# Patient Record
Sex: Female | Born: 1980 | Race: White | Hispanic: No | Marital: Married
Health system: Southern US, Academic
[De-identification: ages and names within clinical notes are randomized; demographics above are authoritative.]

## PROBLEM LIST (undated history)

## (undated) ENCOUNTER — Encounter

## (undated) ENCOUNTER — Encounter: Attending: Nephrology | Primary: Nephrology

## (undated) ENCOUNTER — Ambulatory Visit

## (undated) ENCOUNTER — Telehealth

## (undated) ENCOUNTER — Telehealth: Attending: Ambulatory Care | Primary: Ambulatory Care

## (undated) ENCOUNTER — Encounter: Attending: Physician Assistant | Primary: Physician Assistant

## (undated) ENCOUNTER — Ambulatory Visit: Payer: MEDICARE

## (undated) ENCOUNTER — Ambulatory Visit: Payer: MEDICARE | Attending: Surgery | Primary: Surgery

## (undated) ENCOUNTER — Ambulatory Visit: Payer: MEDICARE | Attending: Nephrology | Primary: Nephrology

## (undated) ENCOUNTER — Telehealth: Attending: Nephrology | Primary: Nephrology

## (undated) ENCOUNTER — Telehealth: Attending: Registered" | Primary: Registered"

## (undated) ENCOUNTER — Ambulatory Visit: Payer: MEDICARE | Attending: Infectious Disease | Primary: Infectious Disease

## (undated) ENCOUNTER — Other Ambulatory Visit: Payer: MEDICARE

## (undated) ENCOUNTER — Ambulatory Visit: Payer: MEDICARE | Attending: Registered" | Primary: Registered"

## (undated) ENCOUNTER — Ambulatory Visit: Payer: MEDICARE | Attending: Urology | Primary: Urology

## (undated) DIAGNOSIS — N289 Disorder of kidney and ureter, unspecified: Secondary | ICD-10-CM

## (undated) DIAGNOSIS — H5440 Blindness, one eye, unspecified eye: Secondary | ICD-10-CM

## (undated) DIAGNOSIS — E079 Disorder of thyroid, unspecified: Secondary | ICD-10-CM

## (undated) HISTORY — PX: NEPHRECTOMY TRANSPLANTED ORGAN: SUR880

## (undated) HISTORY — PX: KIDNEY TRANSPLANT: SHX239

## (undated) HISTORY — PX: AV FISTULA PLACEMENT: SHX1204

## (undated) MED ORDER — PREDNISONE 2.5 MG TABLET: 5 mg | tablet | Freq: Every day | 11 refills | 30 days | Status: CN

## (undated) MED ORDER — PREDNISONE 2.5 MG TABLET: 5 mg | tablet | Freq: Every day | 11 refills | 30 days

## (undated) MED ORDER — ASCORBIC ACID (VITAMIN C) 500 MG TABLET: Freq: Every day | ORAL | 0 days

## (undated) MED ORDER — PRAVASTATIN 20 MG TABLET: Freq: Every day | ORAL | 0 days | Status: SS

---

## 2012-02-15 ENCOUNTER — Ambulatory Visit: Payer: Self-pay | Admitting: Family Medicine

## 2017-06-12 ENCOUNTER — Ambulatory Visit
Admission: RE | Admit: 2017-06-12 | Discharge: 2017-06-12 | Disposition: A | Discharge status: Home / Self Care | Payer: MEDICARE

## 2017-06-12 ENCOUNTER — Ambulatory Visit: Admission: RE | Admit: 2017-06-12 | Discharge: 2017-06-12 | Disposition: A | Discharge status: Home / Self Care

## 2017-06-12 DIAGNOSIS — N186 End stage renal disease: Secondary | ICD-10-CM

## 2017-06-12 DIAGNOSIS — Z7289 Other problems related to lifestyle: Secondary | ICD-10-CM

## 2017-06-12 DIAGNOSIS — Z01810 Encounter for preprocedural cardiovascular examination: Secondary | ICD-10-CM

## 2017-06-12 DIAGNOSIS — Z01818 Encounter for other preprocedural examination: Principal | ICD-10-CM

## 2017-10-27 ENCOUNTER — Ambulatory Visit (INDEPENDENT_AMBULATORY_CARE_PROVIDER_SITE_OTHER): Payer: 59

## 2017-10-27 ENCOUNTER — Encounter: Payer: Self-pay | Admitting: *Deleted

## 2017-10-27 ENCOUNTER — Ambulatory Visit
Admission: EM | Admit: 2017-10-27 | Discharge: 2017-10-27 | Disposition: A | Discharge status: Home / Self Care | Payer: 59 | Attending: Family Medicine | Admitting: Family Medicine

## 2017-10-27 ENCOUNTER — Other Ambulatory Visit: Payer: Self-pay

## 2017-10-27 DIAGNOSIS — S82831A Other fracture of upper and lower end of right fibula, initial encounter for closed fracture: Secondary | ICD-10-CM | POA: Diagnosis not present

## 2017-10-27 HISTORY — DX: Disorder of thyroid, unspecified: E07.9

## 2017-10-27 HISTORY — DX: Blindness, one eye, unspecified eye: H54.40

## 2017-10-27 HISTORY — DX: Disorder of kidney and ureter, unspecified: N28.9

## 2017-10-27 MED ORDER — HYDROCODONE-ACETAMINOPHEN 5-325 MG PO TABS: ORAL_TABLET | ORAL | 0 refills | Status: DC

## 2017-10-27 MED ORDER — ACETAMINOPHEN 500 MG PO TABS
1000.0000 mg | ORAL_TABLET | Freq: Once | ORAL | Status: AC
  Administered 2017-10-27: 1000 mg via ORAL

## 2017-10-27 NOTE — ED Provider Notes (Signed)
[facility]    CSN: [id number] Arrival date & time: 10/27/17  1756     History   Chief Complaint Chief Complaint  Patient presents with  . Ankle Pain    HPI Julie Camacho is a 37 y.o. female.   37 yo female with a c/o right ankle pain and swelling after twisting it when she tried to stand up. States can't put any weight on it now. 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   The history is provided by the patient.  Ankle Pain    Past Medical History:  Diagnosis Date  . Blind left eye   . Renal disorder   . Thyroid disease     There are no active problems to display for this patient.   Past Surgical History:  Procedure Laterality Date  . AV FISTULA PLACEMENT    . CESAREAN SECTION    . NEPHRECTOMY TRANSPLANTED ORGAN      OB History    No data available       Home Medications    Prior to Admission medications   Medication Sig Start Date End Date Taking? Authorizing Provider  calcitRIOL (ROCALTROL) 0.5 MCG capsule Take 0.5 mcg by mouth every other day.   Yes [provider]  cinacalcet (SENSIPAR) 60 MG tablet Take 60 mg by mouth daily.   Yes [provider]  epoetin alfa (EPOGEN,PROCRIT) [id number] UNIT/ML injection Inject 20,000 Units into the skin 3 (three) times a week.   Yes [provider]  heparin 6,000 Units in sodium chloride 0.9 % 500 mL Irrigate with 1 application as directed once.   Yes [provider]  midodrine (PROAMATINE) 5 MG tablet Take 5 mg by mouth 3 (three) times daily with meals.   Yes [provider]  mupirocin ointment (BACTROBAN) 2 % Place 1 application into the nose 2 (two) times daily.   Yes [provider]  omeprazole (PRILOSEC OTC) 20 MG tablet Take 20 mg by mouth daily.   Yes [provider]  sevelamer carbonate (RENVELA) 800 MG tablet Take 1,600 mg by mouth 3 (three) times daily with meals.   Yes [provider]  Thiamine HCl ([facility]  VITAMIN B-1 PO) Take by mouth.   Yes [provider]  HYDROcodone-acetaminophen (NORCO/VICODIN) 5-325 MG tablet 1-2 tabs po q 8 hours prn 10/27/17   Payton Mccallumonty, Fredrick [place], MD    Family History History reviewed. No pertinent family history.  Social History Social History   Tobacco Use  . Smoking status: Never Smoker  . Smokeless tobacco: Never Used  Substance Use Topics  . Alcohol use: No    Frequency: Never  . Drug use: No     Allergies   Demerol [meperidine] and Nsaids   Review of Systems Review of Systems   Physical Exam Triage Vital Signs ED Triage Vitals  Enc Vitals Group     BP --      Pulse Rate 10/27/17 1823 (!) 115     Resp 10/27/17 1823 16     Temp 10/27/17 1823 98 F (36.7 C)     Temp Source 10/27/17 1823 Oral     SpO2 10/27/17 1823 98 %     Weight 10/27/17 1825 169 lb 12.1 oz (77 kg)     Height 10/27/17 1825 5\' 4"  (1.626 m)     Head Circumference --      Peak Flow --      Pain Score 10/27/17 1826 5  Pain Loc --      Pain Edu? --      Excl. in GC? --    No data found.  Updated Vital Signs Pulse (!) 115   Temp 98 F (36.7 C) (Oral)   Resp 16   Ht 5\' 4"  (1.626 m)   Wt 169 lb 12.1 oz (77 kg)   LMP 10/13/2017   SpO2 98%   BMI 29.14 kg/m   Visual Acuity Right Eye Distance:   Left Eye Distance:   Bilateral Distance:    Right Eye Near:   Left Eye Near:    Bilateral Near:     Physical Exam  Constitutional: She appears well-developed and well-nourished.  Musculoskeletal: She exhibits edema.       Right ankle: She exhibits decreased range of motion and swelling. She exhibits no ecchymosis, no deformity, no laceration and normal pulse. Tenderness. Lateral malleolus and AITFL tenderness found. No CF ligament, no posterior TFL, no head of 5th metatarsal and no proximal fibula tenderness found. Achilles tendon normal.  Neurological: She is alert.  Skin: Skin is warm and dry. No rash noted. She is not diaphoretic.  Nursing note and vitals  reviewed.    UC Treatments / Results  Labs (all labs ordered are listed, but only abnormal results are displayed) Labs Reviewed - No data to display  EKG  EKG Interpretation None       Radiology Dg Ankle Complete Right  Result Date: 10/27/2017 CLINICAL DATA:  Status post twisting injury.  Pain. EXAM: RIGHT ANKLE - COMPLETE 3+ VIEW COMPARISON:  None. FINDINGS: Nondisplaced fracture of the lateral malleolus with severe overlying soft tissue swelling. No other fracture or dislocation. Intact ankle mortise. IMPRESSION: 1. Acute nondisplaced fracture of the lateral malleolus with severe overlying soft tissue swelling. Electronically Signed   By: Elige Ko   On: 10/27/2017 19:11    Procedures Procedures (including critical care time)  Medications Ordered in UC Medications  acetaminophen (TYLENOL) tablet 1,000 mg (1,000 mg Oral Given 10/27/17 1848)     Initial Impression / Assessment and Plan / UC Course  I have reviewed the triage vital signs and the nursing notes.  Pertinent labs & imaging results that were available during my care of the patient were reviewed by me and considered in my medical decision making (see chart for details).       Final Clinical Impressions(s) / UC Diagnoses   Final diagnoses:  Closed traumatic nondisplaced fracture of distal end of right fibula, initial encounter    ED Discharge Orders        Ordered    HYDROcodone-acetaminophen (NORCO/VICODIN) 5-325 MG tablet     10/27/17 1940      1. x-ray results and diagnosis reviewed with patient  2. rx as per orders above; reviewed possible side effects, interactions, risks and benefits  3. Splint applied for immobilization; walker for support as patient can not use crutches due to AV fistulas in arm 4. Recommend supportive treatment with rest, elevation 5. Follow up with orthopedist next week   Controlled Substance Prescriptions [place] Controlled Substance Registry consulted? Not Applicable     Payton Mccallum, MD 10/27/17 2047

## 2017-10-27 NOTE — ED Triage Notes (Signed)
Patient injured her right ankle today at 1545 when she tried to stand up to quickly.

## 2018-01-30 ENCOUNTER — Encounter: Admit: 2018-01-30 | Discharge: 2018-01-30 | Payer: MEDICARE | Attending: Surgery | Primary: Surgery

## 2018-02-05 DIAGNOSIS — Z01818 Encounter for other preprocedural examination: Secondary | ICD-10-CM

## 2018-02-05 DIAGNOSIS — Z7682 Awaiting organ transplant status: Principal | ICD-10-CM

## 2018-02-05 DIAGNOSIS — N186 End stage renal disease: Secondary | ICD-10-CM

## 2018-03-27 ENCOUNTER — Ambulatory Visit: Admit: 2018-03-27 | Discharge: 2018-03-28 | Disposition: A | Discharge status: Home / Self Care | Payer: MEDICARE

## 2018-04-30 ENCOUNTER — Ambulatory Visit: Admit: 2018-04-30 | Discharge: 2018-04-30 | Payer: MEDICARE

## 2018-04-30 ENCOUNTER — Ambulatory Visit: Admit: 2018-04-30 | Discharge: 2018-05-13 | Payer: MEDICARE

## 2018-04-30 DIAGNOSIS — Z01810 Encounter for preprocedural cardiovascular examination: Secondary | ICD-10-CM

## 2018-04-30 DIAGNOSIS — Z7682 Awaiting organ transplant status: Secondary | ICD-10-CM

## 2018-04-30 DIAGNOSIS — N186 End stage renal disease: Secondary | ICD-10-CM

## 2018-04-30 DIAGNOSIS — Z01818 Encounter for other preprocedural examination: Principal | ICD-10-CM

## 2018-04-30 DIAGNOSIS — R93429 Abnormal radiologic findings on diagnostic imaging of unspecified kidney: Secondary | ICD-10-CM

## 2018-04-30 DIAGNOSIS — Z7289 Other problems related to lifestyle: Secondary | ICD-10-CM

## 2018-05-14 ENCOUNTER — Ambulatory Visit: Admit: 2018-05-14 | Discharge: 2018-05-14 | Payer: MEDICARE

## 2018-05-14 ENCOUNTER — Institutional Professional Consult (permissible substitution): Admit: 2018-05-14 | Discharge: 2018-05-15 | Payer: MEDICARE

## 2018-05-14 ENCOUNTER — Institutional Professional Consult (permissible substitution): Admit: 2018-05-14 | Discharge: 2018-05-14 | Payer: MEDICARE

## 2018-05-14 DIAGNOSIS — Z01810 Encounter for preprocedural cardiovascular examination: Secondary | ICD-10-CM

## 2018-05-14 DIAGNOSIS — N186 End stage renal disease: Secondary | ICD-10-CM

## 2018-05-14 DIAGNOSIS — R93429 Abnormal radiologic findings on diagnostic imaging of unspecified kidney: Secondary | ICD-10-CM

## 2018-05-14 DIAGNOSIS — Z01818 Encounter for other preprocedural examination: Principal | ICD-10-CM

## 2018-05-14 DIAGNOSIS — Z7682 Awaiting organ transplant status: Secondary | ICD-10-CM

## 2018-05-14 DIAGNOSIS — Z7289 Other problems related to lifestyle: Secondary | ICD-10-CM

## 2018-06-28 ENCOUNTER — Other Ambulatory Visit: Payer: Self-pay | Admitting: Acute Care

## 2018-06-28 DIAGNOSIS — R29810 Facial weakness: Secondary | ICD-10-CM

## 2018-07-11 ENCOUNTER — Ambulatory Visit
Admission: RE | Admit: 2018-07-11 | Discharge: 2018-07-11 | Disposition: A | Discharge status: Home / Self Care | Payer: Medicare Other | Source: Ambulatory Visit | Attending: Acute Care | Admitting: Acute Care

## 2018-07-11 ENCOUNTER — Encounter (INDEPENDENT_AMBULATORY_CARE_PROVIDER_SITE_OTHER): Payer: Self-pay

## 2018-07-11 DIAGNOSIS — J320 Chronic maxillary sinusitis: Secondary | ICD-10-CM | POA: Diagnosis not present

## 2018-07-11 DIAGNOSIS — R29810 Facial weakness: Secondary | ICD-10-CM | POA: Insufficient documentation

## 2018-07-11 DIAGNOSIS — B9689 Other specified bacterial agents as the cause of diseases classified elsewhere: Secondary | ICD-10-CM | POA: Diagnosis not present

## 2018-10-12 ENCOUNTER — Other Ambulatory Visit: Payer: Self-pay

## 2018-10-12 ENCOUNTER — Ambulatory Visit
Admission: EM | Admit: 2018-10-12 | Discharge: 2018-10-12 | Disposition: A | Discharge status: Home / Self Care | Payer: Medicare Other | Attending: Family Medicine | Admitting: Family Medicine

## 2018-10-12 DIAGNOSIS — L089 Local infection of the skin and subcutaneous tissue, unspecified: Secondary | ICD-10-CM | POA: Insufficient documentation

## 2018-10-12 MED ORDER — MUPIROCIN 2 % EX OINT: 1.0000 "application " | TOPICAL_OINTMENT | Freq: Two times a day (BID) | CUTANEOUS | 0 refills | Status: AC

## 2018-10-12 MED ORDER — DOXYCYCLINE HYCLATE 100 MG PO CAPS: 100.0000 mg | ORAL_CAPSULE | Freq: Two times a day (BID) | ORAL | 0 refills | Status: AC

## 2018-10-12 NOTE — ED Provider Notes (Signed)
[facility]    CSN: [id number] Arrival date & time: 10/12/18  1634  History   Chief Complaint Chief Complaint  Patient presents with  . Cellulitis   HPI  37 year old female presents with a suspected finger infection.  Patient states that she suffered a paper cut approximately 1 to 2 months ago (Right ring finger). She states that she was doing fine until she injured the area on 12/10.  She states that she saw her PCP on 12/13 and was doing okay.  She states that now it appears to be infected.  It is red and tender.  There appears to be pus underneath the skin.  Mild pain, 3/10.  No known exacerbating or relieving factors.  No medications or interventions tried.  No other complaints.  History reviewed and updated as below.  Past Medical History:  Diagnosis Date  . Blind left eye   . Renal disorder   . Thyroid disease    Past Surgical History:  Procedure Laterality Date  . AV FISTULA PLACEMENT    . CESAREAN SECTION    . NEPHRECTOMY TRANSPLANTED ORGAN      OB History   No obstetric history on file.      Home Medications    Prior to Admission medications   Medication Sig Start Date End Date Taking? Authorizing Provider  calcitRIOL (ROCALTROL) 0.5 MCG capsule Take 0.5 mcg by mouth every other day.    [provider]  cinacalcet (SENSIPAR) 60 MG tablet Take 60 mg by mouth daily.    [provider]  doxycycline (VIBRAMYCIN) 100 MG capsule Take 1 capsule (100 mg total) by mouth 2 (two) times daily. 10/12/18   Tommie Samsook, Stephaine Breshears G, DO  epoetin alfa (EPOGEN,PROCRIT) [id number] UNIT/ML injection Inject 20,000 Units into the skin 3 (three) times a week.    [provider]  heparin 6,000 Units in sodium chloride 0.9 % 500 mL Irrigate with 1 application as directed once.    [provider]  midodrine (PROAMATINE) 5 MG tablet Take 5 mg by mouth 3 (three) times daily with meals.    [provider]  mupirocin ointment (BACTROBAN) 2 % Apply  1 application topically 2 (two) times daily for 7 days. 10/12/18 10/19/18  Tommie Samsook, Prudie Guthridge G, DO  omeprazole (PRILOSEC OTC) 20 MG tablet Take 20 mg by mouth daily.    [provider]  sevelamer carbonate (RENVELA) 800 MG tablet Take 1,600 mg by mouth 3 (three) times daily with meals.    [provider]  Thiamine HCl ([facility]S VITAMIN B-1 PO) Take by mouth.    [provider]   Social History Social History   Tobacco Use  . Smoking status: Never Smoker  . Smokeless tobacco: Never Used  Substance Use Topics  . Alcohol use: No    Frequency: Never  . Drug use: No     Allergies   Cefazolin; Demerol [meperidine]; Nsaids; Tolmetin; Cephalosporins; and Tape   Review of Systems Review of Systems  Constitutional: Negative for fever.  Skin:       Finger infection.   Physical Exam Triage Vital Signs ED Triage Vitals [10/12/18 1649]  Enc Vitals Group     BP (!) 98/56     Pulse Rate 98     Resp 18     Temp 98.1 F (36.7 C)     Temp Source Oral     SpO2 100 %     Weight 160 lb 15 oz (73 kg)  Height 5\' 4"  (1.626 m)     Head Circumference      Peak Flow      Pain Score 3     Pain Loc      Pain Edu?      Excl. in GC?    Updated Vital Signs BP (!) 98/56 (BP Location: Right Arm)   Pulse 98   Temp 98.1 F (36.7 C) (Oral)   Resp 18   Ht 5\' 4"  (1.626 m)   Wt 73 kg   LMP 09/24/2018   SpO2 100%   BMI 27.62 kg/m   Visual Acuity Right Eye Distance:   Left Eye Distance:   Bilateral Distance:    Right Eye Near:   Left Eye Near:    Bilateral Near:     Physical Exam Vitals signs and nursing note reviewed.  Constitutional:      General: She is not in acute distress. HENT:     Head: Normocephalic and atraumatic.     Nose: Nose normal.  Eyes:     General: No scleral icterus.    Conjunctiva/sclera: Conjunctivae normal.  Pulmonary:     Effort: Pulmonary effort is normal. No respiratory distress.  Skin:    Comments: Right 4th digit -patient with  erythema and collection of pus just proximal to the nailbed on the radial aspect.  Neurological:     Mental Status: She is alert.  Psychiatric:        Mood and Affect: Mood normal.        Behavior: Behavior normal.    UC Treatments / Results  Labs (all labs ordered are listed, but only abnormal results are displayed) Labs Reviewed - No data to display  EKG None  Radiology No results found.  Procedures Procedures (including critical care time) Needle aspiration/drainage:  Area was cleansed with Betadine. Ethyl chloride spray used for topical anesthesia.  27-gauge needle was used to puncture the pus collection.  Small amount of pus was drained.  Patient tolerated procedure well.  No complications.  Medications Ordered in UC Medications - No data to display  Initial Impression / Assessment and Plan / UC Course  I have reviewed the triage vital signs and the nursing notes.  Pertinent labs & imaging results that were available during my care of the patient were reviewed by me and considered in my medical decision making (see chart for details).    37 year old female presents with a finger infection.  Drainage was performed today.  Placing on Bactroban and doxycycline  Final Clinical Impressions(s) / UC Diagnoses   Final diagnoses:  Finger infection     Discharge Instructions     Medication as prescribed.  Take care  Dr. Adriana Simasook    ED Prescriptions    Medication Sig Dispense Auth. Provider   doxycycline (VIBRAMYCIN) 100 MG capsule Take 1 capsule (100 mg total) by mouth 2 (two) times daily. 14 capsule Teka Chanda G, DO   mupirocin ointment (BACTROBAN) 2 % Apply 1 application topically 2 (two) times daily for 7 days. 22 g Tommie Samsook, Halleigh Comes G, DO     Controlled Substance Prescriptions  Controlled Substance Registry consulted? Not Applicable   Tommie SamsCook, Nehemyah Foushee G, DO 10/12/18 1929

## 2018-10-12 NOTE — ED Triage Notes (Signed)
Pt reports a paper cut a month or two ago to right ring finger. Now feels it is infected. Pain 3/10

## 2018-10-12 NOTE — Discharge Instructions (Signed)
Medication as prescribed.  Take care  Dr. Xaivier Malay  

## 2018-12-04 DIAGNOSIS — Z7682 Awaiting organ transplant status: Secondary | ICD-10-CM

## 2018-12-04 DIAGNOSIS — N186 End stage renal disease: Principal | ICD-10-CM

## 2018-12-04 DIAGNOSIS — Z01818 Encounter for other preprocedural examination: Principal | ICD-10-CM

## 2018-12-05 ENCOUNTER — Encounter
Admit: 2018-12-05 | Discharge: 2018-12-11 | Disposition: A | Discharge status: Home / Self Care | Payer: MEDICARE | Admitting: Transplant Surgery

## 2018-12-05 ENCOUNTER — Encounter
Admit: 2018-12-05 | Discharge: 2018-12-05 | Payer: MEDICARE | Attending: Transplant Surgery | Primary: Transplant Surgery

## 2018-12-05 ENCOUNTER — Encounter
Admit: 2018-12-05 | Discharge: 2018-12-11 | Disposition: A | Discharge status: Home / Self Care | Payer: MEDICARE | Attending: Surgery | Admitting: Transplant Surgery

## 2018-12-05 ENCOUNTER — Ambulatory Visit
Admit: 2018-12-05 | Discharge: 2018-12-11 | Disposition: A | Discharge status: Home / Self Care | Payer: MEDICARE | Admitting: Transplant Surgery

## 2018-12-05 DIAGNOSIS — N186 End stage renal disease: Principal | ICD-10-CM

## 2018-12-06 DIAGNOSIS — N186 End stage renal disease: Principal | ICD-10-CM

## 2018-12-06 DIAGNOSIS — Z940 Kidney transplant status: Principal | ICD-10-CM

## 2018-12-06 LAB — BASIC METABOLIC PANEL
ANION GAP: 15 mmol/L (ref 7–15)
ANION GAP: 21 mmol/L — ABNORMAL HIGH (ref 7–15)
ANION GAP: 20 mmol/L — ABNORMAL HIGH (ref 7–15)
ANION GAP: 19 mmol/L — ABNORMAL HIGH (ref 7–15)
BLOOD UREA NITROGEN: 32 mg/dL — ABNORMAL HIGH (ref 7–21)
BLOOD UREA NITROGEN: 31 mg/dL — ABNORMAL HIGH (ref 7–21)
BUN / CREAT RATIO: 4
BUN / CREAT RATIO: 4
BUN / CREAT RATIO: 5
CALCIUM: 8.7 mg/dL (ref 8.5–10.2)
CALCIUM: 9.6 mg/dL (ref 8.5–10.2)
CALCIUM: 9.6 mg/dL (ref 8.5–10.2)
CHLORIDE: 96 mmol/L — ABNORMAL LOW (ref 98–107)
CHLORIDE: 94 mmol/L — ABNORMAL LOW (ref 98–107)
CHLORIDE: 94 mmol/L — ABNORMAL LOW (ref 98–107)
CHLORIDE: 95 mmol/L — ABNORMAL LOW (ref 98–107)
CO2: 25 mmol/L (ref 22.0–30.0)
CO2: 22 mmol/L (ref 22.0–30.0)
CO2: 20 mmol/L — ABNORMAL LOW (ref 22.0–30.0)
CREATININE: 8.57 mg/dL — ABNORMAL HIGH (ref 0.60–1.00)
CREATININE: 8.91 mg/dL — ABNORMAL HIGH (ref 0.60–1.00)
CREATININE: 9.74 mg/dL — ABNORMAL HIGH (ref 0.60–1.00)
CREATININE: 9.65 mg/dL — ABNORMAL HIGH (ref 0.60–1.00)
EGFR CKD-EPI AA FEMALE: 6 mL/min/{1.73_m2} — ABNORMAL LOW (ref >=60)
EGFR CKD-EPI AA FEMALE: 6 mL/min/{1.73_m2} — ABNORMAL LOW (ref >=60)
EGFR CKD-EPI AA FEMALE: 5 mL/min/{1.73_m2} — ABNORMAL LOW (ref >=60)
EGFR CKD-EPI AA FEMALE: 5 mL/min/{1.73_m2} — ABNORMAL LOW (ref >=60)
EGFR CKD-EPI NON-AA FEMALE: 5 mL/min/{1.73_m2} — ABNORMAL LOW (ref >=60)
EGFR CKD-EPI NON-AA FEMALE: 5 mL/min/{1.73_m2} — ABNORMAL LOW (ref >=60)
EGFR CKD-EPI NON-AA FEMALE: 5 mL/min/{1.73_m2} — ABNORMAL LOW (ref >=60)
GLUCOSE RANDOM: 147 mg/dL (ref 70–179)
GLUCOSE RANDOM: 149 mg/dL (ref 70–179)
GLUCOSE RANDOM: 127 mg/dL (ref 70–179)
POTASSIUM: 3.7 mmol/L (ref 3.5–5.0)
POTASSIUM: 3.9 mmol/L (ref 3.5–5.0)
POTASSIUM: 5 mmol/L (ref 3.5–5.0)
POTASSIUM: 5 mmol/L (ref 3.5–5.0)
SODIUM: 136 mmol/L (ref 135–145)
SODIUM: 136 mmol/L (ref 135–145)
SODIUM: 134 mmol/L — ABNORMAL LOW (ref 135–145)

## 2018-12-06 LAB — CBC
HEMATOCRIT: 27 % — ABNORMAL LOW (ref 36.0–46.0)
HEMATOCRIT: 31.1 % — ABNORMAL LOW (ref 36.0–46.0)
HEMOGLOBIN: 8.6 g/dL — ABNORMAL LOW (ref 12.0–16.0)
HEMOGLOBIN: 9.8 g/dL — ABNORMAL LOW (ref 12.0–16.0)
MEAN CORPUSCULAR HEMOGLOBIN CONC: 32 g/dL (ref 31.0–37.0)
MEAN CORPUSCULAR HEMOGLOBIN CONC: 31.5 g/dL (ref 31.0–37.0)
MEAN CORPUSCULAR HEMOGLOBIN: 30.5 pg (ref 26.0–34.0)
MEAN CORPUSCULAR HEMOGLOBIN: 29.7 pg (ref 26.0–34.0)
MEAN CORPUSCULAR VOLUME: 95.2 fL (ref 80.0–100.0)
MEAN CORPUSCULAR VOLUME: 94.2 fL (ref 80.0–100.0)
MEAN PLATELET VOLUME: 8.1 fL (ref 7.0–10.0)
MEAN PLATELET VOLUME: 7.6 fL (ref 7.0–10.0)
PLATELET COUNT: 217 10*9/L (ref 150–440)
PLATELET COUNT: 288 10*9/L (ref 150–440)
RED CELL DISTRIBUTION WIDTH: 16.1 % — ABNORMAL HIGH (ref 12.0–15.0)
RED CELL DISTRIBUTION WIDTH: 16.4 % — ABNORMAL HIGH (ref 12.0–15.0)
WBC ADJUSTED: 26.1 10*9/L — ABNORMAL HIGH (ref 4.5–11.0)
WBC ADJUSTED: 30.4 10*9/L — ABNORMAL HIGH (ref 4.5–11.0)

## 2018-12-06 LAB — RED BLOOD CELL COUNT: Lab: 3.3 — ABNORMAL LOW

## 2018-12-06 LAB — MAGNESIUM
Magnesium:MCnc:Pt:Ser/Plas:Qn:: 1.8
Magnesium:MCnc:Pt:Ser/Plas:Qn:: 2

## 2018-12-06 LAB — BUN / CREAT RATIO: Urea nitrogen/Creatinine:MRto:Pt:Ser/Plas:Qn:: 4

## 2018-12-06 LAB — TACROLIMUS, TROUGH
Lab: <1 — ABNORMAL LOW
Lab: <1 — ABNORMAL LOW

## 2018-12-06 LAB — HEMOGLOBIN A1C: ESTIMATED AVERAGE GLUCOSE: 100 mg/dL

## 2018-12-06 LAB — EGFR CKD-EPI NON-AA FEMALE
Lab: 5 — ABNORMAL LOW
Lab: 5 — ABNORMAL LOW

## 2018-12-06 LAB — PHOSPHORUS
Phosphate:MCnc:Pt:Ser/Plas:Qn:: 4.7
Phosphate:MCnc:Pt:Ser/Plas:Qn:: 6.2 — ABNORMAL HIGH

## 2018-12-06 LAB — CALCIUM IONIZED VENOUS (MG/DL)
Calcium.ionized:MCnc:Pt:Bld:Qn:: 4.44
Calcium.ionized:MCnc:Pt:Bld:Qn:: 4.56
Calcium.ionized:MCnc:Pt:Bld:Qn:: 4.59

## 2018-12-06 LAB — ESTIMATED AVERAGE GLUCOSE: Estimated average glucose:MCnc:Pt:Bld:Qn:Estimated from glycated hemoglobin: 100

## 2018-12-06 LAB — MEAN PLATELET VOLUME: Lab: 8.1

## 2018-12-06 LAB — CHLORIDE: Chloride:SCnc:Pt:Ser/Plas:Qn:: 94 — ABNORMAL LOW

## 2018-12-06 MED ORDER — VALGANCICLOVIR 450 MG TABLET
ORAL_TABLET | Freq: Every day | ORAL | 2 refills | 0 days | Status: CP
Start: 2018-12-06 — End: 2018-12-18
  Filled 2018-12-11: qty 30, 30d supply, fill #0

## 2018-12-06 MED ORDER — MYFORTIC 180 MG TABLET,DELAYED RELEASE
ORAL_TABLET | Freq: Two times a day (BID) | ORAL | 11 refills | 0.00000 days | Status: CP
Start: 2018-12-06 — End: 2019-01-03
  Filled 2018-12-11: qty 180, 30d supply, fill #0

## 2018-12-06 MED ORDER — PROGRAF 1 MG CAPSULE
ORAL_CAPSULE | Freq: Two times a day (BID) | ORAL | 11 refills | 0.00000 days | Status: CP
Start: 2018-12-06 — End: 2018-12-10

## 2018-12-06 NOTE — Unmapped (Signed)
Brief Operative Note  (CSN: [id number])      Date of Surgery: 12/05/2018    Pre-op Diagnosis: ESRD    Post-op Diagnosis: ESRD     Procedure(s):  RENAL ALLOTRANSPLANTATION, IMPLANTATION OF GRAFT; WITHOUT RECIPIENT NEPHRECTOMY: 50360 (CPT??)  BACKBNCH STD PREP CAD DONR RENAL ALLOGFT PRIOR TO TRNSPLNT, INCL DISSEC/REM PERINEPH FAT, DIAPH/RTPER ATTAC: 98119 (CPT??)  Note: Revisions to procedures should be made in chart - see Procedures activity.    Performing Service: Transplant  Surgeon(s) and Role:     * Bufford Lope, MD - Primary     * Leona Carry, MD - Assisting     * Princess Perna, MD - Resident - Assisting    Assistant: None    Findings: Successful implantation of R Donor Kidney into L Common Iliac Artery and Vein.     Anesthesia: General    Estimated Blood Loss: 100 mL    Complications: None    Specimens:   ID Type Source Tests Collected by Time Destination   1 : /MICHELS SOLUTION Biopsy Kidney, Right SURGICAL PATHOLOGY Lynnell Chad, MD 12/05/2018 2012    2 : ZERO HOUR KIDNY BX/FORMALIN/RIGHT DONOR KIDNEY Biopsy Kidney, Right SURGICAL PATHOLOGY Lynnell Chad, MD 12/05/2018 2016        Implants:   Implant Name Type Inv. Item Serial No. Manufacturer Lot No. LRB No. Used   STENT URETERAL DOUBLE J 6.0 FR/16 CM - [id number] Graft STENT URETERAL DOUBLE J 6.0 FR/16 CM  [employer] [id number] 1       Surgeon Notes: I was present and scrubbed for the entire procedure    Princess Perna   Date: 12/05/2018  Time: 10:04 PM

## 2018-12-06 NOTE — Unmapped (Signed)
Called dialysis center to let them know that patient received kidney transplant at [facility]. We do not know if the patient will require dialysis at this time and will keep them updated. Caryl Ada Inpatient Transplant Nurse Coordinator 12/06/2018 1:52 PM

## 2018-12-06 NOTE — Unmapped (Signed)
Adult Nutrition Assessment Note    Visit Type: RN Consult  Reason for Visit: Per Admission Nutrition Screen (Adult), Have you had a decrease in food intake or appetite?    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 She denies food allergies and intolerances. She does endorse having moderate acid reflux, which she took medication for prior to admission     Nutritionally Pertinent Meds: methylprednisolone, pepcid, Zofran PRN     Labs: reviewed     Abd/GI: no BM since PTA     Skin: see below   Patient Lines/Drains/Airways Status    Active Wounds     Name:   Placement date:   Placement time:   Site:   Days:    Surgical Site 12/05/18 Abdomen Left   12/05/18    1953     less than 1               Current nutrition therapy order:   Nutrition Orders          Nutrition Therapy Clear Liquid; Potassium Restricted (2 gm K) starting at 02/13 1007         Anthropometric Data:  -- Height: 162.6 cm  -- Last recorded weight: 76 kg   -- Admission weight: no admit weight   -- IBW: 120 lbs (54.5 kg)   -- Percent IBW: 139%  -- BMI: 28.7    -- Weight changes this admission:   Last 5 Recorded Weights    12/06/18 1135   Weight: 86 kg (189 lb 9.5 oz)      -- Weight history PTA: stable weight in June/July 2019, with 10 kg weight gain- which is likely partially related to fluid during surgery   Wt Readings from Last 10 Encounters:   12/06/18 86 kg (189 lb 9.5 oz)   05/14/18 76 kg (167 lb 8 oz)   03/27/18 76 kg (167 lb 8.8 oz)   06/12/17 77.8 kg (171 lb 8 oz)   02/22/17 73.9 kg (163 lb)   01/22/17 74 kg (163 lb 2.3 oz)   03/14/16 76.8 kg (169 lb 4.8 oz)   02/23/15 73.8 kg (162 lb 9.6 oz)   06/06/13 71.1 kg (156 lb 12.8 oz)   06/06/13 71.1 kg (156 lb 12.8 oz)        Daily Estimated Nutrient Needs:   Energy: 1900- 2280 kcals [25- 30 kcal/kg using last [place] weight of 76 kg]  Protein: 90- 115 gm [1.2- 1.5 g/kg using using last [place] weight of 76 kg]  Fluid: per team     Nutrition Focused Physical Exam:  Fat Areas Examined  Orbital: No loss  Upper Arm: No loss      Muscle Areas Examined  Temple: No loss    DIAGNOSIS:  Malnutrition Assessment using AND/ASPEN Clinical Characteristics:  Patient does not meet AND/ASPEN criteria for malnutrition at this time (12/06/18 1638)    Overall nutrition impression: Pt seen s/p kidney transplant 2/12. Pt reports having significant pain in abdomen/lower esophagus d/t esophageal reflux. Pt reports that they skipped her normal reflux medication yesterday and she is paying for it now. Left pt with food safety booklet along with RD contact information and food drug interaction list for pt to review prior to  education.     RECOMMENDATIONS AND INTERVENTIONS:  Monitor NPO status, medical nutrition therapy advancement and tolerance  Will provide nutrition education post transplant food safety precautions     RD Follow Up Parameters:  1-2 times per week (and more frequent as indicated)       Lanelle Bal, RD, LDN  Abdominal Transplant Dietitian   Pager: [phone]

## 2018-12-06 NOTE — Unmapped (Addendum)
Kidney Transplant Pharmacy Progress Note  Date:  2018-12-27     Patient:  Kimberly [name], Age:  38 y.o. old, Gender:  female    Allergies:  Ancef [cefazolin]; Adhesive tape-silicones; and Demerol [meperidine]    S:  POD 1, s/p deceased kidney transplant on 12-27-2018, 2/2 congenital/obstructive hydronephrosis   s/p 3 failed kidney txp (1 clotted 1995, 2 rejected 2000 + 2003, 2 removed, remaining on R),   O:  Home medications:  Medications Prior to Admission   Medication Sig Note Dispense Refill Last Dose   ??? ascorbic acid, vitamin C, (VITAMIN C) 250 MG tablet Take 250 mg by mouth daily. 12/05/2018: Taking (added)       ??? calcitriol (ROCALTROL) 0.25 MCG capsule Take 0.25 mcg by mouth daily. 12/05/2018: Taking    Taking   ??? calcium carbonate 400 mg calcium (1,000 mg) Chew Chew 2,000 mg Three (3) times a day with a meal. 12/05/2018: Not taking-NO LONGER TAKING   Not Taking at Unknown time   ??? carboxymethylcellulose sodium (REFRESH CELLUVISC) 1 % DpGe Instill drops in affected eye(s) as directed as needed. 12/05/2018: Taking (added) as needed-NO FREQUENCY REPORTED      ??? cinacalcet (SENSIPAR) 30 MG tablet Take 30 mg by mouth daily.  12/05/2018: Taking    Taking   ??? doxycycline (ADOXA) 100 MG tablet 100mg  BID (Patient not taking: Reported on 06/12/2017) 12/05/2018: Not taking-NO LONGER TAKING 60 tablet 1 Not Taking at Unknown time   ??? epoetin alfa (EPOGEN) 20,000 unit/mL injection 14,000 Units 3 (three) times a week. (on MWF at dialysis) 12/05/2018: Taking    Taking   ??? HEPARIN SODIUM,PORCINE/NS/PF (HEPARIN, PORCINE,) 1,000 unit/500 mL infusion Frequency:PHARMDIR   Dosage:5000   UNITS  Instructions:  Note:5000units with dialysis tx x6 days per week Dose: 5000UNITS 12/05/2018: GIVEN IN DIALYSIS   Taking   ??? hypromellose (GONAK) 2.5 % ophthalmic solution Apply to eye continuous as needed.       ??? lanthanum (FOSRENOL) 1000 MG chewable tablet Chew 1,000 mg Three (3) times a day with a meal. Frequency:TIDCC   Dosage:1000   MG Instructions:  Note:Dose: 1000MG  12/05/2018: Not taking-DISCONTINUED & RENVELA PRESCRIBED   Taking   ??? midodrine (PROAMATINE) 5 MG tablet Take 10 mg by mouth Three (3) times a day.  12/05/2018: Taking    Taking   ??? mupirocin (BACTROBAN) 2 % ointment Apply to affected open, crusted sores BID until resolved. 12/05/2018: Taking as needed for sores 30 g 2 Taking   ??? omeprazole (PRILOSEC) 40 MG capsule Take 40 mg by mouth daily. 12/05/2018: Taking    Taking   ??? ranitidine (ZANTAC) 150 MG tablet Take 150 mg by mouth once as needed. Frequency:PRN   Dosage:150   MG  Instructions:  Note:Dose: 150MG  12/05/2018: Not taking-NO LONGER TAKES   Not Taking at Unknown time   ??? sevelamer (RENVELA) 800 mg tablet Take 3,200 mg by mouth Three (3) times a day with a meal. 12/05/2018: Taking    Taking   ??? VIT B CMPLX NO3/FA/C/BIOT/ZINC (VIT B CMPLEX 3-FA-C-BIOT-ZN OX ORAL) Take by mouth once daily. Frequency:QD   Dosage:0.0     Instructions:  Note:Dose: 1 TAB 12/05/2018: Taking    Taking     Scheduled Medications:  famotidine, 20 mg, Daily  insulin regular, 0-12 Units, ACHS  methylPREDNISolone sodium succinate, 250 mg, Once    Followed by  [START ON 12/07/2018] methylPREDNISolone sodium succinate, 125 mg, Q24H  midodrine, 10 mg, TID  mycophenolate, 750 mg, BID  nystatin, 10 mL, Q8H SCH  pantoprazole, 40 mg, Daily  tacrolimus, 4 mg, BID  [START ON 12/08/2018] valGANciclovir, 450 mg, Tue,Sat      Continuous IV Fluids/Drips:  DOPamine      PRN Medications:  acetaminophen, 650 mg, Q6H PRN  HYDROmorphone, 0.5 mg, Q4H PRN    Or  HYDROmorphone, 1 mg, Q4H PRN  hypromellose, 1 drop, 4x Daily PRN  ondansetron, 4 mg, Q8H PRN        Hematology:   Recent Labs     12/05/18  0857 12/06/18  0409   WBC 9.9 26.1*     Chemistry:   Recent Labs     12/05/18  2215 12/06/18  0213 12/06/18  0409   BUN 30* 32* 31*   CREATININE 8.12* 8.57* 8.91*     Intake/Output:     Intake/Output Summary (Last 24 hours) at 12/06/2018 1239  Last data filed at 12/06/2018 1200  Gross per 24 hour   Intake 6032.92 ml   Output 230 ml   Net 5802.92 ml       1. Induction Immunosuppression:  Alemtuzumab.  Steroid Taper:  POD0: methylprednisolone 500 mg IV intraoperatively  POD1: methylprednisolone 250 mg IV  POD2: methylprednisolone 125 mg IV  POD3: methylprednisolone 125 mg IV     2. Prophylactic Immunosuppression:  Dual therapy with CellCept 750 mg PO BID and Prograf 4 mg PO BID.  Goal Prograf: 8-10 ng/mL    3. Prophylactic Antimicrobials:   CMV: Moderate risk for CMV D+/R+. Patient on Valcyte 450 mg PO twice weekly. Anticipate 3 months of prophylaxis per transplant protocol. End date: 03/06/19.  PJP: SMZ/TMP SS PO on Monday, Wednesday, and Friday. Anticipate 6 months of prophylaxis per transplant protocol. End date:  06/06/19  Nystatin suspension while inpatient    4. Renal:  Estimated eCrCl ~ 9 mL/min. SCr UOP x, K+  Renal US:    5. Pain:  Fentanyl PCA; gabapentin 100 mg nightly x 14 days, APAP 1 gm TID x 5 days then PRN, tramadol PRN    6. GI:  Docusate 100 mg PO BID and Miralax PRN.    7. PPX: DVT SQH 5,000 units q8hr, famotidine 20 mg daily or pantoprazole 40 mg PO daily, holding aspirin 81 mg PO daily at this time    Medication Reconciliation: Completed; Home medications not restarted: vit C, calcitriol, calcium carbonate, cinacalcet. Epogen, lanthanum, sevelamer    Spent 5 minutes completing medication therapy management, medication reconciliation, and discussing care with members of the multidisciplinary transplant team.    Thank you,  Ria Bush, PharmD

## 2018-12-06 NOTE — Unmapped (Signed)
First Assistant/ Co Surgeon note at the Attending Level  ??  Pre-op Diagnosis: esrd  ??  Post-op Diagnosis:??same  ??  Anesthesia: General  ??  Estimated Blood Loss: 100 mL  ??  Complications:??none  ??  Specimens: none  ?? ?? ?? ?? ?? ?? ??   ?? ?? ?? ?? ?? ?? ??   ?? ?? ?? ?? ?? ?? ??   ??  ??  Procedure(s) Performed: ??  1. Backbench LDD procurement,??CPT 5032_  ??  2. Renal allotransplant: CPT 50360  right??kidney ??  ??  3.  Cystotomy, with insertion of ureteral catheter or stent CPT 51045  ??  4. Ureteroneocystostomy CPT [id number]  ??  5. Insertion of indwelling stent: CPT 50605  ??  Teaching Surgeon:??A Raelene Bott, M.D.??  ??  COsurgeon:??A Norma Fredrickson, M.D.   There was no resident with adequate level of skills available for this case. Please add code .82.  I specifically helped perform the vascular anastomosi- the vein and both arteries. This case required 2 attending surgeons due to it being her 4th transplant and 2 arteries and short renal vein.   ??  ??  Procedure Details:  ??  The patient was identified in the holding area and risks and benefits of the procedure were reviewed with the patient.  ??  The patient was brought into the operating room and placed in supine position on the OR table. Prior to proceeding, a complete time out was taken and recorded in OpTime to include identification of the patient, identification of the procedure, identification of the operative site, presence of all required radiographs and/or equipment, and the timely administration of appropriate antibiotics.   ??  After the induction of GET anesthesia, lines were placed by the anesthesiologist. The urinary bladder was catheterized and irrigated with antibiotic solution. ??Sequential compression boots were used as were Bair Huggers. The abdomen was prepped and draped in the usual sterile fashion.  ??  The donor renal graft was prepared on the back table. ??The donor anatomy was 2 artery,single vein, one ureter. Right kidney.Vein had to be cut off the IVC cuff as it was incomplete from donor surgery.  ??  Lower midline incision to expose the left iliac vessels.  ??  The kidney was brought to the OR table. The patient received??no??heparin IV. Venous control was obtained by partially occluding the common iliac vein with a vascular clamp. ??A venotomy was made, the vein was irrigated, and an anastomosis was created with 6-0 Prolene. Arterial control was subsequently obtained with arterial clamps. Arteriotomy was fashioned by??4-0 punch??and the artery irrigated, and an anastomosis was created with 6-0 Prolene. This was done x 2.  ??  The Kidney vasculature was sequentially unclamped and reperfused without complications. lasix given. Positioned laterally and bladder anastomosis performed over a stent with 6-0 prolene suture.  Please see Dr . Verta Ellen note for further details.   ??  I specifically helped perform all anastomosi.  ??  DR Raelene Bott continued with the procedure  ??  Attestation:  I was present and scrubbed for the key portions of the procedure and A qualified resident physician was not available

## 2018-12-06 NOTE — Unmapped (Addendum)
Pt alert and oriented x4. Afebrile. NSR. Low BP. Systolic BP in 70s ??? 90s and diastolic BP in 30s ??? 50s. MD aware and OK with low BP if MAP greater than 55. Albumin given and STAT CBC drew. Pain managed PRN Tylenol x1. Pt remains on room air. Foley in place; no UOP; MD notified multiple times.  MD instruct to hold on 1:1 replacement and to notify any UOP. Bladder scan 0 mL @ 0715. MD instructed to forward flush 10cc. No BM. JPx1 with moderate bloody drainage. SCD on bilaterally. No new skin breakdown noted. Will continue to monitor patient's vital signs, pain level, I+Os    Problem: Wound  Goal: Optimal Wound Healing  Outcome: Progressing     Problem: Adult Inpatient Plan of Care  Goal: Plan of Care Review  Outcome: Progressing  Flowsheets (Taken 12/06/2018 0743)  Progress: improving  Plan of Care Reviewed With: patient  Goal: Patient-Specific Goal (Individualization)  Outcome: Progressing  Flowsheets (Taken 12/06/2018 0743)  Patient-Specific Goals (Include Timeframe): During hospitalization, pt's pain level will be less than 6 out of 10  Individualized Care Needs: Assessment, pain meds, reposition  Goal: Absence of Hospital-Acquired Illness or Injury  Outcome: Progressing  Intervention: Identify and Manage Fall Risk  Flowsheets (Taken 12/06/2018 0743)  Safety Interventions:  ??? aspiration precautions  ??? fall reduction program maintained  ??? low bed  Intervention: Prevent Skin Injury  Flowsheets (Taken 12/06/2018 0743)  Pressure Reduction Techniques:  ??? frequent weight shift encouraged  ??? weight shift assistance provided  Intervention: Prevent VTE (venous thromboembolism)  Flowsheets (Taken 12/06/2018 0743)  VTE Prevention/Management:  ??? bleeding precautions maintained  ??? bleeding risk factors identified  Intervention: Prevent Infection  Flowsheets (Taken 12/06/2018 0743)  Infection Prevention: handwashing promoted  Goal: Optimal Comfort and Wellbeing  Outcome: Progressing  Intervention: Monitor Pain and Promote Comfort Flowsheets (Taken 12/06/2018 0743)  Pain Management Interventions:  ??? position adjusted  ??? pillow support provided  Intervention: Provide Person-Centered Care  Flowsheets (Taken 12/06/2018 0743)  Trust Relationship/Rapport:  ??? care explained  ??? questions answered  Goal: Readiness for Transition of Care  Outcome: Progressing  Intervention: Mutually Develop Transition Plan  Flowsheets (Taken 12/06/2018 [phone])  Concerns to be Addressed: adjustment to diagnosis/illness  Goal: Rounds/Family Conference  Outcome: Progressing     Problem: Self-Care Deficit  Goal: Improved Ability to Complete Activities of Daily Living  Outcome: Progressing     Problem: Skin Injury Risk Increased  Goal: Skin Health and Integrity  Outcome: Progressing     Problem: Venous Thromboembolism  Goal: VTE (Venous Thromboembolism) Symptom Resolution  Outcome: Progressing     Problem: Pain Acute  Goal: Optimal Pain Control  Outcome: Progressing     Problem: Bleeding (Surgery Nonspecified)  Goal: Absence of Bleeding  Outcome: Progressing     Problem: Infection (Surgery Nonspecified)  Goal: Absence of Infection Signs/Symptoms  Outcome: Progressing     Problem: Pain (Surgery Nonspecified)  Goal: Acceptable Pain Control  Outcome: Progressing

## 2018-12-06 NOTE — Unmapped (Signed)
SURGERY PROGRESS NOTES      Assessment/Plan:     [name] is a 38 y.o. 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  ??  -- Dopamine infusion 18mcg/hr for renal perfusion  -- Plan for HD 2/14 if continued absent graft function  -- CLD, medlock  -- Resume home midodrine for ionotropic support  -- Renal transplant ultrasound   - POD 0 [place] = good perfusion  -- Start transplant meds (tacrolimus, valganciclovir, nystatin, cellcept)  -- Continue Methylprednisolone taper  -- Trend ionized calcium  -- SQH    Subjective:  Patient hypotensive to the 80s systolics post-op, however patient states she typically runs in the 90s systolics at home and is asymptomatic. No urine output, foley flushed without urine return, no fluid in bladder on scan.     Objective:    Vital signs in last 24 hours:  Temp:  [36.6 ??C (97.9 ??F)-37.2 ??C (99 ??F)] 36.7 ??C (98.1 ??F)  Heart Rate:  [90-118] 101  SpO2 Pulse:  [90-118] 91  Resp:  [12-26] 21  BP: (65-100)/(38-54) 87/53  MAP (mmHg):  [45-66] 61  A BP-1: (78)/(38) 78/38  MAP:  [41 mmHg] 41 mmHg  SpO2:  [89 %-100 %] 98 %    Intake/Output last 24 hours:  I/O last 3 completed shifts:  In: 5782.9 [I.V.:5282.9; IV Piggyback:500]  Out: 170 [Drains:70; Blood:100]    Physical Exam:    General Appearance:   Patient lying in bed, in no acute distress  Lungs:                Clear to auscultation bilaterally  Heart:                           Regular rate and rhythm  Abdomen:                Soft, non-tender, non-distended, surgical incision C/D/I with staples. JP drain with minimal ss output  GU:   Foley in place, flushes easily, no output  Extremities:              Warm and well perfused  Psych:    Mood and affect appropriate  Neuro:     Speech and memory intact, no focal deficits    Data Review:    I have reviewed the labs and studies from the last 24 hours.      Imaging: Radiology studies were personally reviewed    Delaine Lame, MS3

## 2018-12-06 NOTE — Unmapped (Signed)
Pre op dx- ESRD, Prior failed kidney transplant three times.  ??  Procedure(s) Performed: ?? Deceased??donor??Renal allotransplant:??Right Kidney????  ??  4. Ureteroneocystostomy CPT [id number]  ??  5. Insertion of indwelling stent: CPT 81191  ??  ??  Primary: Bufford Lope, MD  Co surgeon: Katherine Roan, MD  Assistant:??Princess Perna, MD  ??  Specimens:????kidney biopsy post reperfusion  ??  Estimated Blood Loss: ??100??cc????  ??  Backbench procurement:?? Bufford Lope, MD  During the preparation for General Surgery by Anesthesia, back table procurement was performed.????Donor blood type and UNOS ID and laterality confirmed. Kidney was inspected. excessive amount of retroperitoneal fat tissue was removed. The renal vein and renal artery were dissected from the retroperitoneal fat tissue down to the ??proximity of the hilum. Venous reconstructions:??No  Arterial reconstructions: no, 2 arteries.  The right kidney had a very short renal vein (1 cm)??and the there was no adequate IVC cuff to allow reconstruction of the same.  There were 2 donor arteries in contrast to Donor surgeon detailing of single renal artery. Lower pole artery was very small (2.13mm). Considering the vein was short, I did not have the option of using the aortic cuff on the artery, and had to cut artery shorter to perform arterial anastomosis.Lymphatic vessels were ligated.  ??  ??  Renal allotransplant:??Bufford Lope,??MD  The patient was taken back to the operating room, placed on the operating table in the supine position. General anesthesia with endotracheal intubation was started. Foley catheter??was placed.????The abdomen was prepped and draped in a sterile fashion. This was an extremely challenging case considering her prior laparotomies and failed 3 prior kidney transplants. Lower midline incision with extension to above umbilicus was done. Meticulous adhesiolysis was done. Bookwalter retractor was used to expose. The descending colon was medially mobilized. There were no anatomical planes due to multiple prior surgeries. The field was oozy due to neovascularization. The left gonadal vein was suture ligated to allow visualisation of common iliac vessels. The left common iliac artery and vein were dissected free of surrounding tissue to prepare implantation site. Satinsky clamp was applied on the left common iliac vein. Venotomy was performed with 11 blade knove and Potts scissors.????Renal vein was sutured end-to-side applying two corner sutures of 5-0 Prolene, running technique to accomplish the anastomosis. Subsequently, two straight????Pediatric Cooley clamps were applied on the ??proximal and distal left common iliac artery. 2 separate arteriotomies was performed and sutured end-to-side using 6-0 Prolene stitch. Also running technique was applied. After completion of the anastomosis??Satinsky clamp was removed first. ??The Cooley clamps were removed from the  iliac artery.  Kidney re-perfused very well, had very good turgor and was ??  pulsating. We controlled bleeding from the capsular vessels.??The patient had very soft blood pressures preoperative and intraoperatively necessitating pressors. While performing the ureter anastomisis we noted the kidney slightly underperfused. The renal vessels appeared to be spastic. We applied papaverine on vessel and the kidney colour was noted to be pink gradually. Good doppler signals were noted over the kidneyThe kidney was positioned??medially.     Kidney biopsy was performed after reperfusion. 5-0 prolene stitch was placed at biopsy site to control capsule bleeding.       Ureteroneocystostomy??and insertion of the stent: ??Bufford Lope??MD  Urinary bladder was irrigated with antibiotic containing solution. The muscularis of the bladder was transected with cautery. Bulging mucosa was entered with an 11 ??  blade knife. Prior to that the donor???s ureter was shortened, mesentery of the ureter was ligated with 3-0  silk stitch. Ureter was spatulated. Two corners of the ureter were sutured with a 6-0 PDS stitch. Bladder mucosa to ureteral mucosa. A double-J 6 French??12??stent was placed through the anastomosis site and placed into the renal pelvis of the collecting system on one side and into the bladder on the other side through the anastomosis. Running technique was applied to approximate left and right side of the uretero-neo-cystostomy. Surgicell and surgiflow was applied.  ??  The abdomen was irrigated with warm saline.????JP????drain was placed through the separate incision.??Midline fascia??was closed from two corners with #1 PDS sutures. SQ tissue was irrigated with warm saline.????Skin approximated with stapler.????Dressing was applied on JP drain exit site and staples line. The patient was taken to??PACU extubated in stable condition.  ??  I was present and scrubbed for the entire procedure, I performed the procedure and??A??qualified resident physician was not available  ??  Right deceased??donor kidney (short vessels, multiple arteries)  Sodumedrol, Campath on induction.  Midline incision, Transplantation on??left side??  Kidney made no??urine post reperfusion.  ??  ??  Bufford Lope, MD

## 2018-12-07 DIAGNOSIS — N186 End stage renal disease: Principal | ICD-10-CM

## 2018-12-07 LAB — B CHANNEL SHIFT2: Lab: 1

## 2018-12-07 LAB — CBC
HEMATOCRIT: 33.9 % — ABNORMAL LOW (ref 36.0–46.0)
MEAN CORPUSCULAR HEMOGLOBIN CONC: 31.8 g/dL (ref 31.0–37.0)
MEAN CORPUSCULAR HEMOGLOBIN: 29.7 pg (ref 26.0–34.0)
MEAN CORPUSCULAR VOLUME: 93.3 fL (ref 80.0–100.0)
MEAN PLATELET VOLUME: 7.3 fL (ref 7.0–10.0)
RED BLOOD CELL COUNT: 3.63 10*12/L — ABNORMAL LOW (ref 4.00–5.20)
RED CELL DISTRIBUTION WIDTH: 16.5 % — ABNORMAL HIGH (ref 12.0–15.0)
WBC ADJUSTED: 30.6 10*9/L — ABNORMAL HIGH (ref 4.5–11.0)

## 2018-12-07 LAB — HLA FLOW CROSSMATCH RECIPIENT
B CHANNEL SHIFT1: 10
B CHANNEL SHIFT3: 6
FLOW B CELL #1: NEGATIVE
FLOW B CELL #2: NEGATIVE
FLOW B CELL #3: NEGATIVE
FLOW T CELL #1: NEGATIVE
FLOW T CELL #2: NEGATIVE
FLOW T CELL #3: NEGATIVE
T CHANNEL SHIFT1: 14
T CHANNEL SHIFT2: 9
T CHANNEL SHIFT3: 17

## 2018-12-07 LAB — BLOOD UREA NITROGEN: Urea nitrogen:MCnc:Pt:Ser/Plas:Qn:: 34 — ABNORMAL HIGH

## 2018-12-07 LAB — BASIC METABOLIC PANEL
ANION GAP: 20 mmol/L — ABNORMAL HIGH (ref 7–15)
BLOOD UREA NITROGEN: 34 mg/dL — ABNORMAL HIGH (ref 7–21)
BUN / CREAT RATIO: 4
CALCIUM: 9.9 mg/dL (ref 8.5–10.2)
CHLORIDE: 93 mmol/L — ABNORMAL LOW (ref 98–107)
CO2: 22 mmol/L (ref 22.0–30.0)
CREATININE: 7.75 mg/dL — ABNORMAL HIGH (ref 0.60–1.00)
EGFR CKD-EPI AA FEMALE: 7 mL/min/{1.73_m2} — ABNORMAL LOW (ref >=60)
EGFR CKD-EPI NON-AA FEMALE: 6 mL/min/{1.73_m2} — ABNORMAL LOW (ref >=60)
POTASSIUM: 4.7 mmol/L (ref 3.5–5.0)

## 2018-12-07 LAB — VARICELLA ZOSTER IGG: Varicella zoster virus Ab.IgG:PrThr:Pt:Ser:Ord:: POSITIVE

## 2018-12-07 LAB — CMV IGM: Lab: NEGATIVE

## 2018-12-07 LAB — TACROLIMUS, TROUGH: Lab: 29.9

## 2018-12-07 LAB — PHOSPHORUS: Phosphate:MCnc:Pt:Ser/Plas:Qn:: 5.8 — ABNORMAL HIGH

## 2018-12-07 LAB — CALCIUM IONIZED VENOUS (MG/DL): Calcium.ionized:MCnc:Pt:Bld:Qn:: 4.82

## 2018-12-07 LAB — HEMOGLOBIN: Lab: 10.8 — ABNORMAL LOW

## 2018-12-07 LAB — EPSTEIN-BARR VCA IGG ANTIBODY: Lab: POSITIVE — AB

## 2018-12-07 LAB — MAGNESIUM: Magnesium:MCnc:Pt:Ser/Plas:Qn:: 2

## 2018-12-07 LAB — HERPES SIMPLEX VIRUS 2 IGG: Lab: NEGATIVE

## 2018-12-07 LAB — HSV ANTIBODIES, IGG: HERPES SIMPLEX VIRUS 1 IGG: NEGATIVE

## 2018-12-07 LAB — CMV IGG: Lab: POSITIVE — AB

## 2018-12-07 NOTE — Unmapped (Signed)
Paged in regards to patient's worsening resp status. CXR reviewed, worsening pulm edema as compared to earlier cxr today. Decreasing o2 sat now to low 90's. Remains oliguric. Currently on dopamine to assist with renal perfusion.  Will do 1 hrs dry UF for 1.5L and 1 hr HD for 1 L. Will assess for further dialysis needs in AM.  Discussed with primary team.    Anthony Sar, MD  Nephrology Fellow  [facility] Department of Nephrology & Hypertension

## 2018-12-07 NOTE — Unmapped (Signed)
[facility] TEACHING ROUNDS    An interdisciplinary care conference was held today and included the following team members: Heidi Dach, MD Transplant Surgeon, [facility] Surgery Resident , Drake Leach, [place] Transplant Physician Assistant, [place]ston Neighbor, RN Transplant Nurse Coordinator, Toy Care, PharmD Transplant Pharmacist, Geannie Risen, LCSW Transplant Case Manager, Lisbeth Ply, MD Transplant Nephrologist and Ernestine Conrad, MD Transplant Nephrology Fellow .    Per team, patient with minimal UOP, repeating chest x-ray today, once blood pressure management adequate without dopamine, anticipate transfer to the floor.     Discharge Plan: D/c needs TBD; patient anticipated to remain inpatient through the weekend.     Evelena Asa, LCSW  Transplant Case Manager  [facility]

## 2018-12-07 NOTE — Unmapped (Signed)
PHYSICAL THERAPY        Patient Name:  Kimberly [name]       Medical Record Number: [id number]   Date of Birth: [birth year]  Sex: Female            Treatment Diagnosis: decreased activity tolerance    ASSESSMENT  Problem List: Decreased endurance;Decreased mobility     Assessment : Kimberly [name] is a 38 y.o. 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 Pt presents to PT services with decreased endurance requiring frequent rest breaks during ambulation. Pt would benefit from ongoing acute and 3x post acute PT services to address functional limitations. After a review of the personal factors, comorbidities, clinical presentation, and examination of the number of affected body systems, the patient presents as a moderate  complexity case.     Today's Interventions: PT Eval, transfers, gait training, pt/family education re: PT role, POC, activity pacing strategies, transplant precautions                          PLAN  Planned Frequency of Treatment:  1x per day for: 4-5x week      Planned Interventions: Diaphragmatic / Pursed-lip breathing;Education - Patient;Education - Family / caregiver;Endurance activities;Functional mobility;Home exercise program;Gait training;Self-care / Home training;Stair training;Therapeutic activity;Transfer training;Therapeutic exercise    Post-Discharge Physical Therapy Recommendations:  3x weekly    PT DME Recommendations: None(has RW if needed)           Goals:   Patient and Family Goals: to be able to walk further and not need oxygen    Long Term Goal #1: Pt will score 24/24 on AMPAC in 4 weeks       SHORT GOAL #1: Pt will perform all functional transfers mod IND              Time Frame : 2 weeks  SHORT GOAL #2: Pt will ambulate 150' mod IND with LRAD              Time Frame : 2 weeks  SHORT GOAL #3: Pt will negotiate 5 steps CGA with LRAD               Time Frame : 2 weeks                                        Prognosis:  Good     Barriers to Discharge: Endurance deficits    SUBJECTIVE  Patient reports: Pt/RN agreeable to PT. I feel a lot better than last night, but my breathing still isn't great.  Current Functional Status: Session began and ended with pt sitting up in recliner, all needs in reach, Husband at bedside, RN aware of outcomes/vitals     Prior Functional Status: Pt was an independent community ambulator, no use of AD (has RW from when she fractured her foot last year)  Equipment available at home: [employer]     Past Medical History:   Diagnosis Date   ??? Bell's palsy    ??? Disease of thyroid gland    ??? ESRD (end stage renal disease) (CMS-HCC)    ???  Nonarteritic ischemic optic neuropathy    ??? Reflux nephropathy     Social History     Tobacco Use   ??? Smoking status: Never Smoker   ??? Smokeless tobacco: Never Used   Substance Use Topics   ??? Alcohol use: No      Past Surgical History:   Procedure Laterality Date   ??? AV FISTULA PLACEMENT Left     Left arm   ??? NEPHRECTOMY Bilateral    ??? PARATHYROIDECTOMY Bilateral 2010   ??? SKIN BIOPSY     ??? SKIN BIOPSY Left     left hand    Family History   Problem Relation Age of Onset   ??? Diabetes Father    ??? Kidney disease Father    ??? Cancer Maternal Grandmother    ??? Cancer Maternal Grandfather    ??? Cancer Paternal Grandmother    ??? Cancer Paternal Grandfather    ??? 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, requiring HD overnight, new O2 use  Equipment / Environment: Supplemental oxygen;Vascular access (PIV, TLC, Port-a-cath, PICC);Telemetry;JP drain(s)(3L O2 via [place])    At Rest: VSS per tele on 3L O2 via [place] with spO2 98%  With Activity: pt SOB with ambulation requiring frequent standing rest breaks, spO2 88-95% on 3L, poor pleth   Orthostatics: asymptomatic       Living Situation  Living Environment: House  Lives With: Spouse  Home Living: Two level home;Stairs to alternate level with rails;Level entry  Number of Stairs: 10     Cognition: A&Ox4          UE ROM: WFL  UE Strength: WFL  LE ROM: WFL  LE Strength: WFL                       Sensation: denied parasthesias  Balance: sitting IND, static standing mod IND with rollator         Bed Mobility: not assessed, pt up in recliner  Transfers: sit <> stand SBA with rollator   Gait  Level of Assistance: Standby assist, set-up cues, supervision of patient - no hands on  Assistive Device: Four wheel walker  Distance Ambulated (ft): 100 ft  Gait: slow cadence, steady gait;  requiring ~4 standing rest breaks due shortness of breath.  Stairs: not assessed      Endurance: decreased from baseline with frequent rest breaks and new O2 requirements    Physical Therapy Session Duration  PT Individual - Duration: 30    Medical Staff Made Aware: RN Hector Brunswick    I attest that I have reviewed the above information.  Signed: Salome Holmes, PT  Filed 12/07/2018

## 2018-12-07 NOTE — Unmapped (Signed)
Tacrolimus Therapeutic Monitoring Pharmacy Note    Kimberly [name] is a 38 y.o. female continuing tacrolimus.     Indication: Kidney transplant     Date of Transplant: 12/05/18      Prior Dosing Information: Current regimen 4 mg BID      Goals:  Therapeutic Drug Levels  Tacrolimus trough goal: 8-10 ng/mL    Additional Clinical Monitoring/Outcomes  ?? Monitor renal function (SCr and urine output) and liver function (LFTs)  ?? Monitor for signs/symptoms of adverse events (e.g., hyperglycemia, hyperkalemia, hypomagnesemia, hypertension, headache, tremor)    Results:   Tacrolimus level: 29.9 ng/mL, drawn appropriately    Pharmacokinetic Considerations and Significant Drug Interactions:  ? Concurrent hepatotoxic medications: None identified  ? Concurrent CYP3A4 substrates/inhibitors: None identified  ? Concurrent nephrotoxic medications: Bactrim    Assessment/Plan:  Recommendedation(s)  ? Hold tacrolimus and will follow-up with tacrolimus levels    Follow-up  ? Daily tac levels prior to AM dose.   ? A pharmacist will continue to monitor and recommend levels as appropriate    Please page service pharmacist with questions/clarifications.    Vertis Kelch, PharmD

## 2018-12-07 NOTE — Unmapped (Signed)
Order was placed for a PIV by Venous Access Team (VAT).  Patient was assessed for placement of a PIV. Access was obtained. Blood return noted.  Dressing intact and device well secured.  Flushed with normal saline.  Pt advised to inform RN of any s/s of discomfort at the PIV site.    [address]: poor vein access, on dopamine drip, not a PICC candidate due to ESRD. Recommend a temporary line. Thx [place]ando VAT [id number]    Text paged Dr Izora Ribas of [facility].    Workup / Procedure Time:  30 minutes      Care RN was notified.       Thank you,     Gillie Manners RN Venous Access Team

## 2018-12-07 NOTE — Unmapped (Signed)
Social Work  Psychosocial Assessment    Patient Name: Kimberly [name]   Medical Record Number: [id number]   Date of Birth: Jun 10, 1981  Sex: Female     Referral  Referred by: Care Manager  Reason for Referral: Complex Discharge Planning    Extended Emergency Contact Information  Primary Emergency Contact: Kealey,Kevin  Address: [address]           [place], [name] [address] [place] Phone: [phone]  Relation: Spouse  Secondary Emergency Contact: Wonda Cerise, [place]  Home Phone: [phone]  Mobile Phone: [phone]  Relation: Relative    Legal Next of Kin / Guardian / POA / Advance Directives      Advance Directive (Medical Treatment)  Does patient have an advance directive covering medical treatment?: Patient would not like information.  Information provided on advance directive:: No  Patient requests assistance:: No    Discharge Planning  Discharge Planning Information:   Type of Residence   Mailing Address:  [address]  [place] [facility] [address]    Medical Information   Past Medical History:   Diagnosis Date   ??? Bell's palsy    ??? Disease of thyroid gland    ??? ESRD (end stage renal disease) (CMS-HCC)    ??? Nonarteritic ischemic optic neuropathy    ??? Reflux nephropathy        Past Surgical History:   Procedure Laterality Date   ??? AV FISTULA PLACEMENT Left     Left arm   ??? NEPHRECTOMY Bilateral    ??? PARATHYROIDECTOMY Bilateral 2010   ??? SKIN BIOPSY     ??? SKIN BIOPSY Left     left hand       Family History   Problem Relation Age of Onset   ??? Diabetes Father    ??? Kidney disease Father    ??? Cancer Maternal Grandmother    ??? Cancer Maternal Grandfather    ??? Cancer Paternal Grandmother    ??? Cancer Paternal Grandfather    ??? Kidney disease Brother        Network engineer Insurance: Payor: MEDICARE / Plan: MEDICARE PART A AND PART B / Product Type: *No Product type* /    Secondary Insurance: English as a second language teacher   Prescription Coverage: Medicare D     Preferred Pharmacy: [id number] - [place], [place] - [address]  [place] [facility]    Barriers to taking medication: No    Transition Home   Transportation at time of discharge: Family/Friend's Private Vehicle    Anticipated changes related to Illness: none   Services in place prior to admission: N/A   Services anticipated for DC: TBD   Hemodialysis Prior to Admission: Yes     Readmission  Risk of Unplanned Readmission Score: UNPLANNED READMISSION SCORE: 15%  Readmitted Within the Last 30 Days?   Readmission Factors include: current reason for admission unrelated to previous admission    Social Determinants of Health  Social Determinants of Health were addressed in provider documentation.  Please refer to patient history.    Social History  Support Systems: Spouse, Family Members            Hotel manager Service: No Field seismologist Affecting Healthcare: Lawyer and Psychiatric History  Psychosocial Stressors: Denies      Psychological  Issues/Information: Comment           Comment: Past history of depression  Chemical Dependency: None              Outpatient Providers: Specialist   Name / Contact #: : [facility]  Legal: No legal issues      Ability to [employer]: No issues accessing community services      **  Met patient at bedside. She presented tired. Husband and in-laws will be caregivers.    Will continue to follow for safe d/c.    Thomasene Mohair, LCSW, CCTSW  Transplant Social Worker/Case Manager  [facility]

## 2018-12-07 NOTE — Unmapped (Signed)
SURGERY PROGRESS NOTES      Assessment/Plan:     [name] is a 38 y.o. 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  ??  -- Stop dopamine infusion, increase midodrine to 15 TID  -- Nephrology to direct hemodialysis plan -- PRN versus schedule for 2/15.   - Will remain stepdown status if any concern about needing urgent HD overnight  -- Regular diet, medlock  -- Renal transplant ultrasound   - POD0 + POD1 [place] = good perfusion  -- Start transplant meds (tacrolimus, valganciclovir, bactrim, nystatin, cellcept)  -- Continue Methylprednisolone taper  -- Stop trending ionized calcium, start q6hr k+, all other labs continue daily  -- SQH, ASA    Subjective:  Patient required bedside HD last night with 2.5L removed after deterioration of respiratory status and evidence of overload on CXR. Significant improvement in clinical status after HD. Nephro will guide further dialysis based on clinical status. Still no urine output secondary to delayed graft function, anticipate further HD requirements. Significant leukocytosis without signs of infection, likely secondary to leukocyte demargination. Calcium level stable. Will transition off of dopamine infusion and advance diet.    Objective:    Vital signs in last 24 hours:  Temp:  [36.4 ??C-37 ??C] 36.4 ??C  Heart Rate:  [75-101] 97  SpO2 Pulse:  [87-111] 97  Resp:  [14-25] 21  BP: (81-128)/(52-80) 126/76  MAP (mmHg):  [61-95] 91  SpO2:  [90 %-99 %] 98 %  BMI (Calculated):  [32.43] 32.43    Intake/Output last 24 hours:  I/O last 3 completed shifts:  In: 4835.4 [P.O.:480; I.V.:3855.4; IV Piggyback:500]  Out: 3120 [Urine:55; Drains:465; Other:2500; Blood:100]    Physical Exam:    General Appearance:   Patient lying in bed, in no acute distress  Lungs:                Clear to auscultation bilaterally, increased breath sounds bilaterally  Heart:                           Regular rate and rhythm to sinus tachycardia, MAP adequate  Abdomen:                Soft, non-tender, non-distended, surgical incision C/D/I with staples. JP drain with ss output  GU:   Foley in place, flushes easily, no output  Extremities:              Warm and well perfused  Psych:    Mood and affect appropriate  Neuro:     Speech and memory intact, no focal deficits    Data Review:    I have reviewed the labs and studies from the last 24 hours.      Imaging: Radiology studies were personally reviewed

## 2018-12-07 NOTE — Unmapped (Signed)
Patient was A&O x4 throughout shift. Remains on 2L, unable to wean. Soft BP's noted since PACU. MD ordered dopamine to control BP and UOP. Scant urine output noted this shift. Foley remains in place. No BM this shift. JP drain moderate output throughout shift. Surgical site dressing remains intact. Protective precautions maintained. Clear liquid diet. Patient ambulated in hallway 3 laps this shift, tolerated fine. No complaints of pain or discomfort throughout shift. Will continue to monitor for any acute changes.      Problem: Adult Inpatient Plan of Care  Goal: Plan of Care Review  Outcome: Progressing  Goal: Patient-Specific Goal (Individualization)  Outcome: Progressing  Flowsheets (Taken 12/06/2018 1946)  Patient-Specific Goals (Include Timeframe): Patient will ambulate 3 laps around ISCU during this shift.  Individualized Care Needs: Ambulating, promoting nutrition  Goal: Absence of Hospital-Acquired Illness or Injury  Outcome: Progressing  Goal: Optimal Comfort and Wellbeing  Outcome: Progressing  Goal: Readiness for Transition of Care  Outcome: Progressing  Goal: Rounds/Family Conference  Outcome: Progressing     Problem: Self-Care Deficit  Goal: Improved Ability to Complete Activities of Daily Living  Outcome: Progressing     Problem: Skin Injury Risk Increased  Goal: Skin Health and Integrity  Outcome: Progressing     Problem: Pain Acute  Goal: Optimal Pain Control  Outcome: Progressing     Problem: Bleeding (Surgery Nonspecified)  Goal: Absence of Bleeding  Outcome: Progressing     Problem: Infection (Surgery Nonspecified)  Goal: Absence of Infection Signs/Symptoms  Outcome: Progressing     Problem: Pain (Surgery Nonspecified)  Goal: Acceptable Pain Control  Outcome: Progressing     Problem: Venous Thromboembolism  Goal: VTE (Venous Thromboembolism) Symptom Resolution  Outcome: Progressing

## 2018-12-07 NOTE — Unmapped (Addendum)
Transplant Event  Conversation with: Website (Name), Passport (Title)    Primary Insurance Co: Medicare (Ins Co Name), on 12/05/2018 (Date)  (Time)  Secondary Insurance Co: BCBS  (Ins Co Name), on 12/05/2018  (Date)  (Time)    [place] Event Authorization No (Primary): N/R  [place] Event Authorization No (Secondary): N/R (per Dorathy Daft. at [facility] [phone])    Donor Benefits Included and Verified:Yes    [place] Event Authorization Primary Phone Number: N/R  [place] Event Authorization Secondary Phone Number:

## 2018-12-07 NOTE — Unmapped (Signed)
HEMODIALYSIS NURSE PROCEDURE NOTE    Treatment Number:  1 Room/Station:  Critical Care (Specify Unit & Room)(ISCU 6405) Procedure Date:  12/07/18   Total Treatment Time:    Min.    CONSENT:  Written consent was obtained prior to the procedure and is detailed in the medical record. Prior to the start of the procedure, a time out was taken and the identity of the patient was confirmed via name, medical record number and date of birth.     WEIGHTS:  Hemodialysis Pre-Treatment Weights     Date/Time Pre-Treatment Weight (kg) Estimated Dry Weight (kg) Patient Goal Weight (kg) Total Goal Weight (kg)    12/07/18 0120  85.7 kg (188 lb 15 oz)  ??? tbd  2.5 kg (5 lb 8.2 oz)  3.05 kg (6 lb 11.6 oz)           Hemodialysis Post Treatment Weights     Date/Time Post-Treatment Weight (kg) Treatment Weight Change (kg)    12/07/18 0400  83.2 kg (183 lb 6.8 oz)  -2.51 kg        Active Dialysis Orders (168h ago, onward)     Start     Ordered    12/06/18 2326  Dialysis Schedule Order  (Dialysis ONCE)  Once      12/06/18 2331    12/06/18 2326  Hemodialysis inpatient  (Dialysis ONCE)  Once     Comments:  1 HOUR DUF (UF 1.5L) AND 1 HOUR HD (UF 1L UF)   Question Answer Comment   K+ 2 meq/L    Ca++ 2.5 meq/L    Bicarb Other (please specify) 30   Na+ 137 meq/L    Na+ Modeling none    Dialyzer F180NR    Dialysate Temperature (C) 36    BFR-As tolerated to a maximum of: 400 mL/min    DFR 800 mL/min    Duration of treatment 2 Hr    Dry weight (kg) tbd    Challenge dry weight (kg) none    Fluid removal (L) 2.5L, see comments    Tubing Adult = 142 ml    Access Site AVF    Access Site Location Other (please specify)    Keep SBP >: 80        12/06/18 2331              ACCESS SITE:             Arteriovenous Fistula - Vein Graft  Access Arteriovenous fistula Left;Upper Arm (Active)   Site Assessment Clean;Dry;Intact 12/07/2018 12:00 AM   AV Fistula Thrill Present;Bruit Present 12/07/2018 12:00 AM   Status Deaccessed 12/07/2018 12:00 AM   Dressing Status No dressing 12/07/2018 12:00 AM   Site Condition No complications 12/07/2018 12:00 AM   Dressing Open to air (None) 12/07/2018 12:00 AM       Patient Lines/Drains/Airways Status    Active Peripheral & Central Intravenous Access     Name:   Placement date:   Placement time:   Site:   Days:    Peripheral IV 12/05/18 Anterior;Proximal;Right Antecubital   12/05/18    0901    Antecubital   1    Peripheral IV 12/05/18 Right Forearm   12/05/18    1744    Forearm   1              LAB RESULTS:  Lab Results   Component Value Date    NA 134 (L) 12/06/2018    K 5.0 12/06/2018  CL 95 (L) 12/06/2018    CO2 20.0 (L) 12/06/2018    BUN 45 (H) 12/06/2018    CALCIUM 9.6 12/06/2018    CAION 4.59 12/06/2018    PHOS 6.2 (H) 12/06/2018    MG 2.0 12/06/2018    PTH 73.8 (H) 12/05/2018     Lab Results   Component Value Date    WBC 30.4 (H) 12/06/2018    HGB 9.8 (L) 12/06/2018    HCT 31.1 (L) 12/06/2018    PLT 288 12/06/2018    PHART 7.38 12/05/2018    PO2ART 68.9 (L) 12/05/2018    PCO2ART 42.8 12/05/2018    HCO3ART 25 12/05/2018    BEART 0.1 12/05/2018    O2SATART 92.7 (L) 12/05/2018    APTT 33.3 12/05/2018    HEPBSAG Negative 01/16/2015      VITAL SIGNS:  Temperature     Date/Time Temp Temp src      12/07/18 0345  37 ??C (98.6 ??F)  Oral     12/07/18 0130  37 ??C (98.6 ??F)  Oral         Hemodynamics     Date/Time Pulse BP MAP (mmHg) Patient Position    12/07/18 0400  91  81/53  ???  ???    12/07/18 0345  79  94/62  ???  ???    12/07/18 0330  84  101/62  ???  ???    12/07/18 0315  89  92/56  ???  ???    12/07/18 0300  82  94/64  ???  ???    12/07/18 0245  92  99/66  ???  ???    12/07/18 0230  89  124/77  ???  ???    12/07/18 0215  78  99/63  ???  ???    12/07/18 0200  91  120/77  ???  ???    12/07/18 0145  98  121/75  ???  ???    12/07/18 0130  99  126/80  ???  ???          Oxygen Therapy     Date/Time Resp SpO2 O2 Device FiO2 (%) O2 Flow Rate (L/min)    12/07/18 0400  20  99 %  ??? --  ???    12/07/18 0345  21  99 %  ??? --  ???    12/07/18 0330  21  98 %  ??? --  ???    12/07/18 0315  22  96 %  ??? --  ???    12/07/18 0300  21  ???  ??? --  ???    12/07/18 0245  20  ???  ??? --  ???    12/07/18 0230  22  97 %  ??? --  ???    12/07/18 0215  19  97 %  ??? --  ???    12/07/18 0200  23  95 %  ??? --  ???    12/07/18 0145  14  93 %  ??? --  ???    12/07/18 0130  23  92 %  ??? --  ???        Oxygen Connected to Wall:  yes    Pre-Hemodialysis Assessment     Date/Time Therapy Number Dialyzer All Machine Alarms Passed Air Detector Dialysis Flow (mL/min)    12/07/18 0120  1  F-180 (98 mLs)  Yes  Engaged  800 mL/min    Date/Time Verify Priming Solution Priming Volume Hemodialysis Independent pH Hemodialysis  Machine Conductivity (mS/cm) Hemodialysis Independent Conductivity (mS/cm)    12/07/18 0120  0.9% NS  250 mL  7.1  13.8 mS/cm  13.8 mS/cm    Date/Time Bicarb Conductivity Residual Bleach Negative Free Chlorine Total Chlorine Chloramine    12/07/18 0120 --  Yes --  0 --        Pre-Hemodialysis Treatment Comments     Date/Time Pre-Hemodialysis Comments    12/07/18 0120  Pt stable, SOB, Using 15g per pt request        Hemodialysis Treatment     Date/Time Blood Flow Rate (mL/min) Arterial Pressure (mmHg) Venous Pressure (mmHg) Transmembrane Pressure (mmHg)    12/07/18 0345  ???  ???  ???  ???    12/07/18 0330  400 mL/min  -170 mmHg  200 mmHg  10 mmHg    12/07/18 0315  400 mL/min  -170 mmHg  220 mmHg  10 mmHg    12/07/18 0300  400 mL/min  -170 mmHg  220 mmHg  10 mmHg    12/07/18 0245  400 mL/min  -170 mmHg  220 mmHg  10 mmHg    12/07/18 0230  400 mL/min  -170 mmHg  220 mmHg  10 mmHg    12/07/18 0215  400 mL/min  -170 mmHg  200 mmHg  10 mmHg    12/07/18 0200  400 mL/min  -170 mmHg  200 mmHg  10 mmHg    12/07/18 0145  200 mL/min  -80 mmHg  80 mmHg  50 mmHg    Date/Time Ultrafiltration Rate (mL/hr) Ultrafiltrate Removed (mL) Dialysate Flow Rate (mL/min) KECN Linna Caprice)    12/07/18 0345  ???  3056 mL  ???  ???    12/07/18 0330  1010 mL/hr  2860 mL  800 ml/min  ???    12/07/18 0315  1010 mL/hr  2749 mL  800 ml/min  ???    12/07/18 0300  1010 mL/hr  2335 mL  800 ml/min  ???    12/07/18 0245  1010 mL/hr  2108 mL  800 ml/min  ???    12/07/18 0230  1750 mL/hr  1622 mL  ???  ???    12/07/18 0215  1750 mL/hr  1167 mL  ??? duf,seq  ???    12/07/18 0200  1750 mL/hr  632 mL  ??? duf  ???    12/07/18 0145  1750 mL/hr  0 mL  ??? seq/duf  ???        Hemodialysis Treatment Comments     Date/Time Intra-Hemodialysis Comments    12/07/18 0345  Tx completed blood  rinse back    12/07/18 0330  pt stable    12/07/18 0315  pt stable    12/07/18 0300  pt stable,     12/07/18 0245  pt stable off Duf    12/07/18 0230  pt stable    12/07/18 0215  pt stable    12/07/18 0200  pt resting    12/07/18 0145  Tx initiated wiith 15g needles        Post Treatment     Date/Time Rinseback Volume (mL) On Line Clearance: spKt/V Total Liters Processed (L/min) Dialyzer Clearance    12/07/18 0400  300 mL  ???  3051 L/min  Lightly streaked        Post Hemodialysis Treatment Comments     Date/Time Post-Hemodialysis Comments    12/07/18 0400  Report given to [facility] RN        POST TREATMENT ASSESSMENT:  General appearance:  alert  Neurological:  Grossly normal  Lungs:  clear to auscultation bilaterally  Hearts:  regular rate and rhythm, S1, S2 normal, no murmur, click, rub or gallop  Abdomen:  soft, non-tender; bowel sounds normal; no masses,  no organomegaly  Pulses:  2+ and symmetric.  Skin:  Skin color, texture, turgor normal. No rashes or lesions    Hemodialysis I/O     Date/Time Total Hemodialysis Replacement Volume (mL) Total Ultrafiltrate Output (mL)    12/07/18 0400  ???  2500 mL        [id number] - Medicaitons Given During Treatment  (last 4 hrs)         ** No medications to display **

## 2018-12-07 NOTE — Unmapped (Signed)
Shift Summary 1900-0700: Pt alert and oriented x4. Pain minimal. Oxygen requirements increased from 2L to 5L in two hours. CXR and BMP completed. SRF MD notified. Pt currently on 3L . BP stable, on dopamine gtt. Tolerating well. Received dialysis this shift, BP dropped,returned to WNL once dialysis was completed. [place] dressing in place to abdomen. JP drainage to LLQ, serosanguinous drainage. UOP low. Will continue to monitor and assess.   Problem: Adult Inpatient Plan of Care  Goal: Plan of Care Review  Outcome: Progressing  Goal: Patient-Specific Goal (Individualization)  Outcome: Progressing  Goal: Absence of Hospital-Acquired Illness or Injury  Outcome: Progressing  Goal: Optimal Comfort and Wellbeing  Outcome: Progressing  Goal: Readiness for Transition of Care  Outcome: Progressing  Goal: Rounds/Family Conference  Outcome: Progressing     Problem: Self-Care Deficit  Goal: Improved Ability to Complete Activities of Daily Living  Outcome: Progressing     Problem: Skin Injury Risk Increased  Goal: Skin Health and Integrity  Outcome: Progressing     Problem: Pain Acute  Goal: Optimal Pain Control  Outcome: Progressing     Problem: Bleeding (Surgery Nonspecified)  Goal: Absence of Bleeding  Outcome: Progressing     Problem: Infection (Surgery Nonspecified)  Goal: Absence of Infection Signs/Symptoms  Outcome: Progressing     Problem: Pain (Surgery Nonspecified)  Goal: Acceptable Pain Control  Outcome: Progressing     Problem: Venous Thromboembolism  Goal: VTE (Venous Thromboembolism) Symptom Resolution  Outcome: Progressing

## 2018-12-07 NOTE — Unmapped (Signed)
Delisted Patient from [facility] due to Kidney Transplant  on 12/06/18 at 04:05pm.

## 2018-12-08 LAB — BASIC METABOLIC PANEL
BUN / CREAT RATIO: 6
CALCIUM: 9.7 mg/dL (ref 8.5–10.2)
CHLORIDE: 89 mmol/L — ABNORMAL LOW (ref 98–107)
CO2: 21 mmol/L — ABNORMAL LOW (ref 22.0–30.0)
CREATININE: 9.33 mg/dL — ABNORMAL HIGH (ref 0.60–1.00)
EGFR CKD-EPI AA FEMALE: 6 mL/min/{1.73_m2} — ABNORMAL LOW (ref >=60)
EGFR CKD-EPI NON-AA FEMALE: 5 mL/min/{1.73_m2} — ABNORMAL LOW (ref >=60)
GLUCOSE RANDOM: 122 mg/dL (ref 70–179)
POTASSIUM: 4.7 mmol/L (ref 3.5–5.0)
SODIUM: 131 mmol/L — ABNORMAL LOW (ref 135–145)

## 2018-12-08 LAB — CBC
HEMATOCRIT: 31.1 % — ABNORMAL LOW (ref 36.0–46.0)
HEMOGLOBIN: 10 g/dL — ABNORMAL LOW (ref 12.0–16.0)
MEAN CORPUSCULAR HEMOGLOBIN CONC: 32.2 g/dL (ref 31.0–37.0)
MEAN CORPUSCULAR VOLUME: 92.8 fL (ref 80.0–100.0)
MEAN PLATELET VOLUME: 7.8 fL (ref 7.0–10.0)
PLATELET COUNT: 283 10*9/L (ref 150–440)
RED BLOOD CELL COUNT: 3.36 10*12/L — ABNORMAL LOW (ref 4.00–5.20)
RED CELL DISTRIBUTION WIDTH: 16.1 % — ABNORMAL HIGH (ref 12.0–15.0)
WBC ADJUSTED: 17.9 10*9/L — ABNORMAL HIGH (ref 4.5–11.0)

## 2018-12-08 LAB — BLOOD UREA NITROGEN: Urea nitrogen:MCnc:Pt:Ser/Plas:Qn:: 57 — ABNORMAL HIGH

## 2018-12-08 LAB — TACROLIMUS, TROUGH: Lab: 14.1

## 2018-12-08 LAB — MAGNESIUM: Magnesium:MCnc:Pt:Ser/Plas:Qn:: 2.1

## 2018-12-08 LAB — PHOSPHORUS: Phosphate:MCnc:Pt:Ser/Plas:Qn:: 8 — ABNORMAL HIGH

## 2018-12-08 LAB — MEAN CORPUSCULAR HEMOGLOBIN CONC: Lab: 32.2

## 2018-12-08 NOTE — Unmapped (Signed)
SURGERY PROGRESS NOTES      Assessment/Plan:     Ms. Annistyn Depass is a 38 y.o. 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    -- Baseline hypotension to 85/45??   - Midodrine 15 TID, MAP goal >39mmHg   - Dopamine infusion @ 3-5 POD1 only   - PRN albumin infusions with HD  -- Delayed graft function, markedly oliguric   - Renal transplant ultrasound POD0, POD1 = adequate perfusion   - Scheduled hemodialysis T-Th-Sat until improved graft function  -- Start transplant meds (valganciclovir, bactrim, nystatin, cellcept)   - Tac trough was 29.9 after 2 x 4mg  doses, holding   - Continue daily tac trough draws, resume as indicated   - Continue Methylprednisolone taper, ends 2/15  -- Daily labs with tac trough  -- Medlock, regular diet, SSI, protonix  -- SQH, ASA    Subjective:  NAEO. Delayed graft function, continues to be markedly oliguric with <42mL UOP yesterday. Otherwise doing well, VSS, stable on 2L [place] for dyspnea with exertion only, had bowel movement. Per nephro, will have HD performed in-room today, remaining in ISCU pending HD. Resolution of palpitations and anxiety after stopping dopamine infusion.    Objective:    Vital signs in last 24 hours:  Temp:  [36.5 ??C-36.8 ??C] 36.5 ??C  Heart Rate:  [76-85] 76  SpO2 Pulse:  [75-91] 75  Resp:  [20-25] 21  BP: (81-117)/(47-75) 99/66  MAP (mmHg):  [56-89] 76  SpO2:  [94 %-99 %] 94 %    Intake/Output last 24 hours:  I/O last 3 completed shifts:  In: 740.2 [P.O.:580; I.V.:160.2]  Out: 2910 [Urine:80; Drains:330; Other:2500]    Physical Exam:    General Appearance:   Patient lying in bed, in no acute distress  Lungs:                Clear to auscultation bilaterally  Heart:                           Regular rate and rhythm, MAP adequate >55, HDS  Abdomen:                Soft, non-tender, non-distended, surgical incision C/D/I with staples. JP drains x3 with ss output  GU:   Foley in place, flushes easily, no output  Extremities:              Warm and well perfused  Psych:    Mood and affect appropriate  Neuro:     Speech and memory intact, no focal deficits    Data Review:    I have reviewed the labs and studies from the last 24 hours.      Imaging: Radiology studies were personally reviewed

## 2018-12-08 NOTE — Unmapped (Signed)
Tacrolimus Therapeutic Monitoring Pharmacy Note    Kimberly [name] is a 38 y.o. female continuing tacrolimus.     Indication: Kidney transplant     Date of Transplant: 12/05/18      Prior Dosing Information: HOLDING current regimen 4 mg BID     Goals:  Therapeutic Drug Levels  Tacrolimus trough goal: 8-10 ng/mL    Additional Clinical Monitoring/Outcomes  ?? Monitor renal function (SCr and urine output) and liver function (LFTs)  ?? Monitor for signs/symptoms of adverse events (e.g., hyperglycemia, hyperkalemia, hypomagnesemia, hypertension, headache, tremor)    Results:   Tacrolimus level: 14.4 ng/mL, drawn appropriately    Pharmacokinetic Considerations and Significant Drug Interactions:  ? Concurrent hepatotoxic medications: None identified  ? Concurrent CYP3A4 substrates/inhibitors: None identified  ? Concurrent nephrotoxic medications: Bactrim    Assessment/Plan:  Recommendedation(s)  ? Continue to HOLD tacrolimus and will follow-up with tacrolimus levels    Follow-up  ? Daily tac levels prior to AM dose.   ? A pharmacist will continue to monitor and recommend levels as appropriate    Please page service pharmacist with questions/clarifications.    Ladon Applebaum, PharmD

## 2018-12-08 NOTE — Unmapped (Addendum)
Patient Kimberly [name] is hemodynamically stable on 2L [place]. Continues to experience dyspnea with exertion. Foley intact with continued low urine output. JP drain intact to L abdomen with serosanguinous output. Has ordered one meal thus far. Drinking small amounts. Has been experiencing discomfort 2/2 acid reflux. Calcium carbonate ordered; reported some relief with first admin. Has ambulated twos laps thus far and is currently sitting in the chair. Performed bath independently in bathroom. Goal is to ambulate once more today. Per dialysis unit, dialysis will be performed during night shift. Will continue to monitor closely.    Problem: Adult Inpatient Plan of Care  Goal: Plan of Care Review  Outcome: Progressing  Goal: Patient-Specific Goal (Individualization)  Outcome: Progressing  Flowsheets (Taken 12/08/2018 1425)  Individualized Care Needs: BP monitoring (soft BPs requiring midodrine). Oxygen monitoring (2L [place], dyspnea with exertion). UOP monitoring (delayed graft function w/ low uop requiring dialysis PRN).     Problem: Self-Care Deficit  Goal: Improved Ability to Complete Activities of Daily Living  Outcome: Progressing     Problem: Skin Injury Risk Increased  Goal: Skin Health and Integrity  Outcome: Progressing     Problem: Venous Thromboembolism  Goal: VTE (Venous Thromboembolism) Symptom Resolution  Outcome: Progressing     Problem: Pain Acute  Goal: Optimal Pain Control  Outcome: Progressing     Problem: Bleeding (Surgery Nonspecified)  Goal: Absence of Bleeding  Outcome: Progressing     Problem: Infection (Surgery Nonspecified)  Goal: Absence of Infection Signs/Symptoms  Outcome: Progressing     Problem: Pain (Surgery Nonspecified)  Goal: Acceptable Pain Control  Outcome: Progressing

## 2018-12-08 NOTE — Unmapped (Addendum)
Afebrile. HR SR. Hypotensive w/ SBP 80s but asymptomatic (SRF notified). Weaned from 4 to 2L [place]. Pt has DOE. Doing IS independently w/ good effort. No c/o pain. JP remains in place w/ moderate serosanguinous output. Foley in place w/ scant amount of brown UOP (30 ml this shift). Dopamine infusion stopped per order. Pt advanced to regular diet today and tolerating w/o difficulty. No BM yet. OOB and ambulating short distances (d/t DOE). SCDs in place. Sat up in bedside chair majority of shift. Pt's family member at bedside and updated on POC. Will continue to monitor. Kimberly [name]     Problem: Adult Inpatient Plan of Care  Goal: Plan of Care Review  Outcome: Progressing  Flowsheets (Taken 12/07/2018 1640)  Progress: improving  Plan of Care Reviewed With:  ??? patient  ??? family  Goal: Patient-Specific Goal (Individualization)  Outcome: Progressing  Flowsheets (Taken 12/07/2018 1640)  Patient-Specific Goals (Include Timeframe): pt will have increased UOP by 12/09/18  Individualized Care Needs: pt prefers to go by [employer]  Goal: Absence of Hospital-Acquired Illness or Injury  Outcome: Progressing  Goal: Optimal Comfort and Wellbeing  Outcome: Progressing  Goal: Readiness for Transition of Care  Outcome: Progressing  Goal: Rounds/Family Conference  Outcome: Progressing  Flowsheets (Taken 12/07/2018 1640)  Clinical EDD (Estimated Discharge Date): 12/14/2018  Participants:  ??? case manager  ??? nursing     Problem: Self-Care Deficit  Goal: Improved Ability to Complete Activities of Daily Living  Outcome: Progressing     Problem: Skin Injury Risk Increased  Goal: Skin Health and Integrity  Outcome: Progressing     Problem: Venous Thromboembolism  Goal: VTE (Venous Thromboembolism) Symptom Resolution  Outcome: Progressing     Problem: Pain Acute  Goal: Optimal Pain Control  Outcome: Progressing     Problem: Bleeding (Surgery Nonspecified)  Goal: Absence of Bleeding  Outcome: Progressing     Problem: Infection (Surgery Nonspecified)  Goal: Absence of Infection Signs/Symptoms  Outcome: Progressing     Problem: Pain (Surgery Nonspecified)  Goal: Acceptable Pain Control  Outcome: Progressing

## 2018-12-08 NOTE — Unmapped (Signed)
Shift Summary 1900-0700: Pt alert and oriented x4. Denies pain. Remains on 2L [place]. Tolerating well. BP stable this shift. Abdominal incision intact. No s/sx of infection noted. JP drainage to LLQ, serosanguinous drainage. UOP minimal. POC: Pt to receive HD at the bedside. Will continue to monitor and assess.   Problem: Adult Inpatient Plan of Care  Goal: Plan of Care Review  Outcome: Progressing  Goal: Patient-Specific Goal (Individualization)  Outcome: Progressing  Goal: Absence of Hospital-Acquired Illness or Injury  Outcome: Progressing  Goal: Optimal Comfort and Wellbeing  Outcome: Progressing  Goal: Readiness for Transition of Care  Outcome: Progressing  Goal: Rounds/Family Conference  Outcome: Progressing     Problem: Self-Care Deficit  Goal: Improved Ability to Complete Activities of Daily Living  Outcome: Progressing     Problem: Skin Injury Risk Increased  Goal: Skin Health and Integrity  Outcome: Progressing     Problem: Pain Acute  Goal: Optimal Pain Control  Outcome: Progressing     Problem: Bleeding (Surgery Nonspecified)  Goal: Absence of Bleeding  Outcome: Progressing     Problem: Infection (Surgery Nonspecified)  Goal: Absence of Infection Signs/Symptoms  Outcome: Progressing     Problem: Pain (Surgery Nonspecified)  Goal: Acceptable Pain Control  Outcome: Progressing     Problem: Venous Thromboembolism  Goal: VTE (Venous Thromboembolism) Symptom Resolution  Outcome: Progressing     Problem: Adult Inpatient Plan of Care  Goal: Plan of Care Review  Outcome: Progressing  Goal: Patient-Specific Goal (Individualization)  Outcome: Progressing  Goal: Absence of Hospital-Acquired Illness or Injury  Outcome: Progressing  Goal: Optimal Comfort and Wellbeing  Outcome: Progressing  Goal: Readiness for Transition of Care  Outcome: Progressing  Goal: Rounds/Family Conference  Outcome: Progressing     Problem: Self-Care Deficit  Goal: Improved Ability to Complete Activities of Daily Living  Outcome: Progressing Problem: Skin Injury Risk Increased  Goal: Skin Health and Integrity  Outcome: Progressing     Problem: Pain Acute  Goal: Optimal Pain Control  Outcome: Progressing     Problem: Bleeding (Surgery Nonspecified)  Goal: Absence of Bleeding  Outcome: Progressing     Problem: Infection (Surgery Nonspecified)  Goal: Absence of Infection Signs/Symptoms  Outcome: Progressing     Problem: Pain (Surgery Nonspecified)  Goal: Acceptable Pain Control  Outcome: Progressing     Problem: Venous Thromboembolism  Goal: VTE (Venous Thromboembolism) Symptom Resolution  Outcome: Progressing

## 2018-12-09 DIAGNOSIS — N186 End stage renal disease: Principal | ICD-10-CM

## 2018-12-09 LAB — CBC
HEMATOCRIT: 33.1 % — ABNORMAL LOW (ref 36.0–46.0)
HEMOGLOBIN: 10.4 g/dL — ABNORMAL LOW (ref 12.0–16.0)
MEAN CORPUSCULAR HEMOGLOBIN CONC: 31.4 g/dL (ref 31.0–37.0)
MEAN CORPUSCULAR HEMOGLOBIN: 28.9 pg (ref 26.0–34.0)
MEAN CORPUSCULAR VOLUME: 92.2 fL (ref 80.0–100.0)
PLATELET COUNT: 397 10*9/L (ref 150–440)
RED BLOOD CELL COUNT: 3.59 10*12/L — ABNORMAL LOW (ref 4.00–5.20)
WBC ADJUSTED: 12.4 10*9/L — ABNORMAL HIGH (ref 4.5–11.0)

## 2018-12-09 LAB — TACROLIMUS, TROUGH: Lab: 11.6

## 2018-12-09 LAB — MAGNESIUM: Magnesium:MCnc:Pt:Ser/Plas:Qn:: 2.1

## 2018-12-09 LAB — BASIC METABOLIC PANEL
ANION GAP: 14 mmol/L (ref 7–15)
BUN / CREAT RATIO: 5
CALCIUM: 10.2 mg/dL (ref 8.5–10.2)
CHLORIDE: 99 mmol/L (ref 98–107)
CO2: 23 mmol/L (ref 22.0–30.0)
EGFR CKD-EPI AA FEMALE: 15 mL/min/{1.73_m2} — ABNORMAL LOW (ref >=60)
EGFR CKD-EPI NON-AA FEMALE: 13 mL/min/{1.73_m2} — ABNORMAL LOW (ref >=60)
POTASSIUM: 4.3 mmol/L (ref 3.5–5.0)
SODIUM: 136 mmol/L (ref 135–145)

## 2018-12-09 LAB — MEAN CORPUSCULAR HEMOGLOBIN CONC: Lab: 31.4

## 2018-12-09 LAB — POTASSIUM: Potassium:SCnc:Pt:Ser/Plas:Qn:: 4.3

## 2018-12-09 LAB — PHOSPHORUS: Phosphate:MCnc:Pt:Ser/Plas:Qn:: 4.9 — ABNORMAL HIGH

## 2018-12-09 NOTE — Unmapped (Signed)
Shift summary 7A- transfer to 5W: Pt AOX4, soft BPs but within call parameters, NSR, on 2L [place], trying to wean, afebrile. No complaints of pain. SCDs on bilaterally. Pt can turn self; no new skin breakdown noted. Pt ambulated in room. Foley w/almost no UOP; MDs are aware. No BM this shift; pt states she is passing gas. Pt transferred to 5W. Will continue to monitor and assess.      Problem: Adult Inpatient Plan of Care  Goal: Plan of Care Review  Outcome: Progressing  Flowsheets (Taken 12/09/2018 1235)  Progress: improving  Plan of Care Reviewed With: patient  Goal: Patient-Specific Goal (Individualization)  Outcome: Progressing  Flowsheets (Taken 12/09/2018 1235)  Patient-Specific Goals (Include Timeframe): Pt will ambulate during the shift  Goal: Absence of Hospital-Acquired Illness or Injury  Outcome: Progressing  Goal: Optimal Comfort and Wellbeing  Outcome: Progressing  Goal: Readiness for Transition of Care  Outcome: Progressing  Goal: Rounds/Family Conference  Outcome: Progressing     Problem: Self-Care Deficit  Goal: Improved Ability to Complete Activities of Daily Living  Outcome: Progressing     Problem: Skin Injury Risk Increased  Goal: Skin Health and Integrity  Outcome: Progressing     Problem: Pain Acute  Goal: Optimal Pain Control  Outcome: Progressing     Problem: Bleeding (Surgery Nonspecified)  Goal: Absence of Bleeding  Outcome: Progressing     Problem: Infection (Surgery Nonspecified)  Goal: Absence of Infection Signs/Symptoms  Outcome: Progressing     Problem: Pain (Surgery Nonspecified)  Goal: Acceptable Pain Control  Outcome: Progressing  Intervention: Prevent or Manage Pain  Flowsheets (Taken 12/09/2018 1235)  Pain Management Interventions: pillow support provided     Problem: Venous Thromboembolism  Goal: VTE (Venous Thromboembolism) Symptom Resolution  Outcome: Progressing  Intervention: Prevent or Manage VTE (Venous Thromboembolism)  Flowsheets (Taken 12/09/2018 1235)  Bleeding Precautions: blood pressure closely monitored  VTE Prevention/Management:   ambulation promoted   bleeding precautions maintained  Anti-Embolism Device Type: SCD, Knee  Anti-Embolism Intervention: On  Anti-Embolism Device Location: BLE

## 2018-12-09 NOTE — Unmapped (Signed)
SURGERY PROGRESS NOTES      Assessment/Plan:     [name] is a 38 y.o. 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    -- Baseline hypotension to 85/45??   - Midodrine 15 TID, MAP goal >26mmHg   - PRN albumin infusions with HD  -- Delayed graft function, markedly oliguric   - Renal transplant ultrasound POD0, POD1 = adequate perfusion   - Scheduled hemodialysis T-Th-Sat until improved graft function  -- Start transplant meds (valganciclovir, bactrim, nystatin, cellcept)   - Continue daily tac trough draws, resume 2mg  BID   - Solumedrol taper ended POD3  -- Daily labs with tac trough  -- [place], regular diet, SSI, protonix, famotidine, tums, mucositis mix  -- SQH, ASA    -- Dispo: Floor  PT: 3x    Subjective:  NAEO. Delayed graft function, continues to be markedly oliguric with <82mL UOP daily. Otherwise doing well, VSS, stable on 2L [place] for comfort only, had bowel movement. C/o some reflux, has baseline reflux at home, no evidence of ileus. Had HD on-unit last night, now appropriate for floor status.    Objective:    Vital signs in last 24 hours:  Temp:  [36.4 ??C-37 ??C] 36.6 ??C  Heart Rate:  [57-91] 67  SpO2 Pulse:  [70-76] 73  Resp:  [15-23] 21  BP: (91-140)/(57-89) 91/61  MAP (mmHg):  [70-100] 70  SpO2:  [97 %-100 %] 99 %    Intake/Output last 24 hours:  I/O last 3 completed shifts:  In: 1150 [P.O.:600; Other:550]  Out: 3307 [Urine:57; Drains:200; Other:3050]    Physical Exam:    General Appearance:   Patient lying in bed, in no acute distress  Lungs:                Clear to auscultation bilaterally  Heart:                           Regular rate and rhythm, MAP adequate >55, HDS  Abdomen:                Soft, non-tender, non-distended, surgical incision C/D/I with staples. JP drain with ss output  GU:   Foley in place, flushes easily, no output  Extremities:              Warm and well perfused, no edema  Psych:    Mood and affect appropriate  Neuro:     Speech and memory intact, no focal deficits    Data Review:    I have reviewed the labs and studies from the last 24 hours.    Imaging: None

## 2018-12-09 NOTE — Unmapped (Signed)
Order was placed for a PIV by Venous Access Team (VAT).  Patient was assessed for placement of a PIV. Access was obtained. Blood return noted.  Dressing intact and device well secured.  Flushed with normal saline.  Pt advised to inform RN of any s/s of discomfort at the PIV site.    Workup / Procedure Time:  15 minutes      Primary RN was notified.       Thank you,     Iantha Fallen RN Venous Access Team

## 2018-12-09 NOTE — Unmapped (Signed)
HEMODIALYSIS NURSE PROCEDURE NOTE    Treatment Number:  2 Room/Station:  (9147) Procedure Date:  12/09/18   Total Treatment Time:  212 Min.    CONSENT:  Written consent was obtained prior to the procedure and is detailed in the medical record. Prior to the start of the procedure, a time out was taken and the identity of the patient was confirmed via name, medical record number and date of birth.     WEIGHTS:  Hemodialysis Pre-Treatment Weights     Date/Time Pre-Treatment Weight (kg) Estimated Dry Weight (kg) Patient Goal Weight (kg) Total Goal Weight (kg)    12/09/18 0045  78.4 kg (172 lb 13.5 oz)  ???  2.5 kg (5 lb 8.2 oz)  3.05 kg (6 lb 11.6 oz)           Active Dialysis Orders (168h ago, onward)     Start     Ordered    12/07/18 1742  Hemodialysis inpatient  Every Tue,Thu,Sat     Question Answer Comment   K+ 2 meq/L    Ca++ 2.5 meq/L    Bicarb Other (please specify) 30   Na+ 137 meq/L    Na+ Modeling none    Dialyzer [id number]    Dialysate Temperature (C) 36    BFR-As tolerated to a maximum of: 400 mL/min    DFR 800 mL/min    Duration of treatment 3.5 Hr    Dry weight (kg) tbd    Challenge dry weight (kg) none    Fluid removal (L) 2.5L on 12/08/18    Tubing Adult = 142 ml    Access Site AVF    Access Site Location Other (please specify)    Keep SBP >: 80        12/07/18 1741    12/06/18 2326  Dialysis Schedule Order  (Dialysis ONCE)  Once      12/06/18 2331              ACCESS SITE:             Arteriovenous Fistula - Vein Graft  Access Arteriovenous fistula Left;Upper Arm (Active)   Site Assessment Clean;Bleeding 12/09/2018  4:45 AM   AV Fistula Thrill Present;Bruit Present 12/09/2018  4:45 AM   Status Deaccessed 12/09/2018  4:45 AM   Dressing Intervention Dressing changed 12/09/2018  4:45 AM   Dressing Status      Clean;Dry;Changed 12/09/2018  4:45 AM   Site Condition No complications;Bleeding 12/09/2018  4:45 AM   Dressing Other (Comment);Gauze 12/09/2018  4:45 AM   Dressing To Be Removed (Date/Time) <24 per RN discretion 12/09/2018  4:45 AM       Patient Lines/Drains/Airways Status    Active Peripheral & Central Intravenous Access     Name:   Placement date:   Placement time:   Site:   Days:    Peripheral IV 12/07/18 Right Forearm   12/07/18    0915    Forearm   1              LAB RESULTS:  Lab Results   Component Value Date    NA 131 (L) 12/08/2018    K 4.7 12/08/2018    CL 89 (L) 12/08/2018    CO2 21.0 (L) 12/08/2018    BUN 57 (H) 12/08/2018    CALCIUM 9.7 12/08/2018    CAION 4.82 12/07/2018    PHOS 8.0 (H) 12/08/2018    MG 2.1 12/08/2018    PTH 73.8 (H) 12/05/2018  Lab Results   Component Value Date    WBC 17.9 (H) 12/08/2018    HGB 10.0 (L) 12/08/2018    HCT 31.1 (L) 12/08/2018    PLT 283 12/08/2018    PHART 7.38 12/05/2018    PO2ART 68.9 (L) 12/05/2018    PCO2ART 42.8 12/05/2018    HCO3ART 25 12/05/2018    BEART 0.1 12/05/2018    O2SATART 92.7 (L) 12/05/2018    APTT 33.3 12/05/2018    HEPBSAG Negative 01/16/2015      VITAL SIGNS:  Temperature     Date/Time Temp Temp src      12/09/18 0445  36.8 ??C (98.2 ??F)  Oral     12/09/18 0441  36.5 ??C (97.7 ??F)  Oral     12/09/18 0109  36.4 ??C (97.5 ??F)  Oral         Hemodynamics     Date/Time Pulse BP MAP (mmHg) Patient Position    12/09/18 0445  73  111/70  84  Lying    12/09/18 0441  70  118/70  ???  Lying    12/09/18 0430  68  120/72  ???  Lying    12/09/18 0415  62  118/70  ???  Lying    12/09/18 0400  57  97/59  ???  Lying    12/09/18 0345  59  94/57  ???  Lying    12/09/18 0330  76  121/64  ???  Lying    12/09/18 0315  71  120/73  ???  Lying    12/09/18 0300  74  121/74  ???  Lying    12/09/18 0245  74  106/66  ???  Lying    12/09/18 0230  91  117/66  ???  Lying    12/09/18 0215  75  101/60  ???  Lying    12/09/18 0200  69  106/73  ???  Lying    12/09/18 0145  63  116/71  ???  Lying    12/09/18 0130  72  107/67  ???  Lying    12/09/18 0115  74  124/74  ???  Lying    12/09/18 0109  74  124/74  ???  Lying          Oxygen Therapy     Date/Time Resp SpO2 O2 Device O2 Flow Rate (L/min)    12/09/18 0445  20  100 % Nasal cannula  2 L/min    12/09/18 0441  17  100 %  ???  ???    12/09/18 0430  16  99 %  ???  ???    12/09/18 0415  18  99 %  ???  ???    12/09/18 0400  17  99 %  ???  ???    12/09/18 0345  18  98 %  ???  ???    12/09/18 0330  18  100 %  ???  ???    12/09/18 0315  20  100 %  ???  ???    12/09/18 0300  18  100 %  ???  ???    12/09/18 0245  21  98 %  ???  ???    12/09/18 0230  22  98 %  ???  ???    12/09/18 0215  18  99 %  ???  ???    12/09/18 0200  19  100 %  ???  ???    12/09/18 0145  15  99 %  ???  ???  12/09/18 0130  19  99 %  ???  ???    12/09/18 0115  22  99 %  ???  ???    12/09/18 0109  18  97 %  ???  ???        Oxygen Connected to Wall:  yes    Pre-Hemodialysis Assessment     Date/Time Therapy Number Dialyzer All Psychologist, counselling Dialysis Flow (mL/min)    12/09/18 0206  ???  ???  ???  ???  ???    12/09/18 0045  2  F-180 (98 mLs)  Yes  Engaged  800 mL/min    Date/Time Verify Priming Solution Priming Volume Hemodialysis Independent pH Hemodialysis Machine Conductivity (mS/cm) Hemodialysis Independent Conductivity (mS/cm)    12/09/18 0206  ???  ???  7.4  14.1 mS/cm  14.4 mS/cm    12/09/18 0045  0.9% NS  300 mL  7  13.9 mS/cm  14.2 mS/cm    Date/Time Bicarb Conductivity Residual Bleach Negative Free Chlorine Total Chlorine Chloramine    12/09/18 0206 --  ??? --  ??? --    12/09/18 0045 --  Yes --  0 --        Pre-Hemodialysis Treatment Comments     Date/Time Pre-Hemodialysis Comments    12/09/18 0045  Stable        Hemodialysis Treatment     Date/Time Blood Flow Rate (mL/min) Arterial Pressure (mmHg) Venous Pressure (mmHg) Transmembrane Pressure (mmHg)    12/09/18 0441  400 mL/min  -140 mmHg  260 mmHg  40 mmHg    12/09/18 0430  400 mL/min  -140 mmHg  260 mmHg  40 mmHg    12/09/18 0415  400 mL/min  -140 mmHg  270 mmHg  40 mmHg    12/09/18 0400  400 mL/min  -140 mmHg  230 mmHg  40 mmHg    12/09/18 0345  400 mL/min  -140 mmHg  240 mmHg  40 mmHg    12/09/18 0330  400 mL/min  -130 mmHg  210 mmHg  40 mmHg    12/09/18 0315  400 mL/min  -130 mmHg  210 mmHg  40 mmHg    12/09/18 0300 400 mL/min  -140 mmHg  210 mmHg  40 mmHg    12/09/18 0245  400 mL/min  -130 mmHg  220 mmHg  40 mmHg    12/09/18 0230  350 mL/min  -100 mmHg  180 mmHg  40 mmHg    12/09/18 0215  350 mL/min  -100 mmHg  170 mmHg  40 mmHg    12/09/18 0200  350 mL/min  -110 mmHg  170 mmHg  40 mmHg    12/09/18 0145  350 mL/min  -120 mmHg  200 mmHg  50 mmHg    12/09/18 0130  350 mL/min  -110 mmHg  180 mmHg  50 mmHg    12/09/18 0115  300 mL/min  -70 mmHg  140 mmHg  50 mmHg    12/09/18 0109  300 mL/min  -70 mmHg  140 mmHg  50 mmHg    Date/Time Ultrafiltration Rate (mL/hr) Ultrafiltrate Removed (mL) Dialysate Flow Rate (mL/min) KECN (Kecn)    12/09/18 0441  870 mL/hr  3050 mL  800 ml/min  ???    12/09/18 0430  870 mL/hr  2885 mL  800 ml/min  ???    12/09/18 0415  870 mL/hr  2664 mL  800 ml/min  ???    12/09/18 0400  870 mL/hr  2448 mL  800  ml/min  ???    12/09/18 0345  870 mL/hr  2236 mL  800 ml/min  ???    12/09/18 0330  870 mL/hr  2005 mL  800 ml/min  ???    12/09/18 0315  870 mL/hr  1790 mL  800 ml/min  ???    12/09/18 0300  870 mL/hr  1577 mL  800 ml/min  ???    12/09/18 0245  870 mL/hr  1390 mL  800 ml/min  ???    12/09/18 0230  870 mL/hr  1175 mL  800 ml/min  ???    12/09/18 0215  870 mL/hr  953 mL  800 ml/min  ???    12/09/18 0200  870 mL/hr  722 mL  800 ml/min  ???    12/09/18 0145  870 mL/hr  520 mL  800 ml/min  ???    12/09/18 0130  870 mL/hr  380 mL  800 ml/min  ???    12/09/18 0115  870 mL/hr  110 mL  800 ml/min  ???    12/09/18 0109  870 mL/hr  0 mL  800 ml/min  ???        Hemodialysis Treatment Comments     Date/Time Intra-Hemodialysis Comments    12/09/18 0441  Tx terminated per protocol without difficulty    12/09/18 0430  Pt resting quietly, vitals stable, access visible    12/09/18 0415  Pt resting quietly, vitals stable, access visible    12/09/18 0400  Pt resting quietly, vitals stable, access visible    12/09/18 0345  Pt watching TV, denies complaints, vitals stable, access visible    12/09/18 0330  Pt watching TV, denies complaints, vitals stable, access visible    12/09/18 0315  Pt watching TV, denies complaints, vitals stable, access visible    12/09/18 0300  Pt watching TV, denies complaints, vitals stable, access visible    12/09/18 0245  VSS;NAD    12/09/18 0230  VSS;NAD    12/09/18 0215  VSS;NAD    12/09/18 0200  VSS;NAD    12/09/18 0145  VSS;NAD    12/09/18 0130  VSS;NAD    12/09/18 0115  VSS;NAD    12/09/18 0109  VSS;NAD        Post Treatment     Date/Time Rinseback Volume (mL) On Line Clearance: spKt/V Total Liters Processed (L/min) Dialyzer Clearance    12/09/18 0500  300 mL  ??? Kt 53.9  3050 L/min  Lightly streaked        Post Hemodialysis Treatment Comments     Date/Time Post-Hemodialysis Comments    12/09/18 0500  Pt AOx4, vitals stable, denies complaints        POST TREATMENT ASSESSMENT:  General appearance:  alert, cooperative and no distress  Neurological:  Grossly normal  Lungs:  diminished breath sounds bilaterally and rales bibasilar  Hearts:  regular rate and rhythm, S1, S2 normal, no murmur, click, rub or gallop  Abdomen:  soft, non-tender; bowel sounds normal; no masses,  no organomegaly  Skin:  Skin color, texture, turgor normal. No rashes or lesions    Hemodialysis I/O     Date/Time Total Hemodialysis Replacement Volume (mL) Total Ultrafiltrate Output (mL)    12/09/18 0500  550 mL  3050 mL        [id number] - Medicaitons Given During Treatment  (last 5 hrs)         ADAM Elmo Putt, RN       Medication Name Action Time Action Route  Rate Dose User     heparin (porcine) injection 5,000 Units 12/09/18 0121 Refused Subcutaneous  5,000 Units Tanja Port, RN

## 2018-12-10 ENCOUNTER — Inpatient Hospital Stay: Admit: 2018-12-10 | Payer: MEDICARE

## 2018-12-10 LAB — PHOSPHORUS: Phosphate:MCnc:Pt:Ser/Plas:Qn:: 5.5 — ABNORMAL HIGH

## 2018-12-10 LAB — BASIC METABOLIC PANEL
ANION GAP: 15 mmol/L (ref 7–15)
BLOOD UREA NITROGEN: 57 mg/dL — ABNORMAL HIGH (ref 7–21)
CALCIUM: 9.7 mg/dL (ref 8.5–10.2)
CHLORIDE: 95 mmol/L — ABNORMAL LOW (ref 98–107)
CO2: 21 mmol/L — ABNORMAL LOW (ref 22.0–30.0)
CREATININE: 7.32 mg/dL — ABNORMAL HIGH (ref 0.60–1.00)
EGFR CKD-EPI AA FEMALE: 7 mL/min/{1.73_m2} — ABNORMAL LOW (ref >=60)
EGFR CKD-EPI NON-AA FEMALE: 6 mL/min/{1.73_m2} — ABNORMAL LOW (ref >=60)
GLUCOSE RANDOM: 104 mg/dL (ref 70–179)
POTASSIUM: 4.7 mmol/L (ref 3.5–5.0)
SODIUM: 131 mmol/L — ABNORMAL LOW (ref 135–145)

## 2018-12-10 LAB — SODIUM: Sodium:SCnc:Pt:Ser/Plas:Qn:: 131 — ABNORMAL LOW

## 2018-12-10 LAB — MAGNESIUM: Magnesium:MCnc:Pt:Ser/Plas:Qn:: 2.2

## 2018-12-10 LAB — WBC ADJUSTED: Lab: 12.5 — ABNORMAL HIGH

## 2018-12-10 LAB — CBC
HEMATOCRIT: 28.8 % — ABNORMAL LOW (ref 36.0–46.0)
HEMOGLOBIN: 9.5 g/dL — ABNORMAL LOW (ref 12.0–16.0)
MEAN CORPUSCULAR HEMOGLOBIN CONC: 33 g/dL (ref 31.0–37.0)
MEAN CORPUSCULAR VOLUME: 92 fL (ref 80.0–100.0)
PLATELET COUNT: 369 10*9/L (ref 150–440)
RED BLOOD CELL COUNT: 3.13 10*12/L — ABNORMAL LOW (ref 4.00–5.20)
RED CELL DISTRIBUTION WIDTH: 16.2 % — ABNORMAL HIGH (ref 12.0–15.0)
WBC ADJUSTED: 12.5 10*9/L — ABNORMAL HIGH (ref 4.5–11.0)

## 2018-12-10 LAB — TACROLIMUS, TROUGH: Lab: 9

## 2018-12-10 MED ORDER — ASPIRIN 81 MG TABLET,DELAYED RELEASE
ORAL_TABLET | Freq: Every day | ORAL | 11 refills | 0 days | Status: CP
Start: 2018-12-10 — End: 2019-12-10
  Filled 2018-12-11: qty 30, 30d supply, fill #0

## 2018-12-10 MED ORDER — MG-PLUS-PROTEIN 133 MG TABLET
ORAL_TABLET | Freq: Two times a day (BID) | ORAL | 11 refills | 0.00000 days | Status: CP
Start: 2018-12-10 — End: 2019-01-21
  Filled 2018-12-11: qty 100, 30d supply, fill #0

## 2018-12-10 MED ORDER — PROGRAF 1 MG CAPSULE
ORAL_CAPSULE | Freq: Two times a day (BID) | ORAL | 11 refills | 0.00000 days | Status: CP
Start: 2018-12-10 — End: 2018-12-18
  Filled 2018-12-11: qty 120, 30d supply, fill #0

## 2018-12-10 MED ORDER — CALCITRIOL 0.25 MCG CAPSULE
ORAL_CAPSULE | Freq: Every day | ORAL | 11 refills | 30 days | Status: CP
Start: 2018-12-10 — End: 2019-12-10
  Filled 2018-12-11: qty 30, 30d supply, fill #0

## 2018-12-10 MED ORDER — GABAPENTIN 100 MG CAPSULE
ORAL_CAPSULE | Freq: Every evening | ORAL | 0 refills | 0 days | Status: CP
Start: 2018-12-10 — End: 2018-12-21
  Filled 2018-12-11: qty 9, 9d supply, fill #0

## 2018-12-10 MED ORDER — DOCUSATE SODIUM 100 MG CAPSULE
ORAL_CAPSULE | Freq: Two times a day (BID) | ORAL | 2 refills | 0 days | PRN
Start: 2018-12-10 — End: 2018-12-18

## 2018-12-10 MED ORDER — OMEPRAZOLE 40 MG CAPSULE,DELAYED RELEASE
ORAL_CAPSULE | Freq: Every day | ORAL | 11 refills | 30 days | Status: CP
Start: 2018-12-10 — End: 2019-12-10
  Filled 2018-12-11: qty 30, 30d supply, fill #0

## 2018-12-10 MED ORDER — MIDODRINE 5 MG TABLET
ORAL_TABLET | Freq: Three times a day (TID) | ORAL | 11 refills | 0 days | Status: CP
Start: 2018-12-10 — End: 2019-03-19
  Filled 2018-12-11: qty 270, 30d supply, fill #0

## 2018-12-10 MED ORDER — POLYETHYLENE GLYCOL 3350 17 GRAM/DOSE ORAL POWDER
Freq: Every day | ORAL | 0 refills | 0.00000 days | PRN
Start: 2018-12-10 — End: 2018-12-18

## 2018-12-10 MED ORDER — ACETAMINOPHEN 500 MG TABLET
ORAL_TABLET | Freq: Four times a day (QID) | ORAL | 0 refills | 0 days | Status: CP | PRN
Start: 2018-12-10 — End: ?
  Filled 2018-12-11: qty 100, 13d supply, fill #0

## 2018-12-10 NOTE — Unmapped (Signed)
Plan of care reviewed with pt upon arrival from I[facility] to [facility] this shift. VSS. Urine output remains oliguric with foley catheter, SRF aware. No reports of pain this shift. Tolerating diet without report of N/V this shift. Pt last dialyzed yesterday evening (2/15). Plan for next dialysis 12/11/2018. (Tu/Thurs/Sat). Otherwise, no pt questions/concerns at this time.        Problem: Adult Inpatient Plan of Care  Goal: Plan of Care Review  Outcome: Progressing  Goal: Patient-Specific Goal (Individualization)  Outcome: Progressing  Goal: Absence of Hospital-Acquired Illness or Injury  Outcome: Progressing  Goal: Optimal Comfort and Wellbeing  Outcome: Progressing  Goal: Readiness for Transition of Care  Outcome: Progressing  Goal: Rounds/Family Conference  Outcome: Progressing     Problem: Self-Care Deficit  Goal: Improved Ability to Complete Activities of Daily Living  Outcome: Progressing     Problem: Skin Injury Risk Increased  Goal: Skin Health and Integrity  Outcome: Progressing     Problem: Pain Acute  Goal: Optimal Pain Control  Outcome: Progressing     Problem: Bleeding (Surgery Nonspecified)  Goal: Absence of Bleeding  Outcome: Progressing     Problem: Infection (Surgery Nonspecified)  Goal: Absence of Infection Signs/Symptoms  Outcome: Progressing     Problem: Pain (Surgery Nonspecified)  Goal: Acceptable Pain Control  Outcome: Progressing     Problem: Venous Thromboembolism  Goal: VTE (Venous Thromboembolism) Symptom Resolution  Outcome: Progressing

## 2018-12-10 NOTE — Unmapped (Signed)
Abdominal Transplant Inpatient Pharmacy Education Note    Date: 12/10/2018    Patient: Kimberly [name]    s/p kidney transplant    Discharge Education Provided: ??    ? Provided patient a medication card with list of all discharge medications. Discussed anti-rejection and anti-infective medication regimens in depth. The patient's full medication schedule was discussed in detail with the patient and included drug name, indication, dose, appropriate timing of self-administration, drug monitoring, drug/drug interaction drug/food interaction, herbal/over-the-counter medication use, side effect profile, and generic versus brand formulation.    ? Adverse effects of tacrolimus discussed: Nephrotoxicity, neurotoxicity including headache, tremor and possible seizures and metabolic disturbances including hypertension, hyperglycemia and hyperlipidemia. The patient verbalized understanding regarding not to take their calcineurin inhibitor prior to lab draws to ensure accurate trough levels.    ? Adverse effects of mycophenolic acid: Decreased blood counts, infection and most commonly gastrointestinal distress consisting of diarrhea, nausea and abdominal pain.     ? Stressed the importance of medication adherence to transplant outcomes.    ? Myfortic Medication Guide will be given at time of discharge.  Provided patient with Mycophenolate REMS Overview and Your Birth Control Options booklet and mycophenolate medication guide. Discussed the following points:  - Known risks to an unborn baby if patient takes mycophenolate while pregnant (risk of birth defects and miscarriage)  - Encouraged patient to wait until 6 weeks after stopping mycophenolate to become pregnant in order to minimize the risk of birth defects and the risk of losing the unborn child  - Acceptable forms of birth control  - To discuss with doctor if thinking about becoming pregnant and to tell doctor right away if become pregnant  - Encouraged to wait at least one year after transplant or at the discretion of the provider before becoming pregnant    Patient signed the patient-prescriber acknowledgement form.    ? Further discussed the avoidance of grapefruit and grapefruit containing beverages and food as it interacts with their calcineurin inhibitor and the importance of sunscreen use.      The patient demonstrated a excellent understanding of the discharge medications. Patient and family member, husband, were engaged and asked insightful questions. The patient was instructed to bring the card and pill bottles to their first follow up visit. All questions and concerns were answered.    25-60 minutes of discharge education was completed with this patient.  To promote optimum therapeutic outcomes and prevent medication errors, the patient???s medication profile was reviewed daily by pharmacy and discussed with the multidisciplinary transplant team.     Vertis Kelch,  PharmD

## 2018-12-10 NOTE — Unmapped (Signed)
Met with spouse and patient to review transplant education booklet. One hour was spent reviewing material and answering questions.   Learning Readiness: spouse and patient acceptance  Method of Instruction: Written instruction - handouts and Verbal instruction.    Topics reviewed include: How to contact the [facility] for Transplant Care, Signs and symptoms of infection and rejection to notify your coordinator, Medications - importance of taking medications as ordered and introduction to [employer] and immunosuppression, Lab work - frequency, holding immunosuppression prior to blood draw, and importance in monitoring for rejection, Wound care - daily assessment of the surgical wounds for infection; keep wounds clean and dry, Clinic appointments and health maintenance screenings, Keeping a daily log for 6 weeks (or as directed by your coordinator), Avoiding infections - Handwashing and no sick contacts, no gardening for the first 3 months, limit diaper changing, discussion about pets, Activity & lifestyles changes; use sun screen to prevent skin cancer, no smoking/ drinking, driving and lifting, Return to sexual activity and STI's, Dietary restrictions including no grapefruit or grapefruit juice, eating cold foods cold and hot foods hot (the 2 hr rule), no leftovers older than 3 days, and restuarant guidelines or Transplant - the gift of life    The spouse and patient asked appropriate questions and verbalized understanding of the material covered. Outcome: verbalized understanding and reinforcement needed for safe food handling and medication administration    Patient received discharge bag including Margaretha Glassing, RN's business card, a water bottle, face masks, urine hat, daily logs, Transplant Team contact sheet, and [employer] pin and [facility] for Transplant Care pen.  Loma Boston, RN 12/10/2018 1:52 PM

## 2018-12-10 NOTE — Unmapped (Signed)
Multidisciplinary rounds were conducted with Estanislado Emms, MD Transplant Surgeon, Cranston Neighbor, RN, CCTN Inpatient Transplant Nurse Coordinator, Belva Chimes, PA Transplant Surgery Physician Assistant, [employer], PharmD Transplant Pharmacist, General Surgery Resident, Elwyn Lade, MD Transplant Nephrologist and Nephrology Fellow. Patient is Acute Care Unit status.    Discharge Plan: Patient to have urinary catheter removed and trial of void. All transplant education will occur prior to discharge tomorrow. Patient may go home with drain tomorrow and nurse will begin drain maintenance and measurement education today. Loma Boston, RN 12/10/2018 12:12 PM

## 2018-12-10 NOTE — Unmapped (Signed)
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 Bell's palsy    ??? Disease of thyroid gland    ??? ESRD (end stage renal disease) (CMS-HCC)    ??? Nonarteritic ischemic optic neuropathy    ??? Reflux nephropathy        Immunosuppression regimen:  Prograf 2 mg BID; goal 8-10   Myfortic 540 mg BID    Antimicrobials during admission:   CMV: D+/R+ -> Valcyte 450 mg twice weekly x 3 months  PJP: Bactrim SS MWF x 6 months  Nystatin while inpatient    Medication changes to be instituted upon discharge:  New medications-  - Immunosuppression: as above  - Antimicrobials as above  - Bowel regimen: Docusate 100 mg BID PRN and Miralax 17 gm daily PRN  - Pain regimen: APAP PRN, gabapentin x 14 days  - CV/heme: aspirin 81 mg daily    Continued home medications: omeprazole, calcitriol, eye drops    Changes in home medications: increase in home midodrine to 15 mg BID    Home medications stopped: vitamin C, Sensipar, Renvela, renal vitamin    Potential adverse effects during admission  Since last visit, does patient have YES NO Tac Dose Modification NOTES      Increase Decrease No Change    1 Neurotoxicity (tremor, paresthesias, tingling, seizures, or headache) []  [x]  []  []  []     2 Nephrotoxicity related to tacrolimus []  [x]  []  []  []     3 Diarrhea, constipation []  [x]  []  []  []     4 Peripheral edema []  [x]  []  []  []     5 Hypertension []  [x]  []  []  []     6 Hyperglycemia []  [x]  []  []  []     7 Other adverse events []  [x]  []  []  []         Suggested monitoring for outpatient follow-up:   Renal function: Patient requiring iHD post-op. Depending on duration, consider restarting renal vitamin and phos binder. Plan for renal biopsy outpatient if renal function does not improve. Continue to monitor UOP and SCr. Adjust Valcyte dosing as needed based on improved renal function.    Blood pressure: Patient's home midodrine increased to 15 mg TID due to low blood pressures. Continue to monitor and adjust the dose as needed.     Rubie Maid,  PharmD

## 2018-12-10 NOTE — Unmapped (Signed)
SURGERY PROGRESS NOTES      Assessment/Plan:     Kimberly [name] is a 38 y.o. 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    -- Baseline hypotension to 85/45??   - Midodrine 15 TID, MAP goal >54mmHg   - PRN albumin infusions with HD  -- Delayed graft function, markedly oliguric   - Renal transplant ultrasound POD0, POD1 = adequate perfusion   - Scheduled hemodialysis T-Th-Sat until improved graft function   - Plan for dialysis tomorrow per NEPHRO  -- Start transplant meds (valganciclovir, bactrim, nystatin, cellcept)   - Continue daily tac trough draws, resume 2mg  BID   - Solumedrol taper ended POD3  -- Daily labs with tac trough  -- Medlock, regular diet, SSI, protonix, famotidine, tums, mucositis mix  -- SQH, ASA  - Renal biopsy POD 10-14 if no graft function     -- Dispo: Continued inpatient care   PT: 3x    Princess Perna, MD  PGY-3 General Surgery Resident  12/10/18  2:15 PM    Subjective:  NAEO. Delayed graft function, continues to be markedly oliguric with <49mL UOP daily. Otherwise doing well, VSS, stable on 2L [place] for comfort only, had bowel movement. Reflux improved with tums, has baseline reflux at home, no evidence of ileus. Tx to floor yesterday.     Objective:    Vital signs in last 24 hours:  Temp:  [35.7 ??C-36.8 ??C] 35.7 ??C  Heart Rate:  [61-69] 64  Resp:  [17-19] 19  BP: (84-105)/(53-67) 102/60  MAP (mmHg):  [63-73] 67  SpO2:  [95 %-99 %] 98 %    Intake/Output last 24 hours:  I/O last 3 completed shifts:  In: 1450 [P.O.:900; Other:550]  Out: 3251 [Urine:53; Drains:148; Other:3050]    Physical Exam:    General Appearance:   Patient lying in bed, in no acute distress  Lungs:                Clear to auscultation bilaterally  Heart: Regular rate and rhythm, MAP adequate >55, HDS  Abdomen:                Soft, non-tender, non-distended, surgical incision C/D/I with staples. JP drain with ss output  GU:   Foley in place, flushes easily, no output  Extremities:              Warm and well perfused, no edema  Psych:    Mood and affect appropriate  Neuro:     Speech and memory intact, no focal deficits    Data Review:    I have reviewed the labs and studies from the last 24 hours.    Imaging: None

## 2018-12-10 NOTE — Unmapped (Signed)
Pt recently transplanted on 12/05/2018. On Home Hemo prior 5 days a week.     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   Diagnostic testing needing follow up: just fyi on last rus and ct scan 3.4 cm septated cystic lesion in the left adnexa, likely representing an ovarian cyst was seen. Pt reported having ovarian cyst in 2014 (also captured on rus in 2014) and cyst went away without intervention.   HM: Pap smear 01/20/2014, due next month.  SW/Psychosocial concerns: none   See FYI's

## 2018-12-10 NOTE — Unmapped (Signed)
Overnight pt rested well. No changes noted. Minimal urine output to none noted. Denied c/o pain. Responded well to mucositis mixture. No falls overnight POC reviewed with patient. Will continue to monitor.   Problem: Adult Inpatient Plan of Care  Goal: Absence of Hospital-Acquired Illness or Injury  Outcome: Progressing     Problem: Self-Care Deficit  Goal: Improved Ability to Complete Activities of Daily Living  Outcome: Progressing     Problem: Skin Injury Risk Increased  Goal: Skin Health and Integrity  Outcome: Progressing     Problem: Pain Acute  Goal: Optimal Pain Control  Outcome: Progressing     Problem: Venous Thromboembolism  Goal: VTE (Venous Thromboembolism) Symptom Resolution  Outcome: Progressing

## 2018-12-11 DIAGNOSIS — N186 End stage renal disease: Principal | ICD-10-CM

## 2018-12-11 LAB — BASIC METABOLIC PANEL
ANION GAP: 17 mmol/L — ABNORMAL HIGH (ref 7–15)
BLOOD UREA NITROGEN: 82 mg/dL — ABNORMAL HIGH (ref 7–21)
BUN / CREAT RATIO: 10
CALCIUM: 9.2 mg/dL (ref 8.5–10.2)
CHLORIDE: 96 mmol/L — ABNORMAL LOW (ref 98–107)
CO2: 17 mmol/L — ABNORMAL LOW (ref 22.0–30.0)
CREATININE: 8.28 mg/dL — ABNORMAL HIGH (ref 0.60–1.00)
EGFR CKD-EPI AA FEMALE: 6 mL/min/{1.73_m2} — ABNORMAL LOW (ref >=60)
GLUCOSE RANDOM: 93 mg/dL (ref 70–179)
POTASSIUM: 4.3 mmol/L (ref 3.5–5.0)
SODIUM: 130 mmol/L — ABNORMAL LOW (ref 135–145)

## 2018-12-11 LAB — CBC
HEMATOCRIT: 29 % — ABNORMAL LOW (ref 36.0–46.0)
MEAN CORPUSCULAR HEMOGLOBIN CONC: 32.8 g/dL (ref 31.0–37.0)
MEAN CORPUSCULAR HEMOGLOBIN: 30.2 pg (ref 26.0–34.0)
MEAN CORPUSCULAR VOLUME: 92.2 fL (ref 80.0–100.0)
PLATELET COUNT: 391 10*9/L (ref 150–440)
RED BLOOD CELL COUNT: 3.14 10*12/L — ABNORMAL LOW (ref 4.00–5.20)
WBC ADJUSTED: 10.3 10*9/L (ref 4.5–11.0)

## 2018-12-11 LAB — PHOSPHORUS: Phosphate:MCnc:Pt:Ser/Plas:Qn:: 6.2 — ABNORMAL HIGH

## 2018-12-11 LAB — CREATININE: Creatinine:MCnc:Pt:Ser/Plas:Qn:: 8.28 — ABNORMAL HIGH

## 2018-12-11 LAB — WBC ADJUSTED: Lab: 10.3

## 2018-12-11 LAB — TACROLIMUS, TROUGH: Lab: 7.8

## 2018-12-11 LAB — MAGNESIUM: Magnesium:MCnc:Pt:Ser/Plas:Qn:: 2.3 — ABNORMAL HIGH

## 2018-12-11 MED FILL — GABAPENTIN 100 MG CAPSULE: 9 days supply | Qty: 9 | Fill #0 | Status: AC

## 2018-12-11 MED FILL — ACETAMINOPHEN 500 MG TABLET: 13 days supply | Qty: 100 | Fill #0 | Status: AC

## 2018-12-11 MED FILL — PROGRAF 1 MG CAPSULE: 30 days supply | Qty: 120 | Fill #0 | Status: AC

## 2018-12-11 MED FILL — VALGANCICLOVIR 450 MG TABLET: 30 days supply | Qty: 30 | Fill #0 | Status: AC

## 2018-12-11 MED FILL — MYFORTIC 180 MG TABLET,DELAYED RELEASE: 30 days supply | Qty: 180 | Fill #0 | Status: AC

## 2018-12-11 MED FILL — ASPIRIN 81 MG TABLET,DELAYED RELEASE: 30 days supply | Qty: 30 | Fill #0 | Status: AC

## 2018-12-11 MED FILL — OMEPRAZOLE 40 MG CAPSULE,DELAYED RELEASE: 30 days supply | Qty: 30 | Fill #0 | Status: AC

## 2018-12-11 MED FILL — MIDODRINE 5 MG TABLET: 30 days supply | Qty: 270 | Fill #0 | Status: AC

## 2018-12-11 MED FILL — MG-PLUS-PROTEIN 133 MG TABLET: 30 days supply | Qty: 100 | Fill #0 | Status: AC

## 2018-12-11 MED FILL — SULFAMETHOXAZOLE 400 MG-TRIMETHOPRIM 80 MG TABLET: 28 days supply | Qty: 12 | Fill #0 | Status: AC

## 2018-12-11 MED FILL — CALCITRIOL 0.25 MCG CAPSULE: 30 days supply | Qty: 30 | Fill #0 | Status: AC

## 2018-12-11 NOTE — Unmapped (Signed)
Plan of care reviewed with pt. VSS. Urine output remains low. SRF aware. Pt will remain on home HD Tu/Thurs/Sat. Foley removed this shift per order. No reports of surgical pain, only heartburn related discomfort. Tolerating diet without report of N/V this shift. PT cleared pt this shift, pt walks with ease without use of assistive device. Plan for discharge tomorrow 2/18. Otherwise, no pt questions/concerns at this time.      Problem: Adult Inpatient Plan of Care  Goal: Plan of Care Review  Outcome: Progressing  Goal: Patient-Specific Goal (Individualization)  Outcome: Progressing  Goal: Absence of Hospital-Acquired Illness or Injury  Outcome: Progressing  Goal: Optimal Comfort and Wellbeing  Outcome: Progressing  Goal: Readiness for Transition of Care  Outcome: Progressing  Goal: Rounds/Family Conference  Outcome: Progressing     Problem: Self-Care Deficit  Goal: Improved Ability to Complete Activities of Daily Living  Outcome: Progressing     Problem: Skin Injury Risk Increased  Goal: Skin Health and Integrity  Outcome: Progressing     Problem: Pain Acute  Goal: Optimal Pain Control  Outcome: Progressing     Problem: Bleeding (Surgery Nonspecified)  Goal: Absence of Bleeding  Outcome: Progressing     Problem: Infection (Surgery Nonspecified)  Goal: Absence of Infection Signs/Symptoms  Outcome: Progressing     Problem: Pain (Surgery Nonspecified)  Goal: Acceptable Pain Control  Outcome: Progressing     Problem: Venous Thromboembolism  Goal: VTE (Venous Thromboembolism) Symptom Resolution  Outcome: Progressing

## 2018-12-11 NOTE — Unmapped (Signed)
Met with husband as pt in dialysis. Checked meds against orange card and confirmed they are all there. Reviewed lab frequency/location with husband and upcoming txp surgery appt. Husband appreciative.    Spoke with Dr. Burna Mortimer and confirmed pt will be resuming HD with home schedule and to notify post coord if UOP picks up. Dr. Burna Mortimer spoke with pt this morning. Pt will be seen on Friday, Feb 28 with Dr. Barnet Glasgow neph-will send appt request.  Caryl Ada Inpatient Transplant Nurse Coordinator 12/11/2018 1:05 PM

## 2018-12-11 NOTE — Unmapped (Addendum)
The below services have been ordered for you by your medical team to help your health and safety at home.    Dialysis Facility: [facility]  Phone:  [phone]  Start of Care:  12/12/18  Schedule: resume home hemodialysis therapy schedule   Special Instructions:  ***    Please contact your Post-Transplant Coordinator if you have any problems/concerns:  Margaretha Glassing  [phone]; [phone]

## 2018-12-11 NOTE — Unmapped (Signed)
Plan of care reviewed at beginning of shift and as needed. Tolerating regular diet, without complaint of nausea or emesis. Dialysis completed this morning. Denies pain requiring intervention. VSS; afebrile. Blood pressures remain soft, but in line with patient's baseline; continuing midodrine TID. Ambulates independently; remains free of falls and injuries. Adequate fluid intake and low but appropriate urine output to this point in shift. All questions and concerns answered as they arise. Covered discharge instructions with patient and spouse; Insurance underwriter on [facility] your [place] [phone]) discussed and given to patient with discharge instructions. Patient verbalized understanding.   Both deny any questions or concerns. Currently in room getting dressed, NAD, call bell within reach. Will continue to monitor until transport takes patient off unit to lobby.     Problem: Adult Inpatient Plan of Care  Goal: Plan of Care Review  Outcome: Resolved  Goal: Patient-Specific Goal (Individualization)  Outcome: Resolved  Flowsheets (Taken 12/11/2018 1430)  Patient-Specific Goals (Include Timeframe): Patient will remain free of falls and injuries through time of discharge  Individualized Care Needs: Prefers to be called Victorino Dike  Goal: Absence of Hospital-Acquired Illness or Injury  Outcome: Resolved  Goal: Optimal Comfort and Wellbeing  Outcome: Resolved  Goal: Readiness for Transition of Care  Outcome: Resolved  Goal: Rounds/Family Conference  Outcome: Resolved     Problem: Self-Care Deficit  Goal: Improved Ability to Complete Activities of Daily Living  Outcome: Resolved     Problem: Skin Injury Risk Increased  Goal: Skin Health and Integrity  Outcome: Resolved     Problem: Venous Thromboembolism  Goal: VTE (Venous Thromboembolism) Symptom Resolution  Outcome: Resolved     Problem: Pain Acute  Goal: Optimal Pain Control  Outcome: Resolved     Problem: Bleeding (Surgery Nonspecified)  Goal: Absence of Bleeding Outcome: Resolved     Problem: Infection (Surgery Nonspecified)  Goal: Absence of Infection Signs/Symptoms  Outcome: Resolved     Problem: Pain (Surgery Nonspecified)  Goal: Acceptable Pain Control  Outcome: Resolved     Problem: Hemodynamic Instability (Hemodialysis)  Goal: Vital Signs Remain in Desired Range  Outcome: Resolved

## 2018-12-11 NOTE — Unmapped (Signed)
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 *  Delayed Allograft Function    OR Procedures:    Left - RENAL ALLOTRANSPLANTATION, IMPLANTATION OF GRAFT; WITHOUT RECIPIENT NEPHRECTOMY  Left - BACKBNCH STD PREP CAD DONR RENAL ALLOGFT PRIOR TO TRNSPLNT, INCL DISSEC/REM PERINEPH FAT, DIAPH/RTPER ATTAC  Date  12/05/2018  -------------------     Ancillary Procedures: no procedures    Discharge Day Services: The patient was seen and examined by the Transplant Surgery team on the day of discharge. Vital signs and laboratory values were stable and at baseline. Wounds were examined and erythema was improving. Discharge plan was discussed, instructions were given and all questions answered. Drain teaching was performed by the nursing staff. Patient underwent hemodialysis in the hospital prior to discharge.    Subjective   No acute events overnight. Pain Controlled. No fever or chills. Feels well. Very little urine volume, but was able to void spontaneously.    Objective   Patient Vitals for the past 8 hrs:   BP Temp Temp src Pulse Resp   12/11/18 1227 92/57 37.6 ??C Oral 68 ???   12/11/18 1213 96/59 ??? ??? 62 ???   12/11/18 1200 80/53 ??? ??? 59 17   12/11/18 1130 86/57 ??? ??? 64 17   12/11/18 1100 89/57 ??? ??? 60 17   12/11/18 1030 83/50 ??? ??? 59 18   12/11/18 1000 84/55 ??? ??? 57 19   12/11/18 0930 98/58 ??? ??? 56 19   12/11/18 0900 84/49 ??? ??? 59 17   12/11/18 0845 93/61 ??? ??? 61 17   12/11/18 0822 103/69 36.7 ??C Oral 62 17 I/O this shift:  In: -   Out: 2680 [Urine:100; Drains:80; Other:2500]    General Appearance:   Patient lying in bed, in no acute distress  Lungs:                          Clear to auscultation bilaterally  Heart:                           Regular rate and rhythm, MAP adequate >55, HDS  Abdomen:                     Soft, non-tender, non-distended, surgical incision C/D/I with staples. JP drain with ss output  GU:                              Voiding spontaneously, minimal volume  Extremities:                  Warm and well perfused, no edema  Psych:  Mood and affect appropriate  Neuro:                           Speech and memory intact, no focal deficits    Hospital Course: The patient was taken to the OR on 12/05/2018 for kidney transplantation. She tolerated the procedure well, was extubated in the OR, and was taken to the PACU where she received routine postoperative care. She was then transferred to the Surgical Stepdown unit for close observation and cardiorespiratory monitoring. The allograft was evaluated with ultrasonography and found to have adequate perfusion with appropriate resistive indices.   She was initially placed on 1:1 UOP/IVF, but produced minimal urine and therefore only received 1/2 MIVF overnight. Ionized calcium was trended due to history of hyperparathyroidism and remained within normal limited. Her blood pressures remained at her baseline of approximately 80-90 mmHg systolic, but due to lack of graft function, she was placed on low dose dopamine infusion 3-5 mcg/kg/hr on POD1 to support both blood pressure and renal perfusion. Repeat renal ultrasound on POD2 showed continued adequate perfusion and intra-renal flow, despite persistent lack of urine output. Graft function continued to be absent, and dopamine infusion was stopped on POD2 due to ADR, while home midodrine was resumed. On the evening of POD2, she began showing clinical evidence of volume overload, and a round of hemodialysis was performed in-room. She was clinically stable afterwards and was transferred to the floor on POD#4 after receiving a second round of hemodialysis the previous night. She did well thereafter and  diet was slowly advanced and at the time of discharge she was tolerating a regular diet. The patient was able to void a very small volume spontaneously following discontinuation of Foley catheter POD#5, have her pain controlled with PO pain medication, and return to her preoperative ambulatory status. Anti-rejection medication levels were monitored and dosages adjusted to maintain a therapeutic regimen. She completed a short three-day steroid taper post-operatively. The patient was seen and assessed by Physical Therapy and deemed suitable for discharge with home physical therapy, given some baseline gait unsteadiness that the patient attributed to being unfamiliar with her surroundings. She will be discharged home on POD#6 in stable condition with home healthcare, after completing a round of dialysis in-home. She was discharged with JP drain in place and instructed to resume her home HD regimen until urine output increases.    Condition at Discharge: Same  Discharge Medications:      Medication List      START taking these medications    ??? acetaminophen 500 MG tablet; Commonly known as: TYLENOL; Take 2 tablets   (1,000 mg total) by mouth every six (6) hours as needed for pain.  ??? aspirin 81 MG tablet; Commonly known as: ECOTRIN; Take 1 tablet (81 mg   total) by mouth daily.  ??? docusate sodium 100 MG capsule; Commonly known as: COLACE; Take 1   capsule (100 mg total) by mouth two (2) times a day as needed for   constipation.  ??? gabapentin 100 MG capsule; Commonly known as: NEURONTIN; Take 1 capsule   (100 mg total) by mouth nightly for 9 days.  ??? magnesium (amino acid chelate) 133 mg Tab; Generic drug: magnesium   oxide-Mg AA chelate; Take 1 tablet by mouth Two (2) times a day. HOLD   until directed to start by your coordinator.  ??? MYFORTIC 180 MG EC tablet; Generic drug: mycophenolate; Take 3 tablets   (540 mg  total) by mouth Two (2) times a day.  ??? polyethylene glycol 17 gram/dose powder; Commonly known as: MIRALAX;   Take 17 g by mouth daily as needed.  ??? PROGRAF 1 MG capsule; Generic drug: tacrolimus; Take 2 capsules (2 mg   total) by mouth two (2) times a day.  ??? sulfamethoxazole-trimethoprim 400-80 mg per tablet; Commonly known as:   BACTRIM; Take 1 tablet (80 mg of trimethoprim total) by mouth 3 (three)   times a week.; Start taking on: December 12, 2018  ??? valGANciclovir 450 mg tablet; Commonly known as: VALCYTE; Take 1 tablet   (450 mg total) by mouth daily.     CHANGE how you take these medications    ??? midodrine 5 MG tablet; Commonly known as: PROAMATINE; Take 3 tablets (15   mg total) by mouth Three (3) times a day.; What changed: how much to take     CONTINUE taking these medications    ??? calcitrioL 0.25 MCG capsule; Commonly known as: ROCALTROL; Take 1   capsule (0.25 mcg total) by mouth daily.  ??? carboxymethylcellulose sodium 1 % Dpge; Commonly known as: REFRESH   CELLUVISC  ??? GONAK 2.5 % ophthalmic solution; Generic drug: hypromellose  ??? omeprazole 40 MG capsule; Commonly known as: PriLOSEC; Take 1 capsule   (40 mg total) by mouth daily.     STOP taking these medications    ??? cinacalcet 30 MG tablet; Commonly known as: SENSIPAR  ??? EPOGEN 20,000 unit/mL injection; Generic drug: epoetin alfa  ??? heparin (porcine) 1,000 unit/500 mL infusion  ??? mupirocin 2 % ointment; Commonly known as: BACTROBAN  ??? sevelamer 800 mg tablet; Commonly known as: RENVELA  ??? VIT B CMPLEX 3-FA-C-BIOT-ZN OX ORAL  ??? vitamin C 250 MG tablet; Generic drug: ascorbic acid (vitamin C)       Pending Test Results: None    Discharge Instructions:    Other Instructions     Discharge instructions      Activity: Do not lift > 10-15 lbs for 1st 6 weeks, then gradually increase to 25 lbs over the following 6 weeks. Resume heavy lifting only after being cleared to do so at follow-up appointment in Transplant Surgery clinic.    Diet: Renal diet    Your Post-Transplant Coordinator is [employer]. Contact your transplant coordinator or the Transplant Surgery Office [phone]) during business hours or page the transplant coordinator on call [phone]) after business hours for:    - fever >100.5 degrees F by mouth, any fever with shaking chills, or other signs or symptoms of infection   - uncontrolled nausea, vomiting, or diarrhea; inability to have a bowel movement for > 3 days.   - any problem that prevents taking medications as scheduled.   - pain uncontrolled with prescribed medication or new pain or tenderness at the surgical site   - sudden weight gain or increase in blood pressure (greater than 140/85)   - shortness of breath, chest pain / discomfort   - new or increasing jaundice   - urinary symptoms including pain / difficulty / burning or tea-colored urine   - any other new or concerning symptoms   - questions regarding your medications or continuing care      Patient may shower, but should not immerse wounds in bath or pool for 2-3 weeks. Wash the surgical site with mild soap and water, but do not scrub vigorously.    You may dress wounds with dry gauze and tape to avoid soilage.  Do not drive or operate heavy machinery prior to MD clearance, or at any time while taking narcotics.    Inspect surgical sites at least twice daily, contact Transplant Coordinator for spreading redness, purulent discharge, or increasing bleeding or drainage, or for separation of wounds.     Maintain a written record of daily vital signs, per Handbook instructions.     Maintain a written record of medications taken and review against the discharge medications sheet (orange paper). Periodically review your Transplant Handbook for important information regarding postoperative care and required precautions.      Labs and Other Follow-ups after Discharge:   Kidney  Labs 3x week: CBC, BMP, Mg, Phos and Tacrolimus trough  Labs every 3 months: Hepatic function panel    Transplant Coordinator:  Margaretha Glassing- phone: [phone] fax: [phone]             Labs and Other Follow-ups after Discharge:  Follow Up instructions and Outpatient Referrals     Discharge instructions      Referral to Home Health      Disciplines requested:  Physical Therapy    Physical Therapy requested:  Evaluate and treat    Physician to follow patient's care:  Referring Provider    Requested start of care date:  Routine (within 48 hours)          Future Appointments:  Appointments which have been scheduled for you    Dec 17, 2018  9:00 AM EST  (Arrive by 8:30 AM)  LAB ONLY with SURTRA LAB ONLY  [facility] TRANSPLANT SURGERY [place] [facility] REGION) [address]  [name] [address]  [phone]      Dec 17, 2018 10:30 AM EST  (Arrive by 10:00 AM)  RETURN  30 with Bertram Denver, FNP  [facility] TRANSPLANT SURGERY [facility] REGION) [address]  [name] [address]  [phone]      Dec 17, 2018  1:00 PM EST  (Arrive by 12:30 PM)  RETURN PHARMD W MD with Seward Speck, PharmD  [facility] TRANSPLANT SURGERY [place] [facility] REGION) [address]  [name] [address]  [phone]      Jan 10, 2019  1:30 PM EDT  (Arrive by 1:15 PM)  CYSTO STENT REMOVAL with Phillips Grout, MD  [facility] UROLOGY PROCEDURES [facility] REGION) [address]  [name] [address]  [phone]      Jan 16, 2019  8:45 AM EDT  (Arrive by 8:30 AM)  LAB ONLY with LAB WALK-IN ACC  LAB PHLEB 1ST [facility] REGION) [address]  [name] [address]  [phone]      Jan 16, 2019  9:30 AM EDT  (Arrive by 9:15 AM)  RETURN NUTRITION with Piedad Climes, RD/LDN  [facility] NUTRITION KIDNEY SPECIALTY TRANSPLANT CLINIC [facility] REGION) [address] [name] [address]  [phone]      Jan 16, 2019 10:00 AM EDT  (Arrive by 9:45 AM)  Nila Nephew W MD with Seward Speck, PharmD  [facility] KIDNEY TRANSPLANT ACC [address] [facility] REGION) [address] [name] [address]  [phone]      Jan 16, 2019 11:00 AM EDT  (Arrive by 10:45 AM)  NEW NEPHROLOGY POST with Leeroy Bock, MD  [facility] KIDNEY TRANSPLANT ACC [address] [facility]  REGION) [address]  [name] [address]  [phone]       Additional instructions:      The below services have been ordered for you by your medical team to help your health and safety at home.    Dialysis Facility: [facility]  Phone:  [phone]  Start of Care:  12/12/18  Schedule: resume home hemodialysis therapy schedule   Special Instructions:  None    Please contact your Post-Transplant Coordinator if you have any problems/concerns:  Margaretha Glassing  [phone]; [phone]

## 2018-12-11 NOTE — Unmapped (Signed)
POC reviewed with patient at beginning of shift and prn. Denies pain. Hypotensive overnight, patient reports no adverse symptoms during readings, HR wnl. Ambulating with standby assist. Abdominal incision remains c/d/I. Reports bowel movement on previous shift. One un-recorded void overnight, otherwise oliguric. Tolerating diet without any episodes of nausea or vomiting. Does report heartburn, relieved by PRN tums and Lidocaine. Will continue to monitor.     Problem: Adult Inpatient Plan of Care  Goal: Plan of Care Review  Outcome: Progressing  Goal: Patient-Specific Goal (Individualization)  Outcome: Progressing  Goal: Absence of Hospital-Acquired Illness or Injury  Outcome: Progressing  Goal: Optimal Comfort and Wellbeing  Outcome: Progressing  Goal: Readiness for Transition of Care  Outcome: Progressing  Goal: Rounds/Family Conference  Outcome: Progressing

## 2018-12-11 NOTE — Unmapped (Signed)
HD Tx 3:30 with UF goal of 2.5 liters.

## 2018-12-11 NOTE — Unmapped (Signed)
HEMODIALYSIS NURSE PROCEDURE NOTE       Treatment Number:  3 Room / Station:  9    Procedure Date:  12/11/18 Device Name/Number: Kendal Hymen    Total Dialysis Treatment Time:  208 Min.    CONSENT:    Written consent was obtained prior to the procedure and is detailed in the medical record.  Prior to the start of the procedure, a time out was taken and the identity of the patient was confirmed via name, medical record number and date of birth.     WEIGHT:  Hemodialysis Pre-Treatment Weights     Date/Time Pre-Treatment Weight (kg) Estimated Dry Weight (kg) Patient Goal Weight (kg) Total Goal Weight (kg)    12/11/18 0821  78.9 kg (173 lb 15.1 oz)  ??? TBD  2.5 kg (5 lb 8.2 oz)  3.05 kg (6 lb 11.6 oz)         Hemodialysis Post Treatment Weights     Date/Time Post-Treatment Weight (kg) Treatment Weight Change (kg)    12/11/18 1227  76.2 kg (167 lb 15.9 oz)  -2.71 kg        Active Dialysis Orders (168h ago, onward)     Start     Ordered    12/10/18 0903  Hemodialysis inpatient  Every Tue,Thu,Sat     Question Answer Comment   K+ 2 meq/L    Ca++ 2.5 meq/L    Bicarb Other (please specify) 30   Na+ 137 meq/L    Na+ Modeling none    Dialyzer F180NR    Dialysate Temperature (C) 36    BFR-As tolerated to a maximum of: 400 mL/min    DFR 800 mL/min    Duration of treatment 3.5 Hr    Dry weight (kg) tbd    Challenge dry weight (kg) none    Fluid removal (L) 2.5L on 12/11/18    Tubing Adult = 142 ml    Access Site AVF    Access Site Location Other (please specify)    Keep SBP >: 80        12/10/18 0902    12/07/18 1742  Hemodialysis inpatient  Every Tue,Thu,Sat,   Status:  Canceled     Question Answer Comment   K+ 2 meq/L    Ca++ 2.5 meq/L    Bicarb Other (please specify) 30   Na+ 137 meq/L    Na+ Modeling none    Dialyzer F180NR    Dialysate Temperature (C) 36    BFR-As tolerated to a maximum of: 400 mL/min    DFR 800 mL/min    Duration of treatment 3.5 Hr    Dry weight (kg) tbd    Challenge dry weight (kg) none    Fluid removal (L) 2.5L on 12/08/18    Tubing Adult = 142 ml    Access Site AVF    Access Site Location Other (please specify)    Keep SBP >: 80        12/07/18 1741    12/06/18 2326  Dialysis Schedule Order  (Dialysis ONCE)  Once      12/06/18 2331              ASSESSMENT:  General appearance: alert  ACCESS SITE:             Arteriovenous Fistula - Vein Graft  Access Arteriovenous fistula Left;Upper Arm (Active)   Site Assessment Clean;Dry;Intact 12/11/2018 12:30 PM   AV Fistula Thrill Present;Bruit Present 12/11/2018 12:30 PM   Status Deaccessed  12/11/2018 12:30 PM   Dressing Intervention New dressing 12/11/2018 12:30 PM   Dressing Status      Clean;Dry;Intact/not removed 12/11/2018 12:30 PM   Site Condition No complications 12/11/2018 12:30 PM   Dressing Gauze 12/11/2018 12:30 PM   Dressing To Be Removed (Date/Time) remove pressure dressing LUA @1700  12/11/2018 12:30 PM          Patient Lines/Drains/Airways Status    Active Peripheral & Central Intravenous Access     Name:   Placement date:   Placement time:   Site:   Days:    Peripheral IV 12/09/18 Right;Medial Forearm   12/09/18    0911    Forearm   2               LAB RESULTS:  Lab Results   Component Value Date    NA 130 (L) 12/11/2018    K 4.3 12/11/2018    CL 96 (L) 12/11/2018    CO2 17.0 (L) 12/11/2018    BUN 82 (H) 12/11/2018    CALCIUM 9.2 12/11/2018    CAION 4.82 12/07/2018    PHOS 6.2 (H) 12/11/2018    MG 2.3 (H) 12/11/2018    PTH 73.8 (H) 12/05/2018     Lab Results   Component Value Date    WBC 10.3 12/11/2018    HGB 9.5 (L) 12/11/2018    HCT 29.0 (L) 12/11/2018    PLT 391 12/11/2018    PHART 7.38 12/05/2018    PO2ART 68.9 (L) 12/05/2018    PCO2ART 42.8 12/05/2018    HCO3ART 25 12/05/2018    BEART 0.1 12/05/2018    O2SATART 92.7 (L) 12/05/2018    APTT 33.3 12/05/2018        VITAL SIGNS:  Temperature     Date/Time Temp Temp src      12/11/18 1227  37.6 ??C (99.7 ??F)  Oral         Hemodynamics     Date/Time Pulse BP MAP (mmHg) Patient Position    12/11/18 1227  68  92/57  64  Sitting 12/11/18 1213  62  96/59  ???  Sitting    12/11/18 1200  59  80/53  ???  Sitting    12/11/18 1130  64  86/57  ???  Sitting    12/11/18 1100  60  89/57  ???  Sitting    12/11/18 1030  59  83/50  ???  Sitting    12/11/18 1000  57  84/55  ???  Sitting    12/11/18 0930  56  98/58  ???  Sitting          Oxygen Therapy     Date/Time Resp SpO2 O2 Device FiO2 (%) O2 Flow Rate (L/min)    12/11/18 1200  17  ???  None (Room air) --  ???    12/11/18 1130  17  ???  None (Room air) --  ???    12/11/18 1100  17  ???  None (Room air) --  ???    12/11/18 1030  18  ???  None (Room air) --  ???    12/11/18 1000  19  ???  None (Room air) --  ???    12/11/18 0930  19  ???  None (Room air) --  ???          Pre-Hemodialysis Assessment     Date/Time Therapy Number Dialyzer Hemodialysis Line Type All Machine Alarms Passed    12/11/18 0821  3  F-180 (98 mLs)  Adult (142 m/s)  Yes    Date/Time Air Detector Saline Line Double Clampled Hemo-Safe Applied Dialysis Flow (mL/min)    12/11/18 0821  Engaged  ???  ???  800 mL/min    Date/Time Verify Priming Solution Priming Volume Hemodialysis Independent pH Hemodialysis Machine Conductivity (mS/cm)    12/11/18 0821  0.9% NS  300 mL  6.8  13.7 mS/cm    Date/Time Hemodialysis Independent Conductivity (mS/cm) Bicarb Conductivity Residual Bleach Negative Total Chlorine    12/11/18 0821  13.8 mS/cm --  Yes  0        Pre-Hemodialysis Treatment Comments     Date/Time Pre-Hemodialysis Comments    12/11/18 0821  Alert        Hemodialysis Treatment     Date/Time Blood Flow Rate (mL/min) Arterial Pressure (mmHg) Venous Pressure (mmHg) Transmembrane Pressure (mmHg)    12/11/18 1213  200 mL/min  -146 mmHg  187 mmHg  23 mmHg    12/11/18 1200  400 mL/min  -153 mmHg  184 mmHg  29 mmHg    12/11/18 1130  400 mL/min  -151 mmHg  182 mmHg  29 mmHg    12/11/18 1100  400 mL/min  -147 mmHg  185 mmHg  26 mmHg    12/11/18 1030  400 mL/min  -145 mmHg  182 mmHg  28 mmHg    12/11/18 1025  400 mL/min  -147 mmHg  182 mmHg  25 mmHg    12/11/18 1000  400 mL/min  -140 mmHg 210 mmHg  26 mmHg    12/11/18 0930  400 mL/min  -140 mmHg  168 mmHg  34 mmHg    12/11/18 0900  400 mL/min  -132 mmHg  167 mmHg  29 mmHg    12/11/18 0845  400 mL/min  -113 mmHg  158 mmHg  30 mmHg    12/11/18 0822  360 mL/min  35 mmHg  57 mmHg  50 mmHg    Date/Time Ultrafiltration Rate (mL/hr) Ultrafiltrate Removed (mL) Dialysate Flow Rate (mL/min) KECN (Kecn)    12/11/18 1213  870 mL/hr  3050 mL  800 ml/min  ???    12/11/18 1200  870 mL/hr  2822 mL  800 ml/min  ???    12/11/18 1130  870 mL/hr  2389 mL  800 ml/min  ???    12/11/18 1100  870 mL/hr  1951 mL  800 ml/min  ???    12/11/18 1030  870 mL/hr  1525 mL  800 ml/min  ???    12/11/18 1025  870 mL/hr  1452 mL  800 ml/min  ???    12/11/18 1000  870 mL/hr  1101 mL  800 ml/min  ???    12/11/18 0930  870 mL/hr  663 mL  800 ml/min  ???    12/11/18 0900  870 mL/hr  237 mL  800 ml/min  ???    12/11/18 0845  870 mL/hr  22 mL  800 ml/min  ???    12/11/18 0822  0 mL/hr  9 mL  500 ml/min  ???        Hemodialysis Treatment Comments     Date/Time Intra-Hemodialysis Comments    12/11/18 1213  rinseback blood    12/11/18 1200  Alert, report given to [facility]    12/11/18 1130  Stable    12/11/18 1100  Alert, stable    12/11/18 1030  Alert, Dr. Burna Mortimer at bedside    12/11/18 1025  Alert, Dr.Sadolf at bedside  12/11/18 1000  Alert, stable    12/11/18 0930  alert, watching tv, vs stable    12/11/18 0900  Alert, stable    12/11/18 0845  Tx initiated via 15 ga buttonholes    12/11/18 0822  Pre HD vital signs        Post Treatment     Date/Time Rinseback Volume (mL) On Line Clearance: spKt/V Total Liters Processed (L/min) Dialyzer Clearance    12/11/18 1227  300 mL  ???  80.4 L/min  Heavily streaked        Post Hemodialysis Treatment Comments     Date/Time Post-Hemodialysis Comments    12/11/18 1227  pt stable post tx no distress        Hemodialysis I/O     Date/Time Total Hemodialysis Replacement Volume (mL) Total Ultrafiltrate Output (mL)    12/11/18 1227  ???  2500 mL          5213-5213-01 - Medicaitons Given During Treatment  (last 4 hrs)         ** No medications to display **

## 2018-12-12 ENCOUNTER — Other Ambulatory Visit: Admit: 2018-12-12 | Discharge: 2018-12-13 | Payer: MEDICARE

## 2018-12-12 DIAGNOSIS — Z940 Kidney transplant status: Principal | ICD-10-CM

## 2018-12-12 LAB — RENAL FUNCTION PANEL
ANION GAP: 11 mmol/L (ref 7–15)
BUN / CREAT RATIO: 8
CALCIUM: 9.8 mg/dL (ref 8.5–10.2)
CHLORIDE: 106 mmol/L (ref 98–107)
CO2: 22 mmol/L (ref 22.0–30.0)
EGFR CKD-EPI AA FEMALE: 9 mL/min/{1.73_m2} — ABNORMAL LOW (ref >=60)
EGFR CKD-EPI NON-AA FEMALE: 8 mL/min/{1.73_m2} — ABNORMAL LOW (ref >=60)
GLUCOSE RANDOM: 99 mg/dL (ref 65–99)
PHOSPHORUS: 4.8 mg/dL — ABNORMAL HIGH (ref 2.9–4.7)
POTASSIUM: 5.1 mmol/L — ABNORMAL HIGH (ref 3.5–5.0)
SODIUM: 139 mmol/L (ref 135–145)

## 2018-12-12 LAB — CBC W/ AUTO DIFF
BASOPHILS ABSOLUTE COUNT: 0 10*9/L (ref 0.0–0.1)
BASOPHILS RELATIVE PERCENT: 0.1 %
EOSINOPHILS ABSOLUTE COUNT: 0.2 10*9/L (ref 0.0–0.4)
EOSINOPHILS RELATIVE PERCENT: 1.4 %
HEMATOCRIT: 29.6 % — ABNORMAL LOW (ref 36.0–46.0)
HEMOGLOBIN: 10.1 g/dL — ABNORMAL LOW (ref 13.5–16.0)
LARGE UNSTAINED CELLS: 0 % (ref 0–4)
LYMPHOCYTES ABSOLUTE COUNT: 0.1 10*9/L — ABNORMAL LOW (ref 1.5–5.0)
LYMPHOCYTES RELATIVE PERCENT: 0.5 %
MEAN CORPUSCULAR HEMOGLOBIN CONC: 33.9 g/dL (ref 31.0–37.0)
MEAN CORPUSCULAR HEMOGLOBIN: 30.8 pg (ref 26.0–34.0)
MEAN CORPUSCULAR VOLUME: 90.6 fL (ref 80.0–100.0)
MEAN PLATELET VOLUME: 7.1 fL (ref 7.0–10.0)
MONOCYTES RELATIVE PERCENT: 1.3 %
NEUTROPHILS RELATIVE PERCENT: 96.3 %
PLATELET COUNT: 376 10*9/L (ref 150–440)
RED BLOOD CELL COUNT: 3.27 10*12/L — ABNORMAL LOW (ref 4.00–5.20)
RED CELL DISTRIBUTION WIDTH: 17.1 % — ABNORMAL HIGH (ref 12.0–15.0)
WBC ADJUSTED: 10.5 10*9/L (ref 4.5–11.0)

## 2018-12-12 LAB — BUN / CREAT RATIO: Urea nitrogen/Creatinine:MRto:Pt:Ser/Plas:Qn:: 8

## 2018-12-12 LAB — NEUTROPHILS RELATIVE PERCENT: Lab: 96.3

## 2018-12-12 LAB — TACROLIMUS BLOOD: Lab: 6.9

## 2018-12-12 LAB — MAGNESIUM: Magnesium:MCnc:Pt:Ser/Plas:Qn:: 2.2

## 2018-12-12 MED ORDER — SULFAMETHOXAZOLE 400 MG-TRIMETHOPRIM 80 MG TABLET
ORAL_TABLET | ORAL | 5 refills | 0.00000 days | Status: CP
Start: 2018-12-12 — End: 2019-05-09
  Filled 2018-12-11 – 2019-01-02 (×2): qty 12, 28d supply, fill #0

## 2018-12-13 LAB — DECEASED DONOR CL I&II, LOW RES
DONOR LOW RES DRW #1: 52
DONOR LOW RES DRW #2: 51
DONOR LOW RES HLA A #1: 2
DONOR LOW RES HLA A #2: 33
DONOR LOW RES HLA B #1: 7
DONOR LOW RES HLA B #2: 65
DONOR LOW RES HLA BW #1: 6
DONOR LOW RES HLA C #2: 8
DONOR LOW RES HLA DQ #1: 2
DONOR LOW RES HLA DR #1: 17
DONOR LOW RES HLA DR #2: 15

## 2018-12-13 LAB — FSAB CLASS 1 ANTIBODY SPECIFICITY: HLA CLASS 1 ANTIBODY RESULT: POSITIVE

## 2018-12-13 LAB — HLA CL2 AB RESULT: Lab: NEGATIVE

## 2018-12-13 LAB — HLA CLASS 1 ANTIBODY

## 2018-12-13 LAB — DONOR LOW RES HLA DPB #2

## 2018-12-13 NOTE — Unmapped (Signed)
[facility] Nephrology Hemodialysis Procedure Note     12/11/2018    Kimberly [name] was seen and examined on hemodialysis    CHIEF COMPLAINT: End Stage Renal Disease    INTERVAL HISTORY: Seen on HD. Plan for D/C post treatment.     DIALYSIS TREATMENT DATA:  Estimated Dry Weight (kg): (TBD)  Patient Goal Weight (kg): 2.5 kg (5 lb 8.2 oz)  Dialyzer: F-180 (98 mLs)  Dialysis Bath  Bath: 2 K+ / 2.5 Ca+  Dialysate Na (mEq/L): 137 mEq/L  Sodium Modeling (mEq/L): (N/A)  Dialysate HCO3 (mEq/L): 26 mEq/L  Dialysate Total Buffer HCO3 (mEq/L): 30 mEq/L  Blood Flow Rate (mL/min): 200 mL/min  Dialysis Flow (mL/min): 800 mL/min    PHYSICAL EXAM:  Vitals:     Weights:  Pre-Treatment Weight (kg): 78.9 kg (173 lb 15.1 oz)    General: Appearing in no acute distress  Pulmonary: tachypnea  Cardiovascular: regular rate and rhythm  Extremities: no significant  edema  Access: LUE AV fistula    LAB DATA:  Lab Results   Component Value Date    NA 139 12/12/2018    K 5.1 (H) 12/12/2018    CL 106 12/12/2018    CO2 22.0 12/12/2018    BUN 46 (H) 12/12/2018    CREATININE 6.05 (H) 12/12/2018      Lab Results   Component Value Date    HCT 29.6 (L) 12/12/2018    WBC 10.5 12/12/2018        ASSESSMENT/PLAN:  End Stage Renal Disease on Intermittent Hemodialysis:  UF goal: 2.5L as tolerated  Adjust medications for a GFR <10  Avoid nephrotoxic agents     Bone Mineral Metabolism:  Lab Results   Component Value Date    CALCIUM 9.8 12/12/2018    CALCIUM 9.2 12/11/2018    Lab Results   Component Value Date    ALBUMIN 4.0 12/12/2018    ALBUMIN 4.9 12/05/2018      Lab Results   Component Value Date    PHOS 4.8 (H) 12/12/2018    PHOS 6.2 (H) 12/11/2018    Lab Results   Component Value Date    PTH 73.8 (H) 12/05/2018    PTH 641 (H) 09/13/2013      Labs appropriate, no changes.    Anemia:   Lab Results   Component Value Date    HGB 10.1 (L) 12/12/2018    HGB 9.5 (L) 12/11/2018    HGB 9.5 (L) 12/10/2018    No results found for: LABIRON   No results found for: FERRITIN Anemia labs appropriate, no changes.    Archie Balboa, MD  [facility] Division of Nephrology & Hypertension

## 2018-12-13 NOTE — Unmapped (Signed)
[facility]   Patient Onboarding/Medication Counseling    Ms.Sundell is a 39 y.o. female with a kidney transplant who I am counseling today on initiation of therapy.    Medication: Myfortic 180mg , Valganciclovir 450mg , Prograf 1mg     Verified patient's date of birth / HIPAA.      Education Provided: ?    Dose/Administration discussed: Myfortic 180mg -Take 3 tablets two times a day, Valganciclovir 450mg - Take one tablet daily, Prograf 1mg -Take 2 capsules two times a day. Ms. Neuroth declined patient education about her medication today.  Stressed the importance of taking medication as prescribed and to contact provider if that changes at any time.  Discussed missed dose instructions.    Storage requirements: this medicine should be stored at room temperature.     Side effects / precautions discussed:Ms. Broden declined patient education about her medication today.    Patient will receive a drug information handout with shipment.     Handling precautions / disposal reviewed:Ms. Kreft declined patient education about her medication today. .    Drug Interactions: other medications reviewed and up to date in Epic.  No drug interactions identified, but counseled patients to avoid grapefruit and grapefruit juice.    Comorbidities/Allergies: reviewed and up to date in Epic.    Verified therapy is appropriate and should continue      Delivery Information    Medication Assistance provided: none    Anticipated copay of Myfortic $0, Prograf $0, Valganciclovir $0 reviewed with patient. Verified delivery address in Epic.    Scheduled delivery date: Patient has over 3 weeks of medication on hand at this time.  She does not need anything at this time.  Medication will be delivered via UPS to the home address in [facility].  This shipment will require a signature.      Explained the services we provide at [facility] and that each month we would call to set up refills.  Stressed importance of returning phone calls so that we could ensure they receive their medications in time each month.  Informed patient that we should be setting up refills 7-10 days prior to when they will run out of medication.  Informed patient that welcome packet will be sent.      Patient verbalized understanding of the above information as well as how to contact the pharmacy at [phone] option 4 with any questions/concerns.  The pharmacy is open Monday through Friday 8:30am-4:30pm.  A pharmacist is available 24/7 via pager to answer any clinical questions they may have.        Patient Specific Needs      ? Patient has no physical, cognitive, or cultural barriers.    ? Patient prefers to have medications discussed with  Patient     ? Patient is able to read and understand education materials at a high school level or above.    ? Patient's primary language is  English           Tera Helper  [facility] Specialty Pharmacist

## 2018-12-13 NOTE — Unmapped (Signed)
I left a message to return my call to make her aware of 2/28 appointment

## 2018-12-14 ENCOUNTER — Ambulatory Visit: Admit: 2018-12-14 | Discharge: 2018-12-15 | Payer: MEDICARE

## 2018-12-14 DIAGNOSIS — Z940 Kidney transplant status: Principal | ICD-10-CM

## 2018-12-14 LAB — RENAL FUNCTION PANEL
ALBUMIN: 4.2 g/dL (ref 3.5–5.0)
ANION GAP: 13 mmol/L (ref 7–15)
BLOOD UREA NITROGEN: 39 mg/dL — ABNORMAL HIGH (ref 7–21)
BUN / CREAT RATIO: 8
CHLORIDE: 95 mmol/L — ABNORMAL LOW (ref 98–107)
CO2: 28 mmol/L (ref 22.0–30.0)
CREATININE: 4.91 mg/dL — ABNORMAL HIGH (ref 0.60–1.00)
EGFR CKD-EPI AA FEMALE: 12 mL/min/{1.73_m2} — ABNORMAL LOW (ref >=60)
EGFR CKD-EPI NON-AA FEMALE: 10 mL/min/{1.73_m2} — ABNORMAL LOW (ref >=60)
GLUCOSE RANDOM: 110 mg/dL (ref 65–179)
PHOSPHORUS: 4.9 mg/dL — ABNORMAL HIGH (ref 2.9–4.7)
POTASSIUM: 3.8 mmol/L (ref 3.5–5.0)
SODIUM: 136 mmol/L (ref 135–145)

## 2018-12-14 LAB — CBC W/ AUTO DIFF
BASOPHILS ABSOLUTE COUNT: 0 10*9/L (ref 0.0–0.1)
BASOPHILS RELATIVE PERCENT: 0.3 %
EOSINOPHILS ABSOLUTE COUNT: 0 10*9/L (ref 0.0–0.4)
EOSINOPHILS RELATIVE PERCENT: 0.4 %
HEMATOCRIT: 26.9 % — ABNORMAL LOW (ref 36.0–46.0)
HEMOGLOBIN: 9.1 g/dL — ABNORMAL LOW (ref 13.5–16.0)
LARGE UNSTAINED CELLS: 1 % (ref 0–4)
LYMPHOCYTES RELATIVE PERCENT: 0.9 %
MEAN CORPUSCULAR HEMOGLOBIN CONC: 33.6 g/dL (ref 31.0–37.0)
MEAN CORPUSCULAR HEMOGLOBIN: 30.7 pg (ref 26.0–34.0)
MEAN CORPUSCULAR VOLUME: 91.4 fL (ref 80.0–100.0)
MEAN PLATELET VOLUME: 7.8 fL (ref 7.0–10.0)
MONOCYTES ABSOLUTE COUNT: 0.2 10*9/L (ref 0.2–0.8)
NEUTROPHILS ABSOLUTE COUNT: 7.3 10*9/L (ref 2.0–7.5)
NEUTROPHILS RELATIVE PERCENT: 96 %
PLATELET COUNT: 459 10*9/L — ABNORMAL HIGH (ref 150–440)
RED BLOOD CELL COUNT: 2.95 10*12/L — ABNORMAL LOW (ref 4.00–5.20)

## 2018-12-14 LAB — MAGNESIUM: Magnesium:MCnc:Pt:Ser/Plas:Qn:: 2.1

## 2018-12-14 LAB — CO2: Carbon dioxide:SCnc:Pt:Ser/Plas:Qn:: 28

## 2018-12-14 LAB — EOSINOPHILS ABSOLUTE COUNT: Lab: 0

## 2018-12-14 LAB — TACROLIMUS BLOOD: Lab: 4.6

## 2018-12-17 ENCOUNTER — Ambulatory Visit: Admit: 2018-12-17 | Discharge: 2018-12-17 | Payer: MEDICARE

## 2018-12-17 ENCOUNTER — Institutional Professional Consult (permissible substitution): Admit: 2018-12-17 | Discharge: 2018-12-17 | Payer: MEDICARE

## 2018-12-17 ENCOUNTER — Other Ambulatory Visit: Admit: 2018-12-17 | Discharge: 2018-12-17 | Payer: MEDICARE

## 2018-12-17 DIAGNOSIS — Z9889 Other specified postprocedural states: Secondary | ICD-10-CM

## 2018-12-17 DIAGNOSIS — E559 Vitamin D deficiency, unspecified: Secondary | ICD-10-CM

## 2018-12-17 DIAGNOSIS — Z940 Kidney transplant status: Principal | ICD-10-CM

## 2018-12-17 DIAGNOSIS — D899 Disorder involving the immune mechanism, unspecified: Secondary | ICD-10-CM

## 2018-12-17 DIAGNOSIS — Z0000 Encounter for general adult medical examination without abnormal findings: Secondary | ICD-10-CM

## 2018-12-17 LAB — COMPREHENSIVE METABOLIC PANEL
ALBUMIN: 4 g/dL (ref 3.5–5.0)
ALKALINE PHOSPHATASE: 91 U/L (ref 38–126)
ALT (SGPT): 114 U/L — ABNORMAL HIGH (ref <35)
ANION GAP: 12 mmol/L (ref 7–15)
AST (SGOT): 53 U/L — ABNORMAL HIGH (ref 14–38)
BILIRUBIN TOTAL: 0.5 mg/dL (ref 0.0–1.2)
BLOOD UREA NITROGEN: 38 mg/dL — ABNORMAL HIGH (ref 7–21)
BUN / CREAT RATIO: 7
CALCIUM: 9.3 mg/dL (ref 8.5–10.2)
CHLORIDE: 94 mmol/L — ABNORMAL LOW (ref 98–107)
CO2: 30 mmol/L (ref 22.0–30.0)
CREATININE: 5.14 mg/dL — ABNORMAL HIGH (ref 0.60–1.00)
EGFR CKD-EPI AA FEMALE: 11 mL/min/{1.73_m2} — ABNORMAL LOW (ref >=60)
EGFR CKD-EPI NON-AA FEMALE: 10 mL/min/{1.73_m2} — ABNORMAL LOW (ref >=60)
GLUCOSE RANDOM: 102 mg/dL (ref 70–179)
PROTEIN TOTAL: 6.7 g/dL (ref 6.5–8.3)
SODIUM: 136 mmol/L (ref 135–145)

## 2018-12-17 LAB — CBC W/ AUTO DIFF
BASOPHILS ABSOLUTE COUNT: 0 10*9/L (ref 0.0–0.1)
BASOPHILS RELATIVE PERCENT: 0.3 %
EOSINOPHILS RELATIVE PERCENT: 0.4 %
HEMATOCRIT: 29.3 % — ABNORMAL LOW (ref 36.0–46.0)
HEMOGLOBIN: 9.3 g/dL — ABNORMAL LOW (ref 12.0–16.0)
LARGE UNSTAINED CELLS: 1 % (ref 0–4)
LYMPHOCYTES ABSOLUTE COUNT: 0.1 10*9/L — ABNORMAL LOW (ref 1.5–5.0)
LYMPHOCYTES RELATIVE PERCENT: 1.2 %
MEAN CORPUSCULAR HEMOGLOBIN CONC: 31.6 g/dL (ref 31.0–37.0)
MEAN CORPUSCULAR HEMOGLOBIN: 29.9 pg (ref 26.0–34.0)
MEAN PLATELET VOLUME: 9.1 fL (ref 7.0–10.0)
MONOCYTES ABSOLUTE COUNT: 0.1 10*9/L — ABNORMAL LOW (ref 0.2–0.8)
MONOCYTES RELATIVE PERCENT: 1.4 %
NEUTROPHILS ABSOLUTE COUNT: 8.1 10*9/L — ABNORMAL HIGH (ref 2.0–7.5)
NEUTROPHILS RELATIVE PERCENT: 96.2 %
PLATELET COUNT: 631 10*9/L — ABNORMAL HIGH (ref 150–440)
RED BLOOD CELL COUNT: 3.1 10*12/L — ABNORMAL LOW (ref 4.00–5.20)
RED CELL DISTRIBUTION WIDTH: 16.9 % — ABNORMAL HIGH (ref 12.0–15.0)
WBC ADJUSTED: 8.4 10*9/L (ref 4.5–11.0)

## 2018-12-17 LAB — VITAMIN D, TOTAL (25OH): Lab: 9.5 — ABNORMAL LOW

## 2018-12-17 LAB — TACROLIMUS, TROUGH: Lab: 6.5

## 2018-12-17 LAB — URINALYSIS
GLUCOSE UA: NEGATIVE
HYALINE CASTS: 4 /LPF — ABNORMAL HIGH (ref 0–1)
NITRITE UA: NEGATIVE
PROTEIN UA: 100 — AB
RBC UA: 19 /HPF — ABNORMAL HIGH (ref <=4)
SPECIFIC GRAVITY UA: 1.023 (ref 1.003–1.030)
SQUAMOUS EPITHELIAL: 1 /HPF (ref 0–5)
UROBILINOGEN UA: 0.2
WBC UA: 72 /HPF — ABNORMAL HIGH (ref 0–5)

## 2018-12-17 LAB — COLOR

## 2018-12-17 LAB — LIPID PANEL
CHOLESTEROL/HDL RATIO SCREEN: 4.2 (ref <5.0)
CHOLESTEROL: 196 mg/dL (ref 100–199)
HDL CHOLESTEROL: 47 mg/dL (ref 40–59)
LDL CHOLESTEROL CALCULATED: 118 mg/dL — ABNORMAL HIGH (ref 60–99)
TRIGLYCERIDES: 153 mg/dL — ABNORMAL HIGH (ref 1–149)
VLDL CHOLESTEROL CAL: 30.6 mg/dL (ref 8–32)

## 2018-12-17 LAB — NEUTROPHILS RELATIVE PERCENT: Lab: 96.2

## 2018-12-17 LAB — PHOSPHORUS: Phosphate:MCnc:Pt:Ser/Plas:Qn:: 4.7

## 2018-12-17 LAB — MAGNESIUM: Magnesium:MCnc:Pt:Ser/Plas:Qn:: 1.9

## 2018-12-17 LAB — CYCLOSPORINE, TROUGH: Lab: <10 — ABNORMAL LOW

## 2018-12-17 LAB — NON-HDL CHOLESTEROL: Cholesterol.non HDL:MCnc:Pt:Ser/Plas:Qn:: 149

## 2018-12-17 LAB — EGFR CKD-EPI AA FEMALE: Lab: 11 — ABNORMAL LOW

## 2018-12-17 LAB — PROTEIN/CREAT RATIO, URINE: Protein/Creatinine:MRto:Pt:Urine:Qn:: 0.418

## 2018-12-17 LAB — PROTEIN / CREATININE RATIO, URINE: PROTEIN/CREAT RATIO, URINE: 0.418

## 2018-12-17 NOTE — Unmapped (Signed)
[facility] TRANSPLANT CLINIC PHARMACY NOTE  12/17/2018   Kimberly [name]  [id number]    Medication changes today:   1. Increase tacrolimus to 3 mg qAM and 2 mg qPM    Education/Adherence tools provided today:  1.provided additional pill box education  2. provided updated medication list  3.  provided additional education on immunosuppression and transplant related medications including reviewing indications of medications, dosing and side effects    Follow up items:  1. goal of understanding indications and dosing of immunosuppression medications  2. Monitor SCr for improvement and if need to renally adjust Valcyte  3. BP and if can come off of midodrine  4. Repeat LFTs    Next visit with pharmacy in 1-2 weeks  ____________________________________________________________________    Kimberly [name] is a 38 y.o. 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  First kidney clotted off and 2 rejected.    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    Seen by pharmacy today for: medication management and pill box fill and adherence education; last seen by pharmacy first visit     CC:  Patient complains of mild abdominal pain around incision site    There were no vitals filed for this visit.    Allergies   Allergen Reactions   ??? Ancef [Cefazolin] Shortness Of Breath   ??? Adhesive Tape-Silicones      Other reaction(s): Other (See Comments)  Uncoded Allergy. Allergen: plastic tape, Other Reaction: blistering   ??? Demerol [Meperidine] Nausea And Vomiting       All medications reviewed and updated.     Medication list includes revisions made during today???s encounter    Outpatient Encounter Medications as of 12/17/2018   Medication Sig Dispense Refill   ??? acetaminophen (TYLENOL) 500 MG tablet Take 2 tablets (1,000 mg total) by mouth every six (6) hours as needed for pain. 100 tablet 0   ??? aspirin (ECOTRIN) 81 MG tablet Take 1 tablet (81 mg total) by mouth daily. 30 tablet 11   ??? calcitrioL (ROCALTROL) 0.25 MCG capsule Take 1 capsule (0.25 mcg total) by mouth daily. 30 capsule 11   ??? carboxymethylcellulose sodium (REFRESH CELLUVISC) 1 % DpGe Instill drops in affected eye(s) as directed as needed.     ??? docusate sodium (COLACE) 100 MG capsule Take 1 capsule (100 mg total) by mouth two (2) times a day as needed for constipation. 30 capsule 2   ??? gabapentin (NEURONTIN) 100 MG capsule Take 1 capsule (100 mg total) by mouth nightly for 9 days. 9 capsule 0   ??? hypromellose (GONAK) 2.5 % ophthalmic solution Apply to eye continuous as needed.     ??? magnesium oxide-Mg AA chelate (MAGNESIUM, AMINO ACID CHELATE,) 133 mg Tab Take 1 tablet by mouth Two (2) times a day. HOLD until directed to start by your coordinator. 100 tablet 11   ??? midodrine (PROAMATINE) 5 MG tablet Take 3 tablets (15 mg total) by mouth Three (3) times a day. 270 tablet 11   ??? MYFORTIC 180 mg EC tablet Take 3 tablets (540 mg total) by mouth Two (2) times a day. 180 tablet 11   ??? omeprazole (PRILOSEC) 40 MG capsule Take 1 capsule (40 mg total) by mouth daily. 30 capsule 11   ??? polyethylene glycol (MIRALAX) 17 gram/dose powder Take 17 g by mouth daily  as needed. 255 g 0   ??? PROGRAF 1 mg capsule Take 2 capsules (2 mg total) by mouth two (2) times a day. 300 capsule 11   ??? sulfamethoxazole-trimethoprim (BACTRIM) 400-80 mg per tablet Take 1 tablet (80 mg of trimethoprim total) by mouth 3 (three) times a week. 12 tablet 5   ??? valGANciclovir (VALCYTE) 450 mg tablet Take 1 tablet (450 mg total) by mouth daily. 30 tablet 2     No facility-administered encounter medications on file as of 12/17/2018.        Induction agent : alemtuzumab    CURRENT IMMUNOSUPPRESSION: tacrolimus 2 mg PO BID  prograf/cyclosporine goal: 8-10   myfortic540  mg PO bid    steroid free     Patient is tolerating immunosuppression well IMMUNOSUPPRESSION DRUG LEVELS:  Lab Results   Component Value Date    Tacrolimus, Trough 7.8 12/11/2018    Tacrolimus, Trough 9.0 12/10/2018    Tacrolimus, Trough 11.6 12/09/2018    Tacrolimus, Timed 4.6 12/14/2018    Tacrolimus, Timed 6.9 12/12/2018     No results found for: CYCLO  No results found for: EVEROLIMUS  No results found for: SIROLIMUS    Prograf level is accurate 12 hour trough    Graft function: complicated by DGF  DSA: ntd  Biopsies to date: zero hour biopsy WNL  WBC/ANC:  wnl    Plan: Will increase tacrolimus to 3 mg qAM and 2 mg qPM. Continue to monitor.    ID prophylaxis:   CMV Status: D+/ R+, moderate risk . CMV prophylaxis: valganciclovir 450 mg two days per week (Mon/Thurs) x 3 months per protocol (end 03/05/19)  No results found for: CMVCP  PCP Prophylaxis: bactrim SS 1 tab MWF x 6 months (end 06/05/19)  Thrush: completed in hospital  Patient is  tolerating infectious prophylaxis well    Plan: Continue per protocol. Continue to monitor.    CV Prophylaxis: asa 81 mg   The ASCVD Risk score Denman George DC Montez Hageman, et al., 2013) failed to calculate.  Statin therapy: Not indicated; currently on no statin  Plan: consider starting statin at a later date. Continue to monitor     BP: Goal < 140/90. Clinic vitals reported above  Home BP ranges:  90-100s/50-60s  Current meds include: midodrine 15 mg TID  Plan: out of goal but tolerates BP.  Hopefully BP will improve as kidney function improves. Continue to monitor    Anemia:  H/H:   Lab Results   Component Value Date    HGB 9.1 (L) 12/14/2018     Lab Results   Component Value Date    HCT 26.9 (L) 12/14/2018     Iron panel:  No results found for: IRON, TIBC, FERRITIN  No results found for: LABIRON    Prior ESA use: none post transplant    Plan: out of goal but stable. If doesn't improve by next visit can consider ESA.  Continue to monitor.     DM:   Lab Results   Component Value Date    A1C 5.1 12/05/2018   . Goal A1c < 7  History of Dm? No  Diet: did not address Exercise:not yet  Fluid intake: 1-1.3L daily  Plan:  Continue to monitor.  As SCr improves will need to increase fluid intake    Electrolytes: wnl  Meds currently on: none  Plan: Continue to monitor     GI/BM: pt reports no diarrhea; stools are soft but formed  Meds currently on: omeprazole  40 mg daily  Plan:Remove docusate and miralax from list.  Continue to monitor    Pain: pt reports mild pain near incision site  Meds currently on: APAP PRN (using ~1 daily), gabapentin HS per protocol  Plan: Continue to monitor    Bone health:   Vitamin D Level: 9.5 on 12/17/18. Goal > 30.   Last DEXA results:  none available  Current meds include: calcitriol 0.25 mcg daily  Plan: Vitamin D level  out of goal,consider increasing calcitriol to 0.5 mcg daily at next visit. Continue to monitor.     Dry eye  meds currently on: hypromellose drops PRN    Women's/Men's Health:  Lacosta Hargan is a 38 y.o. Female of childbearing age. Patient reports no desire for pregnancy and does not have IUD or take OC.  She's aware of pregnancy risk with Myfortic  Plan: Encouraged use of barrier method and to consider another contraceptive option. Continue to monitor    Adherence: Patient has good understanding of medications; was able to independently identify names/doses of immunosuppressants and OI meds.  Patient  does fill their own pill box on a regular basis at home.  Patient brought medication card:yes  Pill box:did not bring  Plan: provided extensive adherence counseling/intervention    Spent approximately 40 minutes on educating this patient and greater than 50% was spent in direct face to face counseling regarding post transplant medication education. Questions and concerns were address to patient's satisfaction.    Patient was reviewed with Dr. Lemont Fillers, DNP who was agreement with the stated plan:     During this visit, the following was completed:   BG log data assessment  BP log data assessment  Labs ordered and evaluated complex treatment plan >1 DS   Patient education was completed for 11-24 minutes     All questions/concerns were addressed to the patient's satisfaction.  __________________________________________  Cleone Slim, PHARMD  [facility] [employer]  PAGER [phone]

## 2018-12-17 NOTE — Unmapped (Signed)
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     Immunosuppression  - will increase tacrolimus from 2mg  BID to 3mg  and 2mg  with goal 8-10  - continue myfortic 540mg  BID    DGF  -creat downtreding today is 5.14, still on home HD nightly and leaving her dry weight above goal only removing 1.5-2L  - urine slowly increasing  - no electrolyte abnormalities as a consequence of decreased UOP given adequate dialysis    Wound healing  - JP remains at daily, serosanguinous sent for JP creat and WNL  - midline incision healing well CDI and well approximated.  - managing pain well     Hypotension  - back to baseline on midodrine with systolic in 90s no dizziness or orthostasis.    Follow up: nephrology to assess for HD plan and DGF management  Labs: 3x/week    I spent 40 minutes with the patient obtaining the above history, medical record review and physical examination, and greater than 50% of the time was spent on counseling and the substance of the discussion.        Subjective  Kimberly [name] is a 39 y.o. 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     Her blood pressures remained at her baseline of approximately 80-90 mmHg systolic, but due to lack of graft function, she was placed on low dose dopamine infusion 3-5 mcg/kg/hr on POD1 to support both blood pressure and renal perfusion. Repeat renal ultrasound on POD2 showed continued adequate perfusion and intra-renal flow, despite persistent lack of urine output. Graft function continued to be absent, and dopamine infusion was stopped on POD2 due to ADR, while home midodrine was resumed. On the evening of POD2, she began showing clinical evidence of volume overload, and a round of hemodialysis was performed in-room. She was clinically stable afterwards and was transferred to the floor on POD#4 after receiving a second round of hemodialysis the previous night. She did well thereafter and  diet was slowly advanced and at the time of discharge she was tolerating a regular diet. The patient was able to void a very small volume spontaneously following discontinuation of Foley catheter POD#5, She was discharged with JP drain in place and instructed to resume her home HD regimen until urine     Brief history of prior transplants.  1995. From a living donor (Mother) got thrombosed and removed. Placed on the right.   2000. [facility]veric donor. 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   2004. [facility]adaveric donor, chronic rejection 2007, placed on the right via midline incisions    No acute complaints today, she reports feeling well and is very motivated for the kidney to continue to improve. She is tolerating home HD well. She is very knowledgeable about her IS medications.     Objective    Vitals:    12/17/18 1023   BP: 100/60   BP Site: R Arm   BP Position: Sitting   BP Cuff Size: Medium   Pulse: 85   Temp: 36.8 ??C (98.2 ??F)   TempSrc: Tympanic   Weight: 77 kg (169 lb 12.8 oz)   Height: 162.6 cm (5' 4)      Body mass index is 29.15 kg/m??.     Physical  Exam:    General Appearance:    No acute distress  Lungs:                 Clear to auscultation bilaterally  Heart:                            Regular rate and rhythm  Abdomen:                 Soft, round, appropriately tender, midline incision CDI. JP site CDI with serosanguinous drainage in bulb. ~60cc removed today, no clots.  Extremities:               Warm and well perfused, edema to anterior feet bilaterally      Data Review:  All lab results last 24 hours:    Recent Results (from the past 24 hour(s))   Lipid Panel    Collection Time: 12/17/18  9:04 AM   Result Value Ref Range    Triglycerides 153 (H) 1 - 149 mg/dL    Cholesterol 161 096 - 199 mg/dL    HDL 47 40 - 59 mg/dL    LDL Calculated 045 (H) 60 - 99 mg/dL    VLDL Cholesterol Cal 30.6 8 - 32 mg/dL    Chol/HDL Ratio 4.2 <4.0    Non-HDL Cholesterol 149 mg/dL    FASTING Yes    Magnesium Level    Collection Time: 12/17/18  9:04 AM   Result Value Ref Range    Magnesium 1.9 1.6 - 2.2 mg/dL   Phosphorus Level    Collection Time: 12/17/18  9:04 AM   Result Value Ref Range    Phosphorus 4.7 2.9 - 4.7 mg/dL   Comprehensive Metabolic Panel    Collection Time: 12/17/18  9:04 AM   Result Value Ref Range    Sodium 136 135 - 145 mmol/L    Potassium 4.1 3.5 - 5.0 mmol/L    Chloride 94 (L) 98 - 107 mmol/L    Anion Gap 12 7 - 15 mmol/L    CO2 30.0 22.0 - 30.0 mmol/L    BUN 38 (H) 7 - 21 mg/dL    Creatinine 9.81 (H) 0.60 - 1.00 mg/dL    BUN/Creatinine Ratio 7     EGFR CKD-EPI Non-African American, Female 10 (L) >=60 mL/min/1.84m2    EGFR CKD-EPI African American, Female 11 (L) >=60 mL/min/1.85m2    Glucose 102 70 - 179 mg/dL    Calcium 9.3 8.5 - 19.1 mg/dL    Albumin 4.0 3.5 - 5.0 g/dL    Total Protein 6.7 6.5 - 8.3 g/dL    Total Bilirubin 0.5 0.0 - 1.2 mg/dL    AST 53 (H) 14 - 38 U/L    ALT 114 (H) <35 U/L    Alkaline Phosphatase 91 38 - 126 U/L       Imaging:  None today        Gertie Fey, DNP, APRN, FNP-C  [facility]  [address]  [place], [name]  [address]

## 2018-12-18 LAB — CMV QUANT: Lab: 0

## 2018-12-18 LAB — CMV DNA, QUANTITATIVE, PCR

## 2018-12-18 MED ORDER — VALGANCICLOVIR 450 MG TABLET
ORAL_TABLET | 2 refills | 0 days | Status: CP
Start: 2018-12-18 — End: 2019-03-19
  Filled 2019-01-24: qty 8, 28d supply, fill #0

## 2018-12-18 MED ORDER — PROGRAF 1 MG CAPSULE
ORAL_CAPSULE | ORAL | 11 refills | 0 days | Status: CP
Start: 2018-12-18 — End: 2019-01-03

## 2018-12-18 NOTE — Unmapped (Signed)
Advised to increase tacrolimus to 3mg  AM /2 mg AM

## 2018-12-19 ENCOUNTER — Other Ambulatory Visit: Admit: 2018-12-19 | Discharge: 2018-12-20 | Payer: MEDICARE

## 2018-12-19 DIAGNOSIS — Z940 Kidney transplant status: Principal | ICD-10-CM

## 2018-12-19 LAB — COMPREHENSIVE METABOLIC PANEL
ALBUMIN: 4.1 g/dL (ref 3.5–5.0)
ALKALINE PHOSPHATASE: 98 U/L (ref 38–126)
ALT (SGPT): 75 U/L — ABNORMAL HIGH (ref <35)
AST (SGOT): 25 U/L (ref 14–38)
BILIRUBIN TOTAL: 0.5 mg/dL (ref 0.0–1.2)
BLOOD UREA NITROGEN: 35 mg/dL — ABNORMAL HIGH (ref 7–21)
BUN / CREAT RATIO: 7
CALCIUM: 9.6 mg/dL (ref 8.5–10.2)
CO2: 31 mmol/L — ABNORMAL HIGH (ref 22.0–30.0)
CREATININE: 5.04 mg/dL — ABNORMAL HIGH (ref 0.60–1.00)
EGFR CKD-EPI AA FEMALE: 12 mL/min/{1.73_m2} — ABNORMAL LOW (ref >=60)
EGFR CKD-EPI NON-AA FEMALE: 10 mL/min/{1.73_m2} — ABNORMAL LOW (ref >=60)
GLUCOSE RANDOM: 119 mg/dL (ref 65–179)
POTASSIUM: 3.7 mmol/L (ref 3.5–5.0)
PROTEIN TOTAL: 6.8 g/dL (ref 6.5–8.3)
SODIUM: 137 mmol/L (ref 135–145)

## 2018-12-19 LAB — CBC W/ AUTO DIFF
BASOPHILS ABSOLUTE COUNT: 0.1 10*9/L (ref 0.0–0.1)
BASOPHILS RELATIVE PERCENT: 0.6 %
EOSINOPHILS ABSOLUTE COUNT: 0.1 10*9/L (ref 0.0–0.4)
EOSINOPHILS RELATIVE PERCENT: 0.6 %
HEMATOCRIT: 26.1 % — ABNORMAL LOW (ref 36.0–46.0)
HEMOGLOBIN: 8.7 g/dL — ABNORMAL LOW (ref 13.5–16.0)
LARGE UNSTAINED CELLS: 0 % (ref 0–4)
LYMPHOCYTES ABSOLUTE COUNT: 0.1 10*9/L — ABNORMAL LOW (ref 1.5–5.0)
LYMPHOCYTES RELATIVE PERCENT: 0.7 %
MEAN CORPUSCULAR HEMOGLOBIN CONC: 33.5 g/dL (ref 31.0–37.0)
MEAN CORPUSCULAR HEMOGLOBIN: 31.3 pg (ref 26.0–34.0)
MEAN CORPUSCULAR VOLUME: 93.4 fL (ref 80.0–100.0)
MEAN PLATELET VOLUME: 6.9 fL — ABNORMAL LOW (ref 7.0–10.0)
MONOCYTES ABSOLUTE COUNT: 0.2 10*9/L (ref 0.2–0.8)
NEUTROPHILS ABSOLUTE COUNT: 9.2 10*9/L — ABNORMAL HIGH (ref 2.0–7.5)
NEUTROPHILS RELATIVE PERCENT: 96.1 %
PLATELET COUNT: 477 10*9/L — ABNORMAL HIGH (ref 150–440)
RED BLOOD CELL COUNT: 2.79 10*12/L — ABNORMAL LOW (ref 4.00–5.20)
RED CELL DISTRIBUTION WIDTH: 16.7 % — ABNORMAL HIGH (ref 12.0–15.0)
WBC ADJUSTED: 9.6 10*9/L (ref 4.5–11.0)

## 2018-12-19 LAB — MAGNESIUM: Magnesium:MCnc:Pt:Ser/Plas:Qn:: 1.9

## 2018-12-19 LAB — ANISOCYTOSIS

## 2018-12-19 LAB — BILIRUBIN TOTAL: Bilirubin:MCnc:Pt:Ser/Plas:Qn:: 0.5

## 2018-12-19 LAB — TACROLIMUS BLOOD: Lab: 5.9

## 2018-12-19 NOTE — Unmapped (Signed)
Change in Valganciclovir dosage decrease. Refill too soon until 12-29-18  . Will attempt test claim on 12/31/2018 date.

## 2018-12-19 NOTE — Unmapped (Signed)
Spoke with patient about dose decrease on Valganciclovir to one tablet daily on Mondays and Thursday.  Patient acknowledged understanding dose change.  Will adjust refill call accordingly.

## 2018-12-21 ENCOUNTER — Ambulatory Visit: Admit: 2018-12-21 | Discharge: 2018-12-21 | Payer: MEDICARE

## 2018-12-21 ENCOUNTER — Ambulatory Visit: Admit: 2018-12-21 | Discharge: 2018-12-21 | Payer: MEDICARE | Attending: Nephrology | Primary: Nephrology

## 2018-12-21 DIAGNOSIS — T8619 Other complication of kidney transplant: Secondary | ICD-10-CM

## 2018-12-21 DIAGNOSIS — E213 Hyperparathyroidism, unspecified: Secondary | ICD-10-CM

## 2018-12-21 DIAGNOSIS — Z940 Kidney transplant status: Principal | ICD-10-CM

## 2018-12-21 DIAGNOSIS — D899 Disorder involving the immune mechanism, unspecified: Secondary | ICD-10-CM

## 2018-12-21 DIAGNOSIS — I9589 Other hypotension: Secondary | ICD-10-CM

## 2018-12-21 DIAGNOSIS — Z0000 Encounter for general adult medical examination without abnormal findings: Secondary | ICD-10-CM

## 2018-12-21 LAB — COLOR

## 2018-12-21 LAB — CBC W/ AUTO DIFF
BASOPHILS ABSOLUTE COUNT: 0.1 10*9/L (ref 0.0–0.1)
BASOPHILS RELATIVE PERCENT: 1 %
EOSINOPHILS ABSOLUTE COUNT: 0.1 10*9/L (ref 0.0–0.4)
EOSINOPHILS RELATIVE PERCENT: 0.8 %
HEMATOCRIT: 27.4 % — ABNORMAL LOW (ref 36.0–46.0)
HEMOGLOBIN: 8.9 g/dL — ABNORMAL LOW (ref 12.0–16.0)
LARGE UNSTAINED CELLS: 1 % (ref 0–4)
LYMPHOCYTES ABSOLUTE COUNT: 0.1 10*9/L — ABNORMAL LOW (ref 1.5–5.0)
LYMPHOCYTES RELATIVE PERCENT: 1.1 %
MEAN CORPUSCULAR HEMOGLOBIN CONC: 32.3 g/dL (ref 31.0–37.0)
MEAN CORPUSCULAR HEMOGLOBIN: 30.4 pg (ref 26.0–34.0)
MEAN CORPUSCULAR VOLUME: 94.1 fL (ref 80.0–100.0)
MEAN PLATELET VOLUME: 8.1 fL (ref 7.0–10.0)
MONOCYTES ABSOLUTE COUNT: 0.2 10*9/L (ref 0.2–0.8)
MONOCYTES RELATIVE PERCENT: 2.9 %
NEUTROPHILS ABSOLUTE COUNT: 7 10*9/L (ref 2.0–7.5)
NEUTROPHILS RELATIVE PERCENT: 93.3 %
PLATELET COUNT: 504 10*9/L — ABNORMAL HIGH (ref 150–440)
RED CELL DISTRIBUTION WIDTH: 16.2 % — ABNORMAL HIGH (ref 12.0–15.0)
WBC ADJUSTED: 7.5 10*9/L (ref 4.5–11.0)

## 2018-12-21 LAB — WBC ADJUSTED: Lab: 7.5

## 2018-12-21 LAB — URINALYSIS
GLUCOSE UA: NEGATIVE
HYALINE CASTS: 1 /LPF (ref 0–1)
NITRITE UA: NEGATIVE
PROTEIN UA: 100 — AB
RBC UA: <1 /HPF (ref <=4)
SPECIFIC GRAVITY UA: 1.02 (ref 1.003–1.030)
SQUAMOUS EPITHELIAL: 20 /HPF — ABNORMAL HIGH (ref 0–5)
UROBILINOGEN UA: 0.2
WBC UA: >100 /HPF — ABNORMAL HIGH (ref 0–5)

## 2018-12-21 LAB — COMPREHENSIVE METABOLIC PANEL
ALBUMIN: 3.9 g/dL (ref 3.5–5.0)
ALKALINE PHOSPHATASE: 84 U/L (ref 38–126)
ALT (SGPT): 51 U/L — ABNORMAL HIGH (ref <35)
ANION GAP: 12 mmol/L (ref 7–15)
AST (SGOT): 21 U/L (ref 14–38)
BILIRUBIN TOTAL: 0.4 mg/dL (ref 0.0–1.2)
BUN / CREAT RATIO: 5
CALCIUM: 9.4 mg/dL (ref 8.5–10.2)
CHLORIDE: 94 mmol/L — ABNORMAL LOW (ref 98–107)
CO2: 32 mmol/L — ABNORMAL HIGH (ref 22.0–30.0)
CREATININE: 5.79 mg/dL — ABNORMAL HIGH (ref 0.60–1.00)
EGFR CKD-EPI AA FEMALE: 10 mL/min/{1.73_m2} — ABNORMAL LOW (ref >=60)
EGFR CKD-EPI NON-AA FEMALE: 9 mL/min/{1.73_m2} — ABNORMAL LOW (ref >=60)
GLUCOSE RANDOM: 102 mg/dL (ref 70–179)
POTASSIUM: 3.8 mmol/L (ref 3.5–5.0)
PROTEIN TOTAL: 6.6 g/dL (ref 6.5–8.3)
SODIUM: 138 mmol/L (ref 135–145)

## 2018-12-21 LAB — TACROLIMUS, TROUGH: Lab: 6.5

## 2018-12-21 LAB — MAGNESIUM: Magnesium:MCnc:Pt:Ser/Plas:Qn:: 1.7

## 2018-12-21 LAB — PROTEIN / CREATININE RATIO, URINE
CREATININE, URINE: 266.6 mg/dL
PROTEIN URINE: 199 mg/dL

## 2018-12-21 LAB — TACROLIMUS LEVEL, TROUGH: TACROLIMUS, TROUGH: 6.5 ng/mL (ref 5.0–15.0)

## 2018-12-21 LAB — PHOSPHORUS: Phosphate:MCnc:Pt:Ser/Plas:Qn:: 4.5

## 2018-12-21 LAB — CO2: Carbon dioxide:SCnc:Pt:Ser/Plas:Qn:: 32 — ABNORMAL HIGH

## 2018-12-21 LAB — PROTEIN/CREAT RATIO, URINE: Protein/Creatinine:MRto:Pt:Urine:Qn:: 0.746

## 2018-12-21 NOTE — Unmapped (Signed)
Transplant Nephrology Clinic Visit      Assessment and Plan    Renal transplant status. deceased donor kidney transplant on 12/05/2018. 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 Making 100-200 mL of urine per day although was completely anuric pre-transplant. She has chronic hypotension requiring midodrine, likely propagating her delayed recovery, although with her multiple prior transplants, she is at higher risk for rejection  - plan for biopsy on 3/2 to elucidate cause of ongoing DGF  - encouraged her to continue to keep herself above EDW to promote increased renal perfusion although given her edema today, recommended reducing target weight by 1kg  - on bactrim for PJP prophylaxis until 06/05/2019 and on valcyte for CMV prophylaxis until 03/05/2019    Immunosuppression. Currently on tacrolimus 3/2 and mycophenolate 540mg  bid. Most recent tacrolimus trough 5.9, at which time dose was increased  - continue immunosuppression at current doses pending today's trough level    Chronic hypotension. Was on midodrine pre-transplant which was increased to 15mg  tid during her hospitalization. BP today 101/56 which is similar to home blood pressures  - continue midodrine 15mg  tid    Secondary hyperparathyroidism. On calcitriol 0.63mcg daily. Ca, phos wnl  - continue calcitriol    Follow-up in 4 weeks    Transplant History:    Date of transplant: 12/05/2018  Native kidney disease: reflux nephropathy  Type of transplant: deceased donor kidney transplant  Prior transplant biopsies: zero-hour only - no abnormalities  History of rejection: none  Presence of DSAs: not yet checked    History of CMV viremia: no  History of BK viremia/nephropathy: no     Donor information:   KDPI:  39%  Cr: initial: 1.3  peak:  2.0  current: 1.7  terminal: unknown  CMV+, EBV+, all other serologies negative    Immune suppression: tacrolimus and mycophenolate    History of Present Illness    Kimberly [name] is a 38 y.o. 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 She has a history of three prior kidney transplants. Received a living donor transplant from her mother in 25 that failed due to a thrombotic complication with subsequent nephrectomy in 1996-04-20. In 04-20-97, she received a deceased donor transplant that failed due to chronic rejection two years later, and a third deceased donor transplant in 2003-04-21 that failed again due to chronic rejection three years later. She was subsequently on home hemodialysis.    Her 4th transplant has been complicated by delayed graft function. She has baseline hypotension requiring scheduled midodrine three times daily and was significantly hypotensive post-operatively. She was started on dopamine for about 24 hours after her transplant. She made very little urine at the time of discharge and continued home hemodialysis after discharge.    She has continued 5x weekly home hemodialysis. She has kept herself about 2kg above her previous dry weight but continues to have some edema in her extremities. Blood pressures have remained mostly 90's systolic with occasional 100's.    Review of Systems    10 systems reviewed and negative except as noted in HPI    Medications    Current Outpatient Medications   Medication Sig Dispense Refill   ??? acetaminophen (TYLENOL) 500 MG tablet Take 2 tablets (1,000 mg total) by mouth every six (6) hours as needed for pain. 100 tablet 0   ??? aspirin (ECOTRIN) 81 MG tablet  Take 1 tablet (81 mg total) by mouth daily. 30 tablet 11   ??? calcitrioL (ROCALTROL) 0.25 MCG capsule Take 1 capsule (0.25 mcg total) by mouth daily. 30 capsule 11   ??? carboxymethylcellulose sodium (REFRESH CELLUVISC) 1 % DpGe Instill drops in affected eye(s) as directed as needed.     ??? gabapentin (NEURONTIN) 100 MG capsule Take 1 capsule (100 mg total) by mouth nightly for 9 days. 9 capsule 0   ??? hypromellose (GONAK) 2.5 % ophthalmic solution Apply to eye continuous as needed.     ??? magnesium oxide-Mg AA chelate (MAGNESIUM, AMINO ACID CHELATE,) 133 mg Tab Take 1 tablet by mouth Two (2) times a day. HOLD until directed to start by your coordinator. 100 tablet 11   ??? midodrine (PROAMATINE) 5 MG tablet Take 3 tablets (15 mg total) by mouth Three (3) times a day. 270 tablet 11   ??? MYFORTIC 180 mg EC tablet Take 3 tablets (540 mg total) by mouth Two (2) times a day. 180 tablet 11   ??? omeprazole (PRILOSEC) 40 MG capsule Take 1 capsule (40 mg total) by mouth daily. 30 capsule 11   ??? PROGRAF 1 mg capsule Take 3 capsules (3 mg total) by mouth daily AND 2 capsules (2 mg total) nightly. 150 capsule 11   ??? sulfamethoxazole-trimethoprim (BACTRIM) 400-80 mg per tablet Take 1 tablet (80 mg of trimethoprim total) by mouth 3 (three) times a week. 12 tablet 5   ??? valGANciclovir (VALCYTE) 450 mg tablet Take 1 tablet by mouth on Mondays and Thursdays 8 tablet 2     No current facility-administered medications for this visit.        Physical Exam    BP 101/56 (BP Site: R Arm, BP Position: Sitting, BP Cuff Size: Medium)  - Pulse 86  - Temp 36.5 ??C (97.7 ??F) (Temporal)  - Ht 163.8 cm (5' 4.5)  - Wt 75.6 kg (166 lb 9.6 oz)  - LMP 12/06/2018 (Exact Date)  - BMI 28.16 kg/m??   General: well-appearing, in no acute distress  HEENT: anicteric sclera, oropharynx clear without lesions  CV: RRR, no m/r/g, 1-2+ BLE edema  Pulm: CTAB, normal wob  GI: soft, non-tender, non-distended  Skin: no visible lesions or rashes  Psych: alert, engaged, appropriate mood and affect     Laboratory Results/Imaging  Labs personally reviewed.  Results for orders placed or performed in visit on 12/19/18   Magnesium Level   Result Value Ref Range    Magnesium 1.9 1.6 - 2.2 mg/dL   Tacrolimus level   Result Value Ref Range    Tacrolimus, Timed 5.9 ng/mL   Comprehensive Metabolic Panel   Result Value Ref Range    Sodium 137 135 - 145 mmol/L    Potassium 3.7 3.5 - 5.0 mmol/L    Chloride 94 (L) 98 - 107 mmol/L    Anion Gap 12 7 - 15 mmol/L    CO2 31.0 (H) 22.0 - 30.0 mmol/L    BUN 35 (H) 7 - 21 mg/dL    Creatinine 1.61 (H) 0.60 - 1.00 mg/dL    BUN/Creatinine Ratio 7     EGFR CKD-EPI Non-African American, Female 10 (L) >=60 mL/min/1.60m2    EGFR CKD-EPI African American, Female 12 (L) >=60 mL/min/1.51m2    Glucose 119 65 - 179 mg/dL    Calcium 9.6 8.5 - 09.6 mg/dL    Albumin 4.1 3.5 - 5.0 g/dL    Total Protein 6.8 6.5 - 8.3 g/dL  Total Bilirubin 0.5 0.0 - 1.2 mg/dL    AST 25 14 - 38 U/L    ALT 75 (H) <35 U/L    Alkaline Phosphatase 98 38 - 126 U/L   CBC w/ Differential   Result Value Ref Range    WBC 9.6 4.5 - 11.0 10*9/L    RBC 2.79 (L) 4.00 - 5.20 10*12/L    HGB 8.7 (L) 13.5 - 16.0 g/dL    HCT 38.7 (L) 56.4 - 46.0 %    MCV 93.4 80.0 - 100.0 fL    MCH 31.3 26.0 - 34.0 pg    MCHC 33.5 31.0 - 37.0 g/dL    RDW 33.2 (H) 95.1 - 15.0 %    MPV 6.9 (L) 7.0 - 10.0 fL    Platelet 477 (H) 150 - 440 10*9/L    Neutrophils % 96.1 %    Lymphocytes % 0.7 %    Monocytes % 1.6 %    Eosinophils % 0.6 %    Basophils % 0.6 %    Absolute Neutrophils 9.2 (H) 2.0 - 7.5 10*9/L    Absolute Lymphocytes 0.1 (L) 1.5 - 5.0 10*9/L    Absolute Monocytes 0.2 0.2 - 0.8 10*9/L    Absolute Eosinophils 0.1 0.0 - 0.4 10*9/L    Absolute Basophils 0.1 0.0 - 0.1 10*9/L    Large Unstained Cells 0 0 - 4 %    Anisocytosis Slight (A) Not Present       Xr Chest Portable    Result Date: 12/07/2018  EXAM: XR CHEST PORTABLE DATE: 12/07/2018 7:21 AM ACCESSION: [id number] UN DICTATED: 12/07/2018 8:07 AM INTERPRETATION LOCATION: [facility]     CLINICAL INDICATION: 38 years old Female with HYPOXEMIA      COMPARISON: Multiple priors most recent chest radiograph 12/07/2018     TECHNIQUE: Portable Chest Radiograph.     FINDINGS:     Slightly improved bibasilar opacities. Mild pulmonary edema.     No pleural effusion or pneumothorax     Stable cardiomediastinal silhouette.     Vascular stents overlie the left axillary region.                 Slightly improved bibasilar opacities. Mild pulmonary edema.    Xr Chest Portable    Result Date: 12/07/2018  EXAM: XR CHEST PORTABLE DATE: 12/06/2018 10:25 PM ACCESSION: [id number] UN DICTATED: 12/07/2018 8:05 AM INTERPRETATION LOCATION: [facility]     CLINICAL INDICATION: 38 years old Female with SHORTNESS OF BREATH      COMPARISON: Multiple priors most recent chest radiograph 12/07/2018     TECHNIQUE: Portable Chest Radiograph.     FINDINGS:     Unchanged lines and tubes.     Mild worsening of bibasilar airspace opacities. Mild pulmonary edema.     No pleural effusion or pneumothorax     Stable cardiomediastinal silhouette.     Vascular stents overlie the left axillary region.                 Mild interval worsening of pulmonary edema and bibasilar airspace opacities.    Xr Chest Portable    Result Date: 12/06/2018  EXAM: XR CHEST PORTABLE DATE: 12/06/2018 6:38 AM ACCESSION: [id number] UN DICTATED: 12/06/2018 9:03 AM INTERPRETATION LOCATION: [facility]     CLINICAL INDICATION: 38 years old Female with POSTSURGICAL STATUS      COMPARISON: CXR 12/05/2018 and earlier.     TECHNIQUE: Portable Chest Radiograph.     FINDINGS:     Mild lung hypoinflation. Slight  interval increase streaky bibasilar opacities.     No pleural effusion or pneumothorax     Stable cardiomediastinal silhouette.     Left axillary vascular stent.                 --Slight interval increased streaky bibasilar opacities, favored to represent atelectasis though aspiration is also a consideration.    Xr Chest Portable    Result Date: 12/06/2018  EXAM: XR CHEST PORTABLE DATE: 12/05/2018 10:49 PM ACCESSION: [id number] UN DICTATED: 12/06/2018 12:08 AM INTERPRETATION [facility]: [facility]     CLINICAL INDICATION: 38 years old Female with S/P Abdominal Transplant:  Check Line and ETT Placement      COMPARISON: Same day chest radiographs.     TECHNIQUE: Portable Chest Radiograph.     CONCLUSIONS:     No endotracheal tube or other additional support devices visualized.     Lungs radiographically clear.     No pleural effusion or pneumothorax.     Stable cardiomediastinal silhouette.     Addendum made by Dr. Benay Pillow at 8:18 AM on 12/06/2018: Subtle bibasal airspace opacities, likely atelectasis versus aspiration.         US Renal Transplant W Doppler    Result Date: 12/06/2018  EXAM: US RENAL TRANSPLANT W DOPPLER DATE: 12/06/2018 9:09 AM ACCESSION: [id number] UN DICTATED: 12/06/2018 9:19 AM INTERPRETATION [facility]     CLINICAL INDICATION: 38 years old Female with POD1 no output via foley     COMPARISON: 12/05/2018 renal transplant Doppler ultrasound     TECHNIQUE:  Ultrasound views of the renal transplant were obtained using gray scale and color and spectral Doppler imaging. Views of the urinary bladder were obtained using gray scale imaging.     FINDINGS:     TRANSPLANTED KIDNEY: The renal transplant was located in the left lower quadrant. Normal size and echogenicity.  No solid masses or calculi. No perinephric collections identified. No hydronephrosis.     VESSELS: - Perfusion: Using power Doppler, normal perfusion was seen throughout the visualized renal parenchyma. The inferior pole was incompletely visualized due to overlying bowel gas. - Resistive indices in the renal transplant are stable to slightly increased when compared with prior examination. - Main renal artery/iliac artery: Patent - Main renal vein/iliac vein: Patent     BLADDER: Incompletely visualized as bladder is decompressed with Foley catheter and overlying bowel gas limiting examination.         --The left lower quadrant transplant vasculature appears patent. Stable to slightly increased resistive indices within the right lower quadrant renal transplant arteries. The main renal artery RI's appear mildly elevated. No hydronephrosis or perinephric fluid collections.     Please see below for data measurements:     LLQ kidney: length 9.8 cm; width 4.4 cm; height 5.6 cm     Arcuate artery superior resistive index: 0.74 (previously 0.65) Arcuate artery mid resistive index: 0.75 (0.62) Arcuate artery inferior resistive index: 0.76 (0.67)     Previous resistive indices range of arcuate arteries: 0.62-0.67     Segmental artery superior resistive index: 0.84 (0.72) Segmental artery mid resistive index: 0.79 (0.62) Segmental artery inferior resistive index: 0.79 (0.64)     Previous resistive indices range of segmental arteries: 0.62-0.72     Main renal artery hilum resistive index: SUP:0.82/INF: 0.82 (0.84) Main renal artery mid resistive index: SUP: 0.82/INF: 0.84 (0.84) Main renal artery anastomosis resistive index: SUP: 0.89/INF: 0.86 (0.83)     Previous resistive indices range of main renal artery: 0.83-0.84  Main renal vein: patent     Iliac artery: Patent Iliac vein: Patent                 US Renal Transplant W Doppler    Result Date: 12/06/2018  EXAM: US RENAL TRANSPLANT W DOPPLER DATE: 12/05/2018 10:54 PM ACCESSION: [id number] UN DICTATED: 12/05/2018 10:57 PM [facility]PRETATION [facility]: [facility]     CLINICAL INDICATION: 38 years old Female with Post op renal transplant (L)     COMPARISON: CT abdomen pelvis 05/10/2018.     TECHNIQUE:  Ultrasound views of the renal transplant were obtained using gray scale and color and spectral Doppler imaging. Views of the urinary bladder were obtained using gray scale imaging.     FINDINGS:     TRANSPLANTED KIDNEY: The renal transplant was located in the left lower quadrant and is unremarkable. No perinephric fluid collections or transplant hydronephrosis.     VESSELS: - Perfusion: Using power Doppler, normal perfusion was seen throughout the renal parenchyma. - Resistive indices in the renal transplant are borderline elevated in the main renal arteries, within normal limits in the arcuate and segmental arteries. - Main renal artery/iliac artery: Patent - Main renal vein/iliac vein: Patent     BLADDER: Decompressed by Foley catheter.     Please see below for data measurements:     LLQ kidney: length 9.57 cm; width 4.45 cm; height 5.35 cm     Arcuate artery superior resistive index: 0.65 Arcuate artery mid resistive index: 0.62 Arcuate artery inferior resistive index: 0.67     Segmental artery superior resistive index: 0.72 Segmental artery mid resistive index: 0.62 Segmental artery inferior resistive index: 0.64     Main renal artery hilum resistive index: 0.84 Main renal artery mid resistive index: 0.84 Main renal artery anastomosis resistive index: 0.83     Main renal vein: patent     Iliac artery: Patent Iliac vein: Patent     Bladder volume prevoid: Foley in place             -- Baseline study. Patent left lower quadrant renal transplant vasculature with appropriate flow direction. Borderline elevated main renal artery resistive indices with otherwise normal resistive indices throughout, possibly reflecting edema in the immediate postoperative period. Attention on follow-up.                         Xr Chest 2 Views    Result Date: 12/05/2018  EXAM: XR CHEST 2 VIEWS DATE: 12/05/2018 9:21 AM ACCESSION: [id number] UN DICTATED: 12/05/2018 9:24 AM INTERPRETATION LOCATION: [facility]     CLINICAL INDICATION: 38 years old Female with Pre - op baseline, eval volume status      COMPARISON: Chest radiograph 04/30/2018     TECHNIQUE: PA and Lateral Chest Radiographs.     FINDINGS:     Radiographically clear lungs.     No pleural effusion or pneumothorax.     Unremarkable cardiomediastinal silhouette.     Vascular stents project over the left axillary region. Surgical clips in the upper abdomen.             No pulmonary edema.

## 2018-12-21 NOTE — Unmapped (Signed)
US Renal Transplant Biopsy Note  Date of Scheduled Biopsy: 12/24/2018  Rush?: Yes  Patient Name: Kimberly [name]  MR: [id number]  Age: 38 y.o.  Gender: Female  Race: Caucasian  Procedures: Ultrasound Guided Percutaneous Transplant Kidney Biopsy under Moderate Sedation  Tissue Submitted: Kidney  Special Studies Required: LM, IF, EM    Please contact Yohan Samons at [phone] or [phone] with the results. If unable to reach them, please contact the on call nephrology attending.  ----------------------------------------------------------------------------------------------------------------------  Date of allograft implantation: 12/05/2018  Underlying native kidney disease: reflux nephropathy  Was the diagnosis established by biopsy? no   Previous transplant biopsies? no   If yes, what were the previous diagnoses?   n/a  Previous kidney transplants: yes If yes, this is #: 4  History/Clinical Diagnosis/Indication for Biopsy:   67F with 3 prior failed kidney transplants, most recent of which failed in 2007 due to rejection. She had been on home hemodialysis subsequently. She received a deceased donor kidney transplant on 12/05/2018, but has had delayed graft function since then. Notably she has chronic hypotension on midodrine and was significant hypotensive post-op, requiring dopamine for about 24 hours and now remains on midodrine 15mg  tid. She is only making about 100-268mL since her transplant.  ----------------------------------------------------------------------------------------------------------------------  Current Baseline Immunosuppression: tacrolimus and mycophenolate  Specific anti-rejection treatment before biopsy: no   If yes, what was the type of treatment? n/a  Patient off immunosuppression?: no   Patient seems compliant? yes   Patient is currently back on hemodialysis? yes   ---------------------------------------------------------------------------------------------------------------------- Evidence of allo-antibodies? Not yet done  If yes, HLA type/MFI?:   Blood Pressure (mmHg):   BP Readings from Last 3 Encounters:   12/21/18 101/56   12/17/18 100/60   12/17/18 100/60     Urinalysis:   Lab Results   Component Value Date    Color, UA Yellow 12/21/2018    Specific Gravity, UA 1.020 12/21/2018    pH, UA 5.0 12/21/2018    Glucose, UA Negative 12/21/2018    Ketones, UA Trace (A) 12/21/2018    Blood, UA Large (A) 12/21/2018    Nitrite, UA Negative 12/21/2018    Leukocyte Esterase, UA Moderate (A) 12/21/2018    Urobilinogen, UA 0.2 mg/dL 57/84/6962    Bilirubin, UA Moderate (A) 12/21/2018     Urine protein/creatinine ratio: 0.746  Creatinine (present peak): n/a (on dialysis)  Creatinine (baseline): n/a  Lab Results   Component Value Date    CREATININE 5.79 (H) 12/21/2018    CREATININE 5.04 (H) 12/19/2018    CREATININE 5.14 (H) 12/17/2018    CREATININE 4.91 (H) 12/14/2018    CREATININE 6.05 (H) 12/12/2018     ----------------------------------------------------------------------------------------------------------------------  Clinical signs of infections at time of current biopsy:  BK: no  BK blood viral load (if positive):  CMV: no  CMV viral load (if positive):  Herpes: no   Hepatitis B: no   Hepatitis C: no   Bacteria: no   Fungi: no   Urinary tract infection: no   ----------------------------------------------------------------------------------------------------------------------  Stenosis of renal artery: no   Obstruction of ureter: no   Lymphocele: no   ----------------------------------------------------------------------------------------------------------------------  Donor Information (if available)  Type of donor: C[employer]veric  KDPI: 39%    4210 SURGICAL PATHOLOGY REQUEST FORM  DATE      [facility] Test#  CPT Code Description   4250  88331  FROZEN SECTION - SINGLE   4251  95284  FROZEN SECTIO - ADDITIONAL         4227  88300  GROSS ONLY (NO SECTION)   4228  88302  LEVEL II - GROSS & MICRO EXAM   4229 88304  LEVEL III - GROSS & MICRO EXAM   4230  88305  LEVEL IV - GROSS & MICRO EXAM   4231  88307  LEVEL V - GROSS & MICRO EXAM   4232  88309  LEVEL VI - GROSS & MICRO EXAM   4233  88311  DECALCIFICATION   4234  88321  CONSULT ON REFERRED SLIDES   4235  88323  CONSULT WITH SLIDE PREP   4236  88329  CONSULT DURING SURGERY   4237  [phone]  BONE MARROW ASPIRATION     DEPT. USE ONLY     CODE QTY MISCELLANEOUS (SPECIFY) AMOUNT

## 2018-12-24 ENCOUNTER — Ambulatory Visit: Admit: 2018-12-24 | Discharge: 2018-12-25 | Payer: MEDICARE

## 2018-12-24 DIAGNOSIS — Z79899 Other long term (current) drug therapy: Principal | ICD-10-CM

## 2018-12-24 DIAGNOSIS — Z888 Allergy status to other drugs, medicaments and biological substances status: Principal | ICD-10-CM

## 2018-12-24 DIAGNOSIS — Z7982 Long term (current) use of aspirin: Principal | ICD-10-CM

## 2018-12-24 DIAGNOSIS — Z905 Acquired absence of kidney: Principal | ICD-10-CM

## 2018-12-24 DIAGNOSIS — Z1159 Encounter for screening for other viral diseases: Principal | ICD-10-CM

## 2018-12-24 DIAGNOSIS — N1330 Unspecified hydronephrosis: Principal | ICD-10-CM

## 2018-12-24 DIAGNOSIS — T8619 Other complication of kidney transplant: Principal | ICD-10-CM

## 2018-12-24 DIAGNOSIS — Z114 Encounter for screening for human immunodeficiency virus [HIV]: Principal | ICD-10-CM

## 2018-12-24 DIAGNOSIS — Z885 Allergy status to narcotic agent status: Principal | ICD-10-CM

## 2018-12-24 DIAGNOSIS — Z940 Kidney transplant status: Principal | ICD-10-CM

## 2018-12-24 LAB — BASIC METABOLIC PANEL
ANION GAP: 15 mmol/L (ref 7–15)
BUN / CREAT RATIO: 5
CALCIUM: 9.6 mg/dL (ref 8.5–10.2)
CHLORIDE: 94 mmol/L — ABNORMAL LOW (ref 98–107)
CO2: 31 mmol/L — ABNORMAL HIGH (ref 22.0–30.0)
CREATININE: 6.03 mg/dL — ABNORMAL HIGH (ref 0.60–1.00)
EGFR CKD-EPI AA FEMALE: 9 mL/min/{1.73_m2} — ABNORMAL LOW (ref >=60)
EGFR CKD-EPI NON-AA FEMALE: 8 mL/min/{1.73_m2} — ABNORMAL LOW (ref >=60)
GLUCOSE RANDOM: 101 mg/dL (ref 70–179)
POTASSIUM: 3.4 mmol/L — ABNORMAL LOW (ref 3.5–5.0)

## 2018-12-24 LAB — CBC W/ AUTO DIFF
BASOPHILS ABSOLUTE COUNT: 0.1 10*9/L (ref 0.0–0.1)
BASOPHILS RELATIVE PERCENT: 1.8 %
EOSINOPHILS ABSOLUTE COUNT: 0.1 10*9/L (ref 0.0–0.4)
EOSINOPHILS RELATIVE PERCENT: 1.4 %
HEMATOCRIT: 27.6 % — ABNORMAL LOW (ref 36.0–46.0)
HEMOGLOBIN: 9 g/dL — ABNORMAL LOW (ref 12.0–16.0)
LARGE UNSTAINED CELLS: 1 % (ref 0–4)
LYMPHOCYTES ABSOLUTE COUNT: 0 10*9/L — ABNORMAL LOW (ref 1.5–5.0)
MEAN CORPUSCULAR HEMOGLOBIN CONC: 32.6 g/dL (ref 31.0–37.0)
MEAN CORPUSCULAR HEMOGLOBIN: 30.7 pg (ref 26.0–34.0)
MEAN CORPUSCULAR VOLUME: 94.2 fL (ref 80.0–100.0)
MEAN PLATELET VOLUME: 7.5 fL (ref 7.0–10.0)
MONOCYTES ABSOLUTE COUNT: 0.1 10*9/L — ABNORMAL LOW (ref 0.2–0.8)
MONOCYTES RELATIVE PERCENT: 1.5 %
NEUTROPHILS ABSOLUTE COUNT: 5.5 10*9/L (ref 2.0–7.5)
NEUTROPHILS RELATIVE PERCENT: 94 %
RED BLOOD CELL COUNT: 2.93 10*12/L — ABNORMAL LOW (ref 4.00–5.20)
RED CELL DISTRIBUTION WIDTH: 16.4 % — ABNORMAL HIGH (ref 12.0–15.0)
WBC ADJUSTED: 5.8 10*9/L (ref 4.5–11.0)

## 2018-12-24 LAB — TACROLIMUS LEVEL, TROUGH: TACROLIMUS, TROUGH: 6.6 ng/mL (ref 5.0–15.0)

## 2018-12-24 LAB — CBC
HEMATOCRIT: 25.9 % — ABNORMAL LOW (ref 36.0–46.0)
HEMOGLOBIN: 8.3 g/dL — ABNORMAL LOW (ref 12.0–16.0)
MEAN CORPUSCULAR HEMOGLOBIN CONC: 32.2 g/dL (ref 31.0–37.0)
MEAN CORPUSCULAR HEMOGLOBIN: 30.2 pg (ref 26.0–34.0)
MEAN CORPUSCULAR VOLUME: 93.8 fL (ref 80.0–100.0)
RED BLOOD CELL COUNT: 2.76 10*12/L — ABNORMAL LOW (ref 4.00–5.20)
RED CELL DISTRIBUTION WIDTH: 15.9 % — ABNORMAL HIGH (ref 12.0–15.0)
WBC ADJUSTED: 5.7 10*9/L (ref 4.5–11.0)

## 2018-12-24 LAB — PROTIME-INR
PROTIME: 12.4 s (ref 10.2–13.1)
PROTIME: 12.4 sec (ref 10.2–13.1)

## 2018-12-24 NOTE — Unmapped (Signed)
Assessment/Plan:    Kimberly [name] is a 38 y.o. female who will undergo Ultrasound-guided transplant kidney biopsy    1. Indications and risks/benefits of procedure reviewed with patient.    2. Consent signed and present on patient's charge.   3. No cardiopulmonary or other medical contraindications present therefore will proceed with biopsy.       CC: No chief complaint on file.      HPI: Kimberly [name] is a 38 y.o. female who will undergo Ultrasound-guided transplant kidney biopsy with moderate sedation.    Allergies:  Ancef [cefazolin]; Adhesive tape-silicones; and Demerol [meperidine]    Medications:   Prior to Admission medications    Medication Sig Start Date End Date Taking? Authorizing Provider   acetaminophen (TYLENOL) 500 MG tablet Take 2 tablets (1,000 mg total) by mouth every six (6) hours as needed for pain. 12/10/18   Drake Leach, PA   aspirin (ECOTRIN) 81 MG tablet Take 1 tablet (81 mg total) by mouth daily. 12/10/18 12/10/19  Drake Leach, PA   calcitrioL (ROCALTROL) 0.25 MCG capsule Take 1 capsule (0.25 mcg total) by mouth daily. 12/10/18 12/10/19  Drake Leach, PA   carboxymethylcellulose sodium (REFRESH CELLUVISC) 1 % DpGe Instill drops in affected eye(s) as directed as needed.    Historical Provider, MD   hypromellose (GONAK) 2.5 % ophthalmic solution Apply to eye continuous as needed. 05/06/18 05/06/19  Historical Provider, MD   magnesium oxide-Mg AA chelate (MAGNESIUM, AMINO ACID CHELATE,) 133 mg Tab Take 1 tablet by mouth Two (2) times a day. HOLD until directed to start by your coordinator.  Patient not taking: Reported on 12/21/2018 12/10/18   Drake Leach, PA   midodrine (PROAMATINE) 5 MG tablet Take 3 tablets (15 mg total) by mouth Three (3) times a day. 12/10/18 12/10/19  Belva Chimes Vonderau, PA   MYFORTIC 180 mg EC tablet Take 3 tablets (540 mg total) by mouth Two (2) times a day. 12/06/18 12/06/19  Particia Nearing, MD   omeprazole (PRILOSEC) 40 MG capsule Take 1 capsule (40 mg total) by mouth daily. 12/10/18 12/10/19  Drake Leach, PA   PROGRAF 1 mg capsule Take 3 capsules (3 mg total) by mouth daily AND 2 capsules (2 mg total) nightly. 12/18/18 12/18/19  Leona Carry, MD   sulfamethoxazole-trimethoprim (BACTRIM) 400-80 mg per tablet Take 1 tablet (80 mg of trimethoprim total) by mouth 3 (three) times a week. 12/12/18 06/10/19  Drake Leach, PA   valGANciclovir (VALCYTE) 450 mg tablet Take 1 tablet by mouth on Mondays and Thursdays 12/18/18   Particia Nearing, MD       Medical History:  Past Medical History:   Diagnosis Date   ??? Bell's palsy    ??? Disease of thyroid gland    ??? ESRD (end stage renal disease) (CMS-HCC)    ??? Nonarteritic ischemic optic neuropathy    ??? Reflux nephropathy        Surgical History:  Past Surgical History:   Procedure Laterality Date   ??? AV FISTULA PLACEMENT Left     Left arm   ??? NEPHRECTOMY Bilateral    ??? PARATHYROIDECTOMY Bilateral 2010   ??? PR TRANSPLANT,PREP CADAVER RENAL GRAFT Left 12/05/2018    Procedure: [facility] STD PREP CAD DONR RENAL ALLOGFT PRIOR TO TRNSPLNT, INCL DISSEC/REM PERINEPH FAT, DIAPH/RTPER ATTAC;  Surgeon: Bufford Lope, MD;  Location: MAIN OR [place];  Service: Transplant   ??? 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 SKIN BIOPSY     ??? SKIN BIOPSY Left     left hand       Social History:  Tobacco use:   reports that she has never smoked. She has never used smokeless tobacco.  Alcohol use:   reports no history of alcohol use.  Drug use:  reports no history of drug use.    Family History:  The patient's family history includes Cancer in her maternal grandfather, maternal grandmother, paternal grandfather, and paternal grandmother; Diabetes in her father; Kidney disease in her brother and father..    ROS:  10 systems reviewed and are negative unless otherwise mentioned in HPI    ASA Grade: ASA 2 - Patient with mild systemic disease with no functional limitations      PE:    Vitals:    12/24/18 0730   BP: 113/80   Pulse: 92   Resp: 16   Temp: 36.6 ??C (97.9 ??F)     General:  Well-appearing female in NAD.  Airway assessment: Class 1 - Can visualize soft palate, fauces, uvula, and tonsillar pillars  Cardiovascular:  Regular rate and rhythm.  No murmurs, gallops or rubs.   Lungs: respirations nonlabored; clear to auscultation bilaterally

## 2018-12-24 NOTE — Unmapped (Signed)
2  RENAL CORE BIOPSYS OBTAINED

## 2018-12-24 NOTE — Unmapped (Signed)
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  Written informed consent was obtained prior to the procedure and is detailed in the medical record.  Prior to the start of the procedure, a time out was taken and the identity of the patient was confirmed via name, medical record number and date of birth.  The availability of the correct equipment was verified.    PROCEDURE:  Name: Transplant Kidney Biopsy  Description: Ultrasound-guided transplant kidney biopsy    Pre-Procedure blood specimens were sent for CBC, PT/aPTT, and type & screen.     The patient was given 2 mg of midazolam and 25 mcg of fentanyl during the procedure, and 5 mL lidocaine 1% were used for local anesthesia. Under ultrasound guidance, a 16cm 16G biopsy needle was inserted into the left-sided transplanted kidney for a total of 3 passes with 4 core specimens obtained for analysis.      COMPLICATIONS:   Complications: None; no hematoma or active bleeding visualized    The patient will be closely watched in the recovery area, kept flat for 6 hours while wearing an abdominal binder, and will have a repeat CBC and ultrasound in 4 hours to insure no hemodynamically significant bleeding post-biopsy.    SPECIMEN(S):  Core #1: 1.6 x 0.1 x 0.1 (IF 0.3, LM 1.3)  Core #2: 1.5 x 0.1 x 0.1 (EM 0.2, LM 1.3)  Core #3: 1.0 x 0.1 x 0.1 (LM 1.0)  Core #4: 0.4 x 0.1 x 0.1 (LM 0.4)

## 2018-12-24 NOTE — Unmapped (Signed)
START PROCEDURE

## 2018-12-25 NOTE — Unmapped (Signed)
Preliminary biopsy shows only some mild tubular injury. No signs of rejection. C4d negative. Some RBC casts of unclear significance. Additional stains and EM pending.     Of note, tacrolimus level slightly below goal. Will have patient increase dose to 3mg  bid.

## 2018-12-26 ENCOUNTER — Ambulatory Visit: Admit: 2018-12-26 | Discharge: 2018-12-27 | Payer: MEDICARE

## 2018-12-26 DIAGNOSIS — Z940 Kidney transplant status: Principal | ICD-10-CM

## 2018-12-26 LAB — CBC W/ AUTO DIFF
BASOPHILS ABSOLUTE COUNT: 0.1 10*9/L (ref 0.0–0.1)
BASOPHILS RELATIVE PERCENT: 1.7 %
BASOPHILS RELATIVE PERCENT: 1.7 % — ABNORMAL LOW (ref 7.0–10.0)
EOSINOPHILS ABSOLUTE COUNT: 0.2 10*9/L (ref 0.0–0.4)
EOSINOPHILS RELATIVE PERCENT: 2.5 %
HEMATOCRIT: 27.7 % — ABNORMAL LOW (ref 36.0–46.0)
HEMOGLOBIN: 9.2 g/dL — ABNORMAL LOW (ref 13.5–16.0)
LARGE UNSTAINED CELLS: 1 % (ref 0–4)
LYMPHOCYTES ABSOLUTE COUNT: 0 10*9/L — ABNORMAL LOW (ref 1.5–5.0)
MEAN CORPUSCULAR HEMOGLOBIN CONC: 33.3 g/dL (ref 31.0–37.0)
MEAN CORPUSCULAR VOLUME: 93.8 fL (ref 80.0–100.0)
MEAN PLATELET VOLUME: 6.9 fL — ABNORMAL LOW (ref 7.0–10.0)
MONOCYTES ABSOLUTE COUNT: 0.1 10*9/L — ABNORMAL LOW (ref 0.2–0.8)
NEUTROPHILS ABSOLUTE COUNT: 6.5 10*9/L (ref 2.0–7.5)
NEUTROPHILS RELATIVE PERCENT: 93 %
PLATELET COUNT: 432 10*9/L (ref 150–440)
RED BLOOD CELL COUNT: 2.96 10*12/L — ABNORMAL LOW (ref 4.00–5.20)
RED CELL DISTRIBUTION WIDTH: 15.9 % — ABNORMAL HIGH (ref 12.0–15.0)
WBC ADJUSTED: 7 10*9/L (ref 4.5–11.0)

## 2018-12-26 LAB — COMPREHENSIVE METABOLIC PANEL
ALBUMIN: 4.2 g/dL (ref 3.5–5.0)
ALKALINE PHOSPHATASE: 88 U/L (ref 38–126)
ANION GAP: 16 mmol/L — ABNORMAL HIGH (ref 7–15)
AST (SGOT): 19 U/L (ref 14–38)
BILIRUBIN TOTAL: 0.5 mg/dL (ref 0.0–1.2)
BLOOD UREA NITROGEN: 31 mg/dL — ABNORMAL HIGH (ref 7–21)
BUN / CREAT RATIO: 5
CALCIUM: 9.9 mg/dL (ref 8.5–10.2)
CHLORIDE: 92 mmol/L — ABNORMAL LOW (ref 98–107)
CREATININE: 6.38 mg/dL — ABNORMAL HIGH (ref 0.60–1.00)
EGFR CKD-EPI NON-AA FEMALE: 8 mL/min/{1.73_m2} — ABNORMAL LOW (ref >=60)
GLUCOSE RANDOM: 114 mg/dL — ABNORMAL HIGH (ref 65–99)
GLUCOSE RANDOM: 114 mg/dL — ABNORMAL HIGH (ref 65–99)
POTASSIUM: 3.6 mmol/L (ref 3.5–5.0)
PROTEIN TOTAL: 7 g/dL (ref 6.5–8.3)
SODIUM: 141 mmol/L (ref 135–145)

## 2018-12-26 NOTE — Unmapped (Signed)
Encounter addended by: Esaw Grandchild, RN on: 12/26/2018 11:46 AM   Actions taken: Narrator Event Log accessed, One-Step Medication filed, Orders acknowledged in Narrator

## 2018-12-27 LAB — HLA DS POST TRANSPLANT
ANTI-DONOR DRW #2 MFI: 139 MFI
ANTI-DONOR HLA-A #1 MFI: 22 MFI
ANTI-DONOR HLA-A #2 MFI: 187 MFI
ANTI-DONOR HLA-B #1 MFI: 340 MFI
ANTI-DONOR HLA-B #2 MFI: 68 MFI
ANTI-DONOR HLA-C #1 MFI: 126 MFI
ANTI-DONOR HLA-C #2 MFI: 9 MFI
ANTI-DONOR HLA-DP AG #1 MFI: 204 MFI
ANTI-DONOR HLA-DQB #1 MFI: 98 MFI
ANTI-DONOR HLA-DQB #2 MFI: 129 MFI
ANTI-DONOR HLA-DR #1 MFI: 78 MFI
ANTI-DONOR HLA-DR #2 MFI: 146 MFI

## 2018-12-27 LAB — FSAB CLASS 1 ANTIBODY SPECIFICITY: HLA CL1 ANTIBODY COMM: POSITIVE

## 2018-12-27 LAB — FSAB CLASS 2 ANTIBODY SPECIFICITY: HLA CL2 AB RESULT: POSITIVE

## 2018-12-28 ENCOUNTER — Ambulatory Visit: Admit: 2018-12-28 | Discharge: 2018-12-29 | Payer: MEDICARE

## 2018-12-28 DIAGNOSIS — Z1159 Encounter for screening for other viral diseases: Principal | ICD-10-CM

## 2018-12-28 DIAGNOSIS — Z114 Encounter for screening for human immunodeficiency virus [HIV]: Principal | ICD-10-CM

## 2018-12-28 DIAGNOSIS — Z940 Kidney transplant status: Principal | ICD-10-CM

## 2018-12-28 LAB — COMPREHENSIVE METABOLIC PANEL
ALT (SGPT): 36 U/L — ABNORMAL HIGH (ref <35)
ANION GAP: 18 mmol/L — ABNORMAL HIGH (ref 7–15)
AST (SGOT): 29 U/L (ref 14–38)
AST (SGOT): 29 U/L — ABNORMAL LOW (ref 14–38)
BILIRUBIN TOTAL: 0.7 mg/dL (ref 0.0–1.2)
BLOOD UREA NITROGEN: 52 mg/dL — ABNORMAL HIGH (ref 7–21)
BUN / CREAT RATIO: 5
CALCIUM: 10.5 mg/dL — ABNORMAL HIGH (ref 8.5–10.2)
CHLORIDE: 94 mmol/L — ABNORMAL LOW (ref 98–107)
CO2: 29 mmol/L (ref 22.0–30.0)
CREATININE: 10.16 mg/dL — ABNORMAL HIGH (ref 0.60–1.00)
EGFR CKD-EPI AA FEMALE: 5 mL/min/{1.73_m2} — ABNORMAL LOW (ref >=60)
EGFR CKD-EPI NON-AA FEMALE: 4 mL/min/{1.73_m2} — ABNORMAL LOW (ref >=60)
GLUCOSE RANDOM: 106 mg/dL (ref 65–179)
PROTEIN TOTAL: 7 g/dL (ref 6.5–8.3)
SODIUM: 141 mmol/L (ref 135–145)

## 2018-12-28 LAB — CBC W/ AUTO DIFF
BASOPHILS RELATIVE PERCENT: 1.5 %
EOSINOPHILS ABSOLUTE COUNT: 0.1 10*9/L (ref 0.0–0.4)
EOSINOPHILS RELATIVE PERCENT: 2.2 %
HEMOGLOBIN: 9 g/dL — ABNORMAL LOW (ref 13.5–16.0)
LARGE UNSTAINED CELLS: 1 % (ref 0–4)
LYMPHOCYTES ABSOLUTE COUNT: 0 10*9/L — ABNORMAL LOW (ref 1.5–5.0)
LYMPHOCYTES RELATIVE PERCENT: 0.6 %
MEAN CORPUSCULAR HEMOGLOBIN CONC: 32.6 g/dL (ref 31.0–37.0)
MEAN CORPUSCULAR HEMOGLOBIN: 30.7 pg (ref 26.0–34.0)
MEAN CORPUSCULAR VOLUME: 94.3 fL (ref 80.0–100.0)
MEAN PLATELET VOLUME: 7.4 fL (ref 7.0–10.0)
MONOCYTES ABSOLUTE COUNT: 0.1 10*9/L — ABNORMAL LOW (ref 0.2–0.8)
MONOCYTES RELATIVE PERCENT: 1.8 %
NEUTROPHILS ABSOLUTE COUNT: 4.9 10*9/L (ref 2.0–7.5)
NEUTROPHILS RELATIVE PERCENT: 93.3 %
PLATELET COUNT: 421 10*9/L (ref 150–440)
RED BLOOD CELL COUNT: 2.92 10*12/L — ABNORMAL LOW (ref 4.00–5.20)
RED CELL DISTRIBUTION WIDTH: 15.8 % — ABNORMAL HIGH (ref 12.0–15.0)
WBC ADJUSTED: 5.3 10*9/L (ref 4.5–11.0)

## 2018-12-28 NOTE — Unmapped (Signed)
Pt Cr 10.39 review with Dr Burna Mortimer; Pt was holding dialysis with possible of DGF improvement. She is making about 100 ml of urine per day. Advised to resume normal dialysis schedule

## 2018-12-31 ENCOUNTER — Ambulatory Visit: Admit: 2018-12-31 | Discharge: 2019-01-01 | Payer: MEDICARE

## 2018-12-31 DIAGNOSIS — Z940 Kidney transplant status: Principal | ICD-10-CM

## 2018-12-31 LAB — COMPREHENSIVE METABOLIC PANEL
ALBUMIN: 4.4 g/dL (ref 3.5–5.0)
ALKALINE PHOSPHATASE: 78 U/L (ref 38–126)
ALT (SGPT): 49 U/L — ABNORMAL HIGH (ref <35)
ANION GAP: 17 mmol/L — ABNORMAL HIGH (ref 7–15)
AST (SGOT): 31 U/L (ref 14–38)
AST (SGOT): 31 U/L — ABNORMAL LOW (ref 14–38)
BILIRUBIN TOTAL: 0.6 mg/dL (ref 0.0–1.2)
BLOOD UREA NITROGEN: 34 mg/dL — ABNORMAL HIGH (ref 7–21)
BUN / CREAT RATIO: 5
CALCIUM: 10.3 mg/dL — ABNORMAL HIGH (ref 8.5–10.2)
CO2: 32 mmol/L — ABNORMAL HIGH (ref 22.0–30.0)
CREATININE: 7.42 mg/dL — ABNORMAL HIGH (ref 0.60–1.00)
EGFR CKD-EPI AA FEMALE: 7 mL/min/{1.73_m2} — ABNORMAL LOW (ref >=60)
EGFR CKD-EPI NON-AA FEMALE: 6 mL/min/{1.73_m2} — ABNORMAL LOW (ref >=60)
GLUCOSE RANDOM: 98 mg/dL (ref 65–179)
POTASSIUM: 3.8 mmol/L (ref 3.5–5.0)
PROTEIN TOTAL: 7.1 g/dL (ref 6.5–8.3)
SODIUM: 140 mmol/L (ref 135–145)

## 2018-12-31 LAB — CBC W/ AUTO DIFF
BASOPHILS ABSOLUTE COUNT: 0.1 10*9/L (ref 0.0–0.1)
BASOPHILS RELATIVE PERCENT: 1.7 %
EOSINOPHILS ABSOLUTE COUNT: 0.1 10*9/L (ref 0.0–0.4)
EOSINOPHILS RELATIVE PERCENT: 2.2 %
HEMATOCRIT: 27.8 % — ABNORMAL LOW (ref 36.0–46.0)
HEMOGLOBIN: 9.3 g/dL — ABNORMAL LOW (ref 13.5–16.0)
LYMPHOCYTES ABSOLUTE COUNT: 0 10*9/L — ABNORMAL LOW (ref 1.5–5.0)
LYMPHOCYTES RELATIVE PERCENT: 0.7 %
MEAN CORPUSCULAR HEMOGLOBIN CONC: 33.4 g/dL (ref 31.0–37.0)
MEAN CORPUSCULAR HEMOGLOBIN: 31.2 pg (ref 26.0–34.0)
MEAN CORPUSCULAR VOLUME: 93.3 fL (ref 80.0–100.0)
MONOCYTES ABSOLUTE COUNT: 0.1 10*9/L — ABNORMAL LOW (ref 0.2–0.8)
MONOCYTES RELATIVE PERCENT: 1.7 %
NEUTROPHILS ABSOLUTE COUNT: 5.3 10*9/L (ref 2.0–7.5)
NEUTROPHILS RELATIVE PERCENT: 92.9 %
PLATELET COUNT: 490 10*9/L — ABNORMAL HIGH (ref 150–440)
PLATELET COUNT: 490 10*9/L — ABNORMAL HIGH (ref 150–440)
RED BLOOD CELL COUNT: 2.98 10*12/L — ABNORMAL LOW (ref 4.00–5.20)
RED CELL DISTRIBUTION WIDTH: 15.4 % — ABNORMAL HIGH (ref 12.0–15.0)
WBC ADJUSTED: 5.7 10*9/L (ref 4.5–11.0)

## 2018-12-31 LAB — HEPATITIS C RNA, QUANTITATIVE, PCR
HCV RNA COMMENT: NOT DETECTED
HCV RNA: NOT DETECTED

## 2018-12-31 NOTE — Unmapped (Signed)
[facility] Clinical Assessment & Refill Coordination Note    Kimberly [name], DOB: [birth year]  Phone: [phone] (home)     All above HIPAA information was verified with patient.     Specialty Medication(s):   Transplant: Myfortic 180mg , Prograf 1mg  and valgancyclovir 450mg      Current Outpatient Medications   Medication Sig Dispense Refill   ??? acetaminophen (TYLENOL) 500 MG tablet Take 2 tablets (1,000 mg total) by mouth every six (6) hours as needed for pain. 100 tablet 0   ??? aspirin (ECOTRIN) 81 MG tablet Take 1 tablet (81 mg total) by mouth daily. 30 tablet 11   ??? calcitrioL (ROCALTROL) 0.25 MCG capsule Take 1 capsule (0.25 mcg total) by mouth daily. 30 capsule 11   ??? carboxymethylcellulose sodium (REFRESH CELLUVISC) 1 % DpGe Instill drops in affected eye(s) as directed as needed.     ??? hypromellose (GONAK) 2.5 % ophthalmic solution Apply to eye continuous as needed.     ??? magnesium oxide-Mg AA chelate (MAGNESIUM, AMINO ACID CHELATE,) 133 mg Tab Take 1 tablet by mouth Two (2) times a day. HOLD until directed to start by your coordinator. (Patient not taking: Reported on 12/21/2018) 100 tablet 11   ??? midodrine (PROAMATINE) 5 MG tablet Take 3 tablets (15 mg total) by mouth Three (3) times a day. 270 tablet 11   ??? MYFORTIC 180 mg EC tablet Take 3 tablets (540 mg total) by mouth Two (2) times a day. 180 tablet 11   ??? omeprazole (PRILOSEC) 40 MG capsule Take 1 capsule (40 mg total) by mouth daily. 30 capsule 11   ??? PROGRAF 1 mg capsule Take 3 capsules (3 mg total) by mouth daily AND 2 capsules (2 mg total) nightly. 150 capsule 11   ??? sulfamethoxazole-trimethoprim (BACTRIM) 400-80 mg per tablet Take 1 tablet (80 mg of trimethoprim total) by mouth 3 (three) times a week. 12 tablet 5   ??? valGANciclovir (VALCYTE) 450 mg tablet Take 1 tablet by mouth on Mondays and Thursdays 8 tablet 2     No current facility-administered medications for this visit.         Changes to medications: Kimberly [name] reports starting the following medications: Prograf 1mg  - take 3 capsules two times a day    Allergies   Allergen Reactions   ??? Ancef [Cefazolin] Shortness Of Breath   ??? Adhesive Tape-Silicones      Other reaction(s): Other (See Comments)  Uncoded Allergy. Allergen: plastic tape, Other Reaction: blistering   ??? Demerol [Meperidine] Nausea And Vomiting       Changes to allergies: No    SPECIALTY MEDICATION ADHERENCE     Myfortic 180 mg: 7 days of medicine on hand   Prograf 1 mg: 4 days of medicine on hand   Valganciclovir 450 mg: 15 days of medicine on hand       Specialty medication(s) dose(s) confirmed: Patient reports changes to the regimen as follows: Prograf 3mg  two times a day     Are there any concerns with adherence? No    Adherence counseling provided? Not needed    CLINICAL MANAGEMENT AND INTERVENTION      Clinical Benefit Assessment:    Do you feel the medicine is effective or helping your condition? Yes    Clinical Benefit counseling provided? Not needed    Adverse Effects Assessment:    Are you experiencing any side effects? No    Are you experiencing difficulty administering your medicine? No  Quality of Life Assessment:    How many days over the past month did your kidney transplant  keep you from your normal activities? For example, brushing your teeth or getting up in the morning. 0    Have you discussed this with your provider? Not needed    Therapy Appropriateness:    Is therapy appropriate? Yes, therapy is appropriate and should be continued    DISEASE/MEDICATION-SPECIFIC INFORMATION      N/A    PATIENT SPECIFIC NEEDS     ? Does the patient have any physical, cognitive, or cultural barriers? No    ? Is the patient high risk? Yes, patient taking a REMS drug     ? Does the patient require a Care Management Plan? No     ? Does the patient require physician intervention or other additional services (i.e. nutrition, smoking cessation, social work)? No      SHIPPING     Specialty Medication(s) to be Shipped:   Transplant: Myfortic 180mg  and Prograf 1mg     Other medication(s) to be shipped: ASA 81mg , Calcitriol, Midodrine, SMZ-TMP, Omeprazole     Changes to insurance: No    Delivery Scheduled: Yes, Expected medication delivery date: 01/03/2019.     Medication will be delivered via UPS to the confirmed home address in [facility].    The patient will receive a drug information handout for each medication shipped and additional FDA Medication Guides as required.  Verified that patient has previously received a Conservation officer, historic buildings.    Kimberly [name]   [facility] Specialty Pharmacist

## 2019-01-01 LAB — HEPATITIS B DNA, ULTRAQUANTITATIVE, PCR

## 2019-01-02 ENCOUNTER — Ambulatory Visit
Admit: 2019-01-02 | Discharge: 2019-01-02 | Payer: MEDICARE | Attending: Transplant Surgery | Primary: Transplant Surgery

## 2019-01-02 ENCOUNTER — Ambulatory Visit: Admit: 2019-01-02 | Discharge: 2019-01-02 | Payer: MEDICARE

## 2019-01-02 DIAGNOSIS — G510 Bell's palsy: Principal | ICD-10-CM

## 2019-01-02 DIAGNOSIS — N186 End stage renal disease: Principal | ICD-10-CM

## 2019-01-02 DIAGNOSIS — Z940 Kidney transplant status: Principal | ICD-10-CM

## 2019-01-02 DIAGNOSIS — E079 Disorder of thyroid, unspecified: Principal | ICD-10-CM

## 2019-01-02 DIAGNOSIS — H47019 Ischemic optic neuropathy, unspecified eye: Principal | ICD-10-CM

## 2019-01-02 DIAGNOSIS — N13729 Vesicoureteral-reflux with reflux nephropathy without hydroureter, unspecified: Principal | ICD-10-CM

## 2019-01-02 LAB — COMPREHENSIVE METABOLIC PANEL
ALBUMIN: 4.6 g/dL (ref 3.5–5.0)
ALKALINE PHOSPHATASE: 82 U/L (ref 38–126)
ALT (SGPT): 47 U/L — ABNORMAL HIGH (ref <35)
ALT (SGPT): 47 U/L — ABNORMAL HIGH (ref 135–<35)
AST (SGOT): 29 U/L (ref 14–38)
BILIRUBIN TOTAL: 0.5 mg/dL (ref 0.0–1.2)
BLOOD UREA NITROGEN: 33 mg/dL — ABNORMAL HIGH (ref 7–21)
BUN / CREAT RATIO: 5
CALCIUM: 10.4 mg/dL — ABNORMAL HIGH (ref 8.5–10.2)
CHLORIDE: 91 mmol/L — ABNORMAL LOW (ref 98–107)
CO2: 32 mmol/L — ABNORMAL HIGH (ref 22.0–30.0)
CREATININE: 6.89 mg/dL — ABNORMAL HIGH (ref 0.60–1.00)
EGFR CKD-EPI AA FEMALE: 8 mL/min/{1.73_m2} — ABNORMAL LOW (ref >=60)
GLUCOSE RANDOM: 99 mg/dL (ref 65–179)
POTASSIUM: 3.6 mmol/L (ref 3.5–5.0)
PROTEIN TOTAL: 7.4 g/dL (ref 6.5–8.3)
SODIUM: 141 mmol/L (ref 135–145)

## 2019-01-02 LAB — CBC W/ AUTO DIFF
BASOPHILS ABSOLUTE COUNT: 0.1 10*9/L (ref 0.0–0.1)
BASOPHILS RELATIVE PERCENT: 1.6 %
EOSINOPHILS ABSOLUTE COUNT: 0.2 10*9/L (ref 0.0–0.4)
EOSINOPHILS RELATIVE PERCENT: 4.4 %
HEMATOCRIT: 28.2 % — ABNORMAL LOW (ref 36.0–46.0)
HEMOGLOBIN: 9.6 g/dL — ABNORMAL LOW (ref 13.5–16.0)
LARGE UNSTAINED CELLS: 1 % (ref 0–4)
LYMPHOCYTES ABSOLUTE COUNT: 0.1 10*9/L — ABNORMAL LOW (ref 1.5–5.0)
LYMPHOCYTES RELATIVE PERCENT: 1.1 %
MEAN CORPUSCULAR HEMOGLOBIN CONC: 34 g/dL (ref 31.0–37.0)
MEAN CORPUSCULAR HEMOGLOBIN: 31.7 pg (ref 26.0–34.0)
MEAN PLATELET VOLUME: 6.7 fL — ABNORMAL LOW (ref 7.0–10.0)
MONOCYTES RELATIVE PERCENT: 0.5 %
NEUTROPHILS ABSOLUTE COUNT: 4.7 10*9/L (ref 2.0–7.5)
NEUTROPHILS RELATIVE PERCENT: 91.7 %
RED BLOOD CELL COUNT: 3.03 10*12/L — ABNORMAL LOW (ref 4.00–5.20)
RED CELL DISTRIBUTION WIDTH: 15.4 % — ABNORMAL HIGH (ref 12.0–15.0)
RED CELL DISTRIBUTION WIDTH: 15.4 % — ABNORMAL HIGH (ref 12.0–15.0)
WBC ADJUSTED: 5.2 10*9/L (ref 4.5–11.0)

## 2019-01-02 LAB — MAGNESIUM: MAGNESIUM: 1.8 mg/dL (ref 1.6–2.2)

## 2019-01-02 MED FILL — ASPIRIN 81 MG TABLET,DELAYED RELEASE: ORAL | 30 days supply | Qty: 30 | Fill #0

## 2019-01-02 MED FILL — OMEPRAZOLE 40 MG CAPSULE,DELAYED RELEASE: ORAL | 30 days supply | Qty: 30 | Fill #0

## 2019-01-02 MED FILL — MIDODRINE 5 MG TABLET: 30 days supply | Qty: 270 | Fill #0 | Status: AC

## 2019-01-02 MED FILL — OMEPRAZOLE 40 MG CAPSULE,DELAYED RELEASE: 30 days supply | Qty: 30 | Fill #0 | Status: AC

## 2019-01-02 MED FILL — MIDODRINE 5 MG TABLET: ORAL | 30 days supply | Qty: 270 | Fill #0

## 2019-01-02 MED FILL — ASPIRIN 81 MG TABLET,DELAYED RELEASE: 30 days supply | Qty: 30 | Fill #0 | Status: AC

## 2019-01-02 MED FILL — CALCITRIOL 0.25 MCG CAPSULE: 30 days supply | Qty: 30 | Fill #0 | Status: AC

## 2019-01-02 MED FILL — SULFAMETHOXAZOLE 400 MG-TRIMETHOPRIM 80 MG TABLET: 28 days supply | Qty: 12 | Fill #0 | Status: AC

## 2019-01-02 MED FILL — CALCITRIOL 0.25 MCG CAPSULE: ORAL | 30 days supply | Qty: 30 | Fill #0

## 2019-01-03 DIAGNOSIS — Z940 Kidney transplant status: Principal | ICD-10-CM

## 2019-01-03 DIAGNOSIS — T8619 Other complication of kidney transplant: Principal | ICD-10-CM

## 2019-01-03 MED ORDER — PROGRAF 1 MG CAPSULE
ORAL_CAPSULE | 11 refills | 0 days | Status: CP
Start: 2019-01-03 — End: 2019-01-22
  Filled 2019-01-03: qty 150, 25d supply, fill #0

## 2019-01-03 MED ORDER — MYFORTIC 180 MG TABLET,DELAYED RELEASE
ORAL_TABLET | Freq: Two times a day (BID) | ORAL | 11 refills | 0 days | Status: CP
Start: 2019-01-03 — End: 2019-03-19
  Filled 2019-01-03: qty 180, 30d supply, fill #0

## 2019-01-03 MED FILL — PROGRAF 1 MG CAPSULE: 25 days supply | Qty: 150 | Fill #0 | Status: AC

## 2019-01-03 MED FILL — MYFORTIC 180 MG TABLET,DELAYED RELEASE: 30 days supply | Qty: 180 | Fill #0 | Status: AC

## 2019-01-03 NOTE — Unmapped (Signed)
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 Her ESRD is 2/2 hydronephrosis and has had 3 prior kidney transplants. Her post op course was complicated by DGF. She is on Dialysis through her left arm AVF. Her urine output is still 100 cc a day. Her drain output reduced gradually and has been 45 - 60 cc a day for the last 1 week. Her Kidney biopsy was not suggestive of rejection and it only showed mild tubular atrophy.     - US Renal Transplant??  - Continue midodrine for hypotension, prograf 3 mg BID  - Continue dialysis as per nephrology recommendation  - Drain removed under AAP in the clinic.  - Staples removed in the clinic today  - Next follow up in 3 weeks, follow up with nephrology in 1 week scheduled.    Subjective  Kimberly [name] is a 38yo female s/p DDKT on 12/05/2018. 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 Her post op course was complicated by DGF.   She has a low urine output but it has been improving gradually. She is still on HD. Her drain output has decreased in the last one week.  She denies any abdominal pain, nausea, vomiting, fever, burning micturition, chest pain, SOB.      Objective    Vitals:    01/02/19 1303   BP: 100/64   Pulse: 88   Temp: 37.2 ??C (98.9 ??F)   TempSrc: Tympanic   Weight: 72.9 kg (160 lb 12.8 oz)   Height: 162.6 cm (5' 4)      Body mass index is 27.6 kg/m??.     Physical Exam:    General Appearance:   No acute distress  Lungs:                Clear to auscultation bilaterally  Heart:                           Regular rate and rhythm  Abdomen:                Soft, non-tender, non-distended, no redness or swelling around the incision.  Extremities:              Warm and well perfused        Data Review:  All lab results last 24 hours:    Recent Results (from the past 24 hour(s))   Magnesium Level    Collection Time: 01/02/19  8:41 AM   Result Value Ref Range    Magnesium 1.8 1.6 - 2.2 mg/dL   Tacrolimus level    Collection Time: 01/02/19  8:41 AM   Result Value Ref Range    Tacrolimus, Timed 5.2 ng/mL   Comprehensive Metabolic Panel    Collection Time: 01/02/19  8:41 AM   Result Value Ref Range    Sodium 141 135 - 145 mmol/L    Potassium 3.6 3.5 - 5.0 mmol/L    Chloride 91 (L) 98 - 107 mmol/L    Anion Gap 18 (H) 7 - 15 mmol/L    CO2 32.0 (H) 22.0 - 30.0 mmol/L    BUN 33 (H) 7 - 21 mg/dL    Creatinine 1.61 (H) 0.60 - 1.00 mg/dL    BUN/Creatinine Ratio 5     EGFR CKD-EPI Non-African American, Female 7 (L) >=60 mL/min/1.9m2    EGFR CKD-EPI African  American, Female 8 (L) >=60 mL/min/1.34m2    Glucose 99 65 - 179 mg/dL    Calcium 24.4 (H) 8.5 - 10.2 mg/dL    Albumin 4.6 3.5 - 5.0 g/dL    Total Protein 7.4 6.5 - 8.3 g/dL    Total Bilirubin 0.5 0.0 - 1.2 mg/dL    AST 29 14 - 38 U/L    ALT 47 (H) <35 U/L    Alkaline Phosphatase 82 38 - 126 U/L   CBC w/ Differential    Collection Time: 01/02/19  8:41 AM   Result Value Ref Range    WBC 5.2 4.5 - 11.0 10*9/L    RBC 3.03 (L) 4.00 - 5.20 10*12/L    HGB 9.6 (L) 13.5 - 16.0 g/dL    HCT 01.0 (L) 27.2 - 46.0 %    MCV 93.0 80.0 - 100.0 fL    MCH 31.7 26.0 - 34.0 pg    MCHC 34.0 31.0 - 37.0 g/dL    RDW 53.6 (H) 64.4 - 15.0 %    MPV 6.7 (L) 7.0 - 10.0 fL    Platelet 526 (H) 150 - 440 10*9/L    Neutrophils % 91.7 %    Lymphocytes % 1.1 %    Monocytes % 0.5 %    Eosinophils % 4.4 %    Basophils % 1.6 %    Absolute Neutrophils 4.7 2.0 - 7.5 10*9/L    Absolute Lymphocytes 0.1 (L) 1.5 - 5.0 10*9/L    Absolute Monocytes 0.0 (L) 0.2 - 0.8 10*9/L    Absolute Eosinophils 0.2 0.0 - 0.4 10*9/L    Absolute Basophils 0.1 0.0 - 0.1 10*9/L    Large Unstained Cells 1 0 - 4 %         Imaging:

## 2019-01-03 NOTE — Unmapped (Signed)
New rxs sent to [facility] for myfortic (no dose change) and prograf (inc to 3mg  bid). All other meds were filled yesterday for today delivery except prograf and myfortic below last ssc note.  I called and spoke with patient, she is aware of the dose change, and runs out of prograf in the morning.  Patient is courier range, so we will same day courier the myfortic and prograf to her today. So delivery date per previous note is correct, just those 2 meds will be coming via wfd instead of ups.

## 2019-01-04 ENCOUNTER — Ambulatory Visit: Admit: 2019-01-04 | Discharge: 2019-01-05 | Payer: MEDICARE

## 2019-01-04 DIAGNOSIS — T8619 Other complication of kidney transplant: Principal | ICD-10-CM

## 2019-01-04 DIAGNOSIS — Z940 Kidney transplant status: Principal | ICD-10-CM

## 2019-01-04 LAB — COMPREHENSIVE METABOLIC PANEL
ALBUMIN: 4.6 g/dL (ref 3.5–5.0)
ALKALINE PHOSPHATASE: 75 U/L (ref 38–126)
ALT (SGPT): 38 U/L — ABNORMAL HIGH (ref <35)
ANION GAP: 16 mmol/L — ABNORMAL HIGH (ref 7–15)
AST (SGOT): 25 U/L (ref 14–38)
BILIRUBIN TOTAL: 0.5 mg/dL (ref 0.0–1.2)
BLOOD UREA NITROGEN: 35 mg/dL — ABNORMAL HIGH (ref 7–21)
BUN / CREAT RATIO: 5
CHLORIDE: 91 mmol/L — ABNORMAL LOW (ref 98–107)
CO2: 33 mmol/L — ABNORMAL HIGH (ref 22.0–30.0)
CREATININE: 6.9 mg/dL — ABNORMAL HIGH (ref 0.60–1.00)
EGFR CKD-EPI AA FEMALE: 8 mL/min/{1.73_m2} — ABNORMAL LOW (ref >=60)
EGFR CKD-EPI NON-AA FEMALE: 7 mL/min/{1.73_m2} — ABNORMAL LOW (ref >=60)
EGFR CKD-EPI NON-AA FEMALE: 7 mL/min/1.73m2 — ABNORMAL LOW (ref >=60–21)
GLUCOSE RANDOM: 99 mg/dL (ref 65–179)
POTASSIUM: 3.6 mmol/L (ref 3.5–5.0)
PROTEIN TOTAL: 7.3 g/dL (ref 6.5–8.3)
SODIUM: 140 mmol/L (ref 135–145)

## 2019-01-04 LAB — CBC W/ AUTO DIFF
BASOPHILS ABSOLUTE COUNT: 0.1 10*9/L (ref 0.0–0.1)
BASOPHILS RELATIVE PERCENT: 1.5 %
EOSINOPHILS ABSOLUTE COUNT: 0.1 10*9/L (ref 0.0–0.4)
EOSINOPHILS RELATIVE PERCENT: 1.8 %
HEMATOCRIT: 28 % — ABNORMAL LOW (ref 36.0–46.0)
HEMOGLOBIN: 9.3 g/dL — ABNORMAL LOW (ref 13.5–16.0)
LARGE UNSTAINED CELLS: 1 % (ref 0–4)
LYMPHOCYTES ABSOLUTE COUNT: 0 10*9/L — ABNORMAL LOW (ref 1.5–5.0)
MEAN CORPUSCULAR HEMOGLOBIN CONC: 33.2 g/dL (ref 31.0–37.0)
MEAN CORPUSCULAR HEMOGLOBIN: 31 pg (ref 26.0–34.0)
MEAN CORPUSCULAR VOLUME: 93.3 fL (ref 80.0–100.0)
MEAN PLATELET VOLUME: 6.7 fL — ABNORMAL LOW (ref 7.0–10.0)
NEUTROPHILS ABSOLUTE COUNT: 5.5 10*9/L (ref 2.0–7.5)
NEUTROPHILS RELATIVE PERCENT: 94 %
PLATELET COUNT: 537 10*9/L — ABNORMAL HIGH (ref 150–440)
PLATELET COUNT: 537 10*9/L — ABNORMAL HIGH (ref 150–440)
RED BLOOD CELL COUNT: 3 10*12/L — ABNORMAL LOW (ref 4.00–5.20)
RED CELL DISTRIBUTION WIDTH: 15.3 % — ABNORMAL HIGH (ref 12.0–15.0)

## 2019-01-07 ENCOUNTER — Ambulatory Visit: Admit: 2019-01-07 | Discharge: 2019-01-08 | Payer: MEDICARE

## 2019-01-07 DIAGNOSIS — Z940 Kidney transplant status: Principal | ICD-10-CM

## 2019-01-07 LAB — CBC W/ AUTO DIFF
BASOPHILS ABSOLUTE COUNT: 0.1 10*9/L (ref 0.0–0.1)
EOSINOPHILS ABSOLUTE COUNT: 0.1 10*9/L (ref 0.0–0.4)
EOSINOPHILS RELATIVE PERCENT: 1 %
HEMATOCRIT: 28.1 % — ABNORMAL LOW (ref 36.0–46.0)
LARGE UNSTAINED CELLS: 1 % (ref 0–4)
LYMPHOCYTES ABSOLUTE COUNT: 0 10*9/L — ABNORMAL LOW (ref 1.5–5.0)
LYMPHOCYTES RELATIVE PERCENT: 0.9 %
MEAN CORPUSCULAR HEMOGLOBIN CONC: 32.6 g/dL (ref 31.0–37.0)
MEAN CORPUSCULAR HEMOGLOBIN: 30.7 pg (ref 26.0–34.0)
MEAN CORPUSCULAR VOLUME: 94.1 fL (ref 80.0–100.0)
MEAN PLATELET VOLUME: 6.8 fL — ABNORMAL LOW (ref 7.0–10.0)
MONOCYTES ABSOLUTE COUNT: 0.1 10*9/L — ABNORMAL LOW (ref 0.2–0.8)
MONOCYTES RELATIVE PERCENT: 1.7 %
NEUTROPHILS ABSOLUTE COUNT: 4.4 10*9/L (ref 2.0–7.5)
NEUTROPHILS RELATIVE PERCENT: 93.6 %
PLATELET COUNT: 550 10*9/L — ABNORMAL HIGH (ref 150–440)
RED BLOOD CELL COUNT: 2.99 10*12/L — ABNORMAL LOW (ref 4.00–5.20)
RED CELL DISTRIBUTION WIDTH: 15.1 % — ABNORMAL HIGH (ref 12.0–15.0)
WBC ADJUSTED: 4.7 10*9/L (ref 4.5–11.0)

## 2019-01-07 LAB — COMPREHENSIVE METABOLIC PANEL
ALBUMIN: 4.5 g/dL (ref 3.5–5.0)
ALKALINE PHOSPHATASE: 72 U/L (ref 38–126)
ANION GAP: 18 mmol/L — ABNORMAL HIGH (ref 7–15)
AST (SGOT): 23 U/L (ref 14–38)
BILIRUBIN TOTAL: 0.5 mg/dL (ref 0.0–1.2)
BLOOD UREA NITROGEN: 47 mg/dL — ABNORMAL HIGH (ref 7–21)
BLOOD UREA NITROGEN: 47 mg/dL — ABNORMAL HIGH (ref 7–21)
BUN / CREAT RATIO: 5
CALCIUM: 10.4 mg/dL — ABNORMAL HIGH (ref 8.5–10.2)
CHLORIDE: 95 mmol/L — ABNORMAL LOW (ref 98–107)
CO2: 30 mmol/L (ref 22.0–30.0)
CREATININE: 9.2 mg/dL — ABNORMAL HIGH (ref 0.60–1.00)
EGFR CKD-EPI NON-AA FEMALE: 5 mL/min/{1.73_m2} — ABNORMAL LOW (ref >=60)
GLUCOSE RANDOM: 95 mg/dL (ref 65–179)
POTASSIUM: 3.8 mmol/L (ref 3.5–5.0)
PROTEIN TOTAL: 7.2 g/dL (ref 6.5–8.3)
SODIUM: 143 mmol/L (ref 135–145)

## 2019-01-09 ENCOUNTER — Ambulatory Visit: Admit: 2019-01-09 | Discharge: 2019-01-10 | Payer: MEDICARE

## 2019-01-09 DIAGNOSIS — Z940 Kidney transplant status: Principal | ICD-10-CM

## 2019-01-09 LAB — COMPREHENSIVE METABOLIC PANEL
ALBUMIN: 4.6 g/dL (ref 3.5–5.0)
ALT (SGPT): 27 U/L (ref <35)
ANION GAP: 16 mmol/L — ABNORMAL HIGH (ref 7–15)
AST (SGOT): 21 U/L (ref 14–38)
BILIRUBIN TOTAL: 0.5 mg/dL (ref 0.0–1.2)
BLOOD UREA NITROGEN: 43 mg/dL — ABNORMAL HIGH (ref 7–21)
BUN / CREAT RATIO: 5
CALCIUM: 10.4 mg/dL — ABNORMAL HIGH (ref 8.5–10.2)
CHLORIDE: 93 mmol/L — ABNORMAL LOW (ref 98–107)
CO2: 32 mmol/L — ABNORMAL HIGH (ref 22.0–30.0)
CREATININE: 8.01 mg/dL — ABNORMAL HIGH (ref 0.60–1.00)
EGFR CKD-EPI NON-AA FEMALE: 6 mL/min/{1.73_m2} — ABNORMAL LOW (ref >=60)
EGFR CKD-EPI NON-AA FEMALE: 6 mL/min/1.73m2 — ABNORMAL LOW (ref >=60–107)
GLUCOSE RANDOM: 95 mg/dL (ref 65–179)
POTASSIUM: 3.6 mmol/L (ref 3.5–5.0)
PROTEIN TOTAL: 7.1 g/dL (ref 6.5–8.3)
SODIUM: 141 mmol/L (ref 135–145)

## 2019-01-09 LAB — CBC W/ AUTO DIFF
BASOPHILS ABSOLUTE COUNT: 0.1 10*9/L (ref 0.0–0.1)
BASOPHILS RELATIVE PERCENT: 1.9 %
EOSINOPHILS ABSOLUTE COUNT: 0.1 10*9/L (ref 0.0–0.4)
EOSINOPHILS RELATIVE PERCENT: 1.3 %
HEMATOCRIT: 28.8 % — ABNORMAL LOW (ref 36.0–46.0)
HEMOGLOBIN: 9.5 g/dL — ABNORMAL LOW (ref 13.5–16.0)
LARGE UNSTAINED CELLS: 1 % (ref 0–4)
LYMPHOCYTES ABSOLUTE COUNT: 0.1 10*9/L — ABNORMAL LOW (ref 1.5–5.0)
LYMPHOCYTES RELATIVE PERCENT: 1.3 %
MEAN CORPUSCULAR HEMOGLOBIN CONC: 32.9 g/dL (ref 31.0–37.0)
MEAN CORPUSCULAR HEMOGLOBIN CONC: 32.9 g/dL — ABNORMAL LOW (ref 31.0–37.0)
MEAN CORPUSCULAR HEMOGLOBIN: 30.8 pg (ref 26.0–34.0)
MEAN CORPUSCULAR VOLUME: 93.5 fL (ref 80.0–100.0)
MEAN PLATELET VOLUME: 7.2 fL (ref 7.0–10.0)
MONOCYTES RELATIVE PERCENT: 1.5 %
NEUTROPHILS ABSOLUTE COUNT: 4.5 10*9/L (ref 2.0–7.5)
NEUTROPHILS RELATIVE PERCENT: 92.9 %
PLATELET COUNT: 517 10*9/L — ABNORMAL HIGH (ref 150–440)
RED CELL DISTRIBUTION WIDTH: 15.1 % — ABNORMAL HIGH (ref 12.0–15.0)
WBC ADJUSTED: 4.8 10*9/L (ref 4.5–11.0)

## 2019-01-10 ENCOUNTER — Ambulatory Visit: Admit: 2019-01-10 | Discharge: 2019-01-11 | Payer: MEDICARE

## 2019-01-10 DIAGNOSIS — G510 Bell's palsy: Principal | ICD-10-CM

## 2019-01-10 DIAGNOSIS — H47019 Ischemic optic neuropathy, unspecified eye: Principal | ICD-10-CM

## 2019-01-10 DIAGNOSIS — Z940 Kidney transplant status: Principal | ICD-10-CM

## 2019-01-10 DIAGNOSIS — N13729 Vesicoureteral-reflux with reflux nephropathy without hydroureter, unspecified: Principal | ICD-10-CM

## 2019-01-10 DIAGNOSIS — Z466 Encounter for fitting and adjustment of urinary device: Principal | ICD-10-CM

## 2019-01-10 DIAGNOSIS — E079 Disorder of thyroid, unspecified: Principal | ICD-10-CM

## 2019-01-10 DIAGNOSIS — N186 End stage renal disease: Principal | ICD-10-CM

## 2019-01-10 NOTE — Unmapped (Signed)
[facility]:  Taking Care of Yourself After Cystoscopy Procedures    *Drink plenty of water for a day or two following your procedure.  Try to have about 8 ounces (one cup) at a time, and do this 6 times or more per day.  (If you have fluid restrictions, please ask the nurse or doctor for advice).    *AVOID alcoholic, carbonated and caffeinated drinks for a day or two, as they may cause uncomfortable symptoms.    *For the first 8 hours after the procedure, your urine may be pink or red in color.  Small clots or a few drops of blood can be a normal side effect of the instruments.  Large amounts of bleeding or difficulty urinating are not normal.  Call your doctor if this happens.    *You may experience some mild discomfort of a burning sensation with urination after having this procedure.  If it does not improve, or if other symptoms appear (fever, chills, or difficulty emptying), call your doctor.    *You may return to normal daily activities such as work, school, driving, exercising and housework.    *If your doctor gave you a prescription, take it as ordered.    *If you need a return appointment, the secretary will make it for you when you check out. To contact the Urology Clinic during business hours, call [phone].    *[facility] Operator can be reached at [phone] if you need to get in contact with your doctor.  After the hours, the operator can page the doctor on call for urgent concerns:    Urology Patients should ask for the Urology resident “on call”.    You can get more immediate assistance at the Emergency Room or Urgent Care if necessary.

## 2019-01-10 NOTE — Unmapped (Signed)
Procedures    Indications: 39 y.o. female s/p transplant. Here for transplant stent removal.    Cystoscopy, ureteral stent removal    Timeout was performed immediately prior to the procedure.    The patient was prepped and draped in the usual sterile fashion.  Flexible cystoscopy was performed.  The indwelling transplant ureteral stent was visualized, grasped, and removed intact.  The patient tolerated the procedure well and already took a dose of antibiotics today, so no prophylactic antibiotics needed..    Plan: Return to clinic as needed. Patient will follow-up with transplant team as previously scheduled.

## 2019-01-11 ENCOUNTER — Ambulatory Visit: Admit: 2019-01-11 | Discharge: 2019-01-12 | Payer: MEDICARE

## 2019-01-11 DIAGNOSIS — Z940 Kidney transplant status: Principal | ICD-10-CM

## 2019-01-11 LAB — CBC W/ AUTO DIFF
BASOPHILS ABSOLUTE COUNT: 0.1 10*9/L (ref 0.0–0.1)
BASOPHILS RELATIVE PERCENT: 1.5 %
EOSINOPHILS ABSOLUTE COUNT: 0 10*9/L (ref 0.0–0.4)
EOSINOPHILS RELATIVE PERCENT: 0.8 %
HEMATOCRIT: 28.2 % — ABNORMAL LOW (ref 36.0–46.0)
HEMOGLOBIN: 9.3 g/dL — ABNORMAL LOW (ref 13.5–16.0)
LARGE UNSTAINED CELLS: 1 % (ref 0–4)
LYMPHOCYTES ABSOLUTE COUNT: 0.1 10*9/L — ABNORMAL LOW (ref 1.5–5.0)
LYMPHOCYTES RELATIVE PERCENT: 1.1 %
MEAN CORPUSCULAR HEMOGLOBIN CONC: 33.2 g/dL (ref 31.0–37.0)
MEAN CORPUSCULAR HEMOGLOBIN CONC: 33.2 g/dL — ABNORMAL HIGH (ref 31.0–37.0)
MEAN CORPUSCULAR HEMOGLOBIN: 30.9 pg (ref 26.0–34.0)
MEAN CORPUSCULAR VOLUME: 93.2 fL (ref 80.0–100.0)
MONOCYTES ABSOLUTE COUNT: 0.1 10*9/L — ABNORMAL LOW (ref 0.2–0.8)
MONOCYTES RELATIVE PERCENT: 2 %
NEUTROPHILS ABSOLUTE COUNT: 4.1 10*9/L (ref 2.0–7.5)
NEUTROPHILS RELATIVE PERCENT: 93.8 %
PLATELET COUNT: 499 10*9/L — ABNORMAL HIGH (ref 150–440)
RED BLOOD CELL COUNT: 3.02 10*12/L — ABNORMAL LOW (ref 4.00–5.20)
WBC ADJUSTED: 4.4 10*9/L — ABNORMAL LOW (ref 4.5–11.0)

## 2019-01-11 LAB — COMPREHENSIVE METABOLIC PANEL
ALBUMIN: 4.4 g/dL (ref 3.5–5.0)
ALKALINE PHOSPHATASE: 72 U/L (ref 38–126)
ALT (SGPT): 21 U/L (ref <35)
ANION GAP: 16 mmol/L — ABNORMAL HIGH (ref 7–15)
AST (SGOT): 17 U/L (ref 14–38)
BLOOD UREA NITROGEN: 31 mg/dL — ABNORMAL HIGH (ref 7–21)
BUN / CREAT RATIO: 4
CALCIUM: 10.1 mg/dL (ref 8.5–10.2)
CHLORIDE: 92 mmol/L — ABNORMAL LOW (ref 98–107)
CO2: 32 mmol/L — ABNORMAL HIGH (ref 22.0–30.0)
CREATININE: 7.05 mg/dL — ABNORMAL HIGH (ref 0.60–1.00)
EGFR CKD-EPI AA FEMALE: 8 mL/min/{1.73_m2} — ABNORMAL LOW (ref >=60)
GLUCOSE RANDOM: 96 mg/dL (ref 65–179)
POTASSIUM: 3.7 mmol/L (ref 3.5–5.0)
PROTEIN TOTAL: 7 g/dL (ref 6.5–8.3)
SODIUM: 140 mmol/L (ref 135–145)

## 2019-01-14 ENCOUNTER — Ambulatory Visit: Admit: 2019-01-14 | Discharge: 2019-01-15 | Payer: MEDICARE

## 2019-01-14 DIAGNOSIS — Z940 Kidney transplant status: Principal | ICD-10-CM

## 2019-01-14 LAB — COMPREHENSIVE METABOLIC PANEL
ALBUMIN: 4.4 g/dL (ref 3.5–5.0)
ALKALINE PHOSPHATASE: 76 U/L (ref 38–126)
ALT (SGPT): 25 U/L (ref <35)
ALT (SGPT): 25 U/L — ABNORMAL LOW (ref 65–<35)
ANION GAP: 15 mmol/L (ref 7–15)
BILIRUBIN TOTAL: 0.4 mg/dL (ref 0.0–1.2)
BLOOD UREA NITROGEN: 32 mg/dL — ABNORMAL HIGH (ref 7–21)
BUN / CREAT RATIO: 4
CALCIUM: 10.1 mg/dL (ref 8.5–10.2)
CHLORIDE: 93 mmol/L — ABNORMAL LOW (ref 98–107)
CO2: 32 mmol/L — ABNORMAL HIGH (ref 22.0–30.0)
CREATININE: 7.53 mg/dL — ABNORMAL HIGH (ref 0.60–1.00)
EGFR CKD-EPI AA FEMALE: 7 mL/min/{1.73_m2} — ABNORMAL LOW (ref >=60)
EGFR CKD-EPI NON-AA FEMALE: 6 mL/min/{1.73_m2} — ABNORMAL LOW (ref >=60)
GLUCOSE RANDOM: 99 mg/dL (ref 65–179)
POTASSIUM: 4 mmol/L (ref 3.5–5.0)
PROTEIN TOTAL: 7.1 g/dL (ref 6.5–8.3)

## 2019-01-14 LAB — CBC W/ AUTO DIFF
BASOPHILS ABSOLUTE COUNT: 0.1 10*9/L (ref 0.0–0.1)
EOSINOPHILS ABSOLUTE COUNT: 0 10*9/L (ref 0.0–0.4)
EOSINOPHILS RELATIVE PERCENT: 0.8 %
HEMATOCRIT: 28.3 % — ABNORMAL LOW (ref 36.0–46.0)
HEMOGLOBIN: 9.5 g/dL — ABNORMAL LOW (ref 13.5–16.0)
LARGE UNSTAINED CELLS: 1 % (ref 0–4)
LYMPHOCYTES ABSOLUTE COUNT: 0 10*9/L — ABNORMAL LOW (ref 1.5–5.0)
LYMPHOCYTES RELATIVE PERCENT: 0.9 %
MEAN CORPUSCULAR HEMOGLOBIN CONC: 33.4 g/dL — ABNORMAL LOW (ref 31.0–37.0)
MEAN CORPUSCULAR VOLUME: 93.6 fL (ref 80.0–100.0)
MEAN PLATELET VOLUME: 7.1 fL (ref 7.0–10.0)
MONOCYTES ABSOLUTE COUNT: 0.1 10*9/L — ABNORMAL LOW (ref 0.2–0.8)
MONOCYTES RELATIVE PERCENT: 1.9 %
NEUTROPHILS ABSOLUTE COUNT: 4.5 10*9/L (ref 2.0–7.5)
NEUTROPHILS RELATIVE PERCENT: 93.9 %
RED BLOOD CELL COUNT: 3.02 10*12/L — ABNORMAL LOW (ref 4.00–5.20)
RED CELL DISTRIBUTION WIDTH: 15 % (ref 12.0–15.0)
WBC ADJUSTED: 4.8 10*9/L (ref 4.5–11.0)

## 2019-01-16 ENCOUNTER — Ambulatory Visit: Admit: 2019-01-16 | Discharge: 2019-01-17 | Payer: MEDICARE

## 2019-01-16 DIAGNOSIS — Z940 Kidney transplant status: Principal | ICD-10-CM

## 2019-01-16 LAB — COMPREHENSIVE METABOLIC PANEL
ALBUMIN: 4.4 g/dL (ref 3.5–5.0)
ALKALINE PHOSPHATASE: 79 U/L (ref 38–126)
ALT (SGPT): 26 U/L (ref <35)
ANION GAP: 15 mmol/L (ref 7–15)
AST (SGOT): 23 U/L (ref 14–38)
BILIRUBIN TOTAL: 0.4 mg/dL (ref 0.0–1.2)
BLOOD UREA NITROGEN: 34 mg/dL — ABNORMAL HIGH (ref 7–21)
BUN / CREAT RATIO: 5
CALCIUM: 9.8 mg/dL (ref 8.5–10.2)
CALCIUM: 9.8 mg/dL — ABNORMAL LOW (ref 8.5–10.2)
CHLORIDE: 92 mmol/L — ABNORMAL LOW (ref 98–107)
CO2: 31 mmol/L — ABNORMAL HIGH (ref 22.0–30.0)
CREATININE: 6.4 mg/dL — ABNORMAL HIGH (ref 0.60–1.00)
EGFR CKD-EPI AA FEMALE: 9 mL/min/{1.73_m2} — ABNORMAL LOW (ref >=60)
EGFR CKD-EPI NON-AA FEMALE: 8 mL/min/{1.73_m2} — ABNORMAL LOW (ref >=60)
GLUCOSE RANDOM: 106 mg/dL (ref 65–179)
POTASSIUM: 4.1 mmol/L (ref 3.5–5.0)
PROTEIN TOTAL: 7 g/dL (ref 6.5–8.3)
SODIUM: 138 mmol/L (ref 135–145)

## 2019-01-16 LAB — CBC W/ AUTO DIFF
BASOPHILS ABSOLUTE COUNT: 0.1 10*9/L (ref 0.0–0.1)
BASOPHILS RELATIVE PERCENT: 1.1 %
EOSINOPHILS ABSOLUTE COUNT: 0 10*9/L (ref 0.0–0.4)
EOSINOPHILS RELATIVE PERCENT: 0.8 %
HEMATOCRIT: 27.7 % — ABNORMAL LOW (ref 36.0–46.0)
HEMOGLOBIN: 9.4 g/dL — ABNORMAL LOW (ref 13.5–16.0)
LARGE UNSTAINED CELLS: 1 % (ref 0–4)
LYMPHOCYTES ABSOLUTE COUNT: 0.1 10*9/L — ABNORMAL LOW (ref 1.5–5.0)
LYMPHOCYTES RELATIVE PERCENT: 1.1 %
MEAN CORPUSCULAR HEMOGLOBIN CONC: 33.8 g/dL (ref 31.0–37.0)
MEAN CORPUSCULAR HEMOGLOBIN: 31.5 pg (ref 26.0–34.0)
MEAN CORPUSCULAR VOLUME: 93.2 fL (ref 80.0–100.0)
MONOCYTES ABSOLUTE COUNT: 0.1 10*9/L — ABNORMAL LOW (ref 0.2–0.8)
MONOCYTES RELATIVE PERCENT: 2.2 %
NEUTROPHILS ABSOLUTE COUNT: 5.3 10*9/L (ref 2.0–7.5)
NEUTROPHILS RELATIVE PERCENT: 94.1 %
PLATELET COUNT: 471 10*9/L — ABNORMAL HIGH (ref 150–440)
RED BLOOD CELL COUNT: 2.97 10*12/L — ABNORMAL LOW (ref 4.00–5.20)
RED CELL DISTRIBUTION WIDTH: 14.9 % (ref 12.0–15.0)
WBC ADJUSTED: 5.7 10*9/L — ABNORMAL LOW (ref 4.5–11.0)

## 2019-01-16 NOTE — Unmapped (Signed)
[facility] Refill Coordination Note    Specialty Medication(s) to be Shipped:   Transplant: Myfortic 180mg , Prograf 1mg  and valgancyclovir 450mg   Other medication(s) to be shipped: Aspirin 81mg , Calcitriol 0.67mcg, Midodrine 5mg , Omeprazole 40mg  &  Sulfa-Trim 400-80mg      Kimberly [name], DOB: Mar 06, 1981  Phone: [phone] (home)     All above HIPAA information was verified with patient.     Completed refill call assessment today to schedule patient's medication shipment from the [facility] [phone]).       Specialty medication(s) and dose(s) confirmed: Regimen is correct and unchanged.   Changes to medications: Keora reports no changes reported at this time.  Changes to insurance: No  Questions for the pharmacist: No    Confirmed patient received Welcome Packet with first shipment. The patient will receive a drug information handout for each medication shipped and additional FDA Medication Guides as required.       DISEASE/MEDICATION-SPECIFIC INFORMATION        N/A    SPECIALTY MEDICATION ADHERENCE     Medication Adherence    Patient reported X missed doses in the last month:  0  Specialty Medication:  Myfortic 180mg   Patient is on additional specialty medications:  Yes  Additional Specialty Medications:  Prograf 1mg    Patient Reported Additional Medication X Missed Doses in the Last Month:  0  Patient is on more than two specialty medications:  Yes  Specialty Medication:  Valganciclovir 450mg   Patient Reported Additional Medication X Missed Doses in the Last Month:  0  Any gaps in refill history greater than 2 weeks in the last 3 months:  no        Myfortic 180 mg: 10 days of medicine on hand   Porgraf 1 mg: 10 days of medicine on hand   Valganciclovir 450 mg: 10 days of medicine on hand     SHIPPING     Shipping address confirmed in [employer].     Delivery Scheduled: Yes, Expected medication delivery date: 01/25/2019.     Medication will be delivered via UPS to the home address in [employer] [place].    Nanna Ertle P Allena Katz   [facility] Specialty Technician

## 2019-01-18 ENCOUNTER — Ambulatory Visit: Admit: 2019-01-18 | Discharge: 2019-01-19 | Payer: MEDICARE

## 2019-01-18 DIAGNOSIS — Z940 Kidney transplant status: Principal | ICD-10-CM

## 2019-01-18 LAB — COMPREHENSIVE METABOLIC PANEL
ALBUMIN: 4.2 g/dL (ref 3.5–5.0)
ALT (SGPT): 21 U/L (ref <35)
ANION GAP: 16 mmol/L — ABNORMAL HIGH (ref 7–15)
AST (SGOT): 19 U/L (ref 14–38)
BLOOD UREA NITROGEN: 34 mg/dL — ABNORMAL HIGH (ref 7–21)
BUN / CREAT RATIO: 6
CALCIUM: 9.9 mg/dL (ref 8.5–10.2)
CHLORIDE: 94 mmol/L — ABNORMAL LOW (ref 98–107)
CO2: 29 mmol/L (ref 22.0–30.0)
CREATININE: 6.09 mg/dL — ABNORMAL HIGH (ref 0.60–1.00)
EGFR CKD-EPI AA FEMALE: 9 mL/min/{1.73_m2} — ABNORMAL LOW (ref >=60)
EGFR CKD-EPI NON-AA FEMALE: 8 mL/min/{1.73_m2} — ABNORMAL LOW (ref >=60)
EGFR CKD-EPI NON-AA FEMALE: 8 mL/min/1.73m2 — ABNORMAL LOW (ref >=60–38)
GLUCOSE RANDOM: 102 mg/dL (ref 65–179)
POTASSIUM: 3.9 mmol/L (ref 3.5–5.0)
PROTEIN TOTAL: 6.8 g/dL (ref 6.5–8.3)
SODIUM: 139 mmol/L (ref 135–145)

## 2019-01-18 LAB — CBC W/ AUTO DIFF
BASOPHILS ABSOLUTE COUNT: 0.1 10*9/L (ref 0.0–0.1)
BASOPHILS RELATIVE PERCENT: 1.4 %
EOSINOPHILS RELATIVE PERCENT: 1.5 %
HEMATOCRIT: 26.6 % — ABNORMAL LOW (ref 36.0–46.0)
HEMOGLOBIN: 9 g/dL — ABNORMAL LOW (ref 13.5–16.0)
LARGE UNSTAINED CELLS: 1 % (ref 0–4)
LYMPHOCYTES RELATIVE PERCENT: 1.3 %
MEAN CORPUSCULAR HEMOGLOBIN CONC: 33.8 g/dL (ref 31.0–37.0)
MEAN CORPUSCULAR HEMOGLOBIN: 31.6 pg (ref 26.0–34.0)
MEAN CORPUSCULAR VOLUME: 93.4 fL (ref 80.0–100.0)
MEAN PLATELET VOLUME: 6.8 fL — ABNORMAL LOW (ref 7.0–10.0)
MONOCYTES ABSOLUTE COUNT: 0.1 10*9/L — ABNORMAL LOW (ref 0.2–0.8)
MONOCYTES RELATIVE PERCENT: 2.5 %
NEUTROPHILS ABSOLUTE COUNT: 4.3 10*9/L (ref 2.0–7.5)
NEUTROPHILS RELATIVE PERCENT: 92.4 %
PLATELET COUNT: 453 10*9/L — ABNORMAL HIGH (ref 150–440)
RED BLOOD CELL COUNT: 2.85 10*12/L — ABNORMAL LOW (ref 4.00–5.20)
RED CELL DISTRIBUTION WIDTH: 14.8 % (ref 12.0–15.0)
WBC ADJUSTED: 4.6 10*9/L (ref 4.5–11.0)

## 2019-01-18 LAB — TACROLIMUS LEVEL: TACROLIMUS BLOOD: 10.1 ng/mL

## 2019-01-21 ENCOUNTER — Ambulatory Visit: Admit: 2019-01-21 | Discharge: 2019-01-22 | Payer: MEDICARE

## 2019-01-21 DIAGNOSIS — Z940 Kidney transplant status: Principal | ICD-10-CM

## 2019-01-21 LAB — CBC W/ AUTO DIFF
BASOPHILS ABSOLUTE COUNT: 0.1 10*9/L (ref 0.0–0.1)
BASOPHILS RELATIVE PERCENT: 1.4 %
EOSINOPHILS ABSOLUTE COUNT: 0.1 10*9/L (ref 0.0–0.4)
EOSINOPHILS RELATIVE PERCENT: 1 %
HEMATOCRIT: 27.9 % — ABNORMAL LOW (ref 36.0–46.0)
HEMOGLOBIN: 9.3 g/dL — ABNORMAL LOW (ref 13.5–16.0)
LYMPHOCYTES ABSOLUTE COUNT: 0.1 10*9/L — ABNORMAL LOW (ref 1.5–5.0)
LYMPHOCYTES RELATIVE PERCENT: 1.4 %
MEAN CORPUSCULAR HEMOGLOBIN CONC: 33.3 g/dL (ref 31.0–37.0)
MEAN CORPUSCULAR VOLUME: 93.1 fL (ref 80.0–100.0)
MEAN CORPUSCULAR VOLUME: 93.1 fL — ABNORMAL LOW (ref 80.0–100.0)
MONOCYTES ABSOLUTE COUNT: 0.2 10*9/L (ref 0.2–0.8)
MONOCYTES RELATIVE PERCENT: 3.2 %
NEUTROPHILS ABSOLUTE COUNT: 4.2 10*9/L (ref 2.0–7.5)
NEUTROPHILS RELATIVE PERCENT: 92.1 %
PLATELET COUNT: 489 10*9/L — ABNORMAL HIGH (ref 150–440)
RED BLOOD CELL COUNT: 3 10*12/L — ABNORMAL LOW (ref 4.00–5.20)
RED CELL DISTRIBUTION WIDTH: 14.9 % (ref 12.0–15.0)
WBC ADJUSTED: 4.6 10*9/L (ref 4.5–11.0)

## 2019-01-21 LAB — MAGNESIUM: MAGNESIUM: 1.7 mg/dL (ref 1.6–2.2)

## 2019-01-21 NOTE — Unmapped (Signed)
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  2nd kidney transplant - 2000-2002 - DD failed chronic rejection.  3rd kidney transplant - 2004 - 2007 - DD failed in 2007 chronic rejection    - Post-transplant course complicated by delayed graft function, currently on hemodialysis.  Change dialysis to 3 times/week and increase EDW to 74 kg.    - Renal biopsy on 12/24/18: due to DGF  Mild acute tubular injury    Creatinine - value 7.70  Elevated.  Urine analysis: protein 100 mg/dl / WBC >161 / RBC <1  Urine protein/creatinine ratio: 0.746 (12/21/18)  DSA: negative - 12/24/18    Last CMV checked: not detected - 12/17/18  Last BKV checked: not checked    Prophylaxis -  Bactrim 80 mg TIW (until 06/05/19)  Valcyte 450 mg BIW (M/Th) (until 03/05/19)    2) Immunosuppression: Prograf 3 mg BID / Myfortic 540 mg BID  - Prograf level 8.7  and Last dose of Prograf: 9:00 p.m  - Goal of 12 hour Prograf trough: 7-9  - Changes in Immunosuppression - Decrease prograf to 3 &2 and add prednisone 5 mg.   - Medications side effects: No    3) Hypotension - Controlled - on midodrine 15 mg TID  Goal for B.P > 100 and <120  Changes in B.P medications - No    4) Anemia - stable - Hgb 8.9   Goal for Hemoglobin > 11.0  Ferritin pending    5) MBD - Calcium 10.2 / Phosphorus 4.5 - Secondary hyperparathyroidism  On calcitriol 0.25 mcg daily  iPTH - 73.8 (12/05/18)  Last Dexa scan     6) Electrolytes: WNL    7) Immunizations:  Influenza (inactivated only): 08/09/18  Pneumococcal vaccination (inactivated only):     8) Cancer screening:  PAP smear - 3/15 negative   Mammogram - Age 56.    Follow up: 4 weeks.    History of Presenting Illness:     Patient's post transplant course has been complicated by delayed graft function, currently on hemodialysis. She was last seen by Dr. Burna Mortimer on 12/21/18. She presents today for a clinic follow up visit.    Currently on dialysis 5 times/week for 3 hours. EDW before transplant was 72.5kg, it was increased to 73.5 kg last week. U.O continues to be poor - 100-125 ml.day.  Overall feels well, denies any problems. No chest pain/tightness/SOB.  No fever/chills.     Last dose of Prograf at 9 p.m.   Diabetes: No   HTN: Yes: Hypotension    Controlled:Yes   Adherence      With Medication: yes    With Follow up: yes    Functional Status: independent    Patient was found to have reflux nephropathy at age 31 and was on peritoneal dialysis briefly before receiving a living related renal transplant from mother. She had some thrombotic complication and underwent nephrectomy in 1997. She started dialysis and had a second kidney transplant at age 10 in 46. 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 She received a 3rd kidney transplant  in 11/2002 which failed in 2007 due to chronic rejection.    She has decreased vision both eyes.    Review of Systems:     Fever or chills: negative   Sore throat: negative   Fatigue/malaise: negative   Weight loss or gain: negative   New skin rash/lump or bump: negative   Problems with teeth/gums: negative   Chest pain: negative   Cough or shortness of breath: negative   Swelling: negative   Belly pain/heartburn/nausea/vomiting or diarrhea: negative   Pain or bleeding when urinating: negative   Twitching/numbness or weakness: negative     Physical Exam:     BP 108/74 (BP Site: R Arm, BP Position: Sitting, BP Cuff Size: Medium)  - Pulse 80  - Temp 36.1 ??C (96.9 ??F)  - Wt 74.7 kg (164 lb 9.6 oz)  - BMI 27.82 kg/m??     Nursing note and vitals reviewed.   Constitutional: Oriented to person, place, and time. Appears well-developed and well-nourished. No distress. HENT: nares without abnormalities, oropharynx clear, moist mucous membranes  Head: Normocephalic and atraumatic.   Eyes: Right eye exhibits no discharge. Left eye exhibits no discharge. No scleral icterus.   Neck: Normal range of motion. Neck supple.   Cardiovascular: Normal rate and regular rhythm. Exam reveals no gallop and no friction rub. No murmur heard.   Pulmonary/Chest: Effort normal and breath sounds normal. No respiratory distress.   Abdominal: Soft. Non tender  Musculoskeletal: Normal range of motion. No edema and no tenderness.   Neurological: Alert and oriented to person, place, and time.   Skin: Skin is warm and dry. No rash noted. Not diaphoretic. No erythema. No pallor.   Psychiatric: Normal mood and affect. Behavior is normal. Judgment and thought content normal.     Renal Transplant History:    Race:  Caucasian   Age of recipient (at time of transplant): 38 y.o.   Cause of kidney disease: Reflux nephropathy   Native biopsy: No    Date of transplant: 12/05/18   Type of transplant: KDPI - 39%    - Donor creatinine: 1.3    - Any co morbidities: No    - Infection in donor: No    - HLA Mismatch: N/A    - Ischemia time: 6h 61m    - Crossmatch: negative    - Donor kidney biopsy: No diagnostic abnormalities identified (12/05/18)     Induction: [place] - alemtuzumab   Maintenance IS at the time of transplant: tacrolimus, mycophenolate   DGF: Yes   Diabetes Onset after transplant: No    Allergies:   Allergies   Allergen Reactions   ??? Ancef [Cefazolin] Shortness Of Breath   ??? Adhesive Tape-Silicones      Other reaction(s): Other (See Comments)  Uncoded Allergy. Allergen: plastic tape, Other Reaction: blistering   ??? Demerol [Meperidine] Nausea And Vomiting        Current Medications:   Current Outpatient Medications   Medication Sig Dispense Refill   ??? acetaminophen (TYLENOL) 500 MG tablet Take 2 tablets (1,000 mg total) by mouth every six (6) hours as needed for pain. 100 tablet 0   ??? aspirin (ECOTRIN) 81 MG tablet Take 1 tablet (81 mg total) by mouth daily. 30 tablet 11   ??? calcitrioL (ROCALTROL) 0.25 MCG capsule Take 1 capsule (0.25 mcg total) by mouth daily. 30 capsule 11   ??? carboxymethylcellulose sodium (REFRESH CELLUVISC) 1 % DpGe Instill drops in affected eye(s) as directed as needed.     ???  midodrine (PROAMATINE) 5 MG tablet Take 3 tablets (15 mg total) by mouth Three (3) times a day. 270 tablet 11   ??? MYFORTIC 180 mg EC tablet Take 3 tablets (540 mg total) by mouth Two (2) times a day. 180 tablet 11   ??? omeprazole (PRILOSEC) 40 MG capsule Take 1 capsule (40 mg total) by mouth daily. 30 capsule 11   ??? sulfamethoxazole-trimethoprim (BACTRIM) 400-80 mg per tablet Take 1 tablet (80 mg of trimethoprim total) by mouth 3 (three) times a week. 12 tablet 5   ??? valGANciclovir (VALCYTE) 450 mg tablet Take 1 tablet by mouth on Mondays and Thursdays 8 tablet 2   ??? predniSONE (DELTASONE) 5 MG tablet Take 1 tablet (5 mg total) by mouth daily. 30 tablet 11   ??? PROGRAF 1 mg capsule Take 3 capsules (3 mg total) by mouth in AM AND 2 capsules (2 mg total) nightly. 150 capsule 11     No current facility-administered medications for this visit.        Past Medical History:   Past Medical History:   Diagnosis Date   ??? Bell's palsy    ??? Disease of thyroid gland    ??? ESRD (end stage renal disease) (CMS-HCC)    ??? Nonarteritic ischemic optic neuropathy    ??? Reflux nephropathy         Laboratory studies:     Recent Results (from the past 170 hour(s))   Magnesium Level    Collection Time: 01/16/19  8:41 AM   Result Value Ref Range    Magnesium 1.7 1.6 - 2.2 mg/dL   Tacrolimus level    Collection Time: 01/16/19  8:41 AM   Result Value Ref Range    Tacrolimus, Timed 9.2 ng/mL   Comprehensive Metabolic Panel    Collection Time: 01/16/19  8:41 AM   Result Value Ref Range    Sodium 138 135 - 145 mmol/L    Potassium 4.1 3.5 - 5.0 mmol/L    Chloride 92 (L) 98 - 107 mmol/L    Anion Gap 15 7 - 15 mmol/L    CO2 31.0 (H) 22.0 - 30.0 mmol/L    BUN 34 (H) 7 - 21 mg/dL    Creatinine 1.61 (H) 0.60 - 1.00 mg/dL    BUN/Creatinine Ratio 5     EGFR CKD-EPI Non-African American, Female 8 (L) >=60 mL/min/1.25m2    EGFR CKD-EPI African American, Female 9 (L) >=60 mL/min/1.69m2    Glucose 106 65 - 179 mg/dL    Calcium 9.8 8.5 - 09.6 mg/dL    Albumin 4.4 3.5 - 5.0 g/dL    Total Protein 7.0 6.5 - 8.3 g/dL    Total Bilirubin 0.4 0.0 - 1.2 mg/dL    AST 23 14 - 38 U/L    ALT 26 <35 U/L    Alkaline Phosphatase 79 38 - 126 U/L   CBC w/ Differential    Collection Time: 01/16/19  8:41 AM   Result Value Ref Range    WBC 5.7 4.5 - 11.0 10*9/L    RBC 2.97 (L) 4.00 - 5.20 10*12/L    HGB 9.4 (L) 13.5 - 16.0 g/dL    HCT 04.5 (L) 40.9 - 46.0 %    MCV 93.2 80.0 - 100.0 fL    MCH 31.5 26.0 - 34.0 pg    MCHC 33.8 31.0 - 37.0 g/dL    RDW 81.1 91.4 - 78.2 %    MPV 6.8 (L) 7.0 - 10.0 fL  Platelet 471 (H) 150 - 440 10*9/L    Neutrophils % 94.1 %    Lymphocytes % 1.1 %    Monocytes % 2.2 %    Eosinophils % 0.8 %    Basophils % 1.1 %    Absolute Neutrophils 5.3 2.0 - 7.5 10*9/L    Absolute Lymphocytes 0.1 (L) 1.5 - 5.0 10*9/L    Absolute Monocytes 0.1 (L) 0.2 - 0.8 10*9/L    Absolute Eosinophils 0.0 0.0 - 0.4 10*9/L    Absolute Basophils 0.1 0.0 - 0.1 10*9/L    Large Unstained Cells 1 0 - 4 %   Magnesium Level    Collection Time: 01/18/19  8:29 AM   Result Value Ref Range    Magnesium 1.8 1.6 - 2.2 mg/dL   Tacrolimus level    Collection Time: 01/18/19  8:29 AM   Result Value Ref Range    Tacrolimus, Timed 10.1 ng/mL   Comprehensive Metabolic Panel    Collection Time: 01/18/19  8:29 AM   Result Value Ref Range    Sodium 139 135 - 145 mmol/L    Potassium 3.9 3.5 - 5.0 mmol/L    Chloride 94 (L) 98 - 107 mmol/L    Anion Gap 16 (H) 7 - 15 mmol/L    CO2 29.0 22.0 - 30.0 mmol/L    BUN 34 (H) 7 - 21 mg/dL    Creatinine 9.81 (H) 0.60 - 1.00 mg/dL    BUN/Creatinine Ratio 6     EGFR CKD-EPI Non-African American, Female 8 (L) >=60 mL/min/1.68m2    EGFR CKD-EPI African American, Female 9 (L) >=60 mL/min/1.25m2    Glucose 102 65 - 179 mg/dL    Calcium 9.9 8.5 - 19.1 mg/dL    Albumin 4.2 3.5 - 5.0 g/dL    Total Protein 6.8 6.5 - 8.3 g/dL    Total Bilirubin 0.3 0.0 - 1.2 mg/dL    AST 19 14 - 38 U/L    ALT 21 <35 U/L    Alkaline Phosphatase 79 38 - 126 U/L   CBC w/ Differential    Collection Time: 01/18/19  8:29 AM   Result Value Ref Range    WBC 4.6 4.5 - 11.0 10*9/L    RBC 2.85 (L) 4.00 - 5.20 10*12/L    HGB 9.0 (L) 13.5 - 16.0 g/dL    HCT 47.8 (L) 29.5 - 46.0 %    MCV 93.4 80.0 - 100.0 fL    MCH 31.6 26.0 - 34.0 pg    MCHC 33.8 31.0 - 37.0 g/dL    RDW 62.1 30.8 - 65.7 %    MPV 6.8 (L) 7.0 - 10.0 fL    Platelet 453 (H) 150 - 440 10*9/L    Neutrophils % 92.4 %    Lymphocytes % 1.3 %    Monocytes % 2.5 %    Eosinophils % 1.5 %    Basophils % 1.4 %    Absolute Neutrophils 4.3 2.0 - 7.5 10*9/L    Absolute Lymphocytes 0.1 (L) 1.5 - 5.0 10*9/L    Absolute Monocytes 0.1 (L) 0.2 - 0.8 10*9/L    Absolute Eosinophils 0.1 0.0 - 0.4 10*9/L    Absolute Basophils 0.1 0.0 - 0.1 10*9/L    Large Unstained Cells 1 0 - 4 %   Magnesium Level    Collection Time: 01/21/19  8:41 AM   Result Value Ref Range    Magnesium 1.7 1.6 - 2.2 mg/dL   Tacrolimus level    Collection  Time: 01/21/19  8:41 AM   Result Value Ref Range    Tacrolimus, Timed 7.7 ng/mL   CBC w/ Differential    Collection Time: 01/21/19  8:41 AM   Result Value Ref Range    WBC 4.6 4.5 - 11.0 10*9/L    RBC 3.00 (L) 4.00 - 5.20 10*12/L    HGB 9.3 (L) 13.5 - 16.0 g/dL    HCT 16.1 (L) 09.6 - 46.0 %    MCV 93.1 80.0 - 100.0 fL    MCH 31.0 26.0 - 34.0 pg    MCHC 33.3 31.0 - 37.0 g/dL    RDW 04.5 40.9 - 81.1 %    MPV 6.8 (L) 7.0 - 10.0 fL    Platelet 489 (H) 150 - 440 10*9/L    Neutrophils % 92.1 %    Lymphocytes % 1.4 %    Monocytes % 3.2 %    Eosinophils % 1.0 %    Basophils % 1.4 %    Absolute Neutrophils 4.2 2.0 - 7.5 10*9/L    Absolute Lymphocytes 0.1 (L) 1.5 - 5.0 10*9/L    Absolute Monocytes 0.2 0.2 - 0.8 10*9/L    Absolute Eosinophils 0.1 0.0 - 0.4 10*9/L    Absolute Basophils 0.1 0.0 - 0.1 10*9/L    Large Unstained Cells 1 0 - 4 %   Tacrolimus level    Collection Time: 01/22/19  8:49 AM   Result Value Ref Range    Tacrolimus, Timed 8.7 ng/mL   Comprehensive Metabolic Panel    Collection Time: 01/22/19  8:51 AM   Result Value Ref Range    Sodium 137 135 - 145 mmol/L    Potassium 4.3 3.5 - 5.0 mmol/L    Chloride 94 (L) 98 - 107 mmol/L    Anion Gap 13 7 - 15 mmol/L    CO2 30.0 22.0 - 30.0 mmol/L    BUN 36 (H) 7 - 21 mg/dL    Creatinine 9.14 (H) 0.60 - 1.00 mg/dL    BUN/Creatinine Ratio 5     EGFR CKD-EPI Non-African American, Female 6 (L) >=60 mL/min/1.8m2    EGFR CKD-EPI African American, Female 7 (L) >=60 mL/min/1.32m2    Glucose 100 70 - 179 mg/dL    Calcium 78.2 8.5 - 95.6 mg/dL    Albumin 4.2 3.5 - 5.0 g/dL    Total Protein 6.8 6.5 - 8.3 g/dL    Total Bilirubin 0.3 0.0 - 1.2 mg/dL    AST 17 14 - 38 U/L    ALT 20 <35 U/L    Alkaline Phosphatase 65 38 - 126 U/L   Magnesium Level    Collection Time: 01/22/19  8:51 AM   Result Value Ref Range    Magnesium 1.6 1.6 - 2.2 mg/dL   CBC w/ Differential    Collection Time: 01/22/19  8:51 AM   Result Value Ref Range    WBC 3.7 (L) 4.5 - 11.0 10*9/L    RBC 2.96 (L) 4.00 - 5.20 10*12/L    HGB 8.9 (L) 12.0 - 16.0 g/dL    HCT 21.3 (L) 08.6 - 46.0 %    MCV 93.9 80.0 - 100.0 fL    MCH 30.1 26.0 - 34.0 pg    MCHC 32.1 31.0 - 37.0 g/dL    RDW 57.8 46.9 - 62.9 %    MPV 7.6 7.0 - 10.0 fL    Platelet 547 (H) 150 - 440 10*9/L    Neutrophils % 91.2 %  Lymphocytes % 0.9 %    Monocytes % 2.3 %    Eosinophils % 1.8 %    Basophils % 2.3 %    Neutrophil Left Shift 1+ (A) Not Present    Absolute Neutrophils 3.4 2.0 - 7.5 10*9/L    Absolute Lymphocytes 0.0 (L) 1.5 - 5.0 10*9/L    Absolute Monocytes 0.1 (L) 0.2 - 0.8 10*9/L    Absolute Eosinophils 0.1 0.0 - 0.4 10*9/L    Absolute Basophils 0.1 0.0 - 0.1 10*9/L    Large Unstained Cells 2 0 - 4 %     Mireille Lacombe, MD

## 2019-01-21 NOTE — Unmapped (Addendum)
[facility] TRANSPLANT TELEPHONE PHARMACY NOTE  01/21/2019   Kimberly [name]  [id number]    Medication changes today:   1. None    Education/Adherence tools provided today:  1.provided additional pill box education  2. provided updated medication list  3.  provided additional education on immunosuppression and transplant related medications including reviewing indications of medications, dosing and side effects    Follow up items:  1. goal of understanding indications and dosing of immunosuppression medications  2. Monitor SCr for improvement and if need to renally adjust Valcyte  3. BP and if can come off of midodrine  4. Repeat LFTs    Next visit with pharmacy in 1 month  ____________________________________________________________________    Kimberly [name] is a 38 y.o. female s/p deceased kidney transplant on 12-13-18 (Kidney) 2/2 reflux nephropathy.  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  First kidney clotted off and 2ND rejected.    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    Called by pharmacy today for: medication management and adherence education; last seen by pharmacy 1 months ago     CC:  Patient has no complaints today     There were no vitals filed for this visit.    Allergies   Allergen Reactions   ??? Ancef [Cefazolin] Shortness Of Breath   ??? Adhesive Tape-Silicones      Other reaction(s): Other (See Comments)  Uncoded Allergy. Allergen: plastic tape, Other Reaction: blistering   ??? Demerol [Meperidine] Nausea And Vomiting       All medications reviewed and updated.     Medication list includes revisions made during today???s encounter    Outpatient Encounter Medications as of 01/21/2019   Medication Sig Dispense Refill   ??? acetaminophen (TYLENOL) 500 MG tablet Take 2 tablets (1,000 mg total) by mouth every six (6) hours as needed for pain. 100 tablet 0   ??? aspirin (ECOTRIN) 81 MG tablet Take 1 tablet (81 mg total) by mouth daily. 30 tablet 11   ??? calcitrioL (ROCALTROL) 0.25 MCG capsule Take 1 capsule (0.25 mcg total) by mouth daily. 30 capsule 11   ??? carboxymethylcellulose sodium (REFRESH CELLUVISC) 1 % DpGe Instill drops in affected eye(s) as directed as needed.     ??? hypromellose (GONAK) 2.5 % ophthalmic solution Apply to eye continuous as needed.     ??? magnesium oxide-Mg AA chelate (MAGNESIUM, AMINO ACID CHELATE,) 133 mg Tab Take 1 tablet by mouth Two (2) times a day. HOLD until directed to start by your coordinator. (Patient not taking: Reported on 01/10/2019) 100 tablet 11   ??? midodrine (PROAMATINE) 5 MG tablet Take 3 tablets (15 mg total) by mouth Three (3) times a day. 270 tablet 11   ??? MYFORTIC 180 mg EC tablet Take 3 tablets (540 mg total) by mouth Two (2) times a day. 180 tablet 11   ??? omeprazole (PRILOSEC) 40 MG capsule Take 1 capsule (40 mg total) by mouth daily. 30 capsule 11   ??? PROGRAF 1 mg capsule Take 3 capsules (3 mg total) by mouth daily AND 3 capsules (3 mg total) nightly. 150 capsule 11   ??? sulfamethoxazole-trimethoprim (BACTRIM) 400-80 mg per tablet Take 1 tablet (80 mg of trimethoprim total) by mouth 3 (three) times a week. 12 tablet 5   ??? valGANciclovir (VALCYTE) 450 mg tablet Take 1 tablet by mouth on Mondays and Thursdays 8 tablet 2  No facility-administered encounter medications on file as of 01/21/2019.        Induction agent : alemtuzumab    CURRENT IMMUNOSUPPRESSION: tacrolimus 3 mg PO BID  prograf/cyclosporine goal: 8-10   [id number]  mg PO bid    steroid free     Patient is tolerating immunosuppression well    IMMUNOSUPPRESSION DRUG LEVELS:  Lab Results   Component Value Date    Tacrolimus, Trough 6.6 12/24/2018    Tacrolimus, Trough 6.5 12/21/2018    Tacrolimus, Trough 6.5 12/17/2018    Tacrolimus, Timed 7.7 01/21/2019    Tacrolimus, Timed 10.1 01/18/2019    Tacrolimus, Timed 9.2 01/16/2019     Lab Results   Component Value Date    Cyclosporine, Trough <10 (L) 12/17/2018     No results found for: EVEROLIMUS  No results found for: SIROLIMUS    Prograf level is accurate 12 hour trough    Graft function: complicated by DGF  DSA: ntd  Biopsies to date: 3/2 no evidence of rejection. Some of the injured tubules show signs that fit into spectrum of structural CNI toxic changes  WBC/ANC:  wnl    Plan: Will maintain current immunosuppression. Continue to monitor.    ID prophylaxis:   CMV Status: D+/ R+, moderate risk . CMV prophylaxis: valganciclovir 450 mg two days per week (Mon/Thurs) x 3 months per protocol (end 03/05/19)  No results found for: CMVCP  PCP Prophylaxis: bactrim SS 1 tab MWF x 6 months (end 06/05/19)  Thrush: completed in hospital  Patient is  tolerating infectious prophylaxis well    Plan: Continue per protocol. Continue to monitor.    CV Prophylaxis: asa 81 mg   The ASCVD Risk score Denman George DC Montez Hageman, et al., 2013) failed to calculate.  Statin therapy: Not indicated; currently on no statin  Plan: consider starting statin at a later date. Continue to monitor     BP: Goal < 140/90. Clinic vitals reported above  Home BP ranges:  100s-115s/60s  Current meds include: midodrine 15 mg TID  Plan: within goal.  Hopefully BP will improve as kidney function improves. Continue to monitor    Anemia:  H/H:   Lab Results   Component Value Date    HGB 9.3 (L) 01/21/2019     Lab Results   Component Value Date    HCT 27.9 (L) 01/21/2019     Iron panel:  No results found for: IRON, TIBC, FERRITIN  No results found for: LABIRON    Prior ESA use: none post transplant    Plan: out of goal but stable.  Continue to monitor.     DM:   Lab Results   Component Value Date    A1C 5.1 12/05/2018   . Goal A1c < 7  History of Dm? No  Diet: did not address  Exercise:not yet  Fluid intake: 1 L daily  Plan:  Continue to monitor.  As SCr improves will need to increase fluid intake    Electrolytes: wnl  Meds currently on: none  Plan: Continue to monitor     GI/BM: pt reports no diarrhea; stools are soft but formed  Meds currently on: omeprazole 40 mg daily  Plan:  Continue to monitor    Pain: pt reports no pain   Meds currently on: APAP PRN  Plan: Continue to monitor    Bone health:   Vitamin D Level: 9.5 on 12/17/18. Goal > 30.   Last DEXA results:  none available  Current meds include:  calcitriol 0.25 mcg daily  Plan: Vitamin D level  out of goal,consider increasing calcitriol to 0.5 mcg daily at next visit. Continue to monitor.     Dry eye  meds currently on: hypromellose drops PRN    Women's/Men's Health:  Kelechi Astarita is a 38 y.o. Female of childbearing age. Patient reports no desire for pregnancy and does not have IUD or take OC.  She's aware of pregnancy risk with Myfortic  Plan: Encouraged use of barrier method and to consider another contraceptive option. Continue to monitor    Adherence: Patient has good understanding of medications; was able to independently identify names/doses of immunosuppressants and OI meds.  Patient  does fill their own pill box on a regular basis at home.  Patient brought medication card:yes  Pill box:did not bring  Plan: provided extensive adherence counseling/intervention    Spent approximately 15 minutes on educating this patient and greater than 50% was spent in telephone counseling regarding post transplant medication education. Questions and concerns were address to patient's satisfaction.    Patient was reviewed with Dr. Nestor Lewandowsky who was agreement with the stated plan:     During this visit, the following was completed:   BG log data assessment  BP log data assessment  Labs ordered and evaluated  complex treatment plan >1 DS   Patient education was completed for 11-24 minutes     All questions/concerns were addressed to the patient's satisfaction.  __________________________________________  Cleone Slim, PHARMD  [employer] [facility]PL[facility]  PAGER [phone]

## 2019-01-21 NOTE — Unmapped (Signed)
Dietitian Note    Called pt to remind her about her nutrition appt on 01/22/19 at 11 AM.    Pt stated this would be a good time to call.    Will continue to follow pt per protocol or when consulted.     Greta Doom, MS, RDN, CSG, LDN  [phone] pager

## 2019-01-22 ENCOUNTER — Ambulatory Visit: Admit: 2019-01-22 | Discharge: 2019-01-22 | Payer: MEDICARE | Attending: Registered" | Primary: Registered"

## 2019-01-22 ENCOUNTER — Institutional Professional Consult (permissible substitution): Admit: 2019-01-22 | Discharge: 2019-01-22 | Payer: MEDICARE

## 2019-01-22 ENCOUNTER — Ambulatory Visit: Admit: 2019-01-22 | Discharge: 2019-01-22 | Payer: MEDICARE

## 2019-01-22 ENCOUNTER — Ambulatory Visit: Admit: 2019-01-22 | Discharge: 2019-01-22 | Payer: MEDICARE | Attending: Nephrology | Primary: Nephrology

## 2019-01-22 DIAGNOSIS — Z7982 Long term (current) use of aspirin: Principal | ICD-10-CM

## 2019-01-22 DIAGNOSIS — T8619 Other complication of kidney transplant: Principal | ICD-10-CM

## 2019-01-22 DIAGNOSIS — Z940 Kidney transplant status: Principal | ICD-10-CM

## 2019-01-22 DIAGNOSIS — Z881 Allergy status to other antibiotic agents status: Principal | ICD-10-CM

## 2019-01-22 DIAGNOSIS — Z992 Dependence on renal dialysis: Principal | ICD-10-CM

## 2019-01-22 DIAGNOSIS — G510 Bell's palsy: Principal | ICD-10-CM

## 2019-01-22 DIAGNOSIS — Z87448 Personal history of other diseases of urinary system: Principal | ICD-10-CM

## 2019-01-22 DIAGNOSIS — Z79899 Other long term (current) drug therapy: Principal | ICD-10-CM

## 2019-01-22 DIAGNOSIS — D649 Anemia, unspecified: Principal | ICD-10-CM

## 2019-01-22 DIAGNOSIS — H47019 Ischemic optic neuropathy, unspecified eye: Principal | ICD-10-CM

## 2019-01-22 DIAGNOSIS — I959 Hypotension, unspecified: Principal | ICD-10-CM

## 2019-01-22 DIAGNOSIS — N2581 Secondary hyperparathyroidism of renal origin: Principal | ICD-10-CM

## 2019-01-22 DIAGNOSIS — N179 Acute kidney failure, unspecified: Principal | ICD-10-CM

## 2019-01-22 DIAGNOSIS — E079 Disorder of thyroid, unspecified: Principal | ICD-10-CM

## 2019-01-22 DIAGNOSIS — N186 End stage renal disease: Principal | ICD-10-CM

## 2019-01-22 DIAGNOSIS — Z7952 Long term (current) use of systemic steroids: Principal | ICD-10-CM

## 2019-01-22 DIAGNOSIS — D899 Disorder involving the immune mechanism, unspecified: Principal | ICD-10-CM

## 2019-01-22 DIAGNOSIS — Z885 Allergy status to narcotic agent status: Principal | ICD-10-CM

## 2019-01-22 DIAGNOSIS — N13729 Vesicoureteral-reflux with reflux nephropathy without hydroureter, unspecified: Principal | ICD-10-CM

## 2019-01-22 LAB — COMPREHENSIVE METABOLIC PANEL
ALBUMIN: 4.2 g/dL (ref 3.5–5.0)
ALKALINE PHOSPHATASE: 65 U/L (ref 38–126)
ALT (SGPT): 20 U/L (ref <35)
ANION GAP: 13 mmol/L (ref 7–15)
AST (SGOT): 17 U/L (ref 14–38)
BILIRUBIN TOTAL: 0.3 mg/dL (ref 0.0–1.2)
BLOOD UREA NITROGEN: 36 mg/dL — ABNORMAL HIGH (ref 7–21)
CALCIUM: 10.2 mg/dL (ref 8.5–10.2)
CHLORIDE: 94 mmol/L — ABNORMAL LOW (ref 98–107)
CO2: 30 mmol/L (ref 22.0–30.0)
CREATININE: 7.7 mg/dL — ABNORMAL HIGH (ref 0.60–1.00)
EGFR CKD-EPI AA FEMALE: 7 mL/min/{1.73_m2} — ABNORMAL LOW (ref >=60)
EGFR CKD-EPI NON-AA FEMALE: 6 mL/min/{1.73_m2} — ABNORMAL LOW (ref >=60)
GLUCOSE RANDOM: 100 mg/dL (ref 70–179)
POTASSIUM: 4.3 mmol/L (ref 3.5–5.0)
PROTEIN TOTAL: 6.8 g/dL (ref 6.5–8.3)
SODIUM: 137 mmol/L (ref 135–145)
SODIUM: 137 mmol/L — ABNORMAL LOW (ref 135–145)

## 2019-01-22 LAB — CBC W/ AUTO DIFF
BASOPHILS ABSOLUTE COUNT: 0.1 10*9/L (ref 0.0–0.1)
EOSINOPHILS ABSOLUTE COUNT: 0.1 10*9/L (ref 0.0–0.4)
EOSINOPHILS RELATIVE PERCENT: 1.8 %
HEMATOCRIT: 27.8 % — ABNORMAL LOW (ref 36.0–46.0)
HEMOGLOBIN: 8.9 g/dL — ABNORMAL LOW (ref 12.0–16.0)
LARGE UNSTAINED CELLS: 2 % (ref 0–4)
LYMPHOCYTES ABSOLUTE COUNT: 0 10*9/L — ABNORMAL LOW (ref 1.5–5.0)
LYMPHOCYTES RELATIVE PERCENT: 0.9 %
MEAN CORPUSCULAR HEMOGLOBIN CONC: 32.1 g/dL (ref 31.0–37.0)
MEAN CORPUSCULAR HEMOGLOBIN: 30.1 pg (ref 26.0–34.0)
MEAN CORPUSCULAR VOLUME: 93.9 fL (ref 80.0–100.0)
MEAN PLATELET VOLUME: 7.6 fL (ref 7.0–10.0)
MONOCYTES ABSOLUTE COUNT: 0.1 10*9/L — ABNORMAL LOW (ref 0.2–0.8)
MONOCYTES RELATIVE PERCENT: 2.3 %
NEUTROPHILS ABSOLUTE COUNT: 3.4 10*9/L (ref 2.0–7.5)
NEUTROPHILS RELATIVE PERCENT: 91.2 %
PLATELET COUNT: 547 10*9/L — ABNORMAL HIGH (ref 150–440)
RED BLOOD CELL COUNT: 2.96 10*12/L — ABNORMAL LOW (ref 4.00–5.20)
RED CELL DISTRIBUTION WIDTH: 14.4 % (ref 12.0–15.0)
WBC ADJUSTED: 3.7 10*9/L — ABNORMAL LOW (ref 4.5–11.0)

## 2019-01-22 MED ORDER — PROGRAF 1 MG CAPSULE
ORAL_CAPSULE | 11 refills | 0 days | Status: CP
Start: 2019-01-22 — End: 2019-03-19
  Filled 2019-01-24: qty 150, 30d supply, fill #0

## 2019-01-22 MED ORDER — PREDNISONE 5 MG TABLET
ORAL_TABLET | Freq: Every day | ORAL | 11 refills | 0 days | Status: CP
Start: 2019-01-22 — End: 2019-03-19
  Filled 2019-01-24: qty 30, 30d supply, fill #0

## 2019-01-22 NOTE — Unmapped (Signed)
[facility]  Patient Onboarding/Medication Counseling    Kimberly [name] is a 38 y.o. female with a kidney transplant who I am counseling today on initiation of therapy.  I am speaking to the patient.    Verified patient's date of birth / HIPAA.    Specialty medication(s) to be sent: Transplant: Prednisone 5mg       Non-specialty medications/supplies to be sent: NONE      Medications not needed at this time: No other medications needed at this time.       The patient declined counseling on missed dose instructions, goals of therapy, side effects and monitoring parameters, warnings and precautions, drug/food interactions and storage, handling precautions, and disposal because they were counseled in clinic. The information in the declined sections below are for informational purposes only and was not discussed with patient.     Education Provided: ?    Dose/Administration discussed: Prednisone 5mg -Take one tablet daily. This medication should be taken  with food.  Stressed the importance of taking medication as prescribed and to contact provider if that changes at any time.  Discussed missed dose instructions.    Storage requirements: this medicine should be stored at room temperature.     Side effects / precautions discussed: The patient stated she has taken prednisone before and declined patient education today.  Patient will receive a drug information handout with shipment.    Handling precautions / disposal reviewed:   The patient stated she has taken prednisone before and declined patient education today.    Drug Interactions: other medications reviewed and up to date in Epic.  No drug interactions identified.    Comorbidities/Allergies: reviewed and up to date in Epic.    Verified therapy is appropriate and should continue            Current Medications (including OTC/herbals), Comorbidities and Allergies     Current Outpatient Medications   Medication Sig Dispense Refill   ??? acetaminophen (TYLENOL) 500 MG tablet Take 2 tablets (1,000 mg total) by mouth every six (6) hours as needed for pain. 100 tablet 0   ??? aspirin (ECOTRIN) 81 MG tablet Take 1 tablet (81 mg total) by mouth daily. 30 tablet 11   ??? calcitrioL (ROCALTROL) 0.25 MCG capsule Take 1 capsule (0.25 mcg total) by mouth daily. 30 capsule 11   ??? carboxymethylcellulose sodium (REFRESH CELLUVISC) 1 % DpGe Instill drops in affected eye(s) as directed as needed.     ??? midodrine (PROAMATINE) 5 MG tablet Take 3 tablets (15 mg total) by mouth Three (3) times a day. 270 tablet 11   ??? MYFORTIC 180 mg EC tablet Take 3 tablets (540 mg total) by mouth Two (2) times a day. 180 tablet 11   ??? omeprazole (PRILOSEC) 40 MG capsule Take 1 capsule (40 mg total) by mouth daily. 30 capsule 11   ??? predniSONE (DELTASONE) 5 MG tablet Take 1 tablet (5 mg total) by mouth daily. 30 tablet 11   ??? PROGRAF 1 mg capsule Take 3 capsules (3 mg total) by mouth in AM AND 2 capsules (2 mg total) nightly. 150 capsule 11   ??? sulfamethoxazole-trimethoprim (BACTRIM) 400-80 mg per tablet Take 1 tablet (80 mg of trimethoprim total) by mouth 3 (three) times a week. 12 tablet 5   ??? valGANciclovir (VALCYTE) 450 mg tablet Take 1 tablet by mouth on Mondays and Thursdays 8 tablet 2     No current facility-administered medications for this visit.  Allergies   Allergen Reactions   ??? Ancef [Cefazolin] Shortness Of Breath   ??? Adhesive Tape-Silicones      Other reaction(s): Other (See Comments)  Uncoded Allergy. Allergen: plastic tape, Other Reaction: blistering   ??? Demerol [Meperidine] Nausea And Vomiting       Patient Active Problem List   Diagnosis   ??? Chronic skin ulcer (CMS-HCC)   ??? Kidney replaced by transplant   ??? Anemia   ??? Ischemic optic neuropathy of both eyes   ??? 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 Closed nondisplaced fracture of lateral malleolus of right fibula   ??? Chronic hypotension   ??? Hyperparathyroidism due to renal insufficiency (CMS-HCC)   ??? ESRD (end stage renal disease) (CMS-HCC)   ??? Bell's palsy   ??? PCT (porphyria cutanea tarda) (CMS-HCC)       Reviewed and up to date in Epic.    Appropriateness of Therapy     Is medication and dose appropriate based on diagnosis? Yes    Baseline Quality of Life Assessment      How many days over the past month did your kidney transplant keep you from your normal activities? 0    Financial Information     Medication Assistance provided: None Required    Anticipated copay of $15.00 reviewed with patient. Verified delivery address.    Delivery Information     Scheduled delivery date: 01/25/2019    Expected start date: 01/25/2019    Medication will be delivered via UPS to the home address in [facility].  This shipment will not require a signature.      Explained the services we provide at [facility] and that each month we would call to set up refills.  Stressed importance of returning phone calls so that we could ensure they receive their medications in time each month.  Informed patient that we should be setting up refills 7-10 days prior to when they will run out of medication.  A pharmacist will reach out to perform a clinical assessment periodically.  Informed patient that a welcome packet and a drug information handout will be sent.      Patient verbalized understanding of the above information as well as how to contact the pharmacy at [phone] option 4 with any questions/concerns.  The pharmacy is open Monday through Friday 8:30am-4:30pm.  A pharmacist is available 24/7 via pager to answer any clinical questions they may have.    Patient Specific Needs     ? Does the patient have any physical, cognitive, or cultural barriers? No    ? Patient prefers to have medications discussed with  Patient     ? Is the patient able to read and understand education materials at a high school level or above? Yes    ? Patient's primary language is  English     ? Is the patient high risk? Yes, patient taking a REMS drug ? Does the patient require a Care Management Plan? No     ? Does the patient require physician intervention or other additional services (i.e. nutrition, smoking cessation, social work)? No      Tera Helper  [facility] Specialty Pharmacist

## 2019-01-22 NOTE — Unmapped (Signed)
Outpatient Nutrition - Post Renal Transplant Follow-up     Referring MD or Clinic: None Per Patient Referr*     Reason for Visit: Post Renal Transplant Evaluation Follow up    Prior to this phone visit, chart was reviewed and this visit is appropriate at this time d/t COVID-19 precautions.    Pt is aware of co-pay may be involved with this visit.      Renal Transplant Date: 12/06/2018    NUTRITION ASSESSMENT     New developments since RD appointment on 12/06/2018  Dialysis 3 times weekly, trying to wake up this kidney  Following safety guidelines by washing fresh F/V, washing hands, cleaning counters  Drinking about 1 to 1.5 liters of fluids per [place] orders by pt report    Nutrition goals from RD visit on 12/06/18  Monitor NPO status, medical nutrition therapy advancement and tolerance-met  Will provide nutrition education post transplant food safety precautions-met   ??  Anthropometrics  Height:  163.8 cm (5' 4.49)   Weight: 74.7 kg (164 lb 9.6 oz)    Body mass index is 27.83 kg/m??.    Weight History:     Wt Readings from Last 3 Encounters:   01/22/19 74.7 kg (164 lb 9.6 oz)   01/22/19 74.7 kg (164 lb 9.6 oz)   01/10/19 72.9 kg (160 lb 11.5 oz)     12/06/18 86 kg (189 lb 9.5 oz)   05/14/18 76 kg (167 lb 8 oz)   03/27/18 76 kg (167 lb 8.8 oz)   06/12/17 77.8 kg (171 lb 8 oz)   02/22/17 73.9 kg (163 lb)   01/22/17 74 kg (163 lb 2.3 oz)   03/14/16 76.8 kg (169 lb 4.8 oz)   02/23/15 73.8 kg (162 lb 9.6 oz)   06/06/13 71.1 kg (156 lb 12.8 oz)   06/06/13 71.1 kg (156 lb 12.8 oz)        Weight change:  Wt loss of 25 lbs during past 5 weeks    Ideal body weight: 55.6 kg  Usual body weight: 73.5 lbs       Malnutrition Assessment using AND/ASPEN Clinical Characteristics:  Unable to complete Malnutrition Assessment at this time due to (comment) (01/22/19 1105)                      COVID-19 precautions do not allow this to be preformed at this time  Relevant Medications, Herbs, Supplements include:   Reviewed nutritionally relevant medications and supplements with patient.   Current Outpatient Medications   Medication Sig Dispense Refill   ??? acetaminophen (TYLENOL) 500 MG tablet Take 2 tablets (1,000 mg total) by mouth every six (6) hours as needed for pain. 100 tablet 0   ??? aspirin (ECOTRIN) 81 MG tablet Take 1 tablet (81 mg total) by mouth daily. 30 tablet 11   ??? calcitrioL (ROCALTROL) 0.25 MCG capsule Take 1 capsule (0.25 mcg total) by mouth daily. 30 capsule 11   ??? carboxymethylcellulose sodium (REFRESH CELLUVISC) 1 % DpGe Instill drops in affected eye(s) as directed as needed.     ??? midodrine (PROAMATINE) 5 MG tablet Take 3 tablets (15 mg total) by mouth Three (3) times a day. 270 tablet 11   ??? MYFORTIC 180 mg EC tablet Take 3 tablets (540 mg total) by mouth Two (2) times a day. 180 tablet 11   ??? omeprazole (PRILOSEC) 40 MG capsule Take 1 capsule (40 mg total) by mouth daily. 30 capsule 11   ???  PROGRAF 1 mg capsule Take 3 capsules (3 mg total) by mouth daily AND 3 capsules (3 mg total) nightly. 150 capsule 11   ??? sulfamethoxazole-trimethoprim (BACTRIM) 400-80 mg per tablet Take 1 tablet (80 mg of trimethoprim total) by mouth 3 (three) times a week. 12 tablet 5   ??? valGANciclovir (VALCYTE) 450 mg tablet Take 1 tablet by mouth on Mondays and Thursdays 8 tablet 2     No current facility-administered medications for this visit.    Pt is aware of food drug interactions    Relevant Labs:   Lab Results   Component Value Date    BUN 36 (H) 01/22/2019    CREATININE 7.70 (H) 01/22/2019    GFRAA 5 (L) 06/12/2017    GFRNONAA 4 (L) 06/12/2017    NA 137 01/22/2019    K 4.3 01/22/2019    CL 94 (L) 01/22/2019    CO2 30.0 01/22/2019    CALCIUM 10.2 01/22/2019    PHOS 4.5 12/21/2018    ALBUMIN 4.2 01/22/2019    PTH 73.8 (H) 12/05/2018     No results found for: [facility]  Lab Results   Component Value Date    MG 1.6 01/22/2019    MG 1.7 01/21/2019    MG 1.8 01/18/2019    PHOS 4.5 12/21/2018    PHOS 4.7 12/17/2018    PHOS 4.9 (H) 12/14/2018     Lab Results   Component Value Date    CHOL 196 12/17/2018    HDL 47 12/17/2018    LDL 118 (H) 12/17/2018    TRIG 153 (H) 12/17/2018     Lab Results   Component Value Date    A1C 5.1 12/05/2018    A1C 4.5 (L) 06/17/2008    A1C 4.6 (L) 06/03/2008     Lab Results   Component Value Date    PROTEINUA 100 mg/dL (A) 47/82/9562    GLUCOSEU Negative 12/21/2018     Lab Results   Component Value Date    HGB 8.9 (L) 01/22/2019    HCT 27.8 (L) 01/22/2019     No results found for: IRON, TIBC, FERRITIN      Physical Activity:  Patient's physical activity level is sedentary with little to no exercise.    Playing with dog and walking some but not consistent regimen.    Dietary Restrictions, Intolerances:   No known food allergies or food intolerances.     Gastrointestinal Issues:  Heartburn treated with medication    Hunger and Satiety:   Denied issues.    Reports good appetite  Denies c/s/n/v/d at this time    Usual Intake: pt likes to cooks and eats most vegetables    Time Intake   Breakfast Cheerios with 2% milk or cereal bar (special K) or yogurt (greek and regular) or egg with burrito with cheese salsa or boiled egg with water or coffee with flavored creamer   Snack (AM) skip   Lunch Leftovers like meat like chicken with rice with sauce or pasta with broccoli corn cauliflower mixed vegetables green beans or canned greens  and  or vegetarian chili (beans, tomatoes, vegetable crumbles, tomato sauce, jalapeno chili) with baked potato and corn bread (mix) or chicken cordon bleu or couscous    Snack (PM) Skips or crackers with cheese or peanut butter or cereal bar or apple or fruit   Dinner chicken with rice with sauce or pasta with broccoli corn cauliflower mixed vegetables green beans or canned greens  and  or  vegetarian chili (beans, tomatoes, vegetable crumbles, tomato sauce, jalapeno chili) with baked potato and corn bread (mix) or chicken cordon bleu or couscous    Snack (HS) Popcorn (home popped)     Snacks:  crackers with cheese or peanut butter or cereal bar or apple or fruit  Beverages:  water  Alcohol: no  Dining Out:  0/21  Meal Schedule: Yes     Behavioral Risk Factors:  Overeating: Lacks portion control with meals and snacks.   Emotional Eating: Denied issues.   Grazing: Denied issues.   Fast Eating: Denied issues.   Nighttime Eating: None.    Estimated Daily Nutrient Needs:   Energy: 1900- 2280 kcals [25- 30 kcal/kg using last [place] weight of 76 kg]  Protein: 90- 115 gm [1.2- 1.5 g/kg using using last [place] weight of 76 kg]  Fluid: per team    NUTRITION DIAGNOSIS  her usual food intake is appropriate for compliance of post-renal transplant nutrition therapy. Dietary recall indicates her food patterns are adequate to meet estimated nutrition needs at this time.     Pt has experienced a 13.2% involuntary wt loss during past 5 weeks which is significant likely d/t fluids, poor po intake following transplant (this has resolved) and/or frequent dialysis session and/or weighing errors. Will need to continue to monitor wts.      Patient assessed to have improved food and nutrition-related knowledge deficit for post-renal transplant. She would benefit from continued nutrition education and counseling.     NUTRITION INTERVENTION    Nutrition Goals:  Meet nutritional needs   Maintain current wt   Start regular exercise regimen of at least 150 minutes weekly  Adequate hydration daily per MD order     Nutrition Education:   -Post renal transplant nutrition goals and expectations  -Brief review of food safety guidelines  -Staying hydrated per MD recommendations   -Recommendations for strength and endurance    Materials Provided were:  List of recommendations    Handout explaining prescribed diet via Epic  RD contact information    NUTRITION EVALUATION & MONITORING:    Laboratory data: Reviewed; 01/25/19 BUN 46 (H), Cret 7.96 (H) these labs have improved; team aware and monitoring  Food and nutrient intake: Adequate  Physical activity patterns: Limited; Needs to start regular exercise regimen once approved by MD to start    Expected Compliance is:  Comprehension of plan good  Readiness for change good  Ability to meet goals good      Follow-up: Next MD appointment, or when consulted    Length of visit was: 30 minutes    Greta Doom, MS, RDN, CSG, LDN  [phone] pager    Dr. Nestor Lewandowsky was available if needed for this visit.    This patient visit was completed through the use of an audio/video or telephone encounter.      This patient encounter is appropriate and reasonable under the circumstances given the patient's particular presentation at this time. The patient has been advised of the potential risks and limitations of this mode of treatment (including, but not limited to, the absence of in-person examination) and has agreed to be treated in a remote fashion in spite of them. Any and all of the patient's/patient's family's questions on this issue have been answered.     The patient has also been advised to contact this office for worsening conditions or problems, and seek emergency medical treatment and/or call 911 if the patient deems either necessary.

## 2019-01-23 ENCOUNTER — Ambulatory Visit: Admit: 2019-01-23 | Discharge: 2019-01-24 | Payer: MEDICARE

## 2019-01-23 DIAGNOSIS — Z940 Kidney transplant status: Principal | ICD-10-CM

## 2019-01-23 LAB — CBC W/ AUTO DIFF
BASOPHILS ABSOLUTE COUNT: 0.1 10*9/L (ref 0.0–0.1)
BASOPHILS RELATIVE PERCENT: 1.6 %
EOSINOPHILS ABSOLUTE COUNT: 0.1 10*9/L (ref 0.0–0.4)
HEMATOCRIT: 27.2 % — ABNORMAL LOW (ref 36.0–46.0)
LARGE UNSTAINED CELLS: 1 % (ref 0–4)
LYMPHOCYTES ABSOLUTE COUNT: 0.1 10*9/L — ABNORMAL LOW (ref 1.5–5.0)
LYMPHOCYTES RELATIVE PERCENT: 1.1 %
MEAN CORPUSCULAR HEMOGLOBIN CONC: 33.7 g/dL (ref 31.0–37.0)
MEAN CORPUSCULAR HEMOGLOBIN: 31.5 pg (ref 26.0–34.0)
MEAN CORPUSCULAR VOLUME: 93.4 fL (ref 80.0–100.0)
MEAN PLATELET VOLUME: 6.8 fL — ABNORMAL LOW (ref 7.0–10.0)
MONOCYTES ABSOLUTE COUNT: 0.1 10*9/L — ABNORMAL LOW (ref 0.2–0.8)
MONOCYTES RELATIVE PERCENT: 2.7 %
NEUTROPHILS ABSOLUTE COUNT: 3.7 10*9/L (ref 2.0–7.5)
NEUTROPHILS RELATIVE PERCENT: 90.8 %
NEUTROPHILS RELATIVE PERCENT: 90.8 % — ABNORMAL LOW (ref 0.2–0.8)
PLATELET COUNT: 484 10*9/L — ABNORMAL HIGH (ref 150–440)
RED BLOOD CELL COUNT: 2.91 10*12/L — ABNORMAL LOW (ref 4.00–5.20)
WBC ADJUSTED: 4.1 10*9/L — ABNORMAL LOW (ref 4.5–11.0)

## 2019-01-23 LAB — COMPREHENSIVE METABOLIC PANEL
ALBUMIN: 4.4 g/dL (ref 3.5–5.0)
ALKALINE PHOSPHATASE: 66 U/L (ref 38–126)
ALT (SGPT): 19 U/L (ref <35)
ANION GAP: 18 mmol/L — ABNORMAL HIGH (ref 7–15)
AST (SGOT): 21 U/L (ref 14–38)
BILIRUBIN TOTAL: 0.5 mg/dL (ref 0.0–1.2)
BLOOD UREA NITROGEN: 48 mg/dL — ABNORMAL HIGH (ref 7–21)
CALCIUM: 10.3 mg/dL — ABNORMAL HIGH (ref 8.5–10.2)
CO2: 25 mmol/L (ref 22.0–30.0)
CREATININE: 8.88 mg/dL — ABNORMAL HIGH (ref 0.60–1.00)
EGFR CKD-EPI AA FEMALE: 6 mL/min/{1.73_m2} — ABNORMAL LOW (ref >=60)
EGFR CKD-EPI NON-AA FEMALE: 5 mL/min/{1.73_m2} — ABNORMAL LOW (ref >=60)
EGFR CKD-EPI NON-AA FEMALE: 5 mL/min/1.73m2 — ABNORMAL LOW (ref >=60–5.0)
GLUCOSE RANDOM: 102 mg/dL (ref 65–179)
POTASSIUM: 4.8 mmol/L (ref 3.5–5.0)
PROTEIN TOTAL: 6.9 g/dL (ref 6.5–8.3)
SODIUM: 139 mmol/L (ref 135–145)

## 2019-01-24 MED FILL — MYFORTIC 180 MG TABLET,DELAYED RELEASE: ORAL | 30 days supply | Qty: 180 | Fill #1

## 2019-01-24 MED FILL — VALGANCICLOVIR 450 MG TABLET: 28 days supply | Qty: 8 | Fill #0 | Status: AC

## 2019-01-24 MED FILL — SULFAMETHOXAZOLE 400 MG-TRIMETHOPRIM 80 MG TABLET: ORAL | 28 days supply | Qty: 12 | Fill #1

## 2019-01-24 MED FILL — MYFORTIC 180 MG TABLET,DELAYED RELEASE: 30 days supply | Qty: 180 | Fill #1 | Status: AC

## 2019-01-24 MED FILL — SULFAMETHOXAZOLE 400 MG-TRIMETHOPRIM 80 MG TABLET: 28 days supply | Qty: 12 | Fill #1 | Status: AC

## 2019-01-24 MED FILL — PREDNISONE 5 MG TABLET: 30 days supply | Qty: 30 | Fill #0 | Status: AC

## 2019-01-24 MED FILL — PROGRAF 1 MG CAPSULE: 30 days supply | Qty: 150 | Fill #0 | Status: AC

## 2019-01-24 NOTE — Unmapped (Signed)
Kimberly [name] 's Midodrine shipment will be delayed due to Refill too soon until 4/03 We have contacted the patient and communicated change We will reschedule the medication for the delivery date that the patient agreed upon. We have confirmed the delivery date as 4/06 .    Kimberly [name] 's Asprin shipment will be delayed due to Refill too soon until 4/3 We have contacted the patient and communicated the delivery change to patient/caregiver We will reschedule the medication for the delivery date that the patient agreed upon. We have confirmed the delivery date as 4/06 .    Kimberly [name] 's Omeprazole shipment will be delayed due to Refill too soon until 4/03 We have contacted the patient and communicated the delivery change to patient/caregiver We will reschedule the medication for the delivery date that the patient agreed upon. We have confirmed the delivery date as 4/06 .

## 2019-01-25 ENCOUNTER — Ambulatory Visit: Admit: 2019-01-25 | Discharge: 2019-01-26 | Payer: MEDICARE

## 2019-01-25 DIAGNOSIS — Z940 Kidney transplant status: Principal | ICD-10-CM

## 2019-01-25 LAB — COMPREHENSIVE METABOLIC PANEL
ALBUMIN: 4.2 g/dL (ref 3.5–5.0)
ALKALINE PHOSPHATASE: 72 U/L (ref 38–126)
ANION GAP: 15 mmol/L (ref 7–15)
AST (SGOT): 21 U/L (ref 14–38)
BILIRUBIN TOTAL: 0.4 mg/dL (ref 0.0–1.2)
BLOOD UREA NITROGEN: 46 mg/dL — ABNORMAL HIGH (ref 7–21)
BUN / CREAT RATIO: 6
CALCIUM: 10.2 mg/dL (ref 8.5–10.2)
CHLORIDE: 97 mmol/L — ABNORMAL LOW (ref 98–107)
CO2: 29 mmol/L (ref 22.0–30.0)
CREATININE: 7.98 mg/dL — ABNORMAL HIGH (ref 0.60–1.00)
EGFR CKD-EPI AA FEMALE: 7 mL/min/{1.73_m2} — ABNORMAL LOW (ref >=60)
EGFR CKD-EPI NON-AA FEMALE: 6 mL/min/{1.73_m2} — ABNORMAL LOW (ref >=60)
GLUCOSE RANDOM: 109 mg/dL (ref 65–179)
GLUCOSE RANDOM: 109 mg/dL — ABNORMAL LOW (ref 65–179)
PROTEIN TOTAL: 6.7 g/dL (ref 6.5–8.3)
SODIUM: 141 mmol/L (ref 135–145)

## 2019-01-25 LAB — CBC W/ AUTO DIFF
BASOPHILS ABSOLUTE COUNT: 0.1 10*9/L (ref 0.0–0.1)
EOSINOPHILS ABSOLUTE COUNT: 0.1 10*9/L (ref 0.0–0.4)
EOSINOPHILS RELATIVE PERCENT: 1.6 %
HEMATOCRIT: 27.1 % — ABNORMAL LOW (ref 36.0–46.0)
HEMOGLOBIN: 9 g/dL — ABNORMAL LOW (ref 13.5–16.0)
LARGE UNSTAINED CELLS: 1 % (ref 0–4)
LYMPHOCYTES ABSOLUTE COUNT: 0 10*9/L — ABNORMAL LOW (ref 1.5–5.0)
LYMPHOCYTES ABSOLUTE COUNT: 0 10*9/L — ABNORMAL LOW (ref 1.5–5.0)
MEAN CORPUSCULAR HEMOGLOBIN CONC: 33.1 g/dL (ref 31.0–37.0)
MEAN CORPUSCULAR VOLUME: 93.5 fL (ref 80.0–100.0)
MEAN PLATELET VOLUME: 6.9 fL — ABNORMAL LOW (ref 7.0–10.0)
MONOCYTES ABSOLUTE COUNT: 0.1 10*9/L — ABNORMAL LOW (ref 0.2–0.8)
MONOCYTES RELATIVE PERCENT: 3.4 %
NEUTROPHILS ABSOLUTE COUNT: 3.6 10*9/L (ref 2.0–7.5)
NEUTROPHILS RELATIVE PERCENT: 90.6 %
PLATELET COUNT: 464 10*9/L — ABNORMAL HIGH (ref 150–440)
RED BLOOD CELL COUNT: 2.89 10*12/L — ABNORMAL LOW (ref 4.00–5.20)
RED CELL DISTRIBUTION WIDTH: 14.6 % (ref 12.0–15.0)
WBC ADJUSTED: 4 10*9/L — ABNORMAL LOW (ref 4.5–11.0)

## 2019-01-25 MED FILL — ASPIRIN 81 MG TABLET,DELAYED RELEASE: 30 days supply | Qty: 30 | Fill #1 | Status: AC

## 2019-01-25 MED FILL — CALCITRIOL 0.25 MCG CAPSULE: ORAL | 30 days supply | Qty: 30 | Fill #1

## 2019-01-25 MED FILL — OMEPRAZOLE 40 MG CAPSULE,DELAYED RELEASE: 30 days supply | Qty: 30 | Fill #1 | Status: AC

## 2019-01-25 MED FILL — MIDODRINE 5 MG TABLET: 30 days supply | Qty: 270 | Fill #1 | Status: AC

## 2019-01-25 MED FILL — ASPIRIN 81 MG TABLET,DELAYED RELEASE: ORAL | 30 days supply | Qty: 30 | Fill #1

## 2019-01-25 MED FILL — OMEPRAZOLE 40 MG CAPSULE,DELAYED RELEASE: ORAL | 30 days supply | Qty: 30 | Fill #1

## 2019-01-25 MED FILL — MIDODRINE 5 MG TABLET: ORAL | 30 days supply | Qty: 270 | Fill #1

## 2019-01-25 MED FILL — CALCITRIOL 0.25 MCG CAPSULE: 30 days supply | Qty: 30 | Fill #1 | Status: AC

## 2019-01-25 NOTE — Unmapped (Signed)
1. Eat 3 meals daily with appropriate protein, as discussed  2. Adequate hydration per Dr. Tod Persia orders of 1-1.5 liters daily   3. Exercise when able once approval given by MD for about 150 minutes weekly  4. Keep following food safety guidelines

## 2019-01-28 ENCOUNTER — Ambulatory Visit: Admit: 2019-01-28 | Discharge: 2019-01-29 | Payer: MEDICARE

## 2019-01-28 DIAGNOSIS — Z940 Kidney transplant status: Principal | ICD-10-CM

## 2019-01-28 LAB — COMPREHENSIVE METABOLIC PANEL
ALBUMIN: 4.2 g/dL (ref 3.5–5.0)
ALKALINE PHOSPHATASE: 74 U/L (ref 38–126)
ALT (SGPT): 18 U/L (ref <35)
ANION GAP: 17 mmol/L — ABNORMAL HIGH (ref 7–15)
AST (SGOT): 17 U/L (ref 14–38)
BILIRUBIN TOTAL: 0.3 mg/dL (ref 0.0–1.2)
BLOOD UREA NITROGEN: 45 mg/dL — ABNORMAL HIGH (ref 7–21)
BUN / CREAT RATIO: 5
BUN / CREAT RATIO: 5 mg/dL — ABNORMAL HIGH (ref 0.60–1.00)
CALCIUM: 9.8 mg/dL (ref 8.5–10.2)
CHLORIDE: 94 mmol/L — ABNORMAL LOW (ref 98–107)
CO2: 30 mmol/L (ref 22.0–30.0)
CREATININE: 8.35 mg/dL — ABNORMAL HIGH (ref 0.60–1.00)
EGFR CKD-EPI AA FEMALE: 6 mL/min/{1.73_m2} — ABNORMAL LOW (ref >=60)
EGFR CKD-EPI NON-AA FEMALE: 6 mL/min/{1.73_m2} — ABNORMAL LOW (ref >=60)
GLUCOSE RANDOM: 105 mg/dL (ref 65–179)
POTASSIUM: 4.4 mmol/L (ref 3.5–5.0)
PROTEIN TOTAL: 6.7 g/dL (ref 6.5–8.3)

## 2019-01-28 LAB — TACROLIMUS LEVEL: TACROLIMUS BLOOD: 7.2 ng/mL

## 2019-01-28 LAB — CBC W/ AUTO DIFF
BASOPHILS ABSOLUTE COUNT: 0.1 10*9/L (ref 0.0–0.1)
BASOPHILS RELATIVE PERCENT: 1.8 %
EOSINOPHILS ABSOLUTE COUNT: 0.1 10*9/L (ref 0.0–0.4)
EOSINOPHILS RELATIVE PERCENT: 2 %
HEMATOCRIT: 26.7 % — ABNORMAL LOW (ref 36.0–46.0)
HEMOGLOBIN: 9.1 g/dL — ABNORMAL LOW (ref 13.5–16.0)
LARGE UNSTAINED CELLS: 2 % (ref 0–4)
LYMPHOCYTES ABSOLUTE COUNT: 0.1 10*9/L — ABNORMAL LOW (ref 1.5–5.0)
LYMPHOCYTES RELATIVE PERCENT: 1.4 %
MEAN CORPUSCULAR HEMOGLOBIN CONC: 34.1 g/dL (ref 31.0–37.0)
MEAN CORPUSCULAR HEMOGLOBIN: 31.8 pg (ref 26.0–34.0)
MEAN CORPUSCULAR HEMOGLOBIN: 31.8 pg — ABNORMAL LOW (ref 26.0–34.0)
MEAN CORPUSCULAR VOLUME: 93.4 fL (ref 80.0–100.0)
MEAN PLATELET VOLUME: 7 fL (ref 7.0–10.0)
MONOCYTES ABSOLUTE COUNT: 0.1 10*9/L — ABNORMAL LOW (ref 0.2–0.8)
MONOCYTES RELATIVE PERCENT: 2.6 %
NEUTROPHILS RELATIVE PERCENT: 90.6 %
PLATELET COUNT: 493 10*9/L — ABNORMAL HIGH (ref 150–440)
RED BLOOD CELL COUNT: 2.86 10*12/L — ABNORMAL LOW (ref 4.00–5.20)
WBC ADJUSTED: 4.2 10*9/L — ABNORMAL LOW (ref 4.5–11.0)

## 2019-01-30 ENCOUNTER — Ambulatory Visit: Admit: 2019-01-30 | Discharge: 2019-01-31 | Payer: MEDICARE

## 2019-01-30 DIAGNOSIS — Z940 Kidney transplant status: Principal | ICD-10-CM

## 2019-01-30 LAB — COMPREHENSIVE METABOLIC PANEL
ALBUMIN: 4.2 g/dL (ref 3.5–5.0)
ALT (SGPT): 23 U/L (ref <35)
ALT (SGPT): 23 U/L — ABNORMAL HIGH (ref 7–<35)
ANION GAP: 16 mmol/L — ABNORMAL HIGH (ref 7–15)
AST (SGOT): 19 U/L (ref 14–38)
BILIRUBIN TOTAL: 0.3 mg/dL (ref 0.0–1.2)
BLOOD UREA NITROGEN: 47 mg/dL — ABNORMAL HIGH (ref 7–21)
BUN / CREAT RATIO: 6
CALCIUM: 10.1 mg/dL (ref 8.5–10.2)
CHLORIDE: 95 mmol/L — ABNORMAL LOW (ref 98–107)
CO2: 29 mmol/L (ref 22.0–30.0)
CREATININE: 7.86 mg/dL — ABNORMAL HIGH (ref 0.60–1.00)
EGFR CKD-EPI AA FEMALE: 7 mL/min/{1.73_m2} — ABNORMAL LOW (ref >=60)
EGFR CKD-EPI NON-AA FEMALE: 6 mL/min/{1.73_m2} — ABNORMAL LOW (ref >=60)
POTASSIUM: 4 mmol/L (ref 3.5–5.0)
PROTEIN TOTAL: 6.7 g/dL (ref 6.5–8.3)
SODIUM: 140 mmol/L (ref 135–145)

## 2019-01-30 LAB — CBC W/ AUTO DIFF
BASOPHILS ABSOLUTE COUNT: 0.1 10*9/L (ref 0.0–0.1)
BASOPHILS RELATIVE PERCENT: 2 %
EOSINOPHILS ABSOLUTE COUNT: 0.1 10*9/L (ref 0.0–0.4)
EOSINOPHILS RELATIVE PERCENT: 1.5 %
HEMATOCRIT: 26.9 % — ABNORMAL LOW (ref 36.0–46.0)
HEMOGLOBIN: 8.9 g/dL — ABNORMAL LOW (ref 13.5–16.0)
LARGE UNSTAINED CELLS: 1 % (ref 0–4)
LYMPHOCYTES ABSOLUTE COUNT: 0.1 10*9/L — ABNORMAL LOW (ref 1.5–5.0)
LYMPHOCYTES RELATIVE PERCENT: 1 %
MEAN CORPUSCULAR HEMOGLOBIN CONC: 33 g/dL (ref 31.0–37.0)
MEAN CORPUSCULAR HEMOGLOBIN: 30.9 pg (ref 26.0–34.0)
MEAN CORPUSCULAR VOLUME: 93.6 fL (ref 80.0–100.0)
MEAN PLATELET VOLUME: 7.3 fL (ref 7.0–10.0)
MONOCYTES RELATIVE PERCENT: 2.6 %
NEUTROPHILS ABSOLUTE COUNT: 4.7 10*9/L (ref 2.0–7.5)
NEUTROPHILS RELATIVE PERCENT: 91.8 %
RED BLOOD CELL COUNT: 2.87 10*12/L — ABNORMAL LOW (ref 4.00–5.20)
RED CELL DISTRIBUTION WIDTH: 15 % (ref 12.0–15.0)
WBC ADJUSTED: 5.1 10*9/L (ref 4.5–11.0)

## 2019-02-01 ENCOUNTER — Ambulatory Visit: Admit: 2019-02-01 | Discharge: 2019-02-02 | Payer: MEDICARE

## 2019-02-01 DIAGNOSIS — Z940 Kidney transplant status: Principal | ICD-10-CM

## 2019-02-01 LAB — COMPREHENSIVE METABOLIC PANEL
ALBUMIN: 4.2 g/dL (ref 3.5–5.0)
ALKALINE PHOSPHATASE: 84 U/L (ref 38–126)
ALT (SGPT): 23 U/L (ref <35)
ANION GAP: 14 mmol/L (ref 7–15)
AST (SGOT): 21 U/L (ref 14–38)
BILIRUBIN TOTAL: 0.3 mg/dL (ref 0.0–1.2)
BUN / CREAT RATIO: 7
CALCIUM: 9.8 mg/dL (ref 8.5–10.2)
CHLORIDE: 95 mmol/L — ABNORMAL LOW (ref 98–107)
CO2: 32 mmol/L — ABNORMAL HIGH (ref 22.0–30.0)
CREATININE: 5.78 mg/dL — ABNORMAL HIGH (ref 0.60–1.00)
EGFR CKD-EPI AA FEMALE: 10 mL/min/{1.73_m2} — ABNORMAL LOW (ref >=60)
EGFR CKD-EPI NON-AA FEMALE: 9 mL/min/{1.73_m2} — ABNORMAL LOW (ref >=60)
GLUCOSE RANDOM: 103 mg/dL (ref 65–179)
POTASSIUM: 4 mmol/L (ref 3.5–5.0)
PROTEIN TOTAL: 6.8 g/dL (ref 6.5–8.3)
SODIUM: 141 mmol/L (ref 135–145)

## 2019-02-01 LAB — CBC W/ AUTO DIFF
BASOPHILS ABSOLUTE COUNT: 0.1 10*9/L (ref 0.0–0.1)
BASOPHILS RELATIVE PERCENT: 1.6 %
EOSINOPHILS ABSOLUTE COUNT: 0.1 10*9/L (ref 0.0–0.4)
EOSINOPHILS RELATIVE PERCENT: 0.9 %
HEMATOCRIT: 26 % — ABNORMAL LOW (ref 36.0–46.0)
HEMOGLOBIN: 8.8 g/dL — ABNORMAL LOW (ref 13.5–16.0)
LARGE UNSTAINED CELLS: 1 % (ref 0–4)
LYMPHOCYTES ABSOLUTE COUNT: 0 10*9/L — ABNORMAL LOW (ref 1.5–5.0)
LYMPHOCYTES RELATIVE PERCENT: 0.7 %
MEAN CORPUSCULAR HEMOGLOBIN CONC: 33.7 g/dL (ref 31.0–37.0)
MEAN CORPUSCULAR VOLUME: 93.1 fL (ref 80.0–100.0)
MEAN PLATELET VOLUME: 6.7 fL — ABNORMAL LOW (ref 7.0–10.0)
MONOCYTES ABSOLUTE COUNT: 0.2 10*9/L (ref 0.2–0.8)
MONOCYTES RELATIVE PERCENT: 2.4 %
NEUTROPHILS RELATIVE PERCENT: 93.5 %
PLATELET COUNT: 440 10*9/L (ref 150–440)
RED CELL DISTRIBUTION WIDTH: 14.8 % (ref 12.0–15.0)
WBC ADJUSTED: 6.3 10*9/L (ref 4.5–11.0)

## 2019-02-04 ENCOUNTER — Ambulatory Visit: Admit: 2019-02-04 | Discharge: 2019-02-05 | Payer: MEDICARE

## 2019-02-04 DIAGNOSIS — Z940 Kidney transplant status: Principal | ICD-10-CM

## 2019-02-04 LAB — COMPREHENSIVE METABOLIC PANEL
ALBUMIN: 4.5 g/dL (ref 3.5–5.0)
ALKALINE PHOSPHATASE: 81 U/L (ref 38–126)
ALT (SGPT): 22 U/L (ref <35)
AST (SGOT): 18 U/L (ref 14–38)
AST (SGOT): 18 U/L — ABNORMAL HIGH (ref 14–38)
BILIRUBIN TOTAL: 0.3 mg/dL (ref 0.0–1.2)
BLOOD UREA NITROGEN: 42 mg/dL — ABNORMAL HIGH (ref 7–21)
BUN / CREAT RATIO: 8
CALCIUM: 9.8 mg/dL (ref 8.5–10.2)
CHLORIDE: 96 mmol/L — ABNORMAL LOW (ref 98–107)
CO2: 30 mmol/L (ref 22.0–30.0)
CREATININE: 5.21 mg/dL — ABNORMAL HIGH (ref 0.60–1.00)
EGFR CKD-EPI AA FEMALE: 11 mL/min/{1.73_m2} — ABNORMAL LOW (ref >=60)
GLUCOSE RANDOM: 105 mg/dL (ref 65–179)
POTASSIUM: 3.7 mmol/L (ref 3.5–5.0)
PROTEIN TOTAL: 7.1 g/dL (ref 6.5–8.3)
SODIUM: 140 mmol/L (ref 135–145)

## 2019-02-04 LAB — CBC W/ AUTO DIFF
BASOPHILS ABSOLUTE COUNT: 0.1 10*9/L (ref 0.0–0.1)
BASOPHILS RELATIVE PERCENT: 1.6 %
EOSINOPHILS ABSOLUTE COUNT: 0.1 10*9/L (ref 0.0–0.4)
EOSINOPHILS ABSOLUTE COUNT: 0.1 10*9/L — ABNORMAL LOW (ref 0.0–0.4)
EOSINOPHILS RELATIVE PERCENT: 1.6 %
HEMATOCRIT: 26.5 % — ABNORMAL LOW (ref 36.0–46.0)
HEMOGLOBIN: 9.1 g/dL — ABNORMAL LOW (ref 13.5–16.0)
LYMPHOCYTES ABSOLUTE COUNT: 0.1 10*9/L — ABNORMAL LOW (ref 1.5–5.0)
LYMPHOCYTES RELATIVE PERCENT: 1 %
MEAN CORPUSCULAR HEMOGLOBIN CONC: 34.2 g/dL (ref 31.0–37.0)
MEAN CORPUSCULAR HEMOGLOBIN: 32 pg (ref 26.0–34.0)
MEAN CORPUSCULAR VOLUME: 93.5 fL (ref 80.0–100.0)
MEAN PLATELET VOLUME: 6.7 fL — ABNORMAL LOW (ref 7.0–10.0)
MONOCYTES ABSOLUTE COUNT: 0.2 10*9/L (ref 0.2–0.8)
MONOCYTES RELATIVE PERCENT: 2.4 %
NEUTROPHILS ABSOLUTE COUNT: 6.3 10*9/L (ref 2.0–7.5)
NEUTROPHILS RELATIVE PERCENT: 92.4 %
RED BLOOD CELL COUNT: 2.84 10*12/L — ABNORMAL LOW (ref 4.00–5.20)
RED CELL DISTRIBUTION WIDTH: 14.8 % (ref 12.0–15.0)
WBC ADJUSTED: 6.8 10*9/L (ref 4.5–11.0)

## 2019-02-06 ENCOUNTER — Ambulatory Visit: Admit: 2019-02-06 | Discharge: 2019-02-07 | Payer: MEDICARE

## 2019-02-06 DIAGNOSIS — Z940 Kidney transplant status: Principal | ICD-10-CM

## 2019-02-06 LAB — CBC W/ AUTO DIFF
BASOPHILS ABSOLUTE COUNT: 0.1 10*9/L (ref 0.0–0.1)
BASOPHILS RELATIVE PERCENT: 2 %
EOSINOPHILS ABSOLUTE COUNT: 0.1 10*9/L (ref 0.0–0.4)
EOSINOPHILS RELATIVE PERCENT: 1.6 %
HEMATOCRIT: 25.9 % — ABNORMAL LOW (ref 36.0–46.0)
HEMOGLOBIN: 8.7 g/dL — ABNORMAL LOW (ref 13.5–16.0)
LARGE UNSTAINED CELLS: 1 % (ref 0–4)
LYMPHOCYTES ABSOLUTE COUNT: 0.1 10*9/L — ABNORMAL LOW (ref 1.5–5.0)
LYMPHOCYTES RELATIVE PERCENT: 1 %
MEAN CORPUSCULAR HEMOGLOBIN CONC: 33.4 g/dL (ref 31.0–37.0)
MEAN CORPUSCULAR HEMOGLOBIN: 31.5 pg (ref 26.0–34.0)
MEAN CORPUSCULAR VOLUME: 94.3 fL (ref 80.0–100.0)
MONOCYTES ABSOLUTE COUNT: 0.2 10*9/L (ref 0.2–0.8)
MONOCYTES RELATIVE PERCENT: 3.6 %
NEUTROPHILS ABSOLUTE COUNT: 5.5 10*9/L (ref 2.0–7.5)
RED BLOOD CELL COUNT: 2.75 10*12/L — ABNORMAL LOW (ref 4.00–5.20)
RED CELL DISTRIBUTION WIDTH: 14.6 % (ref 12.0–15.0)
RED CELL DISTRIBUTION WIDTH: 14.6 % — ABNORMAL LOW (ref 12.0–15.0)
WBC ADJUSTED: 6.1 10*9/L (ref 4.5–11.0)

## 2019-02-06 LAB — COMPREHENSIVE METABOLIC PANEL
ALBUMIN: 4.3 g/dL (ref 3.5–5.0)
ALKALINE PHOSPHATASE: 66 U/L (ref 38–126)
ALT (SGPT): 17 U/L (ref <35)
ANION GAP: 16 mmol/L — ABNORMAL HIGH (ref 7–15)
BILIRUBIN TOTAL: 0.3 mg/dL (ref 0.0–1.2)
BLOOD UREA NITROGEN: 60 mg/dL — ABNORMAL HIGH (ref 7–21)
BUN / CREAT RATIO: 9
CALCIUM: 10.2 mg/dL (ref 8.5–10.2)
CHLORIDE: 101 mmol/L (ref 98–107)
CREATININE: 6.64 mg/dL — ABNORMAL HIGH (ref 0.60–1.00)
EGFR CKD-EPI AA FEMALE: 8 mL/min/{1.73_m2} — ABNORMAL LOW (ref >=60)
EGFR CKD-EPI NON-AA FEMALE: 7 mL/min/{1.73_m2} — ABNORMAL LOW (ref >=60)
GLUCOSE RANDOM: 101 mg/dL (ref 65–179)
POTASSIUM: 4.3 mmol/L (ref 3.5–5.0)
POTASSIUM: 4.3 mmol/L — ABNORMAL LOW (ref 3.5–5.0)
PROTEIN TOTAL: 6.7 g/dL (ref 6.5–8.3)
SODIUM: 142 mmol/L (ref 135–145)

## 2019-02-08 ENCOUNTER — Ambulatory Visit: Admit: 2019-02-08 | Discharge: 2019-02-09 | Payer: MEDICARE

## 2019-02-08 DIAGNOSIS — Z940 Kidney transplant status: Principal | ICD-10-CM

## 2019-02-08 LAB — COMPREHENSIVE METABOLIC PANEL
ALBUMIN: 4.4 g/dL (ref 3.5–5.0)
ALKALINE PHOSPHATASE: 71 U/L (ref 38–126)
ALT (SGPT): 21 U/L (ref <35)
ANION GAP: 15 mmol/L (ref 7–15)
BILIRUBIN TOTAL: 0.4 mg/dL (ref 0.0–1.2)
BLOOD UREA NITROGEN: 44 mg/dL — ABNORMAL HIGH (ref 7–21)
BUN / CREAT RATIO: 10
CALCIUM: 10 mg/dL (ref 8.5–10.2)
CHLORIDE: 97 mmol/L — ABNORMAL LOW (ref 98–107)
CO2: 28 mmol/L (ref 22.0–30.0)
CREATININE: 4.63 mg/dL — ABNORMAL HIGH (ref 0.60–1.00)
EGFR CKD-EPI AA FEMALE: 13 mL/min/{1.73_m2} — ABNORMAL LOW (ref >=60)
POTASSIUM: 3.6 mmol/L (ref 3.5–5.0)
PROTEIN TOTAL: 6.9 g/dL (ref 6.5–8.3)
PROTEIN TOTAL: 6.9 g/dL — ABNORMAL HIGH (ref 6.5–8.3)
SODIUM: 140 mmol/L (ref 135–145)

## 2019-02-08 LAB — CBC W/ AUTO DIFF
BASOPHILS ABSOLUTE COUNT: 0.1 10*9/L (ref 0.0–0.1)
BASOPHILS RELATIVE PERCENT: 1.5 %
EOSINOPHILS ABSOLUTE COUNT: 0.1 10*9/L (ref 0.0–0.4)
EOSINOPHILS RELATIVE PERCENT: 1.1 %
HEMOGLOBIN: 9.3 g/dL — ABNORMAL LOW (ref 13.5–16.0)
LARGE UNSTAINED CELLS: 1 % (ref 0–4)
LYMPHOCYTES RELATIVE PERCENT: 1 %
MEAN CORPUSCULAR HEMOGLOBIN CONC: 33.7 g/dL (ref 31.0–37.0)
MEAN CORPUSCULAR HEMOGLOBIN: 31.3 pg (ref 26.0–34.0)
MEAN CORPUSCULAR VOLUME: 92.9 fL (ref 80.0–100.0)
MEAN PLATELET VOLUME: 6.7 fL — ABNORMAL LOW (ref 7.0–10.0)
MONOCYTES ABSOLUTE COUNT: 0.2 10*9/L (ref 0.2–0.8)
MONOCYTES RELATIVE PERCENT: 2.8 %
NEUTROPHILS ABSOLUTE COUNT: 6 10*9/L (ref 2.0–7.5)
NEUTROPHILS RELATIVE PERCENT: 92.7 %
PLATELET COUNT: 431 10*9/L (ref 150–440)
RED BLOOD CELL COUNT: 2.97 10*12/L — ABNORMAL LOW (ref 4.00–5.20)
WBC ADJUSTED: 6.4 10*9/L (ref 4.5–11.0)

## 2019-02-11 ENCOUNTER — Ambulatory Visit: Admit: 2019-02-11 | Discharge: 2019-02-12 | Payer: MEDICARE

## 2019-02-11 DIAGNOSIS — Z940 Kidney transplant status: Principal | ICD-10-CM

## 2019-02-11 LAB — COMPREHENSIVE METABOLIC PANEL
ALBUMIN: 4.4 g/dL (ref 3.5–5.0)
ALKALINE PHOSPHATASE: 63 U/L (ref 38–126)
ALT (SGPT): 16 U/L (ref <35)
ANION GAP: 17 mmol/L — ABNORMAL HIGH (ref 7–15)
AST (SGOT): 14 U/L (ref 14–38)
BLOOD UREA NITROGEN: 63 mg/dL — ABNORMAL HIGH (ref 7–21)
BUN / CREAT RATIO: 10
CALCIUM: 9.9 mg/dL (ref 8.5–10.2)
CHLORIDE: 101 mmol/L (ref 98–107)
CO2: 23 mmol/L (ref 22.0–30.0)
CO2: 23 mmol/L — ABNORMAL LOW (ref 22.0–30.0)
CREATININE: 6.07 mg/dL — ABNORMAL HIGH (ref 0.60–1.00)
EGFR CKD-EPI AA FEMALE: 9 mL/min/{1.73_m2} — ABNORMAL LOW (ref >=60)
EGFR CKD-EPI NON-AA FEMALE: 8 mL/min/{1.73_m2} — ABNORMAL LOW (ref >=60)
GLUCOSE RANDOM: 100 mg/dL (ref 65–179)
POTASSIUM: 3.6 mmol/L (ref 3.5–5.0)
PROTEIN TOTAL: 6.6 g/dL (ref 6.5–8.3)
SODIUM: 141 mmol/L (ref 135–145)

## 2019-02-11 LAB — CBC W/ AUTO DIFF
BASOPHILS ABSOLUTE COUNT: 0.1 10*9/L (ref 0.0–0.1)
BASOPHILS RELATIVE PERCENT: 1.3 %
EOSINOPHILS ABSOLUTE COUNT: 0.1 10*9/L (ref 0.0–0.4)
HEMATOCRIT: 26.5 % — ABNORMAL LOW (ref 36.0–46.0)
LARGE UNSTAINED CELLS: 1 % (ref 0–4)
LYMPHOCYTES ABSOLUTE COUNT: 0.1 10*9/L — ABNORMAL LOW (ref 1.5–5.0)
MEAN CORPUSCULAR HEMOGLOBIN CONC: 34 g/dL (ref 31.0–37.0)
MEAN CORPUSCULAR HEMOGLOBIN: 31.9 pg (ref 26.0–34.0)
MEAN CORPUSCULAR VOLUME: 94.1 fL (ref 80.0–100.0)
MEAN PLATELET VOLUME: 6.9 fL — ABNORMAL LOW (ref 7.0–10.0)
MONOCYTES ABSOLUTE COUNT: 0.2 10*9/L (ref 0.2–0.8)
MONOCYTES RELATIVE PERCENT: 3.3 %
NEUTROPHILS ABSOLUTE COUNT: 6.1 10*9/L (ref 2.0–7.5)
NEUTROPHILS RELATIVE PERCENT: 92.7 %
PLATELET COUNT: 416 10*9/L (ref 150–440)
PLATELET COUNT: 416 10*9/L — ABNORMAL LOW (ref 150–440)
RED BLOOD CELL COUNT: 2.82 10*12/L — ABNORMAL LOW (ref 4.00–5.20)
RED CELL DISTRIBUTION WIDTH: 14.4 % (ref 12.0–15.0)
WBC ADJUSTED: 6.6 10*9/L (ref 4.5–11.0)

## 2019-02-13 ENCOUNTER — Ambulatory Visit: Admit: 2019-02-13 | Discharge: 2019-02-14 | Payer: MEDICARE

## 2019-02-13 DIAGNOSIS — Z940 Kidney transplant status: Principal | ICD-10-CM

## 2019-02-13 LAB — CBC W/ AUTO DIFF
BASOPHILS ABSOLUTE COUNT: 0.1 10*9/L (ref 0.0–0.1)
BASOPHILS RELATIVE PERCENT: 1.2 %
EOSINOPHILS ABSOLUTE COUNT: 0.1 10*9/L (ref 0.0–0.4)
HEMATOCRIT: 26.6 % — ABNORMAL LOW (ref 36.0–46.0)
HEMOGLOBIN: 9 g/dL — ABNORMAL LOW (ref 13.5–16.0)
LARGE UNSTAINED CELLS: 1 % (ref 0–4)
LYMPHOCYTES RELATIVE PERCENT: 1.6 %
MEAN CORPUSCULAR HEMOGLOBIN CONC: 33.7 g/dL (ref 31.0–37.0)
MEAN CORPUSCULAR HEMOGLOBIN: 31.7 pg (ref 26.0–34.0)
MEAN CORPUSCULAR VOLUME: 94.1 fL (ref 80.0–100.0)
MONOCYTES ABSOLUTE COUNT: 0.2 10*9/L (ref 0.2–0.8)
MONOCYTES RELATIVE PERCENT: 3.1 %
NEUTROPHILS ABSOLUTE COUNT: 6.6 10*9/L (ref 2.0–7.5)
NEUTROPHILS RELATIVE PERCENT: 91.7 %
PLATELET COUNT: 404 10*9/L (ref 150–440)
RED BLOOD CELL COUNT: 2.83 10*12/L — ABNORMAL LOW (ref 4.00–5.20)
RED CELL DISTRIBUTION WIDTH: 14.7 % (ref 12.0–15.0)
WBC ADJUSTED: 7.2 10*9/L (ref 4.5–11.0)
WBC ADJUSTED: 7.2 10*9/L — ABNORMAL LOW (ref 4.5–11.0)

## 2019-02-13 LAB — COMPREHENSIVE METABOLIC PANEL
ALBUMIN: 4.3 g/dL (ref 3.5–5.0)
ALKALINE PHOSPHATASE: 68 U/L (ref 38–126)
ALT (SGPT): 15 U/L (ref <35)
ANION GAP: 16 mmol/L — ABNORMAL HIGH (ref 7–15)
AST (SGOT): 14 U/L (ref 14–38)
BILIRUBIN TOTAL: 0.3 mg/dL (ref 0.0–1.2)
BLOOD UREA NITROGEN: 54 mg/dL — ABNORMAL HIGH (ref 7–21)
CALCIUM: 10 mg/dL (ref 8.5–10.2)
CO2: 27 mmol/L (ref 22.0–30.0)
CREATININE: 5.35 mg/dL — ABNORMAL HIGH (ref 0.60–1.00)
EGFR CKD-EPI AA FEMALE: 11 mL/min/{1.73_m2} — ABNORMAL LOW (ref >=60)
EGFR CKD-EPI NON-AA FEMALE: 9 mL/min/{1.73_m2} — ABNORMAL LOW (ref >=60)
GLUCOSE RANDOM: 100 mg/dL (ref 65–179)
POTASSIUM: 3.9 mmol/L (ref 3.5–5.0)
PROTEIN TOTAL: 6.7 g/dL (ref 6.5–8.3)
SODIUM: 141 mmol/L (ref 135–145)

## 2019-02-15 ENCOUNTER — Ambulatory Visit: Admit: 2019-02-15 | Discharge: 2019-02-16 | Payer: MEDICARE

## 2019-02-15 DIAGNOSIS — Z940 Kidney transplant status: Principal | ICD-10-CM

## 2019-02-15 LAB — COMPREHENSIVE METABOLIC PANEL
ALBUMIN: 4.5 g/dL (ref 3.5–5.0)
ALKALINE PHOSPHATASE: 81 U/L (ref 38–126)
ALT (SGPT): 16 U/L (ref <35)
ANION GAP: 16 mmol/L — ABNORMAL HIGH (ref 7–15)
AST (SGOT): 15 U/L (ref 14–38)
BILIRUBIN TOTAL: 0.4 mg/dL (ref 0.0–1.2)
BLOOD UREA NITROGEN: 39 mg/dL — ABNORMAL HIGH (ref 7–21)
BUN / CREAT RATIO: 10
CALCIUM: 9.8 mg/dL (ref 8.5–10.2)
CHLORIDE: 94 mmol/L — ABNORMAL LOW (ref 98–107)
CREATININE: 4.09 mg/dL — ABNORMAL HIGH (ref 0.60–1.00)
EGFR CKD-EPI AA FEMALE: 15 mL/min/{1.73_m2} — ABNORMAL LOW (ref >=60)
EGFR CKD-EPI NON-AA FEMALE: 13 mL/min/{1.73_m2} — ABNORMAL LOW (ref >=60)
GLUCOSE RANDOM: 105 mg/dL (ref 65–179)
PROTEIN TOTAL: 7 g/dL (ref 6.5–8.3)
SODIUM: 139 mmol/L (ref 135–145)

## 2019-02-15 LAB — CBC W/ AUTO DIFF
BASOPHILS ABSOLUTE COUNT: 0.1 10*9/L (ref 0.0–0.1)
EOSINOPHILS RELATIVE PERCENT: 1.6 %
HEMATOCRIT: 28 % — ABNORMAL LOW (ref 36.0–46.0)
HEMOGLOBIN: 9.3 g/dL — ABNORMAL LOW (ref 13.5–16.0)
LYMPHOCYTES ABSOLUTE COUNT: 0.1 10*9/L — ABNORMAL LOW (ref 1.5–5.0)
LYMPHOCYTES RELATIVE PERCENT: 1.3 %
MEAN CORPUSCULAR HEMOGLOBIN CONC: 33.1 g/dL (ref 31.0–37.0)
MEAN CORPUSCULAR HEMOGLOBIN: 30.9 pg (ref 26.0–34.0)
MEAN CORPUSCULAR VOLUME: 93.3 fL (ref 80.0–100.0)
MEAN PLATELET VOLUME: 6.8 fL — ABNORMAL LOW (ref 7.0–10.0)
MONOCYTES ABSOLUTE COUNT: 0.2 10*9/L (ref 0.2–0.8)
MONOCYTES ABSOLUTE COUNT: 0.2 10*9/L — ABNORMAL LOW (ref 0.2–0.8)
MONOCYTES RELATIVE PERCENT: 2.2 %
NEUTROPHILS ABSOLUTE COUNT: 6.9 10*9/L (ref 2.0–7.5)
NEUTROPHILS RELATIVE PERCENT: 92.3 %
PLATELET COUNT: 446 10*9/L — ABNORMAL HIGH (ref 150–440)
RED BLOOD CELL COUNT: 3 10*12/L — ABNORMAL LOW (ref 4.00–5.20)
RED CELL DISTRIBUTION WIDTH: 14.5 % (ref 12.0–15.0)
WBC ADJUSTED: 7.5 10*9/L (ref 4.5–11.0)

## 2019-02-15 LAB — MAGNESIUM: MAGNESIUM: 1.6 mg/dL (ref 1.6–2.2)

## 2019-02-15 NOTE — Unmapped (Signed)
Pt agree to come in for biopsy on 4/28 due to elevated Cr and low urine output. Biopsy instructions given.

## 2019-02-15 NOTE — Unmapped (Signed)
[facility] Refill Coordination Note    Specialty Medication(s) to be Shipped:   Transplant: Myfortic 180mg , Prograf 1mg , valgancyclovir 450mg  and Prednisone 5mg   Other medication(s) to be shipped: Aspirin 81mg , Calcitriol 0.79mcg, Midodrine 5mg , Omeprazole 40mg  & Sulfa-trime 400-80mg      Kimberly [name], DOB: 09/09/1981  Phone: [phone] (home)     All above HIPAA information was verified with patient.     Completed refill call assessment today to schedule patient's medication shipment from the [facility] [id number]).       Specialty medication(s) and dose(s) confirmed: Regimen is correct and unchanged.   Changes to medications: Kimberly [name] reports no changes reported at this time.  Changes to insurance: No  Questions for the pharmacist: No    Confirmed patient received Welcome Packet with first shipment. The patient will receive a drug information handout for each medication shipped and additional FDA Medication Guides as required.       DISEASE/MEDICATION-SPECIFIC INFORMATION        N/A    SPECIALTY MEDICATION ADHERENCE     Medication Adherence    Patient reported X missed doses in the last month:  0  Specialty Medication:  Calcitriol 0.25mcg  Patient is on additional specialty medications:  Yes  Additional Specialty Medications:  Myfortic 180mg   Patient Reported Additional Medication X Missed Doses in the Last Month:  0  Patient is on more than two specialty medications:  Yes  Specialty Medication:  Prograf 1mg    Patient Reported Additional Medication X Missed Doses in the Last Month:  0  Specialty Medication:  Valganciclovir 450mg   Patient Reported Additional Medication X Missed Doses in the Last Month:  0        Myfortic 180 mg: 9 days of medicine on hand   Prograf 1 mg: 9 days of medicine on hand   Prednisone 5 mg: 9 days of medicine on hand   Valganciclovir 450 mg: 9 days of medicine on hand     SHIPPING     Shipping address confirmed in Epic.     Delivery Scheduled: Yes, Expected medication delivery date: 02/26/2019.     Medication will be delivered via UPS to the home address in [facility] [place].    Jazyiah Yiu P Allena Katz   [facility] Specialty Technician

## 2019-02-18 ENCOUNTER — Ambulatory Visit: Admit: 2019-02-18 | Discharge: 2019-02-19 | Payer: MEDICARE

## 2019-02-18 DIAGNOSIS — Z940 Kidney transplant status: Principal | ICD-10-CM

## 2019-02-18 LAB — CBC W/ AUTO DIFF
BASOPHILS ABSOLUTE COUNT: 0.1 10*9/L (ref 0.0–0.1)
BASOPHILS RELATIVE PERCENT: 1.1 %
EOSINOPHILS ABSOLUTE COUNT: 0.2 10*9/L (ref 0.0–0.4)
EOSINOPHILS RELATIVE PERCENT: 2 %
HEMATOCRIT: 26.7 % — ABNORMAL LOW (ref 36.0–46.0)
LARGE UNSTAINED CELLS: 1 % (ref 0–4)
LYMPHOCYTES ABSOLUTE COUNT: 0.1 10*9/L — ABNORMAL LOW (ref 1.5–5.0)
LYMPHOCYTES RELATIVE PERCENT: 1.5 %
MEAN CORPUSCULAR HEMOGLOBIN CONC: 33 g/dL (ref 31.0–37.0)
MEAN CORPUSCULAR HEMOGLOBIN: 31.2 pg (ref 26.0–34.0)
MEAN PLATELET VOLUME: 6.7 fL — ABNORMAL LOW (ref 7.0–10.0)
MONOCYTES ABSOLUTE COUNT: 0.2 10*9/L (ref 0.2–0.8)
MONOCYTES RELATIVE PERCENT: 2.6 %
NEUTROPHILS ABSOLUTE COUNT: 6.7 10*9/L (ref 2.0–7.5)
NEUTROPHILS RELATIVE PERCENT: 91.5 %
PLATELET COUNT: 446 10*9/L — ABNORMAL HIGH (ref 150–440)
RED BLOOD CELL COUNT: 2.82 10*12/L — ABNORMAL LOW (ref 4.00–5.20)
RED BLOOD CELL COUNT: 2.82 10*12/L — ABNORMAL LOW (ref 4.00–5.20)
RED CELL DISTRIBUTION WIDTH: 14.1 % (ref 12.0–15.0)
WBC ADJUSTED: 7.4 10*9/L (ref 4.5–11.0)

## 2019-02-18 LAB — COMPREHENSIVE METABOLIC PANEL
ALBUMIN: 4.3 g/dL (ref 3.5–5.0)
ALKALINE PHOSPHATASE: 67 U/L (ref 38–126)
ALT (SGPT): 14 U/L (ref <35)
ANION GAP: 17 mmol/L — ABNORMAL HIGH (ref 7–15)
ANION GAP: 17 mmol/L — ABNORMAL HIGH (ref 7–15)
AST (SGOT): 14 U/L (ref 14–38)
BILIRUBIN TOTAL: 0.3 mg/dL (ref 0.0–1.2)
BLOOD UREA NITROGEN: 62 mg/dL — ABNORMAL HIGH (ref 7–21)
BUN / CREAT RATIO: 11
CALCIUM: 10.1 mg/dL (ref 8.5–10.2)
CHLORIDE: 102 mmol/L (ref 98–107)
CO2: 23 mmol/L (ref 22.0–30.0)
EGFR CKD-EPI AA FEMALE: 10 mL/min/{1.73_m2} — ABNORMAL LOW (ref >=60)
EGFR CKD-EPI NON-AA FEMALE: 9 mL/min/{1.73_m2} — ABNORMAL LOW (ref >=60)
GLUCOSE RANDOM: 103 mg/dL (ref 65–179)
POTASSIUM: 4.1 mmol/L (ref 3.5–5.0)
PROTEIN TOTAL: 6.3 g/dL — ABNORMAL LOW (ref 6.5–8.3)
SODIUM: 142 mmol/L (ref 135–145)

## 2019-02-19 ENCOUNTER — Ambulatory Visit: Admit: 2019-02-19 | Discharge: 2019-02-20 | Payer: MEDICARE

## 2019-02-19 DIAGNOSIS — R7989 Other specified abnormal findings of blood chemistry: Principal | ICD-10-CM

## 2019-02-19 DIAGNOSIS — Z940 Kidney transplant status: Secondary | ICD-10-CM

## 2019-02-19 DIAGNOSIS — N186 End stage renal disease: Principal | ICD-10-CM

## 2019-02-19 LAB — BASIC METABOLIC PANEL
ANION GAP: 12 mmol/L (ref 7–15)
BLOOD UREA NITROGEN: 42 mg/dL — ABNORMAL HIGH (ref 7–21)
BUN / CREAT RATIO: 11
CALCIUM: 9.8 mg/dL (ref 8.5–10.2)
CREATININE: 3.83 mg/dL — ABNORMAL HIGH (ref 0.60–1.00)
EGFR CKD-EPI AA FEMALE: 16 mL/min/{1.73_m2} — ABNORMAL LOW (ref >=60)
EGFR CKD-EPI NON-AA FEMALE: 14 mL/min/{1.73_m2} — ABNORMAL LOW (ref >=60)
EGFR CKD-EPI NON-AA FEMALE: 14 mL/min/1.73m2 — ABNORMAL LOW (ref >=60–179)
GLUCOSE RANDOM: 98 mg/dL (ref 70–179)
POTASSIUM: 3.6 mmol/L (ref 3.5–5.0)
SODIUM: 136 mmol/L (ref 135–145)

## 2019-02-19 LAB — CBC
HEMATOCRIT: 27.4 % — ABNORMAL LOW (ref 36.0–46.0)
HEMOGLOBIN: 9 g/dL — ABNORMAL LOW (ref 12.0–16.0)
HEMOGLOBIN: 9 g/dL — ABNORMAL LOW (ref 12.0–16.0)
MEAN CORPUSCULAR HEMOGLOBIN CONC: 32.9 g/dL (ref 31.0–37.0)
MEAN CORPUSCULAR VOLUME: 94.2 fL (ref 80.0–100.0)
MEAN PLATELET VOLUME: 8.5 fL (ref 7.0–10.0)
PLATELET COUNT: 455 10*9/L — ABNORMAL HIGH (ref 150–440)
RED BLOOD CELL COUNT: 2.91 10*12/L — ABNORMAL LOW (ref 4.00–5.20)
RED CELL DISTRIBUTION WIDTH: 14.4 % (ref 12.0–15.0)
WBC ADJUSTED: 9.9 10*9/L (ref 4.5–11.0)

## 2019-02-19 LAB — CBC W/ AUTO DIFF
BASOPHILS ABSOLUTE COUNT: 0.1 10*9/L (ref 0.0–0.1)
BASOPHILS RELATIVE PERCENT: 0.7 %
EOSINOPHILS ABSOLUTE COUNT: 0.1 10*9/L (ref 0.0–0.4)
EOSINOPHILS RELATIVE PERCENT: 1.3 %
HEMOGLOBIN: 9 g/dL — ABNORMAL LOW (ref 12.0–16.0)
LARGE UNSTAINED CELLS: 1 % (ref 0–4)
LYMPHOCYTES RELATIVE PERCENT: 0.6 %
MEAN CORPUSCULAR HEMOGLOBIN CONC: 34.5 g/dL (ref 31.0–37.0)
MEAN CORPUSCULAR HEMOGLOBIN: 32.4 pg (ref 26.0–34.0)
MEAN CORPUSCULAR VOLUME: 93.8 fL (ref 80.0–100.0)
MEAN PLATELET VOLUME: 7.4 fL (ref 7.0–10.0)
MONOCYTES ABSOLUTE COUNT: 0.3 10*9/L (ref 0.2–0.8)
MONOCYTES RELATIVE PERCENT: 3 %
NEUTROPHILS ABSOLUTE COUNT: 9.7 10*9/L — ABNORMAL HIGH (ref 2.0–7.5)
NEUTROPHILS RELATIVE PERCENT: 93.8 %
NEUTROPHILS RELATIVE PERCENT: 93.8 % — ABNORMAL LOW (ref 31.0–37.0)
PLATELET COUNT: 446 10*9/L — ABNORMAL HIGH (ref 150–440)
RED BLOOD CELL COUNT: 2.79 10*12/L — ABNORMAL LOW (ref 4.00–5.20)
RED CELL DISTRIBUTION WIDTH: 13.7 % (ref 12.0–15.0)
WBC ADJUSTED: 10.4 10*9/L (ref 4.5–11.0)

## 2019-02-19 LAB — APTT: APTT: 30.2 s (ref 25.9–39.5)

## 2019-02-19 NOTE — Unmapped (Signed)
Second pass

## 2019-02-19 NOTE — Unmapped (Signed)
Assessment/Plan:    Kimberly [name] is a 38 y.o. female who will undergo Ultrasound-guided transplant kidney biopsy    1. Indications and risks/benefits of procedure reviewed with patient.    2. Consent signed and present on patient's charge.   3. No cardiopulmonary or other medical contraindications present therefore will proceed with biopsy.       CC: transplant renal biopsy    HPI: Kimberly [name] is a 38 y.o. female who will undergo Ultrasound-guided transplant kidney biopsy with moderate sedation. Has been in her usual state of health. Last dose of aspirin was last Thursday as per patient. Did dialysis yesteryda. Has not taken midodrine today and BP 125/81. Denies fever, chills, dysuria, hematuria, pain over allograft site, chest pain, SOB, dizziness, flank pain.    Allergies:   Allergies   Allergen Reactions   ??? Ancef [Cefazolin] Shortness Of Breath   ??? Adhesive Tape-Silicones      Other reaction(s): Other (See Comments)  Uncoded Allergy. Allergen: plastic tape, Other Reaction: blistering   ??? Demerol [Meperidine] Nausea And Vomiting       Medications:   Current Outpatient Medications   Medication Sig Dispense Refill   ??? acetaminophen (TYLENOL) 500 MG tablet Take 2 tablets (1,000 mg total) by mouth every six (6) hours as needed for pain. 100 tablet 0   ??? aspirin (ECOTRIN) 81 MG tablet Take 1 tablet (81 mg total) by mouth daily. 30 tablet 11   ??? calcitrioL (ROCALTROL) 0.25 MCG capsule Take 1 capsule (0.25 mcg total) by mouth daily. 30 capsule 11   ??? carboxymethylcellulose sodium (REFRESH CELLUVISC) 1 % DpGe Instill drops in affected eye(s) as directed as needed.     ??? midodrine (PROAMATINE) 5 MG tablet Take 3 tablets (15 mg total) by mouth Three (3) times a day. 270 tablet 11   ??? MYFORTIC 180 mg EC tablet Take 3 tablets (540 mg total) by mouth Two (2) times a day. 180 tablet 11   ??? omeprazole (PRILOSEC) 40 MG capsule Take 1 capsule (40 mg total) by mouth daily. 30 capsule 11   ??? predniSONE (DELTASONE) 5 MG tablet Take 1 tablet (5 mg total) by mouth daily. 30 tablet 11   ??? PROGRAF 1 mg capsule Take 3 capsules (3 mg total) by mouth in AM AND 2 capsules (2 mg total) nightly. 150 capsule 11   ??? sulfamethoxazole-trimethoprim (BACTRIM) 400-80 mg per tablet Take 1 tablet (80 mg of trimethoprim total) by mouth 3 (three) times a week. 12 tablet 5   ??? valGANciclovir (VALCYTE) 450 mg tablet Take 1 tablet by mouth on Mondays and Thursdays 8 tablet 2     No current facility-administered medications for this encounter.        PMH:   Past Medical History:   Diagnosis Date   ??? Bell's palsy    ??? Disease of thyroid gland    ??? ESRD (end stage renal disease) (CMS-HCC)    ??? Nonarteritic ischemic optic neuropathy    ??? Reflux nephropathy        ASA Grade: ASA 2 - Patient with mild systemic disease with no functional limitations    ROS:  General: Denies fever or chills.  Cardiovascular: Denies chest pain.   Pulmonary: Denies shortness of breath, snoring, sleep apnea, or respiratory infection.    Allergies:   Allergies   Allergen Reactions   ??? Ancef [Cefazolin] Shortness Of Breath   ??? Adhesive Tape-Silicones      Other reaction(s): Other (See Comments)  Uncoded  Allergy. Allergen: plastic tape, Other Reaction: blistering   ??? Demerol [Meperidine] Nausea And Vomiting           PE:    Vitals:    02/19/19 0828   BP: 125/81   Pulse: 83   Resp: 16   Temp: 36.8 ??C   SpO2: 100%     General:  female in NAD.  Airway assessment: Class 1 - Can visualize soft palate, fauces, uvula, and tonsillar pillars  Cardiovascular:  Regular rate and rhythm.  No murmurs, gallops or rubs.   Lungs: respirations nonlabored; clear to auscultation bilaterally

## 2019-02-19 NOTE — Unmapped (Addendum)
Pt in room, placed on monitor.

## 2019-02-19 NOTE — Unmapped (Signed)
Lidocaine admin

## 2019-02-19 NOTE — Unmapped (Signed)
US Renal Transplant Biopsy Note  Date of Scheduled Biopsy: 02/19/2019  Rush?: Yes  Patient Name: Kimberly [name]  MR: [id number]  Age: 38 y.o.  Gender: Female  Race: Caucasian  Procedures: Ultrasound Guided Percutaneous Transplant Kidney Biopsy under Moderate Sedation  Tissue Submitted: Kidney  Special Studies Required: LM, IF, EM  ----------------------------------------------------------------------------------------------------------------------  Date of allograft implantation: 12/05/2018  Underlying native kidney disease: reflux nephropathy  Was the diagnosis established by biopsy? no   Previous transplant biopsies? yes   If yes, what were the previous diagnoses? 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 She had been on home hemodialysis subsequently. She received a deceased donor kidney transplant on 12/05/2018, but has had delayed graft function since then. Notably she has chronic hypotension on midodrine and was significant hypotensive post-op, requiring dopamine for about 24 hours and now remains on midodrine 15mg  tid. 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 She remains on home hemodialysis with persistently elevated creatinine and low urine output.  ----------------------------------------------------------------------------------------------------------------------  Current Baseline Immunosuppression: tacrolimus and myfortic  Specific anti-rejection treatment before biopsy: no   If yes, what was the type of treatment?   Patient off immunosuppression?: no   Patient seems compliant? yes   Patient is currently back on hemodialysis? yes ----------------------------------------------------------------------------------------------------------------------  Evidence of allo-antibodies? no   If yes, HLA type/MFI?:  Blood Pressure (mmHg):   BP Readings from Last 3 Encounters:   01/22/19 108/74   01/10/19 113/77   01/02/19 100/64     Urinalysis:   Lab Results   Component Value Date    Color, UA Yellow 12/21/2018    Specific Gravity, UA 1.020 12/21/2018    pH, UA 5.0 12/21/2018    Glucose, UA Negative 12/21/2018    Ketones, UA Trace (A) 12/21/2018    Blood, UA Large (A) 12/21/2018    Nitrite, UA Negative 12/21/2018    Leukocyte Esterase, UA Moderate (A) 12/21/2018    Urobilinogen, UA 0.2 mg/dL 81/19/1478    Bilirubin, UA Moderate (A) 12/21/2018     Urine protein/creatinine ratio: 0.4 (12/17/2018)  Creatinine (present): 5.66 (4/27)   Lab Results   Component Value Date    CREATININE 5.66 (H) 02/18/2019    CREATININE 4.09 (H) 02/15/2019    CREATININE 5.35 (H) 02/13/2019    CREATININE 6.07 (H) 02/11/2019    CREATININE 4.63 (H) 02/08/2019     ----------------------------------------------------------------------------------------------------------------------  Clinical signs of infections at time of current biopsy:  Hepatitis B: no   Hepatitis C: no     ----------------------------------------------------------------------------------------------------------------------  Stenosis of renal artery: no   Obstruction of ureter: no   Lymphocele: no   ----------------------------------------------------------------------------------------------------------------------  DONOR INFORMATION:   KDPI:  39%  Cr: initial: 1.3  peak:  2.0  current: 1.7  terminal: unknown  CMV+, EBV+, all other serologies negative    4210 SURGICAL PATHOLOGY REQUEST FORM  DATE      CHC Test#  CPT Code Description   4250  88331  FROZEN SECTION - SINGLE   4251  88332  FROZEN SECTIO - ADDITIONAL         4227  88300  GROSS ONLY (NO SECTION)   4228  29562  LEVEL II - GROSS &  MICRO EXAM   4229  [id number] LEVEL III - GROSS & MICRO EXAM   4230  88305  LEVEL IV - GROSS & MICRO EXAM   4231  88307  LEVEL V - GROSS & MICRO EXAM   4232  88309  LEVEL VI - GROSS & MICRO EXAM   4233  88311  DECALCIFICATION   4234  88321  CONSULT ON REFERRED SLIDES   4235  88323  CONSULT WITH SLIDE PREP   4236  88329  CONSULT DURING SURGERY   4237  [phone]  BONE MARROW ASPIRATION     DEPT. USE ONLY     CODE QTY MISCELLANEOUS (SPECIFY) AMOUNT

## 2019-02-19 NOTE — Unmapped (Signed)
US Renal Transplant Biopsy Note  Date of Scheduled Biopsy: 02/19/2019  Rush?: Yes  Patient Name: Kimberly [name]  MR: [id number]  Age: 38 y.o.  Gender: Female  Race: Caucasian  Procedures: Ultrasound Guided Percutaneous Transplant Kidney Biopsy under Moderate Sedation  Tissue Submitted: Kidney  Special Studies Required: LM, IF, EM    Please call Dr. Nestor Lewandowsky (phone [phone], pager [phone] with preliminary results (or on-call attending if not reachable).  ----------------------------------------------------------------------------------------------------------------------  Date of allograft implantation: 12/05/2018  Underlying native kidney disease: reflux nephropathy  Was the diagnosis established by biopsy? no   Previous transplant biopsies? yes   If yes, what were the previous diagnoses? 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 She had been on home hemodialysis subsequently. She received a deceased donor kidney transplant on 12/05/2018, but has had delayed graft function since then. Notably she has chronic hypotension on midodrine and was significant hypotensive post-op, requiring dopamine for about 24 hours and now remains on midodrine 15mg  tid. 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 She remains on home hemodialysis with persistently elevated creatinine and low urine output.  ----------------------------------------------------------------------------------------------------------------------  Current Baseline Immunosuppression: tacrolimus and myfortic  Specific anti-rejection treatment before biopsy: no   If yes, what was the type of treatment?   Patient off immunosuppression?: no   Patient seems compliant? yes   Patient is currently back on hemodialysis? yes   ----------------------------------------------------------------------------------------------------------------------  Evidence of allo-antibodies? no   If yes, HLA type/MFI?:  Blood Pressure (mmHg):   BP Readings from Last 3 Encounters:   01/22/19 108/74   01/10/19 113/77   01/02/19 100/64   ??  Urinalysis:         Lab Results   Component Value Date   ?? Color, UA Yellow 12/21/2018   ?? Specific Gravity, UA 1.020 12/21/2018   ?? pH, UA 5.0 12/21/2018   ?? Glucose, UA Negative 12/21/2018   ?? Ketones, UA Trace (A) 12/21/2018   ?? Blood, UA Large (A) 12/21/2018   ?? Nitrite, UA Negative 12/21/2018   ?? Leukocyte Esterase, UA Moderate (A) 12/21/2018   ?? Urobilinogen, UA 0.2 mg/dL 65/78/4696   ?? Bilirubin, UA Moderate (A) 12/21/2018   ??  Urine protein/creatinine ratio: 0.4 (12/17/2018)  Creatinine (present): 5.66 (4/27)         Lab Results   Component Value Date   ?? CREATININE 5.66 (H) 02/18/2019   ?? CREATININE 4.09 (H) 02/15/2019   ?? CREATININE 5.35 (H) 02/13/2019   ?? CREATININE 6.07 (H) 02/11/2019   ?? CREATININE 4.63 (H) 02/08/2019   ??  ----------------------------------------------------------------------------------------------------------------------  Clinical signs of infections at time of current biopsy:  Hepatitis B: no   Hepatitis C: no   ??  ----------------------------------------------------------------------------------------------------------------------  Stenosis of renal artery: no   Obstruction of ureter: no   Lymphocele: no   ----------------------------------------------------------------------------------------------------------------------  DONOR INFORMATION:??  KDPI: ??39%  Cr: initial: 1.3 ??peak: ??2.0 ??current: 1.7 ??terminal: unknown  CMV+, EBV+, all other serologies negative  ??  4210 SURGICAL PATHOLOGY REQUEST FORM  DATE ??   ??  CHC Test# ?? CPT Code Description   4250 ?? 88331  FROZEN SECTION - SINGLE 4251 ?? [id number]  FROZEN SECTIO - ADDITIONAL   ?? ?? ?? ??  4227 ?? 88300  GROSS ONLY (NO SECTION)   4228 ?? [id number]  LEVEL II - GROSS & MICRO EXAM   4229 ?? 88304  LEVEL III - GROSS & MICRO EXAM   4230 ?? 88305  LEVEL IV - GROSS & MICRO EXAM   4231 ?? 88307  LEVEL V - GROSS & MICRO EXAM   4232 ?? 88309  LEVEL VI - GROSS & MICRO EXAM   4233 ?? 88311  DECALCIFICATION   4234 ?? 88321  CONSULT ON REFERRED SLIDES   4235 ?? 88323  CONSULT WITH SLIDE PREP   4236 ?? 88329  CONSULT DURING SURGERY   4237 ?? 85109  BONE MARROW ASPIRATION   ??  DEPT. USE ONLY   ??  CODE QTY MISCELLANEOUS (SPECIFY) AMOUNT

## 2019-02-19 NOTE — Unmapped (Signed)
surg path arrived

## 2019-02-19 NOTE — Unmapped (Signed)
KIDNEY BIOPSY PROCEDURE NOTE    INDICATIONS:  Assess for rejection     CONSENT/TIME OUT:    Risks, benefits and alternatives including blood loss requiring transfusion, loss of kidney or kidney function, and death were discussed with patient.  Written informed consent was obtained prior to the procedure and is detailed in the medical record.  Prior to the start of the procedure, a time out was taken and the identity of the patient was confirmed via name, medical record number and date of birth.  The availability of the correct equipment was verified.    PROCEDURE:  Name: Transplant Kidney Biopsy  Description: Ultrasound guided native kidney biopsy  and Ultrasound guided transplant kidney biopsy     Pre-Procedure blood specimens were sent for CBC, PT/aPTT, and type & screen.     The patient was given 1 mg of midazolam and 25 mcg of fentanyl during the procedure, and 7 mL lidocaine 1% were used for local anesthesia. Under ultrasound guidance, a 16cm 16G biopsy needle was inserted into the transplanted kidney for a total of 2 passes with 2 core specimens obtained for analysis.      COMPLICATIONS:   Complications: None; no hematoma or active bleeding visualized    The patient will be closely watched in the recovery area, kept flat for 6 hours while wearing an abdominal binder, and will have a repeat CBC and ultrasound in 4 hours to insure no hemodynamically significant bleeding post-biopsy.    SPECIMEN(S):  Core #1: 1.5 x 0.1 x 0.1 (EM 0.2, LM 1.3)  Core #2: 1.7 x 0.1 x 0.1 (IF 0.3, LM 1.4)

## 2019-02-19 NOTE — Unmapped (Signed)
Post Kidney Biopsy Instructions    You have had a kidney biopsy to help in the diagnosis of your kidney disease.    Your biopsy results will take several days to complete.  You will receive your biopsy results from your primary care nephrologists.  If you have questions or concerns related to the biopsy, you can call the [facility] hospital operator at [phone] and ask them to page the renal fellow on call.  Please follow the instructions below:  ??? If you received any pain and/or anxiety medications for you biopsy, you should not drive or operate heavy equipment for 24 hrs.  ??? Avoid heavy physical activity for the next 3 weeks.  ??? No signing of legal documents on the day of the procedure.  ??? No drinking of any alcoholic beverage on the day of the procedure.  ??? Do not drive or operate heavy machinery on the day of the procedure.  ??? No heavy lifting greater than 15 pounds or exertion for 3 weeks.  ??? No NSAIDSs (ibuprofen, Motrin, Aleve) or aspirin for the next 7 days unless your doctor has instructed you differently.  ??? Keep your dressing clean, dry and in place for 2 days.  After 2 days, you may shower.  After showering, remove the dressing and wash the area with warm, soapy water.  Re-apply a dressing and repeat this process until the biopsy site is healed.  Do not take a bath or get into a hot tub or go swimming until site is healed.  ??? If you have blood in your urine which does not seem to be getting less each time you urinate, have difficulty urinating, increasing back pain, or if you feel faint or dizzy, you should call the [facility] hospital operator at [phone] and ask them to page the renal fellow on call.  If you have any severe or sudden change in the way you feel, you should go to the nearest ER or call 911.

## 2019-02-19 NOTE — Unmapped (Signed)
First pass

## 2019-02-20 ENCOUNTER — Ambulatory Visit: Admit: 2019-02-20 | Discharge: 2019-02-21 | Payer: MEDICARE

## 2019-02-20 DIAGNOSIS — Z940 Kidney transplant status: Principal | ICD-10-CM

## 2019-02-20 LAB — CBC W/ AUTO DIFF
BASOPHILS ABSOLUTE COUNT: 0.1 10*9/L (ref 0.0–0.1)
BASOPHILS RELATIVE PERCENT: 1.3 %
EOSINOPHILS ABSOLUTE COUNT: 0.1 10*9/L (ref 0.0–0.4)
EOSINOPHILS RELATIVE PERCENT: 1.8 %
LARGE UNSTAINED CELLS: 1 % (ref 0–4)
LYMPHOCYTES ABSOLUTE COUNT: 0.1 10*9/L — ABNORMAL LOW (ref 1.5–5.0)
LYMPHOCYTES RELATIVE PERCENT: 1.4 %
MEAN CORPUSCULAR HEMOGLOBIN CONC: 32.8 g/dL (ref 31.0–37.0)
MEAN CORPUSCULAR HEMOGLOBIN: 30.8 pg (ref 26.0–34.0)
MEAN CORPUSCULAR VOLUME: 93.9 fL (ref 80.0–100.0)
MEAN PLATELET VOLUME: 6.7 fL — ABNORMAL LOW (ref 7.0–10.0)
MONOCYTES ABSOLUTE COUNT: 0.2 10*9/L (ref 0.2–0.8)
MONOCYTES RELATIVE PERCENT: 4 %
MONOCYTES RELATIVE PERCENT: 4 % — ABNORMAL LOW (ref 4.00–5.20)
NEUTROPHILS ABSOLUTE COUNT: 5.4 10*9/L (ref 2.0–7.5)
NEUTROPHILS RELATIVE PERCENT: 90.2 %
RED BLOOD CELL COUNT: 2.94 10*12/L — ABNORMAL LOW (ref 4.00–5.20)
RED CELL DISTRIBUTION WIDTH: 14.1 % (ref 12.0–15.0)
WBC ADJUSTED: 6 10*9/L (ref 4.5–11.0)

## 2019-02-20 LAB — COMPREHENSIVE METABOLIC PANEL
ALBUMIN: 4.3 g/dL (ref 3.5–5.0)
ALKALINE PHOSPHATASE: 69 U/L (ref 38–126)
ALT (SGPT): 14 U/L (ref <35)
ANION GAP: 15 mmol/L (ref 7–15)
AST (SGOT): 14 U/L (ref 14–38)
AST (SGOT): 14 U/L — ABNORMAL LOW (ref 14–38)
BILIRUBIN TOTAL: 0.4 mg/dL (ref 0.0–1.2)
BLOOD UREA NITROGEN: 49 mg/dL — ABNORMAL HIGH (ref 7–21)
BUN / CREAT RATIO: 11
CALCIUM: 10.2 mg/dL (ref 8.5–10.2)
CHLORIDE: 98 mmol/L (ref 98–107)
CO2: 28 mmol/L (ref 22.0–30.0)
CREATININE: 4.63 mg/dL — ABNORMAL HIGH (ref 0.60–1.00)
EGFR CKD-EPI AA FEMALE: 13 mL/min/{1.73_m2} — ABNORMAL LOW (ref >=60)
EGFR CKD-EPI NON-AA FEMALE: 11 mL/min/{1.73_m2} — ABNORMAL LOW (ref >=60)
GLUCOSE RANDOM: 98 mg/dL (ref 70–179)
POTASSIUM: 4.4 mmol/L (ref 3.5–5.0)
PROTEIN TOTAL: 6.4 g/dL — ABNORMAL LOW (ref 6.5–8.3)
SODIUM: 141 mmol/L (ref 135–145)

## 2019-02-20 NOTE — Unmapped (Signed)
Received call with preliminary renal biopsy results from primary transplant nephrologist.     No evidence of rejection. 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     Hb stable and follow-up ultrasound negative for bleeding.    OK for discharge home.     Armandina Stammer, MD   Nephrology Fellow

## 2019-02-22 ENCOUNTER — Ambulatory Visit: Admit: 2019-02-22 | Discharge: 2019-02-23 | Payer: MEDICARE

## 2019-02-22 DIAGNOSIS — N186 End stage renal disease: Secondary | ICD-10-CM

## 2019-02-22 DIAGNOSIS — D631 Anemia in chronic kidney disease: Secondary | ICD-10-CM

## 2019-02-22 DIAGNOSIS — Z992 Dependence on renal dialysis: Secondary | ICD-10-CM

## 2019-02-22 DIAGNOSIS — Z940 Kidney transplant status: Principal | ICD-10-CM

## 2019-02-22 LAB — CBC W/ AUTO DIFF
BASOPHILS ABSOLUTE COUNT: 0.1 10*9/L (ref 0.0–0.1)
BASOPHILS RELATIVE PERCENT: 1.1 %
EOSINOPHILS ABSOLUTE COUNT: 0.2 10*9/L (ref 0.0–0.4)
EOSINOPHILS RELATIVE PERCENT: 2.1 %
HEMATOCRIT: 26.2 % — ABNORMAL LOW (ref 36.0–46.0)
HEMOGLOBIN: 8.7 g/dL — ABNORMAL LOW (ref 13.5–16.0)
LARGE UNSTAINED CELLS: 1 % (ref 0–4)
LYMPHOCYTES ABSOLUTE COUNT: 0.1 10*9/L — ABNORMAL LOW (ref 1.5–5.0)
LYMPHOCYTES RELATIVE PERCENT: 1.3 %
MEAN CORPUSCULAR HEMOGLOBIN: 30.9 pg (ref 26.0–34.0)
MEAN CORPUSCULAR VOLUME: 93.1 fL (ref 80.0–100.0)
MEAN PLATELET VOLUME: 6.6 fL — ABNORMAL LOW (ref 7.0–10.0)
MONOCYTES ABSOLUTE COUNT: 0.3 10*9/L (ref 0.2–0.8)
MONOCYTES RELATIVE PERCENT: 3.6 %
NEUTROPHILS ABSOLUTE COUNT: 6.4 10*9/L (ref 2.0–7.5)
PLATELET COUNT: 398 10*9/L (ref 150–440)
RED BLOOD CELL COUNT: 2.82 10*12/L — ABNORMAL LOW (ref 4.00–5.20)
RED CELL DISTRIBUTION WIDTH: 14 % (ref 12.0–15.0)
WBC ADJUSTED: 7 10*9/L (ref 4.5–11.0)

## 2019-02-22 LAB — COMPREHENSIVE METABOLIC PANEL
ALBUMIN: 4.4 g/dL (ref 3.5–5.0)
ALKALINE PHOSPHATASE: 85 U/L (ref 38–126)
ALT (SGPT): 13 U/L (ref <35)
ANION GAP: 14 mmol/L (ref 7–15)
AST (SGOT): 15 U/L (ref 14–38)
BILIRUBIN TOTAL: 0.3 mg/dL (ref 0.0–1.2)
BUN / CREAT RATIO: 11
CHLORIDE: 96 mmol/L — ABNORMAL LOW (ref 98–107)
CO2: 30 mmol/L (ref 22.0–30.0)
CREATININE: 3.73 mg/dL — ABNORMAL HIGH (ref 0.60–1.00)
EGFR CKD-EPI AA FEMALE: 17 mL/min/{1.73_m2} — ABNORMAL LOW (ref >=60)
EGFR CKD-EPI NON-AA FEMALE: 15 mL/min/{1.73_m2} — ABNORMAL LOW (ref >=60)
GLUCOSE RANDOM: 102 mg/dL (ref 70–179)
POTASSIUM: 3.8 mmol/L (ref 3.5–5.0)
PROTEIN TOTAL: 6.5 g/dL (ref 6.5–8.3)
SODIUM: 140 mmol/L (ref 135–145)

## 2019-02-22 LAB — PROTEIN TOTAL: Protein:MCnc:Pt:Ser/Plas:Qn:: 6.5

## 2019-02-22 LAB — IRON: Iron:MCnc:Pt:Ser/Plas:Qn:: 35

## 2019-02-22 LAB — IRON & TIBC
IRON SATURATION (CALC): 9 % — ABNORMAL LOW (ref 15–50)
TOTAL IRON BINDING CAPACITY (CALC): 373.2 mg/dL (ref 252.0–479.0)
TRANSFERRIN: 296.2 mg/dL (ref 200.0–380.0)

## 2019-02-22 LAB — MAGNESIUM: Magnesium:MCnc:Pt:Ser/Plas:Qn:: 1.6

## 2019-02-22 LAB — TACROLIMUS BLOOD: Lab: 6.6

## 2019-02-22 LAB — LYMPHOCYTES ABSOLUTE COUNT: Lab: 0.1 — ABNORMAL LOW

## 2019-02-25 ENCOUNTER — Ambulatory Visit: Admit: 2019-02-25 | Discharge: 2019-02-26 | Payer: MEDICARE

## 2019-02-25 DIAGNOSIS — D631 Anemia in chronic kidney disease: Secondary | ICD-10-CM

## 2019-02-25 DIAGNOSIS — Z940 Kidney transplant status: Principal | ICD-10-CM

## 2019-02-25 DIAGNOSIS — N186 End stage renal disease: Secondary | ICD-10-CM

## 2019-02-25 DIAGNOSIS — Z992 Dependence on renal dialysis: Secondary | ICD-10-CM

## 2019-02-25 LAB — CBC W/ AUTO DIFF
BASOPHILS ABSOLUTE COUNT: 0.1 10*9/L (ref 0.0–0.1)
BASOPHILS RELATIVE PERCENT: 1.1 %
EOSINOPHILS ABSOLUTE COUNT: 0.1 10*9/L (ref 0.0–0.4)
EOSINOPHILS RELATIVE PERCENT: 1.5 %
HEMOGLOBIN: 8.7 g/dL — ABNORMAL LOW (ref 13.5–16.0)
LARGE UNSTAINED CELLS: 1 % (ref 0–4)
LYMPHOCYTES ABSOLUTE COUNT: 0.1 10*9/L — ABNORMAL LOW (ref 1.5–5.0)
LYMPHOCYTES RELATIVE PERCENT: 1.2 %
MEAN CORPUSCULAR HEMOGLOBIN CONC: 33 g/dL (ref 31.0–37.0)
MEAN CORPUSCULAR VOLUME: 94.1 fL (ref 80.0–100.0)
MEAN PLATELET VOLUME: 6.9 fL — ABNORMAL LOW (ref 7.0–10.0)
MONOCYTES ABSOLUTE COUNT: 0.4 10*9/L (ref 0.2–0.8)
MONOCYTES RELATIVE PERCENT: 6 %
NEUTROPHILS ABSOLUTE COUNT: 6.2 10*9/L (ref 2.0–7.5)
NEUTROPHILS RELATIVE PERCENT: 88.7 %
PLATELET COUNT: 402 10*9/L (ref 150–440)
RED BLOOD CELL COUNT: 2.79 10*12/L — ABNORMAL LOW (ref 4.00–5.20)
RED CELL DISTRIBUTION WIDTH: 13.8 % (ref 12.0–15.0)
WBC ADJUSTED: 6.9 10*9/L (ref 4.5–11.0)

## 2019-02-25 LAB — COMPREHENSIVE METABOLIC PANEL
ALKALINE PHOSPHATASE: 72 U/L (ref 38–126)
ALT (SGPT): 22 U/L (ref <35)
ANION GAP: 14 mmol/L (ref 7–15)
AST (SGOT): 20 U/L (ref 14–38)
BILIRUBIN TOTAL: 0.2 mg/dL (ref 0.0–1.2)
BLOOD UREA NITROGEN: 63 mg/dL — ABNORMAL HIGH (ref 7–21)
BUN / CREAT RATIO: 12
CALCIUM: 10.1 mg/dL (ref 8.5–10.2)
CHLORIDE: 102 mmol/L (ref 98–107)
CO2: 25 mmol/L (ref 22.0–30.0)
CREATININE: 5.25 mg/dL — ABNORMAL HIGH (ref 0.60–1.00)
EGFR CKD-EPI AA FEMALE: 11 mL/min/{1.73_m2} — ABNORMAL LOW (ref >=60)
EGFR CKD-EPI NON-AA FEMALE: 10 mL/min/{1.73_m2} — ABNORMAL LOW (ref >=60)
GLUCOSE RANDOM: 106 mg/dL (ref 70–179)
POTASSIUM: 4.6 mmol/L (ref 3.5–5.0)
PROTEIN TOTAL: 6.1 g/dL — ABNORMAL LOW (ref 6.5–8.3)

## 2019-02-25 LAB — MONOCYTES ABSOLUTE COUNT: Lab: 0.4

## 2019-02-25 LAB — TACROLIMUS BLOOD: Lab: 5.4

## 2019-02-25 LAB — MAGNESIUM: Magnesium:MCnc:Pt:Ser/Plas:Qn:: 1.6

## 2019-02-25 LAB — EGFR CKD-EPI NON-AA FEMALE: Lab: 10 — ABNORMAL LOW

## 2019-02-25 LAB — FERRITIN: Ferritin:MCnc:Pt:Ser/Plas:Qn:: 34.1

## 2019-02-25 MED FILL — ASPIRIN 81 MG TABLET,DELAYED RELEASE: ORAL | 30 days supply | Qty: 30 | Fill #2

## 2019-02-25 MED FILL — MYFORTIC 180 MG TABLET,DELAYED RELEASE: ORAL | 30 days supply | Qty: 180 | Fill #2

## 2019-02-25 MED FILL — MIDODRINE 5 MG TABLET: 30 days supply | Qty: 270 | Fill #2 | Status: AC

## 2019-02-25 MED FILL — PREDNISONE 5 MG TABLET: ORAL | 30 days supply | Qty: 30 | Fill #1

## 2019-02-25 MED FILL — VALGANCICLOVIR 450 MG TABLET: 28 days supply | Qty: 8 | Fill #1 | Status: AC

## 2019-02-25 MED FILL — OMEPRAZOLE 40 MG CAPSULE,DELAYED RELEASE: ORAL | 30 days supply | Qty: 30 | Fill #2

## 2019-02-25 MED FILL — SULFAMETHOXAZOLE 400 MG-TRIMETHOPRIM 80 MG TABLET: ORAL | 28 days supply | Qty: 12 | Fill #2

## 2019-02-25 MED FILL — PROGRAF 1 MG CAPSULE: 30 days supply | Qty: 150 | Fill #1 | Status: AC

## 2019-02-25 MED FILL — OMEPRAZOLE 40 MG CAPSULE,DELAYED RELEASE: 30 days supply | Qty: 30 | Fill #2 | Status: AC

## 2019-02-25 MED FILL — SULFAMETHOXAZOLE 400 MG-TRIMETHOPRIM 80 MG TABLET: 28 days supply | Qty: 12 | Fill #2 | Status: AC

## 2019-02-25 MED FILL — VALGANCICLOVIR 450 MG TABLET: 28 days supply | Qty: 8 | Fill #1

## 2019-02-25 MED FILL — CALCITRIOL 0.25 MCG CAPSULE: ORAL | 30 days supply | Qty: 30 | Fill #2

## 2019-02-25 MED FILL — ASPIRIN 81 MG TABLET,DELAYED RELEASE: 30 days supply | Qty: 30 | Fill #2 | Status: AC

## 2019-02-25 MED FILL — CALCITRIOL 0.25 MCG CAPSULE: 30 days supply | Qty: 30 | Fill #2 | Status: AC

## 2019-02-25 MED FILL — MIDODRINE 5 MG TABLET: ORAL | 30 days supply | Qty: 270 | Fill #2

## 2019-02-25 MED FILL — MYFORTIC 180 MG TABLET,DELAYED RELEASE: 30 days supply | Qty: 180 | Fill #2 | Status: AC

## 2019-02-25 MED FILL — PROGRAF 1 MG CAPSULE: 30 days supply | Qty: 150 | Fill #1

## 2019-02-25 MED FILL — PREDNISONE 5 MG TABLET: 30 days supply | Qty: 30 | Fill #1 | Status: AC

## 2019-02-26 NOTE — Unmapped (Signed)
Kimberly [name] TRANSPLANT TELEPHONE PHARMACY NOTE  02/26/2019   Kimberly [name]  [id number]    Education/Adherence tools provided today:  1.  provided additional education on immunosuppression and transplant related medications including reviewing indications of medications, dosing and side effects    Follow up items:  1. goal of understanding indications and dosing of immunosuppression medications  2. If consider transitioning to belatacept    Next visit with pharmacy in 1 month  ____________________________________________________________________    Kimberly [name] is a 38 y.o. female s/p deceased kidney transplant on 2018/12/27 (Kidney) 2/2 reflux nephropathy.  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  3rd transplant failed in 2007.    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    Called by pharmacy today for: medication management and adherence education; last seen by pharmacy 1 months ago. Today's visit was conducted via telephone due to the COVID-19 pandemic    CC:  Patient has no complaints today     There were no vitals filed for this visit.    Allergies   Allergen Reactions   ??? Ancef [Cefazolin] Shortness Of Breath   ??? Adhesive Tape-Silicones      Other reaction(s): Other (See Comments)  Uncoded Allergy. Allergen: plastic tape, Other Reaction: blistering   ??? Demerol [Meperidine] Nausea And Vomiting       All medications reviewed and updated.     Medication list includes revisions made during today???s encounter    Outpatient Encounter Medications as of 02/26/2019   Medication Sig Dispense Refill   ??? aspirin (ECOTRIN) 81 MG tablet Take 1 tablet (81 mg total) by mouth daily. 30 tablet 11   ??? calcitrioL (ROCALTROL) 0.25 MCG capsule Take 1 capsule (0.25 mcg total) by mouth daily. 30 capsule 11   ??? midodrine (PROAMATINE) 5 MG tablet Take 3 tablets (15 mg total) by mouth Three (3) times a day. 270 tablet 11   ??? MYFORTIC 180 mg EC tablet Take 3 tablets (540 mg total) by mouth Two (2) times a day. 180 tablet 11   ??? omeprazole (PRILOSEC) 40 MG capsule Take 1 capsule (40 mg total) by mouth daily. 30 capsule 11   ??? predniSONE (DELTASONE) 5 MG tablet Take 1 tablet (5 mg total) by mouth daily. 30 tablet 11   ??? PROGRAF 1 mg capsule Take 3 capsules (3 mg total) by mouth in the morning and 2 capsules (2 mg total) nightly. 150 capsule 11   ??? sulfamethoxazole-trimethoprim (BACTRIM) 400-80 mg per tablet Take 1 tablet (80 mg of trimethoprim total) by mouth 3 (three) times a week. 12 tablet 5   ??? valGANciclovir (VALCYTE) 450 mg tablet Take 1 tablet by mouth on Mondays and Thursdays 8 tablet 2   ??? acetaminophen (TYLENOL) 500 MG tablet Take 2 tablets (1,000 mg total) by mouth every six (6) hours as needed for pain. 100 tablet 0   ??? carboxymethylcellulose sodium (REFRESH CELLUVISC) 1 % DpGe Instill drops in affected eye(s) as directed as needed.       No facility-administered encounter medications on file as of 02/26/2019.      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)  [id number]  mg PO bid    prednisone 5 mg daily (added 3/31 due to lower tacrolimus goal     Patient is tolerating immunosuppression well    IMMUNOSUPPRESSION DRUG LEVELS:  Lab Results   Component Value Date    Tacrolimus, Trough 6.6 12/24/2018    Tacrolimus, Trough 6.5 12/21/2018    Tacrolimus, Trough 6.5 12/17/2018    Tacrolimus, Timed 5.4 02/25/2019    Tacrolimus, Timed 6.6 02/22/2019    Tacrolimus, Timed 4.6 02/20/2019     Lab Results   Component Value Date    Cyclosporine, Trough <10 (L) 12/17/2018     No results found for: EVEROLIMUS  No results found for: SIROLIMUS    Prograf level is accurate 12 hour trough on 5/4    Graft function: complicated by DGF  DSA: ntd  Biopsies to date:   -3/2 no evidence of rejection. 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 Nestor Lewandowsky.. Continue to monitor.    ID prophylaxis:   CMV Status: D+/ R+, moderate risk . CMV prophylaxis: valganciclovir 450 mg two days per week (Mon/Thurs) x 3 months per protocol (end 03/05/19)  No results found for: CMVCP  PCP Prophylaxis: bactrim SS 1 tab MWF x 6 months (end 06/05/19)  Thrush: completed in hospital  Patient is  tolerating infectious prophylaxis well    Plan: Continue per protocol. Aware to stop Valcyte 5/12. Continue to monitor.    CV Prophylaxis: asa 81 mg   The ASCVD Risk score Denman George DC Montez Hageman, et al., 2013) failed to calculate.  Statin therapy: Not indicated due to age; currently on no statin  Plan: consider starting statin at a later date. Continue to monitor     BP: Goal < 140/90. Clinic vitals reported above  Home BP ranges:  120s/60-80s  Current meds include: midodrine 15 mg TID  Plan: within goal and improved over the last month. Continue to monitor    Anemia:  H/H:   Lab Results   Component Value Date    HGB 8.7 (L) 02/25/2019     Lab Results   Component Value Date    HCT 26.3 (L) 02/25/2019     Iron panel:  Lab Results   Component Value Date    IRON 35 02/22/2019    TIBC 373.2 02/22/2019    FERRITIN 34.1 02/25/2019     Lab Results   Component Value Date    Iron Saturation (%) 9 (L) 02/22/2019     Prior ESA use: none post transplant    Plan: out of goal and iron stores low.  Has Venofer at home but not currently using.  Will discuss starting with Dr. Nestor Lewandowsky.  Continue to monitor.     DM:   Lab Results   Component Value Date    A1C 5.1 12/05/2018   . Goal A1c < 7  History of Dm? No  Diet: did not address  Exercise:not yet  Plan: Continue to monitor    Electrolytes: wnl  Meds currently on: none  Plan: Continue to monitor     GI/BM: pt reports no diarrhea; stools are soft but formed  Meds currently on: omeprazole 40 mg daily  Plan:  Continue to monitor    Pain: pt reports no pain  Meds currently on: APAP PRN  Plan: Continue to monitor    Bone health:   Vitamin D Level: 9.5 on 12/17/18. Goal > 30.   Last DEXA results:  none available  Current meds include: calcitriol 0.25 mcg daily  Plan: Vitamin D level  out of goal,consider increasing calcitriol to 0.5 mcg daily at next visit if Ca normalizes Continue to monitor.     Dry eye  meds currently on: hypromellose drops PRN    Women's/Men's Health:  Joniece Smotherman is a 38 y.o. Female of childbearing age. Patient reports no desire for pregnancy and does not have IUD or take OC.  She's aware of pregnancy risk with Myfortic  Plan: Encouraged use of barrier method and to consider another contraceptive option. Continue to monitor    Adherence: Patient has good understanding of medications; was able to independently identify names/doses of immunosuppressants and OI meds.  Patient  does fill their own pill box on a regular basis at home.  Patient brought medication card:N/A phone visit  Pill box:N/A phone visit  Plan: provided extensive adherence counseling/intervention    Spent approximately 15 minutes on educating this patient and greater than 50% was spent in telephone counseling regarding post transplant medication education. Questions and concerns were address to patient's satisfaction.    Patient was reviewed with Dr. Nestor Lewandowsky::     During this visit, the following was completed:   BP log data assessment  Labs ordered and evaluated  complex treatment plan >1 DS   Patient education was completed for 11-24 minutes     All questions/concerns were addressed to the patient's satisfaction.  __________________________________________  Cleone Slim, PHARMD  [employer]PLANT  PAGER [phone]

## 2019-02-27 ENCOUNTER — Ambulatory Visit: Admit: 2019-02-27 | Discharge: 2019-02-28 | Payer: MEDICARE

## 2019-02-27 ENCOUNTER — Telehealth: Admit: 2019-02-27 | Discharge: 2019-02-28 | Payer: MEDICARE | Attending: Nephrology | Primary: Nephrology

## 2019-02-27 DIAGNOSIS — Z940 Kidney transplant status: Principal | ICD-10-CM

## 2019-02-27 LAB — COMPREHENSIVE METABOLIC PANEL
ALBUMIN: 4.3 g/dL (ref 3.5–5.0)
ALKALINE PHOSPHATASE: 67 U/L (ref 38–126)
ALT (SGPT): 20 U/L (ref <35)
ANION GAP: 14 mmol/L (ref 7–15)
AST (SGOT): 16 U/L (ref 14–38)
BILIRUBIN TOTAL: 0.4 mg/dL (ref 0.0–1.2)
BLOOD UREA NITROGEN: 53 mg/dL — ABNORMAL HIGH (ref 7–21)
CALCIUM: 10.2 mg/dL (ref 8.5–10.2)
CHLORIDE: 101 mmol/L (ref 98–107)
CO2: 26 mmol/L (ref 22.0–30.0)
CREATININE: 4.48 mg/dL — ABNORMAL HIGH (ref 0.60–1.00)
EGFR CKD-EPI AA FEMALE: 13 mL/min/{1.73_m2} — ABNORMAL LOW (ref >=60)
EGFR CKD-EPI NON-AA FEMALE: 12 mL/min/{1.73_m2} — ABNORMAL LOW (ref >=60)
GLUCOSE RANDOM: 100 mg/dL (ref 70–179)
POTASSIUM: 4.6 mmol/L (ref 3.5–5.0)
SODIUM: 141 mmol/L (ref 135–145)

## 2019-02-27 LAB — CBC W/ AUTO DIFF
BASOPHILS ABSOLUTE COUNT: 0.1 10*9/L (ref 0.0–0.1)
BASOPHILS RELATIVE PERCENT: 1.2 %
EOSINOPHILS RELATIVE PERCENT: 2 %
HEMOGLOBIN: 8.7 g/dL — ABNORMAL LOW (ref 13.5–16.0)
LARGE UNSTAINED CELLS: 2 % (ref 0–4)
LYMPHOCYTES ABSOLUTE COUNT: 0.1 10*9/L — ABNORMAL LOW (ref 1.5–5.0)
MEAN CORPUSCULAR HEMOGLOBIN CONC: 33.1 g/dL (ref 31.0–37.0)
MEAN CORPUSCULAR HEMOGLOBIN: 31.2 pg (ref 26.0–34.0)
MEAN CORPUSCULAR VOLUME: 94.3 fL (ref 80.0–100.0)
MEAN PLATELET VOLUME: 6.8 fL — ABNORMAL LOW (ref 7.0–10.0)
MONOCYTES ABSOLUTE COUNT: 0.3 10*9/L (ref 0.2–0.8)
MONOCYTES RELATIVE PERCENT: 5.3 %
NEUTROPHILS ABSOLUTE COUNT: 5.1 10*9/L (ref 2.0–7.5)
NEUTROPHILS RELATIVE PERCENT: 88.4 %
RED BLOOD CELL COUNT: 2.8 10*12/L — ABNORMAL LOW (ref 4.00–5.20)
RED CELL DISTRIBUTION WIDTH: 13.9 % (ref 12.0–15.0)
WBC ADJUSTED: 5.8 10*9/L (ref 4.5–11.0)

## 2019-02-27 LAB — TACROLIMUS BLOOD: Lab: 4.7

## 2019-02-27 LAB — BUN / CREAT RATIO: Urea nitrogen/Creatinine:MRto:Pt:Ser/Plas:Qn:: 12

## 2019-02-27 LAB — MAGNESIUM: Magnesium:MCnc:Pt:Ser/Plas:Qn:: 1.6

## 2019-02-27 LAB — WBC ADJUSTED: Lab: 5.8

## 2019-02-27 NOTE — Unmapped (Signed)
Reviewed medication and discuss concerns pre-telemedicine visit. Pt report occasional headache after dialysis Urine output a little more than 500 ml. Blood pressure and heart rate today  120/63 HR 49. Denies, Dizziness, Nausea/Vomitting/Diarrhea/Constipation. Denies SOB, palpitation, chest pain, edema and pain or burning when urinating.

## 2019-02-27 NOTE — Unmapped (Signed)
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  2nd kidney transplant - 2000-2002 - DD failed chronic rejection.  3rd kidney transplant - 2004 - 2007 - DD failed in 2007 chronic rejection    - Post-transplant course complicated by delayed graft function, currently on hemodialysis.  On dialysis 2 times/week Monday and Thursday with a UF of 2 liters.    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    Creatinine - value 4.48  Continues to be elevated.  Urine analysis: protein 100 mg/dl / WBC >782 / RBC <1  Urine protein/creatinine ratio: 0.746 (12/21/18)  DSA: negative - 12/24/18    Last CMV checked: not detected - 12/17/18  Last BKV checked: not checked    Prophylaxis -  Bactrim 80 mg TIW (until 06/05/19)  Valcyte 450 mg BIW (M/Th) (until 03/05/19)    2) Immunosuppression: Prograf 3/2 mg / Myfortic 540 mg BID,  Prednisone 5 mg  - Prograf level 5.4 (02/25/19)  and Last dose of Prograf: 9:00 p.m  - Goal of 12 hour Prograf trough: 4-6  - Changes in Immunosuppression - Will plan to switch patient to Belatacept considering recent biopsy results on 02/19/19. Once switched to belatecept will increase Myfortic to 720 BID and Prednisone to 10 mg.  - Medications side effects: No    3) Hypotension - Controlled - on midodrine 15 mg TID  Goal for B.P > 100   Changes in B.P medications - Reduce midodrine to 10 mg TID    4) Anemia - stable - Hgb 8.7   Goal for Hemoglobin > 11.0  Ferritin 34.1 (02/25/19)  - start iron supplement TID    5) MBD - Calcium 10.2 / Phosphorus 4.5 - Secondary hyperparathyroidism  On calcitriol 0.25 mcg daily  iPTH - 73.8 (12/05/18)    6) Electrolytes: WNL    7) Immunizations:  Influenza (inactivated only): 08/09/18  Pneumococcal vaccination (inactivated only):     8) Cancer screening:  PAP smear - 3/15 negative   Mammogram - Age 38.    Follow up: 4 weeks.    I spent 11 minutes on the audio/video with the patient. I spent an additional 15 minutes on pre- and post-visit activities.     The patient was physically located in [place] or a state in which I am permitted to provide care. The patient and/or parent/gauardian understood that s/he may incur co-pays and cost sharing, and agreed to the telemedicine visit. The visit was completed via phone and/or video, which was appropriate and reasonable under the circumstances given the patient's presentation at the time.    The patient and/or parent/guardian has been advised of the potential risks and limitations of this mode of treatment (including, but not limited to, the absence of in-person examination) and has agreed to be treated using telemedicine. The patient's/patient's family's questions regarding telemedicine have been answered.     If the phone/video visit was completed in an ambulatory setting, the  patient and/or parent/guardian has also been advised to contact their provider???s office for worsening conditions, and seek emergency medical treatment and/or call 911 if the patient deems either necessary.        History of Presenting Illness:     Patient's post transplant course has been complicated by delayed graft function, currently on hemodialysis. UOP: Mon - 460, Tue - not done, Wed - 440, Thurs- 430, Sat - 450, Sun- 460    Patient presents today doing well. Home BP is 120's/68-82. Overall feels well, denies any problems. No chest pain/tightness/SOB.  No fever/chills.     Last dose of Prograf at 9 p.m.   Diabetes: No   HTN: Yes: Hypotension    Controlled:Yes   Adherence      With Medication: yes With Follow up: yes    Functional Status: independent    Patient was found to have reflux nephropathy at age 38 and was on peritoneal dialysis briefly before receiving a living related renal transplant from mother. She had some thrombotic complication and underwent nephrectomy in 1997. She started dialysis and had a second kidney transplant at age 38 in 75. 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 She received a 3rd kidney transplant in 11/2002 which failed in 2007 due to chronic rejection.    She has decreased vision both eyes.    Review of Systems:     Fever or chills: negative   Sore throat: negative   Fatigue/malaise: negative   Weight loss or gain: negative   New skin rash/lump or bump: negative   Problems with teeth/gums: negative   Chest pain: negative   Cough or shortness of breath: negative   Swelling: negative   Belly pain/heartburn/nausea/vomiting or diarrhea: negative   Pain or bleeding when urinating: negative   Twitching/numbness or weakness: negative     Physical Exam:     Not done    Renal Transplant History:    Race:  Caucasian   Age of recipient (at time of transplant): 38 y.o.   Cause of kidney disease: Reflux nephropathy   Native biopsy: No    Date of transplant: 12/05/18   Type of transplant: KDPI - 39%    - Donor creatinine: 1.3    - Any co morbidities: No    - Infection in donor: No    - HLA Mismatch: N/A    - Ischemia time: 6h 21m    - Crossmatch: negative    - Donor kidney biopsy: No diagnostic abnormalities identified (12/05/18)     Induction: [place] - alemtuzumab   Maintenance IS at the time of transplant: tacrolimus, mycophenolate   DGF: Yes   Diabetes Onset after transplant: No    Allergies:   Allergies   Allergen Reactions   ??? Ancef [Cefazolin] Shortness Of Breath   ??? Adhesive Tape-Silicones      Other reaction(s): Other (See Comments)  Uncoded Allergy. Allergen: plastic tape, Other Reaction: blistering   ??? Demerol [Meperidine] Nausea And Vomiting        Current Medications:   Current Outpatient Medications   Medication Sig Dispense Refill   ??? acetaminophen (TYLENOL) 500 MG tablet Take 2 tablets (1,000 mg total) by mouth every six (6) hours as needed for pain. 100 tablet 0   ??? aspirin (ECOTRIN) 81 MG tablet Take 1 tablet (81 mg total) by mouth daily. 30 tablet 11   ??? calcitrioL (ROCALTROL) 0.25 MCG capsule Take 1 capsule (0.25 mcg  total) by mouth daily. 30 capsule 11   ??? carboxymethylcellulose sodium (REFRESH CELLUVISC) 1 % DpGe Instill drops in affected eye(s) as directed as needed.     ??? midodrine (PROAMATINE) 5 MG tablet Take 3 tablets (15 mg total) by mouth Three (3) times a day. 270 tablet 11   ??? MYFORTIC 180 mg EC tablet Take 3 tablets (540 mg total) by mouth Two (2) times a day. 180 tablet 11   ??? omeprazole (PRILOSEC) 40 MG capsule Take 1 capsule (40 mg total) by mouth daily. 30 capsule 11   ??? predniSONE (DELTASONE) 5 MG tablet Take 1 tablet (5 mg total) by mouth daily. 30 tablet 11   ??? PROGRAF 1 mg capsule Take 3 capsules (3 mg total) by mouth in the morning and 2 capsules (2 mg total) nightly. 150 capsule 11   ??? sulfamethoxazole-trimethoprim (BACTRIM) 400-80 mg per tablet Take 1 tablet (80 mg of trimethoprim total) by mouth 3 (three) times a week. 12 tablet 5   ??? valGANciclovir (VALCYTE) 450 mg tablet Take 1 tablet by mouth on Mondays and Thursdays 8 tablet 2     No current facility-administered medications for this visit.        Past Medical History:   Past Medical History:   Diagnosis Date   ??? Bell's palsy    ??? Disease of thyroid gland    ??? ESRD (end stage renal disease) (CMS-HCC)    ??? Nonarteritic ischemic optic neuropathy    ??? Reflux nephropathy         Laboratory studies:     Recent Results (from the past 170 hour(s))   Magnesium Level    Collection Time: 02/22/19  8:46 AM   Result Value Ref Range    Magnesium 1.6 1.6 - 2.2 mg/dL   Tacrolimus level    Collection Time: 02/22/19  8:46 AM   Result Value Ref Range Tacrolimus, Timed 6.6 ng/mL   Comprehensive Metabolic Panel    Collection Time: 02/22/19  8:46 AM   Result Value Ref Range    Sodium 140 135 - 145 mmol/L    Potassium 3.8 3.5 - 5.0 mmol/L    Chloride 96 (L) 98 - 107 mmol/L    Anion Gap 14 7 - 15 mmol/L    CO2 30.0 22.0 - 30.0 mmol/L    BUN 42 (H) 7 - 21 mg/dL    Creatinine 1.61 (H) 0.60 - 1.00 mg/dL    BUN/Creatinine Ratio 11     EGFR CKD-EPI Non-African American, Female 15 (L) >=60 mL/min/1.80m2    EGFR CKD-EPI African American, Female 17 (L) >=60 mL/min/1.30m2    Glucose 102 70 - 179 mg/dL    Calcium 9.8 8.5 - 09.6 mg/dL    Albumin 4.4 3.5 - 5.0 g/dL    Total Protein 6.5 6.5 - 8.3 g/dL    Total Bilirubin 0.3 0.0 - 1.2 mg/dL    AST 15 14 - 38 U/L    ALT 13 <35 U/L    Alkaline Phosphatase 85 38 - 126 U/L   CBC w/ Differential    Collection Time: 02/22/19  8:46 AM   Result Value Ref Range    WBC 7.0 4.5 - 11.0 10*9/L    RBC 2.82 (L) 4.00 - 5.20 10*12/L    HGB 8.7 (L) 13.5 - 16.0 g/dL    HCT 04.5 (L) 40.9 - 46.0 %    MCV 93.1 80.0 - 100.0 fL    MCH 30.9 26.0 - 34.0 pg  MCHC 33.2 31.0 - 37.0 g/dL    RDW 16.1 09.6 - 04.5 %    MPV 6.6 (L) 7.0 - 10.0 fL    Platelet 398 150 - 440 10*9/L    Neutrophils % 90.9 %    Lymphocytes % 1.3 %    Monocytes % 3.6 %    Eosinophils % 2.1 %    Basophils % 1.1 %    Absolute Neutrophils 6.4 2.0 - 7.5 10*9/L    Absolute Lymphocytes 0.1 (L) 1.5 - 5.0 10*9/L    Absolute Monocytes 0.3 0.2 - 0.8 10*9/L    Absolute Eosinophils 0.2 0.0 - 0.4 10*9/L    Absolute Basophils 0.1 0.0 - 0.1 10*9/L    Large Unstained Cells 1 0 - 4 %   Iron & TIBC    Collection Time: 02/22/19  8:46 AM   Result Value Ref Range    Iron 35 35 - 165 ug/dL    TIBC 409.8 119.1 - 478.2 mg/dL    Transferrin 956.2 130.8 - 380.0 mg/dL    Iron Saturation (%) 9 (L) 15 - 50 %   Magnesium Level    Collection Time: 02/25/19  8:38 AM   Result Value Ref Range    Magnesium 1.6 1.6 - 2.2 mg/dL   Tacrolimus level    Collection Time: 02/25/19  8:38 AM   Result Value Ref Range    Tacrolimus, Timed 5.4 ng/mL   Comprehensive Metabolic Panel    Collection Time: 02/25/19  8:38 AM   Result Value Ref Range    Sodium 141 135 - 145 mmol/L    Potassium 4.6 3.5 - 5.0 mmol/L    Chloride 102 98 - 107 mmol/L    Anion Gap 14 7 - 15 mmol/L    CO2 25.0 22.0 - 30.0 mmol/L    BUN 63 (H) 7 - 21 mg/dL    Creatinine 6.57 (H) 0.60 - 1.00 mg/dL    BUN/Creatinine Ratio 12     EGFR CKD-EPI Non-African American, Female 10 (L) >=60 mL/min/1.5m2    EGFR CKD-EPI African American, Female 11 (L) >=60 mL/min/1.29m2    Glucose 106 70 - 179 mg/dL    Calcium 84.6 8.5 - 96.2 mg/dL    Albumin 4.1 3.5 - 5.0 g/dL    Total Protein 6.1 (L) 6.5 - 8.3 g/dL    Total Bilirubin 0.2 0.0 - 1.2 mg/dL    AST 20 14 - 38 U/L    ALT 22 <35 U/L    Alkaline Phosphatase 72 38 - 126 U/L   Ferritin    Collection Time: 02/25/19  8:38 AM   Result Value Ref Range    Ferritin 34.1 3.0 - 151.0 ng/mL   CBC w/ Differential    Collection Time: 02/25/19  8:38 AM   Result Value Ref Range    WBC 6.9 4.5 - 11.0 10*9/L    RBC 2.79 (L) 4.00 - 5.20 10*12/L    HGB 8.7 (L) 13.5 - 16.0 g/dL    HCT 95.2 (L) 84.1 - 46.0 %    MCV 94.1 80.0 - 100.0 fL    MCH 31.0 26.0 - 34.0 pg    MCHC 33.0 31.0 - 37.0 g/dL    RDW 32.4 40.1 - 02.7 %    MPV 6.9 (L) 7.0 - 10.0 fL    Platelet 402 150 - 440 10*9/L    Neutrophils % 88.7 %    Lymphocytes % 1.2 %    Monocytes % 6.0 %  Eosinophils % 1.5 %    Basophils % 1.1 %    Absolute Neutrophils 6.2 2.0 - 7.5 10*9/L    Absolute Lymphocytes 0.1 (L) 1.5 - 5.0 10*9/L    Absolute Monocytes 0.4 0.2 - 0.8 10*9/L    Absolute Eosinophils 0.1 0.0 - 0.4 10*9/L    Absolute Basophils 0.1 0.0 - 0.1 10*9/L    Large Unstained Cells 1 0 - 4 %   Magnesium Level    Collection Time: 02/27/19  8:51 AM   Result Value Ref Range    Magnesium 1.6 1.6 - 2.2 mg/dL   Comprehensive Metabolic Panel    Collection Time: 02/27/19  8:51 AM   Result Value Ref Range    Sodium 141 135 - 145 mmol/L    Potassium 4.6 3.5 - 5.0 mmol/L    Chloride 101 98 - 107 mmol/L    Anion Gap 14 7 - 15 mmol/L    CO2 26.0 22.0 - 30.0 mmol/L    BUN 53 (H) 7 - 21 mg/dL    Creatinine 1.61 (H) 0.60 - 1.00 mg/dL    BUN/Creatinine Ratio 12     EGFR CKD-EPI Non-African American, Female 12 (L) >=60 mL/min/1.1m2    EGFR CKD-EPI African American, Female 13 (L) >=60 mL/min/1.32m2    Glucose 100 70 - 179 mg/dL    Calcium 09.6 8.5 - 04.5 mg/dL    Albumin 4.3 3.5 - 5.0 g/dL    Total Protein 6.3 (L) 6.5 - 8.3 g/dL    Total Bilirubin 0.4 0.0 - 1.2 mg/dL    AST 16 14 - 38 U/L    ALT 20 <35 U/L    Alkaline Phosphatase 67 38 - 126 U/L   CBC w/ Differential    Collection Time: 02/27/19  8:51 AM   Result Value Ref Range    WBC 5.8 4.5 - 11.0 10*9/L    RBC 2.80 (L) 4.00 - 5.20 10*12/L    HGB 8.7 (L) 13.5 - 16.0 g/dL    HCT 40.9 (L) 81.1 - 46.0 %    MCV 94.3 80.0 - 100.0 fL    MCH 31.2 26.0 - 34.0 pg    MCHC 33.1 31.0 - 37.0 g/dL    RDW 91.4 78.2 - 95.6 %    MPV 6.8 (L) 7.0 - 10.0 fL    Platelet 388 150 - 440 10*9/L    Neutrophils % 88.4 %    Lymphocytes % 1.5 %    Monocytes % 5.3 %    Eosinophils % 2.0 %    Basophils % 1.2 %    Absolute Neutrophils 5.1 2.0 - 7.5 10*9/L    Absolute Lymphocytes 0.1 (L) 1.5 - 5.0 10*9/L    Absolute Monocytes 0.3 0.2 - 0.8 10*9/L    Absolute Eosinophils 0.1 0.0 - 0.4 10*9/L    Absolute Basophils 0.1 0.0 - 0.1 10*9/L    Large Unstained Cells 2 0 - 4 %     Scribe's Attestation: Leeroy Bock, MD obtained and performed the history, physical exam and medical decision making elements that were entered into the chart.  Signed by Delaney Meigs, Scribe, on Feb 27, 2019 11:48 AM      Documentation assistance provided by the Scribe. I was present during the time the encounter was recorded. The information recorded by the Scribe was done at my direction and has been reviewed and validated by me.

## 2019-02-28 ENCOUNTER — Ambulatory Visit: Admit: 2019-02-28 | Discharge: 2019-03-01 | Payer: MEDICARE

## 2019-02-28 DIAGNOSIS — Z940 Kidney transplant status: Principal | ICD-10-CM

## 2019-02-28 LAB — CBC W/ AUTO DIFF
BASOPHILS ABSOLUTE COUNT: 0.1 10*9/L (ref 0.0–0.1)
BASOPHILS RELATIVE PERCENT: 1.3 %
EOSINOPHILS ABSOLUTE COUNT: 0.1 10*9/L (ref 0.0–0.4)
EOSINOPHILS RELATIVE PERCENT: 1.9 %
HEMATOCRIT: 25.5 % — ABNORMAL LOW (ref 36.0–46.0)
HEMOGLOBIN: 8.7 g/dL — ABNORMAL LOW (ref 13.5–16.0)
LARGE UNSTAINED CELLS: 2 % (ref 0–4)
LYMPHOCYTES ABSOLUTE COUNT: 0.1 10*9/L — ABNORMAL LOW (ref 1.5–5.0)
MEAN CORPUSCULAR HEMOGLOBIN CONC: 33.9 g/dL (ref 31.0–37.0)
MEAN CORPUSCULAR HEMOGLOBIN: 31.9 pg (ref 26.0–34.0)
MEAN CORPUSCULAR VOLUME: 94 fL (ref 80.0–100.0)
MEAN PLATELET VOLUME: 6.8 fL — ABNORMAL LOW (ref 7.0–10.0)
MONOCYTES ABSOLUTE COUNT: 0.3 10*9/L (ref 0.2–0.8)
MONOCYTES RELATIVE PERCENT: 5.2 %
PLATELET COUNT: 391 10*9/L (ref 150–440)
RED BLOOD CELL COUNT: 2.72 10*12/L — ABNORMAL LOW (ref 4.00–5.20)
RED CELL DISTRIBUTION WIDTH: 13.7 % (ref 12.0–15.0)
WBC ADJUSTED: 5.5 10*9/L (ref 4.5–11.0)

## 2019-02-28 LAB — WBC ADJUSTED: Lab: 5.5

## 2019-02-28 LAB — MAGNESIUM: Magnesium:MCnc:Pt:Ser/Plas:Qn:: 1.5 — ABNORMAL LOW

## 2019-02-28 LAB — COMPREHENSIVE METABOLIC PANEL
ALBUMIN: 4.2 g/dL (ref 3.5–5.0)
ALKALINE PHOSPHATASE: 68 U/L (ref 38–126)
ALT (SGPT): 16 U/L (ref <35)
ANION GAP: 12 mmol/L (ref 7–15)
AST (SGOT): 15 U/L (ref 14–38)
BILIRUBIN TOTAL: 0.3 mg/dL (ref 0.0–1.2)
BLOOD UREA NITROGEN: 60 mg/dL — ABNORMAL HIGH (ref 7–21)
BUN / CREAT RATIO: 13
CALCIUM: 10.2 mg/dL (ref 8.5–10.2)
CHLORIDE: 103 mmol/L (ref 98–107)
CO2: 25 mmol/L (ref 22.0–30.0)
CREATININE: 4.65 mg/dL — ABNORMAL HIGH (ref 0.60–1.00)
EGFR CKD-EPI AA FEMALE: 13 mL/min/{1.73_m2} — ABNORMAL LOW (ref >=60)
EGFR CKD-EPI NON-AA FEMALE: 11 mL/min/{1.73_m2} — ABNORMAL LOW (ref >=60)
GLUCOSE RANDOM: 101 mg/dL (ref 70–179)
POTASSIUM: 4.6 mmol/L (ref 3.5–5.0)
PROTEIN TOTAL: 6.3 g/dL — ABNORMAL LOW (ref 6.5–8.3)

## 2019-02-28 LAB — TOXIC GRANULATION

## 2019-02-28 LAB — TACROLIMUS BLOOD: Lab: 4.2

## 2019-02-28 LAB — SLIDE REVIEW

## 2019-02-28 LAB — PROTEIN TOTAL: Protein:MCnc:Pt:Ser/Plas:Qn:: 6.3 — ABNORMAL LOW

## 2019-03-04 ENCOUNTER — Ambulatory Visit: Admit: 2019-03-04 | Discharge: 2019-03-05 | Payer: MEDICARE

## 2019-03-04 DIAGNOSIS — Z940 Kidney transplant status: Principal | ICD-10-CM

## 2019-03-04 LAB — COMPREHENSIVE METABOLIC PANEL
ALBUMIN: 4.2 g/dL (ref 3.5–5.0)
ALKALINE PHOSPHATASE: 72 U/L (ref 38–126)
ALT (SGPT): 16 U/L (ref <35)
ANION GAP: 15 mmol/L (ref 7–15)
AST (SGOT): 19 U/L (ref 14–38)
BILIRUBIN TOTAL: 0.2 mg/dL (ref 0.0–1.2)
BLOOD UREA NITROGEN: 61 mg/dL — ABNORMAL HIGH (ref 7–21)
BUN / CREAT RATIO: 14
CALCIUM: 10 mg/dL (ref 8.5–10.2)
CO2: 20 mmol/L — ABNORMAL LOW (ref 22.0–30.0)
CREATININE: 4.24 mg/dL — ABNORMAL HIGH (ref 0.60–1.00)
EGFR CKD-EPI AA FEMALE: 14 mL/min/{1.73_m2} — ABNORMAL LOW (ref >=60)
EGFR CKD-EPI NON-AA FEMALE: 12 mL/min/{1.73_m2} — ABNORMAL LOW (ref >=60)
GLUCOSE RANDOM: 104 mg/dL (ref 70–179)
PROTEIN TOTAL: 6.3 g/dL — ABNORMAL LOW (ref 6.5–8.3)
SODIUM: 142 mmol/L (ref 135–145)

## 2019-03-04 LAB — CBC W/ AUTO DIFF
BASOPHILS ABSOLUTE COUNT: 0.1 10*9/L (ref 0.0–0.1)
BASOPHILS RELATIVE PERCENT: 2.1 %
EOSINOPHILS ABSOLUTE COUNT: 0.1 10*9/L (ref 0.0–0.4)
EOSINOPHILS RELATIVE PERCENT: 2.5 %
HEMATOCRIT: 26.1 % — ABNORMAL LOW (ref 36.0–46.0)
HEMOGLOBIN: 8.7 g/dL — ABNORMAL LOW (ref 13.5–16.0)
LARGE UNSTAINED CELLS: 3 % (ref 0–4)
LYMPHOCYTES ABSOLUTE COUNT: 0 10*9/L — ABNORMAL LOW (ref 1.5–5.0)
LYMPHOCYTES RELATIVE PERCENT: 0.8 %
MEAN CORPUSCULAR HEMOGLOBIN CONC: 33.3 g/dL (ref 31.0–37.0)
MEAN CORPUSCULAR HEMOGLOBIN: 31.4 pg (ref 26.0–34.0)
MEAN PLATELET VOLUME: 7 fL (ref 7.0–10.0)
MONOCYTES ABSOLUTE COUNT: 0.2 10*9/L (ref 0.2–0.8)
MONOCYTES RELATIVE PERCENT: 4.7 %
NEUTROPHILS ABSOLUTE COUNT: 3.6 10*9/L (ref 2.0–7.5)
PLATELET COUNT: 361 10*9/L (ref 150–440)
RED BLOOD CELL COUNT: 2.76 10*12/L — ABNORMAL LOW (ref 4.00–5.20)
RED CELL DISTRIBUTION WIDTH: 13.7 % (ref 12.0–15.0)
WBC ADJUSTED: 4.2 10*9/L — ABNORMAL LOW (ref 4.5–11.0)

## 2019-03-04 LAB — TACROLIMUS BLOOD: Lab: 7.4

## 2019-03-04 LAB — RED CELL DISTRIBUTION WIDTH: Lab: 13.7

## 2019-03-04 LAB — SMEAR REVIEW

## 2019-03-04 LAB — MAGNESIUM: Magnesium:MCnc:Pt:Ser/Plas:Qn:: 1.6

## 2019-03-04 LAB — CHLORIDE: Chloride:SCnc:Pt:Ser/Plas:Qn:: 107

## 2019-03-04 NOTE — Unmapped (Signed)
Pt advised to hold dialysis today, UO 750 ml. Educated to call if SOB or S/S lower extremity edema develop.

## 2019-03-07 ENCOUNTER — Ambulatory Visit: Admit: 2019-03-07 | Discharge: 2019-03-08 | Payer: MEDICARE

## 2019-03-07 DIAGNOSIS — Z940 Kidney transplant status: Principal | ICD-10-CM

## 2019-03-07 LAB — CBC W/ AUTO DIFF
BASOPHILS ABSOLUTE COUNT: 0.1 10*9/L (ref 0.0–0.1)
EOSINOPHILS ABSOLUTE COUNT: 0.1 10*9/L (ref 0.0–0.4)
EOSINOPHILS RELATIVE PERCENT: 2.7 %
HEMATOCRIT: 26 % — ABNORMAL LOW (ref 36.0–46.0)
LARGE UNSTAINED CELLS: 2 % (ref 0–4)
LYMPHOCYTES ABSOLUTE COUNT: 0 10*9/L — ABNORMAL LOW (ref 1.5–5.0)
LYMPHOCYTES RELATIVE PERCENT: 1.1 %
MEAN CORPUSCULAR HEMOGLOBIN CONC: 32.8 g/dL (ref 31.0–37.0)
MEAN CORPUSCULAR HEMOGLOBIN: 31.1 pg (ref 26.0–34.0)
MEAN CORPUSCULAR VOLUME: 94.7 fL (ref 80.0–100.0)
MEAN PLATELET VOLUME: 7.2 fL (ref 7.0–10.0)
MONOCYTES ABSOLUTE COUNT: 0.2 10*9/L (ref 0.2–0.8)
MONOCYTES RELATIVE PERCENT: 4 %
NEUTROPHILS ABSOLUTE COUNT: 3.5 10*9/L (ref 2.0–7.5)
NEUTROPHILS RELATIVE PERCENT: 89 %
PLATELET COUNT: 348 10*9/L (ref 150–440)
RED BLOOD CELL COUNT: 2.75 10*12/L — ABNORMAL LOW (ref 4.00–5.20)
RED CELL DISTRIBUTION WIDTH: 13.8 % (ref 12.0–15.0)

## 2019-03-07 LAB — COMPREHENSIVE METABOLIC PANEL
ALBUMIN: 4.1 g/dL (ref 3.5–5.0)
ALKALINE PHOSPHATASE: 68 U/L (ref 38–126)
ALT (SGPT): 25 U/L (ref <35)
ANION GAP: 13 mmol/L (ref 7–15)
BILIRUBIN TOTAL: 0.3 mg/dL (ref 0.0–1.2)
BLOOD UREA NITROGEN: 61 mg/dL — ABNORMAL HIGH (ref 7–21)
BUN / CREAT RATIO: 16
CALCIUM: 9.9 mg/dL (ref 8.5–10.2)
CHLORIDE: 111 mmol/L — ABNORMAL HIGH (ref 98–107)
CO2: 17 mmol/L — ABNORMAL LOW (ref 22.0–30.0)
CREATININE: 3.76 mg/dL — ABNORMAL HIGH (ref 0.60–1.00)
EGFR CKD-EPI AA FEMALE: 17 mL/min/{1.73_m2} — ABNORMAL LOW (ref >=60)
EGFR CKD-EPI NON-AA FEMALE: 14 mL/min/{1.73_m2} — ABNORMAL LOW (ref >=60)
GLUCOSE RANDOM: 99 mg/dL (ref 70–179)
PROTEIN TOTAL: 6.2 g/dL — ABNORMAL LOW (ref 6.5–8.3)
SODIUM: 141 mmol/L (ref 135–145)

## 2019-03-07 LAB — NEUTROPHILS RELATIVE PERCENT: Lab: 89

## 2019-03-07 LAB — MAGNESIUM: Magnesium:MCnc:Pt:Ser/Plas:Qn:: 1.6

## 2019-03-07 LAB — TACROLIMUS BLOOD: Lab: 4.6

## 2019-03-07 LAB — CHLORIDE: Chloride:SCnc:Pt:Ser/Plas:Qn:: 111 — ABNORMAL HIGH

## 2019-03-08 ENCOUNTER — Ambulatory Visit: Admit: 2019-03-08 | Discharge: 2019-03-09 | Payer: MEDICARE

## 2019-03-08 DIAGNOSIS — Z940 Kidney transplant status: Principal | ICD-10-CM

## 2019-03-08 NOTE — Unmapped (Addendum)
1215: Patient arrived Transplant Infusion Room today for Belatacept, Condition: well; Mobility: ambulating; accompanied by self.    1224: VS stable,  1225: PIV placed with brisk blood return.  1230: Infusion initiated.  1309: Infusion complete.  1315: VS stable, PIV removed, pt left clinic, Condition: well; Mobility: ambulating; accompanied by self.  See Flowsheet and MAR for all details of visit.

## 2019-03-08 NOTE — Unmapped (Signed)
left message, called to schedule video appt with Dr Nestor Lewandowsky 5/26-5/27

## 2019-03-11 ENCOUNTER — Ambulatory Visit: Admit: 2019-03-11 | Discharge: 2019-03-12 | Payer: MEDICARE

## 2019-03-11 DIAGNOSIS — Z940 Kidney transplant status: Principal | ICD-10-CM

## 2019-03-11 LAB — CBC W/ AUTO DIFF
BASOPHILS ABSOLUTE COUNT: 0 10*9/L (ref 0.0–0.1)
BASOPHILS RELATIVE PERCENT: 1.2 %
EOSINOPHILS ABSOLUTE COUNT: 0.1 10*9/L (ref 0.0–0.4)
EOSINOPHILS RELATIVE PERCENT: 3.7 %
HEMATOCRIT: 26.1 % — ABNORMAL LOW (ref 36.0–46.0)
HEMOGLOBIN: 8.6 g/dL — ABNORMAL LOW (ref 13.5–16.0)
LARGE UNSTAINED CELLS: 2 % (ref 0–4)
LYMPHOCYTES ABSOLUTE COUNT: 0.1 10*9/L — ABNORMAL LOW (ref 1.5–5.0)
LYMPHOCYTES RELATIVE PERCENT: 2.6 %
MEAN CORPUSCULAR HEMOGLOBIN CONC: 32.7 g/dL (ref 31.0–37.0)
MEAN CORPUSCULAR HEMOGLOBIN: 30.7 pg (ref 26.0–34.0)
MEAN CORPUSCULAR VOLUME: 93.8 fL (ref 80.0–100.0)
MONOCYTES ABSOLUTE COUNT: 0.2 10*9/L (ref 0.2–0.8)
NEUTROPHILS ABSOLUTE COUNT: 2.9 10*9/L (ref 2.0–7.5)
NEUTROPHILS RELATIVE PERCENT: 84.2 %
PLATELET COUNT: 346 10*9/L (ref 150–440)
RED CELL DISTRIBUTION WIDTH: 13.6 % (ref 12.0–15.0)
WBC ADJUSTED: 3.5 10*9/L — ABNORMAL LOW (ref 4.5–11.0)

## 2019-03-11 LAB — COMPREHENSIVE METABOLIC PANEL
ALKALINE PHOSPHATASE: 70 U/L (ref 38–126)
ALT (SGPT): 16 U/L (ref <35)
AST (SGOT): 18 U/L (ref 14–38)
BILIRUBIN TOTAL: 0.3 mg/dL (ref 0.0–1.2)
BUN / CREAT RATIO: 14
CALCIUM: 10.2 mg/dL (ref 8.5–10.2)
CHLORIDE: 107 mmol/L (ref 98–107)
CO2: 19 mmol/L — ABNORMAL LOW (ref 22.0–30.0)
CREATININE: 3.37 mg/dL — ABNORMAL HIGH (ref 0.60–1.00)
EGFR CKD-EPI AA FEMALE: 19 mL/min/{1.73_m2} — ABNORMAL LOW (ref >=60)
EGFR CKD-EPI NON-AA FEMALE: 16 mL/min/{1.73_m2} — ABNORMAL LOW (ref >=60)
GLUCOSE RANDOM: 97 mg/dL (ref 70–179)
POTASSIUM: 5.1 mmol/L — ABNORMAL HIGH (ref 3.5–5.0)
PROTEIN TOTAL: 6.2 g/dL — ABNORMAL LOW (ref 6.5–8.3)
SODIUM: 138 mmol/L (ref 135–145)

## 2019-03-11 LAB — MAGNESIUM: Magnesium:MCnc:Pt:Ser/Plas:Qn:: 1.5 — ABNORMAL LOW

## 2019-03-11 LAB — BLOOD UREA NITROGEN: Urea nitrogen:MCnc:Pt:Ser/Plas:Qn:: 46 — ABNORMAL HIGH

## 2019-03-11 LAB — TACROLIMUS BLOOD: Lab: 6.1

## 2019-03-11 LAB — EOSINOPHILS ABSOLUTE COUNT: Lab: 0.1

## 2019-03-11 LAB — SMEAR REVIEW

## 2019-03-11 LAB — SLIDE REVIEW

## 2019-03-12 LAB — PHOSPHORUS: Phosphate:MCnc:Pt:Ser/Plas:Qn:: 2.7 — ABNORMAL LOW

## 2019-03-12 NOTE — Unmapped (Signed)
Mailed patient information letter about [employer].

## 2019-03-14 ENCOUNTER — Ambulatory Visit: Admit: 2019-03-14 | Discharge: 2019-03-15 | Payer: MEDICARE

## 2019-03-14 DIAGNOSIS — Z940 Kidney transplant status: Principal | ICD-10-CM

## 2019-03-14 LAB — CBC W/ AUTO DIFF
BASOPHILS ABSOLUTE COUNT: 0.1 10*9/L (ref 0.0–0.1)
BASOPHILS RELATIVE PERCENT: 1.5 %
EOSINOPHILS ABSOLUTE COUNT: 0.1 10*9/L (ref 0.0–0.4)
EOSINOPHILS RELATIVE PERCENT: 3.7 %
HEMATOCRIT: 26.8 % — ABNORMAL LOW (ref 36.0–46.0)
HEMOGLOBIN: 8.6 g/dL — ABNORMAL LOW (ref 13.5–16.0)
LARGE UNSTAINED CELLS: 3 % (ref 0–4)
LYMPHOCYTES ABSOLUTE COUNT: 0.1 10*9/L — ABNORMAL LOW (ref 1.5–5.0)
LYMPHOCYTES RELATIVE PERCENT: 1.5 %
MEAN CORPUSCULAR HEMOGLOBIN CONC: 32.3 g/dL (ref 31.0–37.0)
MEAN CORPUSCULAR HEMOGLOBIN: 30.4 pg (ref 26.0–34.0)
MONOCYTES ABSOLUTE COUNT: 0.4 10*9/L (ref 0.2–0.8)
MONOCYTES RELATIVE PERCENT: 9.5 %
NEUTROPHILS ABSOLUTE COUNT: 3.2 10*9/L (ref 2.0–7.5)
NEUTROPHILS RELATIVE PERCENT: 81 %
PLATELET COUNT: 385 10*9/L (ref 150–440)
RED BLOOD CELL COUNT: 2.84 10*12/L — ABNORMAL LOW (ref 4.00–5.20)
RED CELL DISTRIBUTION WIDTH: 13.8 % (ref 12.0–15.0)
WBC ADJUSTED: 3.9 10*9/L — ABNORMAL LOW (ref 4.5–11.0)

## 2019-03-14 LAB — COMPREHENSIVE METABOLIC PANEL
ALBUMIN: 4.2 g/dL (ref 3.5–5.0)
ALKALINE PHOSPHATASE: 74 U/L (ref 38–126)
ALT (SGPT): 16 U/L (ref <35)
ANION GAP: 9 mmol/L (ref 7–15)
AST (SGOT): 18 U/L (ref 14–38)
BILIRUBIN TOTAL: 0.3 mg/dL (ref 0.0–1.2)
BLOOD UREA NITROGEN: 40 mg/dL — ABNORMAL HIGH (ref 7–21)
BUN / CREAT RATIO: 14
CHLORIDE: 111 mmol/L — ABNORMAL HIGH (ref 98–107)
CO2: 19 mmol/L — ABNORMAL LOW (ref 22.0–30.0)
CREATININE: 2.92 mg/dL — ABNORMAL HIGH (ref 0.60–1.00)
EGFR CKD-EPI NON-AA FEMALE: 20 mL/min/{1.73_m2} — ABNORMAL LOW (ref >=60)
GLUCOSE RANDOM: 94 mg/dL (ref 70–179)
POTASSIUM: 5.1 mmol/L — ABNORMAL HIGH (ref 3.5–5.0)
PROTEIN TOTAL: 6.2 g/dL — ABNORMAL LOW (ref 6.5–8.3)
SODIUM: 139 mmol/L (ref 135–145)

## 2019-03-14 LAB — PHOSPHORUS: Phosphate:MCnc:Pt:Ser/Plas:Qn:: 2.7 — ABNORMAL LOW

## 2019-03-14 LAB — NEUTROPHILS RELATIVE PERCENT: Lab: 81

## 2019-03-14 LAB — AST (SGOT): Aspartate aminotransferase:CCnc:Pt:Ser/Plas:Qn:: 18

## 2019-03-14 LAB — SLIDE REVIEW

## 2019-03-14 LAB — MAGNESIUM: Magnesium:MCnc:Pt:Ser/Plas:Qn:: 1.4 — ABNORMAL LOW

## 2019-03-14 LAB — SMEAR REVIEW

## 2019-03-14 NOTE — Unmapped (Signed)
Pt last dialysis is 5/14 advised to hold dialysis today. Will follow up on repeat labs Monday.

## 2019-03-15 LAB — TACROLIMUS BLOOD: Lab: 3.8

## 2019-03-19 ENCOUNTER — Telehealth: Admit: 2019-03-19 | Discharge: 2019-03-19 | Payer: MEDICARE | Attending: Nephrology | Primary: Nephrology

## 2019-03-19 ENCOUNTER — Institutional Professional Consult (permissible substitution): Admit: 2019-03-19 | Discharge: 2019-03-19 | Payer: MEDICARE

## 2019-03-19 ENCOUNTER — Ambulatory Visit: Admit: 2019-03-19 | Discharge: 2019-03-19 | Payer: MEDICARE

## 2019-03-19 DIAGNOSIS — Z940 Kidney transplant status: Principal | ICD-10-CM

## 2019-03-19 LAB — CBC W/ AUTO DIFF
BASOPHILS ABSOLUTE COUNT: 0.1 10*9/L (ref 0.0–0.1)
BASOPHILS RELATIVE PERCENT: 1.8 %
EOSINOPHILS ABSOLUTE COUNT: 0.1 10*9/L (ref 0.0–0.4)
EOSINOPHILS RELATIVE PERCENT: 3.1 %
HEMATOCRIT: 25.8 % — ABNORMAL LOW (ref 36.0–46.0)
HEMOGLOBIN: 8.5 g/dL — ABNORMAL LOW (ref 13.5–16.0)
LARGE UNSTAINED CELLS: 3 % (ref 0–4)
LYMPHOCYTES ABSOLUTE COUNT: 0.1 10*9/L — ABNORMAL LOW (ref 1.5–5.0)
LYMPHOCYTES RELATIVE PERCENT: 1.7 %
MEAN CORPUSCULAR HEMOGLOBIN CONC: 33 g/dL (ref 31.0–37.0)
MEAN CORPUSCULAR HEMOGLOBIN: 30.6 pg (ref 26.0–34.0)
MEAN CORPUSCULAR VOLUME: 92.8 fL (ref 80.0–100.0)
MEAN PLATELET VOLUME: 7.3 fL (ref 7.0–10.0)
MONOCYTES ABSOLUTE COUNT: 0.4 10*9/L (ref 0.2–0.8)
MONOCYTES RELATIVE PERCENT: 8.3 %
NEUTROPHILS RELATIVE PERCENT: 82.1 %
RED BLOOD CELL COUNT: 2.79 10*12/L — ABNORMAL LOW (ref 4.00–5.20)
RED CELL DISTRIBUTION WIDTH: 13.8 % (ref 12.0–15.0)
WBC ADJUSTED: 4.4 10*9/L — ABNORMAL LOW (ref 4.5–11.0)

## 2019-03-19 LAB — COMPREHENSIVE METABOLIC PANEL
ALKALINE PHOSPHATASE: 80 U/L (ref 38–126)
ALT (SGPT): 11 U/L (ref <35)
ANION GAP: 7 mmol/L (ref 7–15)
BILIRUBIN TOTAL: 0.3 mg/dL (ref 0.0–1.2)
BLOOD UREA NITROGEN: 37 mg/dL — ABNORMAL HIGH (ref 7–21)
BUN / CREAT RATIO: 15
CALCIUM: 9.5 mg/dL (ref 8.5–10.2)
CHLORIDE: 111 mmol/L — ABNORMAL HIGH (ref 98–107)
CO2: 21 mmol/L — ABNORMAL LOW (ref 22.0–30.0)
CREATININE: 2.48 mg/dL — ABNORMAL HIGH (ref 0.60–1.00)
EGFR CKD-EPI AA FEMALE: 28 mL/min/{1.73_m2} — ABNORMAL LOW (ref >=60)
EGFR CKD-EPI NON-AA FEMALE: 24 mL/min/{1.73_m2} — ABNORMAL LOW (ref >=60)
GLUCOSE RANDOM: 89 mg/dL (ref 70–179)
POTASSIUM: 4.8 mmol/L (ref 3.5–5.0)
PROTEIN TOTAL: 6 g/dL — ABNORMAL LOW (ref 6.5–8.3)
SODIUM: 139 mmol/L (ref 135–145)

## 2019-03-19 LAB — PHOSPHORUS: Phosphate:MCnc:Pt:Ser/Plas:Qn:: 2.4 — ABNORMAL LOW

## 2019-03-19 LAB — AST (SGOT): Aspartate aminotransferase:CCnc:Pt:Ser/Plas:Qn:: 13 — ABNORMAL LOW

## 2019-03-19 LAB — MAGNESIUM: Magnesium:MCnc:Pt:Ser/Plas:Qn:: 1.3 — ABNORMAL LOW

## 2019-03-19 LAB — TACROLIMUS BLOOD: Lab: 2.9

## 2019-03-19 LAB — SMEAR REVIEW

## 2019-03-19 LAB — RED CELL DISTRIBUTION WIDTH: Lab: 13.8

## 2019-03-19 MED ORDER — PREDNISONE 5 MG TABLET: 10 mg | tablet | Freq: Every day | 11 refills | 0 days | Status: AC

## 2019-03-19 MED ORDER — PROGRAF 1 MG CAPSULE: 1 mg | capsule | Freq: Two times a day (BID) | 11 refills | 0 days | Status: AC

## 2019-03-19 MED ORDER — MYFORTIC 180 MG TABLET,DELAYED RELEASE: 720 mg | tablet | Freq: Two times a day (BID) | 11 refills | 0 days | Status: AC

## 2019-03-19 MED ORDER — PREDNISONE 5 MG TABLET
ORAL_TABLET | Freq: Every day | ORAL | 11 refills | 0.00000 days | Status: CP
Start: 2019-03-19 — End: 2019-03-19
  Filled 2019-04-17: qty 30, 15d supply, fill #0

## 2019-03-19 MED ORDER — PROGRAF 1 MG CAPSULE
ORAL_CAPSULE | Freq: Two times a day (BID) | ORAL | 11 refills | 0.00000 days | Status: CP
Start: 2019-03-19 — End: 2019-03-19
  Filled 2019-03-21: qty 90, 30d supply, fill #0

## 2019-03-19 MED ORDER — MYFORTIC 180 MG TABLET,DELAYED RELEASE
ORAL_TABLET | Freq: Two times a day (BID) | ORAL | 11 refills | 23.00000 days | Status: CP
Start: 2019-03-19 — End: 2019-06-04
  Filled 2019-03-21: qty 180, 23d supply, fill #0

## 2019-03-19 NOTE — Unmapped (Signed)
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  2nd kidney transplant - 2000-2002 - DD failed chronic rejection.  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 Her last dialysis was on 03/07/19.  Has a good U.O.    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    Creatinine - value 2.48, improving  Urine analysis: protein 100 mg/dl / WBC >161 / RBC <1 (0/9604)  Urine protein/creatinine ratio: 0.746 (12/21/18)  DSA: negative - 12/24/18    Last CMV checked: not detected - 12/17/18  Last BKV checked: not checked    Prophylaxis -  Bactrim 80 mg TIW (until 06/05/19)    2) Immunosuppression: Prograf 3/2 mg / Myfortic 540 mg BID,  Prednisone 5 mg  - Prograf level 3.8 (03/14/19)  and Last dose of Prograf: 9:00 p.m  - Changes in Immunosuppression - Last received belatacept on 03/08/19. Increase prednisone to 10 mg today. Increase Myfortic to 720 BID on Friday. Start tapering Prograf today.  - Medications side effects: No    3) Hypotension - Controlled -off midodrine now.  Goal for B.P > 100   Changes in B.P medications - none.    4) Anemia - stable - Hgb 8.5   Goal for Hemoglobin > 11.0  Ferritin 34.1 (02/25/19)  - continue iron supplement TID    5) MBD - Calcium 9.5 / Phosphorus 2.4 - Secondary hyperparathyroidism  On calcitriol 0.25 mcg daily  iPTH - 73.8 (12/05/18)    6) Electrolytes: WNL    7) Immunizations:  Influenza (inactivated only): 08/09/18  Pneumococcal vaccination (inactivated only):     8) Cancer screening:  PAP smear - 3/15 negative   Mammogram - Age 83.    Follow up: 4 weeks.    I spent 10 minutes on the audio/video with the patient. I spent an additional 15 minutes on pre- and post-visit activities.     The patient was physically located in [place] or a state in which I am permitted to provide care. The patient and/or parent/gauardian understood that s/he may incur co-pays and cost sharing, and agreed to the telemedicine visit. The visit was completed via phone and/or video, which was appropriate and reasonable under the circumstances given the patient's presentation at the time.    The patient and/or parent/guardian has been advised of the potential risks and limitations of this mode of treatment (including, but not limited to, the absence of in-person examination) and has agreed to be treated using telemedicine. The patient's/patient's family's questions regarding telemedicine have been answered.     If the phone/video visit was completed in an ambulatory setting, the patient and/or parent/guardian has also been advised to contact their provider???s office for worsening conditions, and seek emergency medical treatment and/or call 911 if the patient deems either necessary.        History of Presenting Illness:  Patient's post transplant course has been complicated by delayed graft function, currently off hemodialysis. UOP: 1000 - 2190 ml/day.    Last belatacept on 03/08/19    Patient presents today complaining of bilateral LE edema and edema in her left arm (arm with fistula). She does feel that her swelling has worsened since starting prednisone. She denies chest pain, tightness. She does have occasional headaches which she attributes to caffeine        Last dose of Prograf at 9 p.m.   Diabetes: No   HTN: Yes: Hypotension    Controlled:Yes   Adherence      With Medication: yes    With Follow up: yes    Functional Status: independent    Patient was found to have reflux nephropathy at age 64 and was on peritoneal dialysis briefly before receiving a living related renal transplant from mother. She had some thrombotic complication and underwent nephrectomy in 1997. She started dialysis and had a second kidney transplant at age 56 in 74. 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 She received a 3rd kidney transplant in 11/2002 which failed in 2007 due to chronic rejection.    She has decreased vision both eyes.    Review of Systems:     Fever or chills: negative   Sore throat: negative   Fatigue/malaise: negative   Weight loss or gain: negative   New skin rash/lump or bump: negative   Problems with teeth/gums: negative   Chest pain: negative   Cough or shortness of breath: negative   Swelling: Bl L.E - 1-2 + edema  Belly pain/heartburn/nausea/vomiting or diarrhea: negative   Pain or bleeding when urinating: negative   Twitching/numbness or weakness: negative     Physical Exam:     Not done    Renal Transplant History:    Race:  Caucasian   Age of recipient (at time of transplant): 38 y.o.   Cause of kidney disease: Reflux nephropathy   Native biopsy: No    Date of transplant: 12/05/18   Type of transplant: KDPI - 39%    - Donor creatinine: 1.3    - Any co morbidities: No    - Infection in donor: No    - HLA Mismatch: N/A    - Ischemia time: 6h 30m    - Crossmatch: negative    - Donor kidney biopsy: No diagnostic abnormalities identified (12/05/18)     Induction: [place] - alemtuzumab   Maintenance IS at the time of transplant: tacrolimus, mycophenolate   DGF: Yes   Diabetes Onset after transplant: No    Allergies:   Allergies   Allergen Reactions   ??? Ancef [Cefazolin] Shortness Of Breath   ??? Adhesive Tape-Silicones      Other reaction(s): Other (See Comments)  Uncoded Allergy. Allergen: plastic tape, Other Reaction: blistering   ??? Demerol [Meperidine] Nausea And Vomiting        Current Medications: Current Outpatient Medications   Medication Sig Dispense Refill   ??? acetaminophen (TYLENOL) 500 MG tablet Take 2 tablets (1,000 mg total) by mouth every six (6) hours as needed for pain. 100 tablet 0   ??? aspirin (ECOTRIN) 81 MG tablet Take 1 tablet (81 mg total) by mouth daily. 30 tablet 11   ??? calcitrioL (ROCALTROL) 0.25 MCG capsule Take 1 capsule (0.25 mcg total) by mouth daily. 30 capsule 11   ??? carboxymethylcellulose sodium (REFRESH CELLUVISC) 1 % DpGe Instill drops in  affected eye(s) as directed as needed.     ??? ferrous sulfate 325 (65 FE) MG tablet Take 325 mg by mouth daily.     ??? MYFORTIC 180 mg EC tablet Take 3 tablets (540 mg total) by mouth Two (2) times a day. 180 tablet 11   ??? omeprazole (PRILOSEC) 40 MG capsule Take 1 capsule (40 mg total) by mouth daily. 30 capsule 11   ??? predniSONE (DELTASONE) 5 MG tablet Take 1 tablet (5 mg total) by mouth daily. 30 tablet 11   ??? PROGRAF 1 mg capsule Take 3 capsules (3 mg total) by mouth in the morning and 2 capsules (2 mg total) nightly. 150 capsule 11   ??? sulfamethoxazole-trimethoprim (BACTRIM) 400-80 mg per tablet Take 1 tablet (80 mg of trimethoprim total) by mouth 3 (three) times a week. 12 tablet 5     No current facility-administered medications for this visit.        Past Medical History:   Past Medical History:   Diagnosis Date   ??? Bell's palsy    ??? Disease of thyroid gland    ??? ESRD (end stage renal disease) (CMS-HCC)    ??? Nonarteritic ischemic optic neuropathy    ??? Reflux nephropathy         Laboratory studies:     Recent Results (from the past 170 hour(s))   Magnesium Level    Collection Time: 03/14/19  8:51 AM   Result Value Ref Range    Magnesium 1.4 (L) 1.6 - 2.2 mg/dL   Tacrolimus level    Collection Time: 03/14/19  8:51 AM   Result Value Ref Range    Tacrolimus, Timed 3.8 ng/mL   Comprehensive Metabolic Panel    Collection Time: 03/14/19  8:51 AM   Result Value Ref Range    Sodium 139 135 - 145 mmol/L    Potassium 5.1 (H) 3.5 - 5.0 mmol/L Chloride 111 (H) 98 - 107 mmol/L    Anion Gap 9 7 - 15 mmol/L    CO2 19.0 (L) 22.0 - 30.0 mmol/L    BUN 40 (H) 7 - 21 mg/dL    Creatinine 1.61 (H) 0.60 - 1.00 mg/dL    BUN/Creatinine Ratio 14     EGFR CKD-EPI Non-African American, Female 20 (L) >=60 mL/min/1.6m2    EGFR CKD-EPI African American, Female 23 (L) >=60 mL/min/1.77m2    Glucose 94 70 - 179 mg/dL    Calcium 9.7 8.5 - 09.6 mg/dL    Albumin 4.2 3.5 - 5.0 g/dL    Total Protein 6.2 (L) 6.5 - 8.3 g/dL    Total Bilirubin 0.3 0.0 - 1.2 mg/dL    AST 18 14 - 38 U/L    ALT 16 <35 U/L    Alkaline Phosphatase 74 38 - 126 U/L   Phosphorus Level    Collection Time: 03/14/19  8:51 AM   Result Value Ref Range    Phosphorus 2.7 (L) 2.9 - 4.7 mg/dL   CBC w/ Differential    Collection Time: 03/14/19  8:51 AM   Result Value Ref Range    WBC 3.9 (L) 4.5 - 11.0 10*9/L    RBC 2.84 (L) 4.00 - 5.20 10*12/L    HGB 8.6 (L) 13.5 - 16.0 g/dL    HCT 04.5 (L) 40.9 - 46.0 %    MCV 94.2 80.0 - 100.0 fL    MCH 30.4 26.0 - 34.0 pg    MCHC 32.3 31.0 - 37.0 g/dL    RDW 13.8  12.0 - 15.0 %    MPV 6.5 (L) 7.0 - 10.0 fL    Platelet 385 150 - 440 10*9/L    Neutrophils % 81.0 %    Lymphocytes % 1.5 %    Monocytes % 9.5 %    Eosinophils % 3.7 %    Basophils % 1.5 %    Neutrophil Left Shift 2+ (A) Not Present    Absolute Neutrophils 3.2 2.0 - 7.5 10*9/L    Absolute Lymphocytes 0.1 (L) 1.5 - 5.0 10*9/L    Absolute Monocytes 0.4 0.2 - 0.8 10*9/L    Absolute Eosinophils 0.1 0.0 - 0.4 10*9/L    Absolute Basophils 0.1 0.0 - 0.1 10*9/L    Large Unstained Cells 3 0 - 4 %   Morphology Review    Collection Time: 03/14/19  8:51 AM   Result Value Ref Range    Smear Review Comments See Comment (A) Undefined    Toxic Granulation Present (A) Not Present   Magnesium Level    Collection Time: 03/19/19  8:43 AM   Result Value Ref Range    Magnesium 1.3 (L) 1.6 - 2.2 mg/dL   Comprehensive Metabolic Panel    Collection Time: 03/19/19  8:43 AM   Result Value Ref Range    Sodium 139 135 - 145 mmol/L    Potassium 4.8 3.5 - 5.0 mmol/L    Chloride 111 (H) 98 - 107 mmol/L    Anion Gap 7 7 - 15 mmol/L    CO2 21.0 (L) 22.0 - 30.0 mmol/L    BUN 37 (H) 7 - 21 mg/dL    Creatinine 1.61 (H) 0.60 - 1.00 mg/dL    BUN/Creatinine Ratio 15     EGFR CKD-EPI Non-African American, Female 24 (L) >=60 mL/min/1.8m2    EGFR CKD-EPI African American, Female 28 (L) >=60 mL/min/1.2m2    Glucose 89 70 - 179 mg/dL    Calcium 9.5 8.5 - 09.6 mg/dL    Albumin 4.0 3.5 - 5.0 g/dL    Total Protein 6.0 (L) 6.5 - 8.3 g/dL    Total Bilirubin 0.3 0.0 - 1.2 mg/dL    AST 13 (L) 14 - 38 U/L    ALT 11 <35 U/L    Alkaline Phosphatase 80 38 - 126 U/L   Phosphorus Level    Collection Time: 03/19/19  8:43 AM   Result Value Ref Range    Phosphorus 2.4 (L) 2.9 - 4.7 mg/dL   CBC w/ Differential    Collection Time: 03/19/19  8:43 AM   Result Value Ref Range    WBC 4.4 (L) 4.5 - 11.0 10*9/L    RBC 2.79 (L) 4.00 - 5.20 10*12/L    HGB 8.5 (L) 13.5 - 16.0 g/dL    HCT 04.5 (L) 40.9 - 46.0 %    MCV 92.8 80.0 - 100.0 fL    MCH 30.6 26.0 - 34.0 pg    MCHC 33.0 31.0 - 37.0 g/dL    RDW 81.1 91.4 - 78.2 %    MPV 7.3 7.0 - 10.0 fL    Platelet 372 150 - 440 10*9/L    Neutrophils % 82.1 %    Lymphocytes % 1.7 %    Monocytes % 8.3 %    Eosinophils % 3.1 %    Basophils % 1.8 %    Absolute Neutrophils 3.6 2.0 - 7.5 10*9/L    Absolute Lymphocytes 0.1 (L) 1.5 - 5.0 10*9/L    Absolute Monocytes 0.4 0.2 - 0.8 10*9/L  Absolute Eosinophils 0.1 0.0 - 0.4 10*9/L    Absolute Basophils 0.1 0.0 - 0.1 10*9/L    Large Unstained Cells 3 0 - 4 %   Morphology Review    Collection Time: 03/19/19  8:43 AM   Result Value Ref Range    Smear Review Comments See Comment (A) Undefined     Scribe's Attestation: Leeroy Bock, MD obtained and performed the history, physical exam and medical decision making elements that were entered into the chart.  Signed by Delaney Meigs, Scribe, on Mar 19, 2019 12:29 PM      Documentation assistance provided by the Scribe. I was present during the time the encounter was recorded. The information recorded by the Scribe was done at my direction and has been reviewed and validated by me.

## 2019-03-19 NOTE — Unmapped (Signed)
[facility] Refill Coordination Note    Specialty Medication(s) to be Shipped:   Transplant: Myfortic 180mg , Prograf 1mg  and Prednisone 5mg   Other medication(s) to be shipped: Aspirin 81mg , Calcitriol 0.60mcg, Omeprazole 40mg  & Sulfa-Trime 400-80mg      Kimberly [name], DOB: 1981-06-29  Phone: [phone] (home)     All above HIPAA information was verified with patient.     Completed refill call assessment today to schedule patient's medication shipment from the [facility] [phone]).       Specialty medication(s) and dose(s) confirmed: Regimen is correct and unchanged.   Changes to medications: Kimberly [name] reports no changes reported at this time.  Changes to insurance: No  Questions for the pharmacist: No    Confirmed patient received Welcome Packet with first shipment. The patient will receive a drug information handout for each medication shipped and additional FDA Medication Guides as required.       DISEASE/MEDICATION-SPECIFIC INFORMATION        N/A    SPECIALTY MEDICATION ADHERENCE     Medication Adherence    Patient reported X missed doses in the last month:  0  Specialty Medication:  Myfortic 180mg   Patient is on additional specialty medications:  Yes  Additional Specialty Medications:  Prograf 1mg    Patient Reported Additional Medication X Missed Doses in the Last Month:  0  Patient is on more than two specialty medications:  Yes  Specialty Medication:  Prednisone 10mg   Patient Reported Additional Medication X Missed Doses in the Last Month:  0        Prednisone 5 mg: 4 days of medicine on hand   Myfortic 180 mg: 4 days of medicine on hand   Prograf 1 mg: 4 days of medicine on hand     SHIPPING     Shipping address confirmed in Epic.     Delivery Scheduled: Yes, Expected medication delivery date: 03/22/2019.     Medication will be delivered via UPS to the home address in [employer] [place].    Kimberly [name]   [facility] Specialty Technician

## 2019-03-19 NOTE — Unmapped (Signed)
[facility] TRANSPLANT TELEPHONE PHARMACY NOTE  03/19/2019   Kimberly [name]  [id number]    Medication change:  1.Decrease tacrolimus to 2 mg BID today On 5/29: decrease tacrolimus to 2 mg qAM and 1 mg qPM. On 6/5 decrease tacrolimus to 1 mg BID.  Stop tacrolimus on 6/12.  2. Increase prednisone to 10 mg daily today  3. Increase Myfortic to 720 mg BID on 5/29    Education/Adherence tools provided today:  1.  provided additional education on immunosuppression and transplant related medications including reviewing indications of medications, dosing and side effects    Follow up items:  1. goal of understanding indications and dosing of immunosuppression medications     Next visit with pharmacy in 1 month  ____________________________________________________________________    Kimberly [name] is a 38 y.o. female s/p deceased kidney transplant on 12-13-2018 (Kidney) 2/2 reflux nephropathy.  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  3rd transplant failed in 2007.    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    Interval history: started transition to belatacept on 5/15    Called by pharmacy today for: medication management and adherence education; last seen by pharmacy 1 months ago. Today's visit was conducted via telephone due to the COVID-19 pandemic    CC:  Patient has no complaints today     There were no vitals filed for this visit.    Allergies   Allergen Reactions   ??? Ancef [Cefazolin] Shortness Of Breath   ??? Adhesive Tape-Silicones      Other reaction(s): Other (See Comments)  Uncoded Allergy. Allergen: plastic tape, Other Reaction: blistering   ??? Demerol [Meperidine] Nausea And Vomiting       All medications reviewed and updated.     Medication list includes revisions made during today???s encounter Outpatient Encounter Medications as of 03/19/2019   Medication Sig Dispense Refill   ??? acetaminophen (TYLENOL) 500 MG tablet Take 2 tablets (1,000 mg total) by mouth every six (6) hours as needed for pain. 100 tablet 0   ??? aspirin (ECOTRIN) 81 MG tablet Take 1 tablet (81 mg total) by mouth daily. 30 tablet 11   ??? calcitrioL (ROCALTROL) 0.25 MCG capsule Take 1 capsule (0.25 mcg total) by mouth daily. 30 capsule 11   ??? carboxymethylcellulose sodium (REFRESH CELLUVISC) 1 % DpGe Instill drops in affected eye(s) as directed as needed.     ??? midodrine (PROAMATINE) 5 MG tablet Take 3 tablets (15 mg total) by mouth Three (3) times a day. 270 tablet 11   ??? MYFORTIC 180 mg EC tablet Take 3 tablets (540 mg total) by mouth Two (2) times a day. 180 tablet 11   ??? omeprazole (PRILOSEC) 40 MG capsule Take 1 capsule (40 mg total) by mouth daily. 30 capsule 11   ??? predniSONE (DELTASONE) 5 MG tablet Take 1 tablet (5 mg total) by mouth daily. 30 tablet 11   ??? PROGRAF 1 mg capsule Take 3 capsules (3 mg total) by mouth in the morning and 2 capsules (2 mg total) nightly. 150 capsule 11   ??? sulfamethoxazole-trimethoprim (BACTRIM) 400-80 mg per tablet Take 1 tablet (80 mg of trimethoprim total) by mouth 3 (three) times a week. 12 tablet 5   ??? valGANciclovir (VALCYTE) 450 mg tablet Take 1  tablet by mouth on Mondays and Thursdays 8 tablet 2     No facility-administered encounter medications on file as of 03/19/2019.      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)   myfortic540  mg PO bid    prednisone 5 mg daily (added 3/31 due to lower tacrolimus goal)    Patient is tolerating immunosuppression well    IMMUNOSUPPRESSION DRUG LEVELS:  Lab Results   Component Value Date    Tacrolimus, Trough 6.6 12/24/2018    Tacrolimus, Trough 6.5 12/21/2018    Tacrolimus, Trough 6.5 12/17/2018    Tacrolimus, Timed 3.8 03/14/2019    Tacrolimus, Timed 6.1 03/11/2019    Tacrolimus, Timed 4.6 03/07/2019     Lab Results   Component Value Date    Cyclosporine, Trough <10 (L) 12/17/2018     No results found for: EVEROLIMUS  No results found for: SIROLIMUS    Prograf level is accurate 12 hour trough     Graft function: improving - stopped HD 2 weeks ago and has strong UO  DSA: ntd  Biopsies to date:   -3/2 no evidence of rejection. Some of the injured tubules show signs that fit into spectrum of structural CNI toxic changes  -4/28: diffuse mild to moderate ATN with some features suggestive of CNI induced toxic tubulopathy  WBC/ANC:  wnl    Plan: Increase prednisone to 10 mg today and increase Myfortic to 720 mg BID on Friday in light of belatacept transition.  Decrease tacrolimus to 2 mg BID today.  On 5/29: decrease tacrolimus to 2 mg qAM and 1 mg qPM.  On 6/5: Decrease tacrolimus to 1 mg BID.  On 6/12: stop tacrolimus.      Belatacept plan: initiation phase - 5 mg/kg 5/15, 5/29, 6/12, 6/26,  7/10, then monthly    ID prophylaxis:   CMV Status: D+/ R+, moderate risk . CMV prophylaxis: completed x 3 months per protocol (end 03/05/19)  No results found for: CMVCP  PCP Prophylaxis: bactrim SS 1 tab MWF x 6 months (end 06/05/19)  Thrush: completed in hospital  Patient is  tolerating infectious prophylaxis well    Plan: Continue per protocol.  Continue to monitor.    CV Prophylaxis: asa 81 mg   The ASCVD Risk score Denman George DC Montez Hageman, et al., 2013) failed to calculate.  Statin therapy: Not indicated due to age; currently on no statin  Plan: consider starting statin at a later date. Continue to monitor     BP: Goal < 140/90. Clinic vitals reported above  Home BP ranges:  120s/60-80s  Current meds include: none  Plan: within goal off of midodrine. Continue to monitor    Anemia:  H/H:   Lab Results   Component Value Date    HGB 8.5 (L) 03/19/2019     Lab Results   Component Value Date    HCT 25.8 (L) 03/19/2019     Iron panel:  Lab Results   Component Value Date    IRON 35 02/22/2019    TIBC 373.2 02/22/2019    FERRITIN 34.1 02/25/2019     Lab Results   Component Value Date    Iron Saturation (%) 9 (L) 02/22/2019     Prior ESA use: none post transplant    Plan: out of goal and iron stores low. Started ferrous sulfate 325 mg daily at  last visit.  Continue to monitor.     DM:   Lab Results   Component Value Date    A1C 5.1 12/05/2018   . Goal A1c < 7  History of Dm? No  Diet: did not address  Exercise:not yet  Plan:  Continue to monitor    Electrolytes: wnl  Meds currently on: none  Plan: Continue to monitor     GI/BM: pt reports no diarrhea; soft BM  Meds currently on: omeprazole 40 mg daily  Plan: Told patient to let us know if she experiences diarrhea on higher Myfortic dose. Continue to monitor    Pain: pt reports no pain   Meds currently on: APAP PRN  Plan: Continue to monitor    Bone health:   Vitamin D Level: 9.5 on 12/17/18. Goal > 30.   Last DEXA results:  none available  Current meds include: calcitriol 0.25 mcg daily  Plan: Vitamin D level  out of goal,consider increasing calcitriol to 0.5 mcg daily at next visit if Ca normalizes Continue to monitor.     Dry eye  meds currently on: hypromellose drops PRN    Women's/Men's Health:  Kimberly [name] is a 38 y.o. Female of childbearing age. Patient reports no desire for pregnancy and does not have IUD or take OC.  She's aware of pregnancy risk with Myfortic  Plan: Encouraged use of barrier method and to consider another contraceptive option. Continue to monitor    Adherence: Patient has good understanding of medications; was able to independently identify names/doses of immunosuppressants and OI meds.  Patient  does fill their own pill box on a regular basis at home.  Patient brought medication card:N/A phone visit  Pill box:N/A phone visit  Plan: provided extensive adherence counseling/intervention    Spent approximately 15 minutes on educating this patient and greater than 50% was spent in telephone counseling regarding post transplant medication education. Questions and concerns were address to patient's satisfaction.    I spent 12 minutes on the phone with the patient. I spent an additional 10 minutes on pre- and post-visit activities.     The patient was physically located in [place] or a state in which I am permitted to provide care. The patient and/or parent/gauardian understood that s/he may incur co-pays and cost sharing, and agreed to the telemedicine visit. The visit was completed via phone and/or video, which was appropriate and reasonable under the circumstances given the patient's presentation at the time.    The patient and/or parent/guardian has been advised of the potential risks and limitations of this mode of treatment (including, but not limited to, the absence of in-person examination) and has agreed to be treated using telemedicine. The patient's/patient's family's questions regarding telemedicine have been answered.     If the phone/video visit was completed in an ambulatory setting, the patient and/or parent/guardian has also been advised to contact their provider???s office for worsening conditions, and seek emergency medical treatment and/or call 911 if the patient deems either necessary.    Patient was reviewed with Dr. Nestor Lewandowsky::     During this visit, the following was completed:   BP log data assessment  Labs ordered and evaluated  complex treatment plan >1 DS   Patient education was completed for 11-24 minutes     All questions/concerns were addressed to the patient's satisfaction.  __________________________________________  Cleone Slim, PHARMD  [facility] TRANSPLANT  PAGER [phone]

## 2019-03-21 ENCOUNTER — Ambulatory Visit: Admit: 2019-03-21 | Discharge: 2019-03-22 | Payer: MEDICARE

## 2019-03-21 DIAGNOSIS — Z940 Kidney transplant status: Principal | ICD-10-CM

## 2019-03-21 LAB — COMPREHENSIVE METABOLIC PANEL
ALBUMIN: 4.1 g/dL (ref 3.5–5.0)
ALKALINE PHOSPHATASE: 74 U/L (ref 38–126)
ALT (SGPT): 12 U/L (ref <35)
ANION GAP: 9 mmol/L (ref 7–15)
AST (SGOT): 15 U/L (ref 14–38)
BILIRUBIN TOTAL: 0.3 mg/dL (ref 0.0–1.2)
BLOOD UREA NITROGEN: 38 mg/dL — ABNORMAL HIGH (ref 7–21)
BUN / CREAT RATIO: 15
CALCIUM: 9.5 mg/dL (ref 8.5–10.2)
CHLORIDE: 109 mmol/L — ABNORMAL HIGH (ref 98–107)
CO2: 21 mmol/L — ABNORMAL LOW (ref 22.0–30.0)
CREATININE: 2.51 mg/dL — ABNORMAL HIGH (ref 0.60–1.00)
EGFR CKD-EPI AA FEMALE: 27 mL/min/{1.73_m2} — ABNORMAL LOW (ref >=60)
GLUCOSE RANDOM: 87 mg/dL (ref 70–179)
POTASSIUM: 5.4 mmol/L — ABNORMAL HIGH (ref 3.5–5.0)
PROTEIN TOTAL: 6.3 g/dL — ABNORMAL LOW (ref 6.5–8.3)

## 2019-03-21 LAB — CBC W/ AUTO DIFF
BASOPHILS ABSOLUTE COUNT: 0.1 10*9/L (ref 0.0–0.1)
EOSINOPHILS ABSOLUTE COUNT: 0.1 10*9/L (ref 0.0–0.4)
EOSINOPHILS RELATIVE PERCENT: 1.8 %
HEMATOCRIT: 26.3 % — ABNORMAL LOW (ref 36.0–46.0)
HEMOGLOBIN: 8.6 g/dL — ABNORMAL LOW (ref 13.5–16.0)
LARGE UNSTAINED CELLS: 3 % (ref 0–4)
LYMPHOCYTES ABSOLUTE COUNT: 0.1 10*9/L — ABNORMAL LOW (ref 1.5–5.0)
LYMPHOCYTES RELATIVE PERCENT: 1.8 %
MEAN CORPUSCULAR HEMOGLOBIN CONC: 32.8 g/dL (ref 31.0–37.0)
MEAN CORPUSCULAR HEMOGLOBIN: 30.1 pg (ref 26.0–34.0)
MEAN CORPUSCULAR VOLUME: 91.9 fL (ref 80.0–100.0)
MEAN PLATELET VOLUME: 6.8 fL — ABNORMAL LOW (ref 7.0–10.0)
MONOCYTES ABSOLUTE COUNT: 0.6 10*9/L (ref 0.2–0.8)
MONOCYTES RELATIVE PERCENT: 9.1 %
NEUTROPHILS RELATIVE PERCENT: 83.9 %
PLATELET COUNT: 372 10*9/L (ref 150–440)
RED BLOOD CELL COUNT: 2.86 10*12/L — ABNORMAL LOW (ref 4.00–5.20)
RED CELL DISTRIBUTION WIDTH: 13.7 % (ref 12.0–15.0)
WBC ADJUSTED: 6.3 10*9/L (ref 4.5–11.0)

## 2019-03-21 LAB — NEUTROPHILS ABSOLUTE COUNT: Lab: 5.3

## 2019-03-21 LAB — MAGNESIUM: Magnesium:MCnc:Pt:Ser/Plas:Qn:: 1.3 — ABNORMAL LOW

## 2019-03-21 LAB — CHLORIDE: Chloride:SCnc:Pt:Ser/Plas:Qn:: 109 — ABNORMAL HIGH

## 2019-03-21 LAB — TACROLIMUS BLOOD: Lab: 3.3

## 2019-03-21 LAB — PHOSPHORUS: Phosphate:MCnc:Pt:Ser/Plas:Qn:: 2.4 — ABNORMAL LOW

## 2019-03-21 MED FILL — MYFORTIC 180 MG TABLET,DELAYED RELEASE: 23 days supply | Qty: 180 | Fill #0 | Status: AC

## 2019-03-21 MED FILL — SULFAMETHOXAZOLE 400 MG-TRIMETHOPRIM 80 MG TABLET: 28 days supply | Qty: 12 | Fill #3 | Status: AC

## 2019-03-21 MED FILL — CALCITRIOL 0.25 MCG CAPSULE: 30 days supply | Qty: 30 | Fill #3 | Status: AC

## 2019-03-21 MED FILL — ASPIRIN 81 MG TABLET,DELAYED RELEASE: ORAL | 30 days supply | Qty: 30 | Fill #3

## 2019-03-21 MED FILL — PROGRAF 1 MG CAPSULE: 30 days supply | Qty: 90 | Fill #0 | Status: AC

## 2019-03-21 MED FILL — OMEPRAZOLE 40 MG CAPSULE,DELAYED RELEASE: ORAL | 30 days supply | Qty: 30 | Fill #3

## 2019-03-21 MED FILL — OMEPRAZOLE 40 MG CAPSULE,DELAYED RELEASE: 30 days supply | Qty: 30 | Fill #3 | Status: AC

## 2019-03-21 MED FILL — ASPIRIN 81 MG TABLET,DELAYED RELEASE: 30 days supply | Qty: 30 | Fill #3 | Status: AC

## 2019-03-21 MED FILL — SULFAMETHOXAZOLE 400 MG-TRIMETHOPRIM 80 MG TABLET: ORAL | 28 days supply | Qty: 12 | Fill #3

## 2019-03-21 MED FILL — CALCITRIOL 0.25 MCG CAPSULE: ORAL | 30 days supply | Qty: 30 | Fill #3

## 2019-03-22 ENCOUNTER — Ambulatory Visit: Admit: 2019-03-22 | Discharge: 2019-03-23 | Payer: MEDICARE

## 2019-03-22 DIAGNOSIS — Z940 Kidney transplant status: Principal | ICD-10-CM

## 2019-03-22 NOTE — Unmapped (Signed)
1315 Patient arrived Transplant Infusion Room today for belatacept, Condition: well; Mobility: ambulating; accompanied by spouse.   See Flowsheet and MAR for all details of visit.   1317 VS stable.  1340 PIV placed, NS KVO; labs no orders found, urine no orders found.  1345 Infusion initiated.  1415 Infusion complete.  1450 VS stable, PIV removed, pt left clinic, Condition: well; Mobility: ambulating; accompanied by self.

## 2019-03-25 ENCOUNTER — Ambulatory Visit: Admit: 2019-03-25 | Discharge: 2019-03-26 | Payer: MEDICARE

## 2019-03-25 DIAGNOSIS — Z940 Kidney transplant status: Principal | ICD-10-CM

## 2019-03-25 LAB — COMPREHENSIVE METABOLIC PANEL
ALKALINE PHOSPHATASE: 70 U/L (ref 38–126)
ALT (SGPT): 17 U/L (ref <35)
ANION GAP: 9 mmol/L (ref 7–15)
AST (SGOT): 14 U/L (ref 14–38)
BILIRUBIN TOTAL: 0.3 mg/dL (ref 0.0–1.2)
BLOOD UREA NITROGEN: 34 mg/dL — ABNORMAL HIGH (ref 7–21)
BUN / CREAT RATIO: 15
CALCIUM: 9.8 mg/dL (ref 8.5–10.2)
CHLORIDE: 109 mmol/L — ABNORMAL HIGH (ref 98–107)
CO2: 21 mmol/L — ABNORMAL LOW (ref 22.0–30.0)
CREATININE: 2.29 mg/dL — ABNORMAL HIGH (ref 0.60–1.00)
EGFR CKD-EPI AA FEMALE: 30 mL/min/{1.73_m2} — ABNORMAL LOW (ref >=60)
EGFR CKD-EPI NON-AA FEMALE: 26 mL/min/{1.73_m2} — ABNORMAL LOW (ref >=60)
GLUCOSE RANDOM: 86 mg/dL (ref 70–179)
POTASSIUM: 4.8 mmol/L (ref 3.5–5.0)
PROTEIN TOTAL: 6.3 g/dL — ABNORMAL LOW (ref 6.5–8.3)

## 2019-03-25 LAB — CBC W/ AUTO DIFF
BASOPHILS ABSOLUTE COUNT: 0.1 10*9/L (ref 0.0–0.1)
BASOPHILS RELATIVE PERCENT: 0.6 %
EOSINOPHILS ABSOLUTE COUNT: 0.2 10*9/L (ref 0.0–0.4)
EOSINOPHILS RELATIVE PERCENT: 2.1 %
HEMATOCRIT: 26.1 % — ABNORMAL LOW (ref 36.0–46.0)
HEMOGLOBIN: 8.7 g/dL — ABNORMAL LOW (ref 13.5–16.0)
LARGE UNSTAINED CELLS: 2 % (ref 0–4)
LYMPHOCYTES ABSOLUTE COUNT: 0.2 10*9/L — ABNORMAL LOW (ref 1.5–5.0)
LYMPHOCYTES RELATIVE PERCENT: 1.6 %
MEAN CORPUSCULAR HEMOGLOBIN: 30.7 pg (ref 26.0–34.0)
MEAN CORPUSCULAR VOLUME: 92.3 fL (ref 80.0–100.0)
MEAN PLATELET VOLUME: 6.6 fL — ABNORMAL LOW (ref 7.0–10.0)
MONOCYTES ABSOLUTE COUNT: 0.6 10*9/L (ref 0.2–0.8)
MONOCYTES RELATIVE PERCENT: 6.2 %
NEUTROPHILS ABSOLUTE COUNT: 8.8 10*9/L — ABNORMAL HIGH (ref 2.0–7.5)
NEUTROPHILS RELATIVE PERCENT: 87.5 %
PLATELET COUNT: 397 10*9/L (ref 150–440)
WBC ADJUSTED: 10 10*9/L (ref 4.5–11.0)

## 2019-03-25 LAB — MAGNESIUM: Magnesium:MCnc:Pt:Ser/Plas:Qn:: 1.3 — ABNORMAL LOW

## 2019-03-25 LAB — TACROLIMUS BLOOD: Lab: 1

## 2019-03-25 LAB — MEAN PLATELET VOLUME: Lab: 6.6 — ABNORMAL LOW

## 2019-03-25 LAB — BILIRUBIN TOTAL: Bilirubin:MCnc:Pt:Ser/Plas:Qn:: 0.3

## 2019-03-25 LAB — PHOSPHORUS: Phosphate:MCnc:Pt:Ser/Plas:Qn:: 2.2 — ABNORMAL LOW

## 2019-03-28 ENCOUNTER — Ambulatory Visit: Admit: 2019-03-28 | Discharge: 2019-03-29 | Payer: MEDICARE

## 2019-03-28 DIAGNOSIS — Z940 Kidney transplant status: Principal | ICD-10-CM

## 2019-03-28 LAB — PHOSPHORUS: Phosphate:MCnc:Pt:Ser/Plas:Qn:: 2.8 — ABNORMAL LOW

## 2019-03-28 LAB — CBC W/ AUTO DIFF
BASOPHILS RELATIVE PERCENT: 0.7 %
EOSINOPHILS ABSOLUTE COUNT: 0.3 10*9/L (ref 0.0–0.4)
EOSINOPHILS RELATIVE PERCENT: 2.2 %
HEMATOCRIT: 26.7 % — ABNORMAL LOW (ref 36.0–46.0)
HEMOGLOBIN: 8.8 g/dL — ABNORMAL LOW (ref 13.5–16.0)
LARGE UNSTAINED CELLS: 2 % (ref 0–4)
LYMPHOCYTES ABSOLUTE COUNT: 0.2 10*9/L — ABNORMAL LOW (ref 1.5–5.0)
LYMPHOCYTES RELATIVE PERCENT: 1.8 %
MEAN CORPUSCULAR HEMOGLOBIN CONC: 33 g/dL (ref 31.0–37.0)
MEAN CORPUSCULAR HEMOGLOBIN: 30.4 pg (ref 26.0–34.0)
MONOCYTES ABSOLUTE COUNT: 0.7 10*9/L (ref 0.2–0.8)
MONOCYTES RELATIVE PERCENT: 6.1 %
NEUTROPHILS RELATIVE PERCENT: 87.4 %
PLATELET COUNT: 406 10*9/L (ref 150–440)
RED BLOOD CELL COUNT: 2.91 10*12/L — ABNORMAL LOW (ref 4.00–5.20)
RED CELL DISTRIBUTION WIDTH: 13.8 % (ref 12.0–15.0)
WBC ADJUSTED: 11.9 10*9/L — ABNORMAL HIGH (ref 4.5–11.0)

## 2019-03-28 LAB — COMPREHENSIVE METABOLIC PANEL
ALBUMIN: 4.2 g/dL (ref 3.5–5.0)
ALKALINE PHOSPHATASE: 74 U/L (ref 38–126)
ALT (SGPT): 15 U/L (ref <35)
ANION GAP: 9 mmol/L (ref 7–15)
AST (SGOT): 14 U/L (ref 14–38)
BILIRUBIN TOTAL: 0.2 mg/dL (ref 0.0–1.2)
BLOOD UREA NITROGEN: 43 mg/dL — ABNORMAL HIGH (ref 7–21)
CALCIUM: 10 mg/dL (ref 8.5–10.2)
CHLORIDE: 110 mmol/L — ABNORMAL HIGH (ref 98–107)
CO2: 20 mmol/L — ABNORMAL LOW (ref 22.0–30.0)
CREATININE: 2.32 mg/dL — ABNORMAL HIGH (ref 0.60–1.00)
EGFR CKD-EPI AA FEMALE: 30 mL/min/{1.73_m2} — ABNORMAL LOW (ref >=60)
GLUCOSE RANDOM: 87 mg/dL (ref 70–179)
POTASSIUM: 5 mmol/L (ref 3.5–5.0)
PROTEIN TOTAL: 6.3 g/dL — ABNORMAL LOW (ref 6.5–8.3)
SODIUM: 139 mmol/L (ref 135–145)

## 2019-03-28 LAB — LARGE UNSTAINED CELLS: Lab: 2

## 2019-03-28 LAB — BUN / CREAT RATIO: Urea nitrogen/Creatinine:MRto:Pt:Ser/Plas:Qn:: 19

## 2019-03-28 LAB — TACROLIMUS BLOOD: Lab: 2.1

## 2019-03-28 LAB — MAGNESIUM: Magnesium:MCnc:Pt:Ser/Plas:Qn:: 1.5 — ABNORMAL LOW

## 2019-04-01 ENCOUNTER — Ambulatory Visit: Admit: 2019-04-01 | Discharge: 2019-04-02 | Payer: MEDICARE

## 2019-04-01 DIAGNOSIS — Z940 Kidney transplant status: Principal | ICD-10-CM

## 2019-04-01 LAB — EOSINOPHILS ABSOLUTE COUNT: Lab: 0.2

## 2019-04-01 LAB — CBC W/ AUTO DIFF
BASOPHILS ABSOLUTE COUNT: 0 10*9/L (ref 0.0–0.1)
BASOPHILS RELATIVE PERCENT: 0.4 %
EOSINOPHILS ABSOLUTE COUNT: 0.2 10*9/L (ref 0.0–0.4)
EOSINOPHILS RELATIVE PERCENT: 1.7 %
HEMOGLOBIN: 8.8 g/dL — ABNORMAL LOW (ref 13.5–16.0)
LARGE UNSTAINED CELLS: 2 % (ref 0–4)
LYMPHOCYTES ABSOLUTE COUNT: 0.1 10*9/L — ABNORMAL LOW (ref 1.5–5.0)
LYMPHOCYTES RELATIVE PERCENT: 0.7 %
MEAN CORPUSCULAR HEMOGLOBIN CONC: 33.1 g/dL (ref 31.0–37.0)
MEAN CORPUSCULAR HEMOGLOBIN: 30.1 pg (ref 26.0–34.0)
MEAN PLATELET VOLUME: 6.4 fL — ABNORMAL LOW (ref 7.0–10.0)
MONOCYTES ABSOLUTE COUNT: 0.5 10*9/L (ref 0.2–0.8)
MONOCYTES RELATIVE PERCENT: 5.4 %
NEUTROPHILS ABSOLUTE COUNT: 9 10*9/L — ABNORMAL HIGH (ref 2.0–7.5)
PLATELET COUNT: 387 10*9/L (ref 150–440)
RED BLOOD CELL COUNT: 2.91 10*12/L — ABNORMAL LOW (ref 4.00–5.20)
RED CELL DISTRIBUTION WIDTH: 14 % (ref 12.0–15.0)
WBC ADJUSTED: 10 10*9/L (ref 4.5–11.0)

## 2019-04-01 LAB — COMPREHENSIVE METABOLIC PANEL
ALBUMIN: 4.3 g/dL (ref 3.5–5.0)
ALKALINE PHOSPHATASE: 78 U/L (ref 38–126)
ANION GAP: 10 mmol/L (ref 7–15)
AST (SGOT): 15 U/L (ref 14–38)
BILIRUBIN TOTAL: 0.3 mg/dL (ref 0.0–1.2)
BLOOD UREA NITROGEN: 43 mg/dL — ABNORMAL HIGH (ref 7–21)
BUN / CREAT RATIO: 19
CHLORIDE: 110 mmol/L — ABNORMAL HIGH (ref 98–107)
CO2: 19 mmol/L — ABNORMAL LOW (ref 22.0–30.0)
CREATININE: 2.26 mg/dL — ABNORMAL HIGH (ref 0.60–1.00)
EGFR CKD-EPI AA FEMALE: 31 mL/min/{1.73_m2} — ABNORMAL LOW (ref >=60)
EGFR CKD-EPI NON-AA FEMALE: 27 mL/min/{1.73_m2} — ABNORMAL LOW (ref >=60)
GLUCOSE RANDOM: 92 mg/dL (ref 70–179)
POTASSIUM: 4.8 mmol/L (ref 3.5–5.0)
PROTEIN TOTAL: 6.5 g/dL (ref 6.5–8.3)
SODIUM: 139 mmol/L (ref 135–145)

## 2019-04-01 LAB — TACROLIMUS BLOOD: Lab: 1.2

## 2019-04-01 LAB — PHOSPHORUS: Phosphate:MCnc:Pt:Ser/Plas:Qn:: 2.8 — ABNORMAL LOW

## 2019-04-01 LAB — MAGNESIUM: Magnesium:MCnc:Pt:Ser/Plas:Qn:: 1.6

## 2019-04-01 LAB — BILIRUBIN TOTAL: Bilirubin:MCnc:Pt:Ser/Plas:Qn:: 0.3

## 2019-04-04 ENCOUNTER — Ambulatory Visit: Admit: 2019-04-04 | Discharge: 2019-04-05 | Payer: MEDICARE

## 2019-04-04 DIAGNOSIS — Z940 Kidney transplant status: Principal | ICD-10-CM

## 2019-04-04 LAB — CBC W/ AUTO DIFF
BASOPHILS ABSOLUTE COUNT: 0.1 10*9/L (ref 0.0–0.1)
BASOPHILS RELATIVE PERCENT: 0.6 %
EOSINOPHILS ABSOLUTE COUNT: 0.2 10*9/L (ref 0.0–0.4)
EOSINOPHILS RELATIVE PERCENT: 2.1 %
HEMATOCRIT: 26.4 % — ABNORMAL LOW (ref 36.0–46.0)
HEMOGLOBIN: 8.7 g/dL — ABNORMAL LOW (ref 13.5–16.0)
LARGE UNSTAINED CELLS: 1 % (ref 0–4)
LYMPHOCYTES ABSOLUTE COUNT: 0.1 10*9/L — ABNORMAL LOW (ref 1.5–5.0)
LYMPHOCYTES RELATIVE PERCENT: 1.2 %
MEAN CORPUSCULAR HEMOGLOBIN CONC: 33 g/dL (ref 31.0–37.0)
MEAN CORPUSCULAR HEMOGLOBIN: 29.9 pg (ref 26.0–34.0)
MEAN CORPUSCULAR VOLUME: 90.7 fL (ref 80.0–100.0)
MEAN PLATELET VOLUME: 6.8 fL — ABNORMAL LOW (ref 7.0–10.0)
MONOCYTES ABSOLUTE COUNT: 0.4 10*9/L (ref 0.2–0.8)
MONOCYTES RELATIVE PERCENT: 4.8 %
NEUTROPHILS ABSOLUTE COUNT: 7.5 10*9/L (ref 2.0–7.5)
NEUTROPHILS RELATIVE PERCENT: 89.9 %
PLATELET COUNT: 373 10*9/L (ref 150–440)
RED BLOOD CELL COUNT: 2.91 10*12/L — ABNORMAL LOW (ref 4.00–5.20)
WBC ADJUSTED: 8.4 10*9/L (ref 4.5–11.0)

## 2019-04-04 LAB — COMPREHENSIVE METABOLIC PANEL
ALBUMIN: 4.1 g/dL (ref 3.5–5.0)
ALKALINE PHOSPHATASE: 75 U/L (ref 38–126)
ALT (SGPT): 15 U/L (ref <35)
ANION GAP: 10 mmol/L (ref 7–15)
AST (SGOT): 13 U/L — ABNORMAL LOW (ref 14–38)
BILIRUBIN TOTAL: 0.3 mg/dL (ref 0.0–1.2)
BLOOD UREA NITROGEN: 45 mg/dL — ABNORMAL HIGH (ref 7–21)
BUN / CREAT RATIO: 19
CALCIUM: 9.5 mg/dL (ref 8.5–10.2)
CHLORIDE: 113 mmol/L — ABNORMAL HIGH (ref 98–107)
CO2: 17 mmol/L — ABNORMAL LOW (ref 22.0–30.0)
CREATININE: 2.38 mg/dL — ABNORMAL HIGH (ref 0.60–1.00)
EGFR CKD-EPI AA FEMALE: 29 mL/min/{1.73_m2} — ABNORMAL LOW (ref >=60)
EGFR CKD-EPI NON-AA FEMALE: 25 mL/min/{1.73_m2} — ABNORMAL LOW (ref >=60)
GLUCOSE RANDOM: 94 mg/dL (ref 70–179)
POTASSIUM: 4.4 mmol/L (ref 3.5–5.0)
PROTEIN TOTAL: 6.3 g/dL — ABNORMAL LOW (ref 6.5–8.3)

## 2019-04-04 LAB — MAGNESIUM: Magnesium:MCnc:Pt:Ser/Plas:Qn:: 1.6

## 2019-04-04 LAB — PHOSPHORUS: Phosphate:MCnc:Pt:Ser/Plas:Qn:: 2.7 — ABNORMAL LOW

## 2019-04-04 LAB — LYMPHOCYTES RELATIVE PERCENT: Lab: 1.2

## 2019-04-04 LAB — TACROLIMUS LEVEL: TACROLIMUS BLOOD: 1.1 ng/mL

## 2019-04-04 LAB — TACROLIMUS BLOOD: Lab: 1.1

## 2019-04-04 LAB — ALBUMIN: Albumin:MCnc:Pt:Ser/Plas:Qn:: 4.1

## 2019-04-05 ENCOUNTER — Ambulatory Visit: Admit: 2019-04-05 | Discharge: 2019-04-06 | Payer: MEDICARE

## 2019-04-05 DIAGNOSIS — Z940 Kidney transplant status: Principal | ICD-10-CM

## 2019-04-05 NOTE — Unmapped (Signed)
1312: Patient arrived Transplant Infusion Room today for belatacept, Condition: well; Mobility: ambulating; accompanied by spouse.   1313: VS stable.  1320: PIV placed, NS KVO; labs no orders found, urine no orders found.  1321: Infusion initiated.  1351: Infusion complete.  1400: VS stable, PIV removed, pt left clinic, Condition: well; Mobility: ambulating; accompanied by self.  See Flowsheet and MAR for all details of visit.

## 2019-04-08 ENCOUNTER — Ambulatory Visit: Admit: 2019-04-08 | Discharge: 2019-04-09 | Payer: MEDICARE

## 2019-04-08 DIAGNOSIS — Z940 Kidney transplant status: Principal | ICD-10-CM

## 2019-04-08 LAB — COMPREHENSIVE METABOLIC PANEL
ALBUMIN: 3.8 g/dL (ref 3.5–5.0)
ALKALINE PHOSPHATASE: 70 U/L (ref 38–126)
ALT (SGPT): 28 U/L (ref <35)
ANION GAP: 8 mmol/L (ref 7–15)
AST (SGOT): 19 U/L (ref 14–38)
BILIRUBIN TOTAL: 0.4 mg/dL (ref 0.0–1.2)
BLOOD UREA NITROGEN: 34 mg/dL — ABNORMAL HIGH (ref 7–21)
BUN / CREAT RATIO: 16
CHLORIDE: 110 mmol/L — ABNORMAL HIGH (ref 98–107)
CO2: 21 mmol/L — ABNORMAL LOW (ref 22.0–30.0)
CREATININE: 2.11 mg/dL — ABNORMAL HIGH (ref 0.60–1.00)
EGFR CKD-EPI AA FEMALE: 33 mL/min/{1.73_m2} — ABNORMAL LOW (ref >=60)
EGFR CKD-EPI NON-AA FEMALE: 29 mL/min/{1.73_m2} — ABNORMAL LOW (ref >=60)
GLUCOSE RANDOM: 91 mg/dL (ref 70–179)
POTASSIUM: 4.2 mmol/L (ref 3.5–5.0)
PROTEIN TOTAL: 6.1 g/dL — ABNORMAL LOW (ref 6.5–8.3)
SODIUM: 139 mmol/L (ref 135–145)

## 2019-04-08 LAB — CBC W/ AUTO DIFF
BASOPHILS ABSOLUTE COUNT: 0 10*9/L (ref 0.0–0.1)
BASOPHILS RELATIVE PERCENT: 0.5 %
EOSINOPHILS ABSOLUTE COUNT: 0.1 10*9/L (ref 0.0–0.4)
EOSINOPHILS RELATIVE PERCENT: 1.8 %
HEMATOCRIT: 24.9 % — ABNORMAL LOW (ref 36.0–46.0)
HEMOGLOBIN: 8.3 g/dL — ABNORMAL LOW (ref 13.5–16.0)
LARGE UNSTAINED CELLS: 2 % (ref 0–4)
LYMPHOCYTES ABSOLUTE COUNT: 0.1 10*9/L — ABNORMAL LOW (ref 1.5–5.0)
LYMPHOCYTES RELATIVE PERCENT: 1.2 %
MEAN CORPUSCULAR HEMOGLOBIN: 29.7 pg (ref 26.0–34.0)
MEAN CORPUSCULAR VOLUME: 89.1 fL (ref 80.0–100.0)
MEAN PLATELET VOLUME: 7.1 fL (ref 7.0–10.0)
MONOCYTES ABSOLUTE COUNT: 0.2 10*9/L (ref 0.2–0.8)
MONOCYTES RELATIVE PERCENT: 3.3 %
NEUTROPHILS ABSOLUTE COUNT: 5.1 10*9/L (ref 2.0–7.5)
PLATELET COUNT: 283 10*9/L (ref 150–440)
RED BLOOD CELL COUNT: 2.79 10*12/L — ABNORMAL LOW (ref 4.00–5.20)
RED CELL DISTRIBUTION WIDTH: 13.9 % (ref 12.0–15.0)
WBC ADJUSTED: 5.6 10*9/L (ref 4.5–11.0)

## 2019-04-08 LAB — LYMPHOCYTES ABSOLUTE COUNT: Lab: 0.1 — ABNORMAL LOW

## 2019-04-08 LAB — PHOSPHORUS: Phosphate:MCnc:Pt:Ser/Plas:Qn:: 2.1 — ABNORMAL LOW

## 2019-04-08 LAB — ALKALINE PHOSPHATASE: Alkaline phosphatase:CCnc:Pt:Ser/Plas:Qn:: 70

## 2019-04-08 LAB — MAGNESIUM: Magnesium:MCnc:Pt:Ser/Plas:Qn:: 1.6

## 2019-04-08 LAB — TACROLIMUS BLOOD: Lab: <1

## 2019-04-11 ENCOUNTER — Ambulatory Visit: Admit: 2019-04-11 | Discharge: 2019-04-12 | Payer: MEDICARE

## 2019-04-11 DIAGNOSIS — Z940 Kidney transplant status: Principal | ICD-10-CM

## 2019-04-11 LAB — CBC W/ AUTO DIFF
BASOPHILS ABSOLUTE COUNT: 0 10*9/L (ref 0.0–0.1)
BASOPHILS RELATIVE PERCENT: 0.5 %
EOSINOPHILS ABSOLUTE COUNT: 0.1 10*9/L (ref 0.0–0.4)
EOSINOPHILS RELATIVE PERCENT: 2.4 %
HEMATOCRIT: 24 % — ABNORMAL LOW (ref 36.0–46.0)
HEMOGLOBIN: 7.9 g/dL — ABNORMAL LOW (ref 13.5–16.0)
LARGE UNSTAINED CELLS: 2 % (ref 0–4)
LYMPHOCYTES RELATIVE PERCENT: 1.9 %
MEAN CORPUSCULAR HEMOGLOBIN CONC: 33.1 g/dL (ref 31.0–37.0)
MEAN CORPUSCULAR HEMOGLOBIN: 29.4 pg (ref 26.0–34.0)
MEAN CORPUSCULAR VOLUME: 88.8 fL (ref 80.0–100.0)
MEAN PLATELET VOLUME: 6.8 fL — ABNORMAL LOW (ref 7.0–10.0)
MONOCYTES ABSOLUTE COUNT: 0.2 10*9/L (ref 0.2–0.8)
MONOCYTES RELATIVE PERCENT: 3.2 %
NEUTROPHILS ABSOLUTE COUNT: 5 10*9/L (ref 2.0–7.5)
NEUTROPHILS RELATIVE PERCENT: 89.6 %
PLATELET COUNT: 259 10*9/L (ref 150–440)
RED BLOOD CELL COUNT: 2.7 10*12/L — ABNORMAL LOW (ref 4.00–5.20)
RED CELL DISTRIBUTION WIDTH: 14.4 % (ref 12.0–15.0)
WBC ADJUSTED: 5.6 10*9/L (ref 4.5–11.0)

## 2019-04-11 LAB — COMPREHENSIVE METABOLIC PANEL
ALBUMIN: 3.8 g/dL (ref 3.5–5.0)
ALKALINE PHOSPHATASE: 68 U/L (ref 38–126)
ALT (SGPT): 27 U/L (ref <35)
ANION GAP: 7 mmol/L (ref 7–15)
AST (SGOT): 18 U/L (ref 14–38)
BILIRUBIN TOTAL: 0.5 mg/dL (ref 0.0–1.2)
BLOOD UREA NITROGEN: 27 mg/dL — ABNORMAL HIGH (ref 7–21)
BUN / CREAT RATIO: 13
CHLORIDE: 112 mmol/L — ABNORMAL HIGH (ref 98–107)
CO2: 20 mmol/L — ABNORMAL LOW (ref 22.0–30.0)
CREATININE: 2.1 mg/dL — ABNORMAL HIGH (ref 0.60–1.00)
EGFR CKD-EPI AA FEMALE: 34 mL/min/{1.73_m2} — ABNORMAL LOW (ref >=60)
EGFR CKD-EPI NON-AA FEMALE: 29 mL/min/{1.73_m2} — ABNORMAL LOW (ref >=60)
GLUCOSE RANDOM: 99 mg/dL (ref 70–179)
POTASSIUM: 4.1 mmol/L (ref 3.5–5.0)
PROTEIN TOTAL: 6 g/dL — ABNORMAL LOW (ref 6.5–8.3)
SODIUM: 139 mmol/L (ref 135–145)

## 2019-04-11 LAB — MAGNESIUM
MAGNESIUM: 1.7 mg/dL (ref 1.6–2.2)
Magnesium:MCnc:Pt:Ser/Plas:Qn:: 1.7

## 2019-04-11 LAB — PHOSPHORUS
PHOSPHORUS: 1.9 mg/dL — ABNORMAL LOW (ref 2.9–4.7)
Phosphate:MCnc:Pt:Ser/Plas:Qn:: 1.9 — ABNORMAL LOW

## 2019-04-11 LAB — LYMPHOCYTES ABSOLUTE COUNT: Lab: 0.1 — ABNORMAL LOW

## 2019-04-11 LAB — POTASSIUM: Potassium:SCnc:Pt:Ser/Plas:Qn:: 4.1

## 2019-04-12 MED ORDER — TACROLIMUS 1 MG CAPSULE
ORAL_CAPSULE | ORAL | 11 refills | 0 days | Status: CP
Start: 2019-04-12 — End: 2019-05-17

## 2019-04-15 ENCOUNTER — Ambulatory Visit: Admit: 2019-04-15 | Discharge: 2019-04-16 | Payer: MEDICARE

## 2019-04-15 DIAGNOSIS — Z940 Kidney transplant status: Principal | ICD-10-CM

## 2019-04-15 LAB — CBC W/ AUTO DIFF
BASOPHILS RELATIVE PERCENT: 0.8 %
EOSINOPHILS ABSOLUTE COUNT: 0.1 10*9/L (ref 0.0–0.4)
EOSINOPHILS RELATIVE PERCENT: 1.8 %
HEMATOCRIT: 22.7 % — ABNORMAL LOW (ref 36.0–46.0)
LARGE UNSTAINED CELLS: 2 % (ref 0–4)
LYMPHOCYTES ABSOLUTE COUNT: 0.1 10*9/L — ABNORMAL LOW (ref 1.5–5.0)
LYMPHOCYTES RELATIVE PERCENT: 2.5 %
MEAN CORPUSCULAR HEMOGLOBIN CONC: 33.7 g/dL (ref 31.0–37.0)
MEAN CORPUSCULAR HEMOGLOBIN: 30.1 pg (ref 26.0–34.0)
MEAN CORPUSCULAR VOLUME: 89.4 fL (ref 80.0–100.0)
MEAN PLATELET VOLUME: 6.7 fL — ABNORMAL LOW (ref 7.0–10.0)
MONOCYTES ABSOLUTE COUNT: 0.1 10*9/L — ABNORMAL LOW (ref 0.2–0.8)
MONOCYTES RELATIVE PERCENT: 2.8 %
NEUTROPHILS ABSOLUTE COUNT: 4.5 10*9/L (ref 2.0–7.5)
NEUTROPHILS RELATIVE PERCENT: 90.1 %
PLATELET COUNT: 293 10*9/L (ref 150–440)
RED BLOOD CELL COUNT: 2.54 10*12/L — ABNORMAL LOW (ref 4.00–5.20)
RED CELL DISTRIBUTION WIDTH: 14.6 % (ref 12.0–15.0)
WBC ADJUSTED: 5 10*9/L (ref 4.5–11.0)

## 2019-04-15 LAB — COMPREHENSIVE METABOLIC PANEL
ALBUMIN: 3.6 g/dL (ref 3.5–5.0)
ALKALINE PHOSPHATASE: 76 U/L (ref 38–126)
ALT (SGPT): 33 U/L (ref <35)
ANION GAP: 6 mmol/L — ABNORMAL LOW (ref 7–15)
AST (SGOT): 22 U/L (ref 14–38)
BILIRUBIN TOTAL: 0.5 mg/dL (ref 0.0–1.2)
BLOOD UREA NITROGEN: 30 mg/dL — ABNORMAL HIGH (ref 7–21)
BUN / CREAT RATIO: 15
CALCIUM: 9.3 mg/dL (ref 8.5–10.2)
CHLORIDE: 113 mmol/L — ABNORMAL HIGH (ref 98–107)
CO2: 19 mmol/L — ABNORMAL LOW (ref 22.0–30.0)
CREATININE: 2.06 mg/dL — ABNORMAL HIGH (ref 0.60–1.00)
EGFR CKD-EPI AA FEMALE: 34 mL/min/{1.73_m2} — ABNORMAL LOW (ref >=60)
EGFR CKD-EPI NON-AA FEMALE: 30 mL/min/{1.73_m2} — ABNORMAL LOW (ref >=60)
GLUCOSE RANDOM: 100 mg/dL (ref 70–179)
POTASSIUM: 4.1 mmol/L (ref 3.5–5.0)
PROTEIN TOTAL: 5.8 g/dL — ABNORMAL LOW (ref 6.5–8.3)
SODIUM: 138 mmol/L (ref 135–145)

## 2019-04-15 LAB — WBC ADJUSTED: Lab: 5

## 2019-04-15 LAB — MAGNESIUM: Magnesium:MCnc:Pt:Ser/Plas:Qn:: 1.8

## 2019-04-15 LAB — PHOSPHORUS: Phosphate:MCnc:Pt:Ser/Plas:Qn:: 2.1 — ABNORMAL LOW

## 2019-04-15 LAB — ALBUMIN: Albumin:MCnc:Pt:Ser/Plas:Qn:: 3.6

## 2019-04-15 NOTE — Unmapped (Signed)
[facility] Clinical Assessment & Refill Coordination Note    Kimberly [name], DOB: March 30, 1981  Phone: [phone] (home)     All above HIPAA information was verified with patient.     Specialty Medication(s):   Transplant: Myfortic 180mg , Prograf 1mg  and Prednisone 5mg      Current Outpatient Medications   Medication Sig Dispense Refill   ??? acetaminophen (TYLENOL) 500 MG tablet Take 2 tablets (1,000 mg total) by mouth every six (6) hours as needed for pain. 100 tablet 0   ??? aspirin (ECOTRIN) 81 MG tablet Take 1 tablet (81 mg total) by mouth daily. 30 tablet 11   ??? calcitrioL (ROCALTROL) 0.25 MCG capsule Take 1 capsule (0.25 mcg total) by mouth daily. 30 capsule 11   ??? carboxymethylcellulose sodium (REFRESH CELLUVISC) 1 % DpGe Instill drops in affected eye(s) as directed as needed.     ??? ferrous sulfate 325 (65 FE) MG tablet Take 325 mg by mouth daily.     ??? MYFORTIC 180 mg EC tablet Take 4 tablets (720 mg total) by mouth Two (2) times a day. 180 tablet 11   ??? omeprazole (PRILOSEC) 40 MG capsule Take 1 capsule (40 mg total) by mouth daily. 30 capsule 11   ??? predniSONE (DELTASONE) 5 MG tablet Take 2 tablets (10 mg total) by mouth daily. 30 tablet 11   ??? sulfamethoxazole-trimethoprim (BACTRIM) 400-80 mg per tablet Take 1 tablet (80 mg of trimethoprim total) by mouth 3 (three) times a week. 12 tablet 5   ??? tacrolimus (PROGRAF) 1 MG capsule Take 2 capsules (2 mg total) by mouth every morning and 1 capsule (1 mg total) nightly. 90 capsule 11     No current facility-administered medications for this visit.         Changes to medications: Kimberly [name] Reports stopping the following medications: Prograf 1mg     Allergies   Allergen Reactions   ??? Ancef [Cefazolin] Shortness Of Breath   ??? Adhesive Tape-Silicones      Other reaction(s): Other (See Comments)  Uncoded Allergy. Allergen: plastic tape, Other Reaction: blistering   ??? Demerol [Meperidine] Nausea And Vomiting       Changes to allergies: No    SPECIALTY MEDICATION ADHERENCE     Prograf 1 mg: medication has been discontinued days of medicine on hand   Myfortic 180 mg: 7 days of medicine on hand   Prednisone 5 mg: 7 days of medicine on hand       Medication Adherence    Patient reported X missed doses in the last month:  0  Specialty Medication:  Myfortic 180mg   Patient is on additional specialty medications:  Yes  Additional Specialty Medications:  Prednisone 5mg   Patient Reported Additional Medication X Missed Doses in the Last Month:  0  Patient is on more than two specialty medications:  Yes  Specialty Medication:  Prograf 1mg   Patient Reported Additional Medication X Missed Doses in the Last Month:  0          Specialty medication(s) dose(s) confirmed: Regimen is correct and unchanged.     Are there any concerns with adherence? No    Adherence counseling provided? Not needed    CLINICAL MANAGEMENT AND INTERVENTION      Clinical Benefit Assessment:    Do you feel the medicine is effective or helping your condition? Yes    Clinical Benefit counseling provided? Not needed    Adverse Effects Assessment:    Are you experiencing any side  effects? No    Are you experiencing difficulty administering your medicine? No    Quality of Life Assessment:    How many days over the past month did your kidney transplant  keep you from your normal activities? For example, brushing your teeth or getting up in the morning. 0    Have you discussed this with your provider? Not needed    Therapy Appropriateness:    Is therapy appropriate? Yes, therapy is appropriate and should be continued    DISEASE/MEDICATION-SPECIFIC INFORMATION      N/A    PATIENT SPECIFIC NEEDS     ? Does the patient have any physical, cognitive, or cultural barriers? No    ? Is the patient high risk? Yes, patient taking a REMS drug     ? Does the patient require a Care Management Plan? No     ? Does the patient require physician intervention or other additional services (i.e. nutrition, smoking cessation, social work)? No      SHIPPING     Specialty Medication(s) to be Shipped:   Transplant: Myfortic 180mg  and Prednisone 5mg     Other medication(s) to be shipped: Calcitriol, Omeprazole, SMZ-TMP     Changes to insurance: No    Delivery Scheduled: Yes, Expected medication delivery date: 04/18/2019.     Medication will be delivered via UPS to the confirmed home address in [facility].    The patient will receive a drug information handout for each medication shipped and additional FDA Medication Guides as required.  Verified that patient has previously received a Conservation officer, historic buildings.    All of the patient's questions and concerns have been addressed.    Tera Helper   [facility] Specialty Pharmacist

## 2019-04-16 ENCOUNTER — Telehealth: Admit: 2019-04-16 | Discharge: 2019-04-17 | Payer: MEDICARE | Attending: Nephrology | Primary: Nephrology

## 2019-04-16 NOTE — Unmapped (Signed)
Pt advised to take 6,000 units of Epogen. Last HgB 7.7.

## 2019-04-16 NOTE — Unmapped (Signed)
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  2nd kidney transplant - 2000-2002 - DD failed chronic rejection.  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 Her last dialysis was on 03/07/19.  Has a good U.O.    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    Creatinine - value 2.06, improving  Urine analysis: protein 100 mg/dl / WBC >161 / RBC <1 (0/9604)  Urine protein/creatinine ratio: 0.746 (12/21/18)  DSA: negative - 12/24/18    Last CMV checked: not detected - 12/17/18  Last BKV checked: not checked    Prophylaxis -  Bactrim 80 mg TIW (until 06/05/19)    2) Immunosuppression: Myfortic 720 mg BID,  Prednisone 10 mg, Belatacept monthly  - Changes in Immunosuppression - None today - on belatacept due to biopsy findings.  - Medications side effects: No    3) Hypotension - Controlled -off midodrine now.  Goal for B.P > 100 systolic.  Changes in B.P medications - none.    4) Anemia - stable - Hgb 7.7   Goal for Hemoglobin > 11.0  Ferritin 34.1 (02/25/19)  - continue iron supplement TID.  - epogen 6000 units s.c Q weekly.     5) MBD - Calcium 9.5 / Phosphorus 2.1 - Secondary hyperparathyroidism  On calcitriol 0.25 mcg daily  iPTH - 73.8 (12/05/18)    6) Electrolytes: WNL, low bicarbonate - will monitor for now.    7) Immunizations:  Influenza (inactivated only): 08/09/18  Pneumococcal vaccination (inactivated only):     8) Cancer screening:  PAP smear - 3/15 negative   Mammogram - Age 59.    Follow up: 4 weeks.    I spent 7 minutes on the audio/video with the patient. I spent an additional 10 minutes on pre- and post-visit activities.     The patient was physically located in [place] or a state in which I am permitted to provide care. The patient and/or parent/gauardian understood that s/he may incur co-pays and cost sharing, and agreed to the telemedicine visit. The visit was completed via phone and/or video, which was appropriate and reasonable under the circumstances given the patient's presentation at the time.    The patient and/or parent/guardian has been advised of the potential risks and limitations of this mode of treatment (including, but not limited to, the absence of in-person examination) and has agreed to be treated using telemedicine. The patient's/patient's family's questions regarding telemedicine have been answered.     If the phone/video visit was completed in an ambulatory setting, the patient and/or parent/guardian has also been advised to contact their provider???s office for worsening conditions, and seek emergency medical treatment and/or call 911 if the patient deems either necessary.        History of Presenting Illness:     Patient's post transplant course has been complicated by delayed  graft function, currently off hemodialysis.   UOP: 1400 - 2000 ml/day, in the month of June. She was advised to stop checking her U.O    Overall feels ok, except for slightly higher temperature of 99.5 on & off from last week. It lasts for 12 hours and then resolves. No other symptoms.    She denies chest pain, tightness. No cough/ loss of taste or smell.          Diabetes: No   HTN: Yes: Hypotension    Controlled:Yes   Adherence      With Medication: yes    With Follow up: yes    Functional Status: independent    Patient was found to have reflux nephropathy at age 84 and was on peritoneal dialysis briefly before receiving a living related renal transplant from mother. She had some thrombotic complication and underwent nephrectomy in 1997. She started dialysis and had a second kidney transplant at age 73 in 63. 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 She received a 3rd kidney transplant in 11/2002 which failed in 2007 due to chronic rejection.    She has decreased vision both eyes.    Review of Systems:     Fever or chills: negative   Sore throat: negative   Fatigue/malaise: negative   Weight loss or gain: negative   New skin rash/lump or bump: negative   Problems with teeth/gums: negative   Chest pain: negative   Cough or shortness of breath: negative   Swelling: negative.  Belly pain/heartburn/nausea/vomiting or diarrhea: negative   Pain or bleeding when urinating: negative   Twitching/numbness or weakness: negative     Physical Exam:     Not done    Renal Transplant History:    Race:  Caucasian   Age of recipient (at time of transplant): 38 y.o.   Cause of kidney disease: Reflux nephropathy   Native biopsy: No    Date of transplant: 12/05/18   Type of transplant: KDPI - 39%    - Donor creatinine: 1.3    - Any co morbidities: No    - Infection in donor: No    - HLA Mismatch: N/A    - Ischemia time: 6h 34m    - Crossmatch: negative    - Donor kidney biopsy: No diagnostic abnormalities identified (12/05/18)     Induction: [place] - alemtuzumab   Maintenance IS at the time of transplant: tacrolimus, mycophenolate   DGF: Yes   Diabetes Onset after transplant: No    Allergies:   Allergies   Allergen Reactions   ??? Ancef [Cefazolin] Shortness Of Breath   ??? Adhesive Tape-Silicones      Other reaction(s): Other (See Comments)  Uncoded Allergy. Allergen: plastic tape, Other Reaction: blistering   ??? Demerol [Meperidine] Nausea And Vomiting        Current Medications:   Current Outpatient Medications   Medication Sig Dispense Refill   ??? acetaminophen (TYLENOL) 500 MG tablet Take 2 tablets (1,000 mg total) by mouth every six (6) hours as needed for pain. 100 tablet 0   ??? aspirin (ECOTRIN) 81 MG tablet Take 1 tablet (81 mg total) by mouth daily. 30 tablet 11   ??? calcitrioL (ROCALTROL) 0.25 MCG capsule Take 1 capsule (0.25 mcg total) by mouth daily. 30 capsule 11   ??? carboxymethylcellulose sodium (REFRESH CELLUVISC) 1 % DpGe Instill drops in affected eye(s) as directed as needed.     ??? ferrous  sulfate 325 (65 FE) MG tablet Take 325 mg by mouth daily.     ??? MYFORTIC 180 mg EC tablet Take 4 tablets (720 mg total) by mouth Two (2) times a day. 180 tablet 11   ??? omeprazole (PRILOSEC) 40 MG capsule Take 1 capsule (40 mg total) by mouth daily. 30 capsule 11   ??? predniSONE (DELTASONE) 5 MG tablet Take 2 tablets (10 mg total) by mouth daily. 30 tablet 11   ??? sulfamethoxazole-trimethoprim (BACTRIM) 400-80 mg per tablet Take 1 tablet (80 mg of trimethoprim total) by mouth 3 (three) times a week. 12 tablet 5   ??? tacrolimus (PROGRAF) 1 MG capsule Take 2 capsules (2 mg total) by mouth every morning and 1 capsule (1 mg total) nightly. 90 capsule 11     Current Facility-Administered Medications   Medication Dose Route Frequency Provider Last Rate Last Dose   ??? darbepoetin alfa-polysorbate (ARANESP) injection 200 mcg  200 mcg Subcutaneous Once Leeroy Bock, MD           Past Medical History:   Past Medical History:   Diagnosis Date   ??? Bell's palsy    ??? Disease of thyroid gland    ??? ESRD (end stage renal disease) (CMS-HCC)    ??? Nonarteritic ischemic optic neuropathy    ??? Reflux nephropathy         Laboratory studies:     Recent Results (from the past 170 hour(s))   Magnesium Level    Collection Time: 04/11/19  8:42 AM   Result Value Ref Range    Magnesium 1.7 1.6 - 2.2 mg/dL   Comprehensive Metabolic Panel    Collection Time: 04/11/19  8:42 AM   Result Value Ref Range    Sodium 139 135 - 145 mmol/L    Potassium 4.1 3.5 - 5.0 mmol/L    Chloride 112 (H) 98 - 107 mmol/L    Anion Gap 7 7 - 15 mmol/L    CO2 20.0 (L) 22.0 - 30.0 mmol/L    BUN 27 (H) 7 - 21 mg/dL    Creatinine 1.61 (H) 0.60 - 1.00 mg/dL    BUN/Creatinine Ratio 13     EGFR CKD-EPI Non-African American, Female 29 (L) >=60 mL/min/1.53m2    EGFR CKD-EPI African American, Female 34 (L) >=60 mL/min/1.42m2    Glucose 99 70 - 179 mg/dL    Calcium 9.2 8.5 - 09.6 mg/dL    Albumin 3.8 3.5 - 5.0 g/dL    Total Protein 6.0 (L) 6.5 - 8.3 g/dL    Total Bilirubin 0.5 0.0 - 1.2 mg/dL    AST 18 14 - 38 U/L    ALT 27 <35 U/L    Alkaline Phosphatase 68 38 - 126 U/L   Phosphorus Level    Collection Time: 04/11/19  8:42 AM   Result Value Ref Range    Phosphorus 1.9 (L) 2.9 - 4.7 mg/dL   CBC w/ Differential    Collection Time: 04/11/19  8:42 AM   Result Value Ref Range    WBC 5.6 4.5 - 11.0 10*9/L    RBC 2.70 (L) 4.00 - 5.20 10*12/L    HGB 7.9 (L) 13.5 - 16.0 g/dL    HCT 04.5 (L) 40.9 - 46.0 %    MCV 88.8 80.0 - 100.0 fL    MCH 29.4 26.0 - 34.0 pg    MCHC 33.1 31.0 - 37.0 g/dL    RDW 81.1 91.4 - 78.2 %    MPV 6.8 (L) 7.0 -  10.0 fL    Platelet 259 150 - 440 10*9/L    Variable HGB Concentration Slight (A) Not Present    Neutrophils % 89.6 %    Lymphocytes % 1.9 %    Monocytes % 3.2 %    Eosinophils % 2.4 %    Basophils % 0.5 %    Absolute Neutrophils 5.0 2.0 - 7.5 10*9/L    Absolute Lymphocytes 0.1 (L) 1.5 - 5.0 10*9/L    Absolute Monocytes 0.2 0.2 - 0.8 10*9/L    Absolute Eosinophils 0.1 0.0 - 0.4 10*9/L    Absolute Basophils 0.0 0.0 - 0.1 10*9/L    Large Unstained Cells 2 0 - 4 %   Magnesium Level    Collection Time: 04/15/19  8:39 AM   Result Value Ref Range    Magnesium 1.8 1.6 - 2.2 mg/dL   Comprehensive Metabolic Panel    Collection Time: 04/15/19  8:39 AM   Result Value Ref Range    Sodium 138 135 - 145 mmol/L    Potassium 4.1 3.5 - 5.0 mmol/L    Chloride 113 (H) 98 - 107 mmol/L    Anion Gap 6 (L) 7 - 15 mmol/L    CO2 19.0 (L) 22.0 - 30.0 mmol/L    BUN 30 (H) 7 - 21 mg/dL    Creatinine 4.40 (H) 0.60 - 1.00 mg/dL    BUN/Creatinine Ratio 15     EGFR CKD-EPI Non-African American, Female 30 (L) >=60 mL/min/1.26m2    EGFR CKD-EPI African American, Female 34 (L) >=60 mL/min/1.32m2    Glucose 100 70 - 179 mg/dL    Calcium 9.3 8.5 - 10.2 mg/dL    Albumin 3.6 3.5 - 5.0 g/dL    Total Protein 5.8 (L) 6.5 - 8.3 g/dL    Total Bilirubin 0.5 0.0 - 1.2 mg/dL    AST 22 14 - 38 U/L    ALT 33 <35 U/L    Alkaline Phosphatase 76 38 - 126 U/L   Phosphorus Level    Collection Time: 04/15/19  8:39 AM   Result Value Ref Range    Phosphorus 2.1 (L) 2.9 - 4.7 mg/dL   CBC w/ Differential    Collection Time: 04/15/19  8:39 AM   Result Value Ref Range    WBC 5.0 4.5 - 11.0 10*9/L    RBC 2.54 (L) 4.00 - 5.20 10*12/L    HGB 7.7 (L) 13.5 - 16.0 g/dL    HCT 72.5 (L) 36.6 - 46.0 %    MCV 89.4 80.0 - 100.0 fL    MCH 30.1 26.0 - 34.0 pg    MCHC 33.7 31.0 - 37.0 g/dL    RDW 44.0 34.7 - 42.5 %    MPV 6.7 (L) 7.0 - 10.0 fL    Platelet 293 150 - 440 10*9/L    Variable HGB Concentration Slight (A) Not Present    Neutrophils % 90.1 %    Lymphocytes % 2.5 %    Monocytes % 2.8 %    Eosinophils % 1.8 %    Basophils % 0.8 %    Absolute Neutrophils 4.5 2.0 - 7.5 10*9/L    Absolute Lymphocytes 0.1 (L) 1.5 - 5.0 10*9/L    Absolute Monocytes 0.1 (L) 0.2 - 0.8 10*9/L    Absolute Eosinophils 0.1 0.0 - 0.4 10*9/L    Absolute Basophils 0.0 0.0 - 0.1 10*9/L    Large Unstained Cells 2 0 - 4 %       Leeroy Bock, MD

## 2019-04-17 MED FILL — OMEPRAZOLE 40 MG CAPSULE,DELAYED RELEASE: 30 days supply | Qty: 30 | Fill #4 | Status: AC

## 2019-04-17 MED FILL — CALCITRIOL 0.25 MCG CAPSULE: ORAL | 30 days supply | Qty: 30 | Fill #4

## 2019-04-17 MED FILL — MYFORTIC 180 MG TABLET,DELAYED RELEASE: ORAL | 23 days supply | Qty: 180 | Fill #1

## 2019-04-17 MED FILL — SULFAMETHOXAZOLE 400 MG-TRIMETHOPRIM 80 MG TABLET: ORAL | 28 days supply | Qty: 12 | Fill #4

## 2019-04-17 MED FILL — PREDNISONE 5 MG TABLET: 15 days supply | Qty: 30 | Fill #0 | Status: AC

## 2019-04-17 MED FILL — OMEPRAZOLE 40 MG CAPSULE,DELAYED RELEASE: ORAL | 30 days supply | Qty: 30 | Fill #4

## 2019-04-17 MED FILL — MYFORTIC 180 MG TABLET,DELAYED RELEASE: 23 days supply | Qty: 180 | Fill #1 | Status: AC

## 2019-04-17 MED FILL — CALCITRIOL 0.25 MCG CAPSULE: 30 days supply | Qty: 30 | Fill #4 | Status: AC

## 2019-04-17 MED FILL — SULFAMETHOXAZOLE 400 MG-TRIMETHOPRIM 80 MG TABLET: 28 days supply | Qty: 12 | Fill #4 | Status: AC

## 2019-04-18 ENCOUNTER — Ambulatory Visit: Admit: 2019-04-18 | Discharge: 2019-04-19 | Payer: MEDICARE

## 2019-04-18 DIAGNOSIS — Z940 Kidney transplant status: Principal | ICD-10-CM

## 2019-04-18 LAB — COMPREHENSIVE METABOLIC PANEL
ALBUMIN: 3.6 g/dL (ref 3.5–5.0)
ALKALINE PHOSPHATASE: 72 U/L (ref 38–126)
ALT (SGPT): 28 U/L (ref <35)
ANION GAP: 7 mmol/L (ref 7–15)
AST (SGOT): 20 U/L (ref 14–38)
BILIRUBIN TOTAL: 0.4 mg/dL (ref 0.0–1.2)
BLOOD UREA NITROGEN: 28 mg/dL — ABNORMAL HIGH (ref 7–21)
BUN / CREAT RATIO: 14
CALCIUM: 9.2 mg/dL (ref 8.5–10.2)
CHLORIDE: 112 mmol/L — ABNORMAL HIGH (ref 98–107)
CO2: 20 mmol/L — ABNORMAL LOW (ref 22.0–30.0)
CREATININE: 2.06 mg/dL — ABNORMAL HIGH (ref 0.60–1.00)
EGFR CKD-EPI AA FEMALE: 34 mL/min/{1.73_m2} — ABNORMAL LOW (ref >=60)
GLUCOSE RANDOM: 103 mg/dL (ref 70–179)
POTASSIUM: 3.9 mmol/L (ref 3.5–5.0)
PROTEIN TOTAL: 5.8 g/dL — ABNORMAL LOW (ref 6.5–8.3)
SODIUM: 139 mmol/L (ref 135–145)

## 2019-04-18 LAB — CBC W/ AUTO DIFF
BASOPHILS ABSOLUTE COUNT: 0 10*9/L (ref 0.0–0.1)
BASOPHILS RELATIVE PERCENT: 0.8 %
EOSINOPHILS ABSOLUTE COUNT: 0.1 10*9/L (ref 0.0–0.4)
EOSINOPHILS RELATIVE PERCENT: 2 %
HEMATOCRIT: 23.4 % — ABNORMAL LOW (ref 36.0–46.0)
HEMOGLOBIN: 7.5 g/dL — ABNORMAL LOW (ref 13.5–16.0)
LARGE UNSTAINED CELLS: 2 % (ref 0–4)
LYMPHOCYTES ABSOLUTE COUNT: 0.1 10*9/L — ABNORMAL LOW (ref 1.5–5.0)
LYMPHOCYTES RELATIVE PERCENT: 1.9 %
MEAN CORPUSCULAR HEMOGLOBIN: 29 pg (ref 26.0–34.0)
MEAN CORPUSCULAR VOLUME: 90 fL (ref 80.0–100.0)
MEAN PLATELET VOLUME: 7.3 fL (ref 7.0–10.0)
MONOCYTES ABSOLUTE COUNT: 0.1 10*9/L — ABNORMAL LOW (ref 0.2–0.8)
MONOCYTES RELATIVE PERCENT: 2.5 %
NEUTROPHILS RELATIVE PERCENT: 90.8 %
PLATELET COUNT: 337 10*9/L (ref 150–440)
RED BLOOD CELL COUNT: 2.6 10*12/L — ABNORMAL LOW (ref 4.00–5.20)
RED CELL DISTRIBUTION WIDTH: 14.9 % (ref 12.0–15.0)
WBC ADJUSTED: 4.9 10*9/L (ref 4.5–11.0)

## 2019-04-18 LAB — MAGNESIUM: Magnesium:MCnc:Pt:Ser/Plas:Qn:: 1.8

## 2019-04-18 LAB — ALKALINE PHOSPHATASE: Alkaline phosphatase:CCnc:Pt:Ser/Plas:Qn:: 72

## 2019-04-18 LAB — EOSINOPHILS ABSOLUTE COUNT: Lab: 0.1

## 2019-04-18 LAB — PHOSPHORUS: Phosphate:MCnc:Pt:Ser/Plas:Qn:: 2.4 — ABNORMAL LOW

## 2019-04-19 ENCOUNTER — Ambulatory Visit: Admit: 2019-04-19 | Discharge: 2019-04-19 | Payer: MEDICARE

## 2019-04-19 DIAGNOSIS — M7989 Other specified soft tissue disorders: Secondary | ICD-10-CM

## 2019-04-19 DIAGNOSIS — Z940 Kidney transplant status: Principal | ICD-10-CM

## 2019-04-19 NOTE — Unmapped (Signed)
Pt to ED for left arm swelling for the past several weeks

## 2019-04-19 NOTE — Unmapped (Signed)
1249: Patient arrived Transplant Infusion Room today for belatacept, Condition: well; Mobility: ambulating; accompanied by spouse.   1254: VS stable.  1258: PIV placed, NS KVO; labs no orders found, urine no orders found.  1355: Infusion initiated.  1427: Infusion complete.  1435: Increased swelling noted to left upper extremity. Nestor Lewandowsky, MD and Patton Salles contacted.   [id number] Lewandowsky, MD and Patton Salles returned page. MD would like patient to go to [facility] ED to evaluate left upper extremity for a blood clot and get ultrasound/ imaging of left arm. Patient is in agreement. VS stable, PIV removed, pt left clinic, Condition: well; Mobility: ambulating; accompanied by self.  See Flowsheet and MAR for all details of visit.

## 2019-04-19 NOTE — Unmapped (Addendum)
[facility]  Emergency Department Provider Note     ED Clinical Impression      Final diagnoses:   Left arm swelling (Primary)       Impression, ED Course, Assessment and Plan      Impression: 38 year old female with a history of multiple kidney transplants, most recent in February of this year who presents to the emergency department directed here from infusion clinic for evaluation of left arm swelling.  She reports are not swelling since her most recent transplant but the nurse evaluating her today felt this was worsened and contacted the patient's physician.  The patient referred the patient here to this emergency department for PVL of her left upper extremity.  Based on chart review, as well as the patient's history, I have lower suspicion for cellulitis, hypoalbuminemia, worsening renal function based on her labs.  She is otherwise well-appearing and in no acute distress.  She states that she would not be here if she was not directed by her physician.  Plan is for DVT ultrasound and disposition for results.    4:22 PM  Informal result from technician is that PVL is negative for DVT. Will discharge home with PCP follow up. She reports understanding. Strict return precautions discussed, patient is agreeable to plan.     4:37 PM  PVL negative.    Additional Medical Decision Making and Disclaimers     TRENESE HAFT was evaluated in Emergency Department at the time of this visit for the symptoms described in the history of present illness. She was evaluated in the context of the global COVID-19 pandemic, which necessitated consideration that the patient might be at risk for infection with the SARS-CoV-2 virus that causes COVID-19. Institutional protocols and algorithms that pertain to the evaluation of patients at risk for COVID-19 were followed during the patient's care in the ED.    I have reviewed the vital signs and the nursing notes. Labs and radiology results that were available during my care of the patient were independently reviewed by me and considered in my medical decision making. I discussed the case with the PVL technician. I reviewed the patient's prior medical records (clinic note today).     Portions of this record have been created using Scientist, clinical (histocompatibility and immunogenetics). Dictation errors have been sought, but may not have been identified and corrected.  ____________________________________________         History      Chief Complaint  Arm Swelling    HPI   Kimberly [name] is a 38 y.o. 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 Patient endorses 1 week of left upper arm swelling. She reports a history of intermittent swelling in the left arm due to fluid retention that has been improving since her most recent transplant. She was infusion clinic today and the nurse noted her arm appeared more swollen than prior infusions, so she contacted Dr. Nestor Lewandowsky who directed the patient to proceed to the [facility] emergency department for DVT ultrasound.  No recent trauma or injury to the arm. Patient denies any fevers, chills, chest pain, shortness of breath, nausea, vomiting, leg swelling, or any other medical concerns at this time.  She recently has had electrolytes showing no hypoalbuminemia, or worsening renal function.    Past Medical History:   Diagnosis Date   ??? Bell's palsy    ???  Disease of thyroid gland    ??? ESRD (end stage renal disease) (CMS-HCC)    ??? Nonarteritic ischemic optic neuropathy    ??? Reflux nephropathy        Patient Active Problem List   Diagnosis   ??? Chronic skin ulcer (CMS-HCC)   ??? Kidney replaced by transplant   ??? Anemia   ??? Ischemic optic neuropathy of both eyes   ??? 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 Closed nondisplaced fracture of lateral malleolus of right fibula   ??? Chronic hypotension   ??? Hyperparathyroidism due to renal insufficiency (CMS-HCC)   ??? ESRD (end stage renal disease) (CMS-HCC)   ??? Bell's palsy   ??? PCT (porphyria cutanea tarda) (CMS-HCC)       Past Surgical History:   Procedure Laterality Date   ??? AV FISTULA PLACEMENT Left     Left arm   ??? NEPHRECTOMY Bilateral    ??? PARATHYROIDECTOMY Bilateral 2010   ??? PR TRANSPLANT,PREP CADAVER RENAL GRAFT Left 12/05/2018    Procedure: [facility] STD PREP CAD DONR RENAL ALLOGFT PRIOR TO TRNSPLNT, INCL DISSEC/REM PERINEPH FAT, DIAPH/RTPER ATTAC;  Surgeon: Bufford Lope, MD;  Location: MAIN OR [place];  Service: Transplant   ??? PR TRANSPLANTATION OF KIDNEY Left 12/05/2018    Procedure: RENAL ALLOTRANSPLANTATION, IMPLANTATION OF GRAFT; WITHOUT RECIPIENT NEPHRECTOMY;  Surgeon: Bufford Lope, MD;  Location: MAIN OR [facility];  Service: Transplant   ??? SKIN BIOPSY     ??? SKIN BIOPSY Left     left hand         Current Facility-Administered Medications:   ???  darbepoetin alfa-polysorbate (ARANESP) injection 200 mcg, 200 mcg, Subcutaneous, Once, Leeroy Bock, MD    Current Outpatient Medications:   ???  acetaminophen (TYLENOL) 500 MG tablet, Take 2 tablets (1,000 mg total) by mouth every six (6) hours as needed for pain., Disp: 100 tablet, Rfl: 0  ???  aspirin (ECOTRIN) 81 MG tablet, Take 1 tablet (81 mg total) by mouth daily., Disp: 30 tablet, Rfl: 11  ???  calcitrioL (ROCALTROL) 0.25 MCG capsule, Take 1 capsule (0.25 mcg total) by mouth daily., Disp: 30 capsule, Rfl: 11  ???  carboxymethylcellulose sodium (REFRESH CELLUVISC) 1 % DpGe, Instill drops in affected eye(s) as directed as needed., Disp: , Rfl:   ???  ferrous sulfate 325 (65 FE) MG tablet, Take 325 mg by mouth daily., Disp: , Rfl:   ???  MYFORTIC 180 mg EC tablet, Take 4 tablets (720 mg total) by mouth Two (2) times a day., Disp: 180 tablet, Rfl: 11  ???  omeprazole (PRILOSEC) 40 MG capsule, Take 1 capsule (40 mg total) by mouth daily., Disp: 30 capsule, Rfl: 11  ???  predniSONE (DELTASONE) 5 MG tablet, Take 2 tablets (10 mg total) by mouth daily., Disp: 30 tablet, Rfl: 11  ??? sulfamethoxazole-trimethoprim (BACTRIM) 400-80 mg per tablet, Take 1 tablet (80 mg of trimethoprim total) by mouth 3 (three) times a week., Disp: 12 tablet, Rfl: 5  ???  tacrolimus (PROGRAF) 1 MG capsule, Take 2 capsules (2 mg total) by mouth every morning and 1 capsule (1 mg total) nightly., Disp: 90 capsule, Rfl: 11    Allergies  Ancef [cefazolin]; Adhesive tape-silicones; and Demerol [meperidine]    Family History   Problem Relation Age of Onset   ??? Diabetes Father    ??? Kidney disease Father    ??? Cancer Maternal Grandmother    ??? Cancer Maternal Grandfather    ??? Cancer Paternal Grandmother    ???  Cancer Paternal Grandfather    ??? Kidney disease Brother        Social History  Social History     Tobacco Use   ??? Smoking status: Never Smoker   ??? Smokeless tobacco: Never Used   Substance Use Topics   ??? Alcohol use: No   ??? Drug use: No       Review of Systems  A complete 10 point review of systems was performed and is negative other than as addressed in the HPI.      Physical Exam     This provider entered the patient's room: Yes:    ? If this provider did not enter the room, a comprehensive physical exam was not able to be performed due to increased infection risk to themselves, other providers, staff and other patients), as well as to conserve personal protective equipment (PPE) utilization during the COVID-19 pandemic.    ? If this provider did enter the patient room, the following was PPE worn: Surgical mask, eye protection and gloves      ED Triage Vitals [04/19/19 1545]   Enc Vitals Group      BP 141/74      Heart Rate 94      SpO2 Pulse       Resp 20      Temp 36.3 ??C (97.3 ??F)      Temp Source Oral      SpO2 99 %     Constitutional: Alert and oriented. Well appearing and in no distress.  Eyes: Conjunctivae are normal.  ENT       Head: Normocephalic and atraumatic.       Nose: No rhinorrhea.       Mouth/Throat: Mucous membranes appear moist.       Neck: No audible stridor.  Cardiovascular: Blood pressure and pulse rate as documented above.  Palpable thrill in previous fistula.  Respiratory: Normal respiratory pattern and effort. No audible wheezing or other abnormal breath sounds. Speaking easily in full sentences. No cough noted during my evaluation of the patient.  Gastrointestinal: Deferred.  Musculoskeletal: Entire left arm is swollen when compared to right. Palpable thrill in previous fistula. Moving all extremities normally.  Neurologic: Normal speech and language. No gross focal neurologic deficits are appreciated.  Skin: Skin appears warm and dry. No rash visible.  Psychiatric: Mood and affect are normal. Speech and behavior are normal.       Radiology     PVL Venous Duplex Upper Extremity Left            Documentation assistance was provided by Lurena Nida, Scribes, on April 19, 2019 at 3:52 PM for Lasandra Beech, MD.    A scribe was used when documenting this visit. I agree with the above documentation. Signed by  Katherina Mires on  April 19, 2019 at 4:26 PM.      Blima Singer, MD  04/19/19 1628       Blima Singer, MD  04/19/19 [phone]

## 2019-04-19 NOTE — Unmapped (Signed)
Pt ambulated to RM 5. Introduced to the call-bell and was directed to call if any needs were to arise.

## 2019-04-22 ENCOUNTER — Ambulatory Visit: Admit: 2019-04-22 | Discharge: 2019-04-23 | Payer: MEDICARE

## 2019-04-22 DIAGNOSIS — Z940 Kidney transplant status: Principal | ICD-10-CM

## 2019-04-22 LAB — CBC W/ AUTO DIFF
BASOPHILS ABSOLUTE COUNT: 0 10*9/L (ref 0.0–0.1)
BASOPHILS RELATIVE PERCENT: 1 %
EOSINOPHILS ABSOLUTE COUNT: 0.1 10*9/L (ref 0.0–0.4)
EOSINOPHILS RELATIVE PERCENT: 1.8 %
HEMATOCRIT: 24.1 % — ABNORMAL LOW (ref 36.0–46.0)
HEMOGLOBIN: 7.7 g/dL — ABNORMAL LOW (ref 13.5–16.0)
LARGE UNSTAINED CELLS: 4 % (ref 0–4)
LYMPHOCYTES ABSOLUTE COUNT: 0.1 10*9/L — ABNORMAL LOW (ref 1.5–5.0)
LYMPHOCYTES RELATIVE PERCENT: 3.1 %
MEAN CORPUSCULAR HEMOGLOBIN: 28.9 pg (ref 26.0–34.0)
MEAN CORPUSCULAR VOLUME: 90.2 fL (ref 80.0–100.0)
MEAN PLATELET VOLUME: 7.2 fL (ref 7.0–10.0)
MONOCYTES ABSOLUTE COUNT: 0.1 10*9/L — ABNORMAL LOW (ref 0.2–0.8)
MONOCYTES RELATIVE PERCENT: 4.3 %
NEUTROPHILS ABSOLUTE COUNT: 2.7 10*9/L (ref 2.0–7.5)
NEUTROPHILS RELATIVE PERCENT: 86.2 %
PLATELET COUNT: 353 10*9/L (ref 150–440)
RED CELL DISTRIBUTION WIDTH: 15.1 % — ABNORMAL HIGH (ref 12.0–15.0)
WBC ADJUSTED: 3.1 10*9/L — ABNORMAL LOW (ref 4.5–11.0)

## 2019-04-22 LAB — COMPREHENSIVE METABOLIC PANEL
ALBUMIN: 3.5 g/dL (ref 3.5–5.0)
ALKALINE PHOSPHATASE: 72 U/L (ref 38–126)
ALT (SGPT): 31 U/L (ref <35)
ANION GAP: 7 mmol/L (ref 7–15)
BILIRUBIN TOTAL: 0.5 mg/dL (ref 0.0–1.2)
BLOOD UREA NITROGEN: 24 mg/dL — ABNORMAL HIGH (ref 7–21)
BUN / CREAT RATIO: 12
CALCIUM: 9.2 mg/dL (ref 8.5–10.2)
CHLORIDE: 112 mmol/L — ABNORMAL HIGH (ref 98–107)
CREATININE: 2.04 mg/dL — ABNORMAL HIGH (ref 0.60–1.00)
EGFR CKD-EPI AA FEMALE: 35 mL/min/{1.73_m2} — ABNORMAL LOW (ref >=60)
EGFR CKD-EPI NON-AA FEMALE: 30 mL/min/{1.73_m2} — ABNORMAL LOW (ref >=60)
GLUCOSE RANDOM: 102 mg/dL (ref 70–179)
POTASSIUM: 4 mmol/L (ref 3.5–5.0)
PROTEIN TOTAL: 5.6 g/dL — ABNORMAL LOW (ref 6.5–8.3)
SODIUM: 139 mmol/L (ref 135–145)

## 2019-04-22 LAB — ALBUMIN: Albumin:MCnc:Pt:Ser/Plas:Qn:: 3.5

## 2019-04-22 LAB — MAGNESIUM: Magnesium:MCnc:Pt:Ser/Plas:Qn:: 1.8

## 2019-04-22 LAB — PHOSPHORUS: Phosphate:MCnc:Pt:Ser/Plas:Qn:: 2.5 — ABNORMAL LOW

## 2019-04-22 LAB — EOSINOPHILS RELATIVE PERCENT: Lab: 1.8

## 2019-04-22 MED ORDER — PREDNISONE 10 MG TABLET
ORAL_TABLET | Freq: Every day | ORAL | 11 refills | 30.00000 days | Status: CP
Start: 2019-04-22 — End: 2019-06-04
  Filled 2019-04-23: qty 30, 30d supply, fill #0

## 2019-04-22 NOTE — Unmapped (Signed)
[facility] Refill Coordination Note    Specialty Medication(s) to be Shipped:   Transplant: Prednisone 10mg     Other medication(s) to be shipped: none     Kimberly [name], DOB: 1981-09-05  Phone: [phone] (home)       All above HIPAA information was verified with patient.     Completed refill call assessment today to schedule patient's medication shipment from the [facility] [phone]).       Specialty medication(s) and dose(s) confirmed: Regimen is correct and unchanged.   Changes to medications: Kimberly [name] reports no changes at this time.  Changes to insurance: No  Questions for the pharmacist: No    Confirmed patient received Welcome Packet with first shipment. The patient will receive a drug information handout for each medication shipped and additional FDA Medication Guides as required.       DISEASE/MEDICATION-SPECIFIC INFORMATION        N/A    SPECIALTY MEDICATION ADHERENCE     Medication Adherence    Patient reported X missed doses in the last month:  0  Specialty Medication:  Prednisone  Patient is on additional specialty medications:  No                prednisone 10 mg: 7 days of medicine on hand          SHIPPING     Shipping address confirmed in Epic.     Delivery Scheduled: Yes, Expected medication delivery date: 04/24/19.     Medication will be delivered via UPS to the home address in Epic WAM.    Unk Lightning   [facility] Specialty Technician

## 2019-04-22 NOTE — Unmapped (Signed)
Infusion RN called to report patient arm is swollen,Consult with Dr Nestor Lewandowsky, MD patient advised to go to ER for further evaluation.

## 2019-04-23 MED FILL — PREDNISONE 10 MG TABLET: 30 days supply | Qty: 30 | Fill #0 | Status: AC

## 2019-04-25 ENCOUNTER — Ambulatory Visit: Admit: 2019-04-25 | Discharge: 2019-04-25 | Payer: MEDICARE

## 2019-04-25 ENCOUNTER — Institutional Professional Consult (permissible substitution): Admit: 2019-04-25 | Discharge: 2019-04-25 | Payer: MEDICARE

## 2019-04-25 DIAGNOSIS — Z940 Kidney transplant status: Principal | ICD-10-CM

## 2019-04-25 DIAGNOSIS — Z4822 Encounter for aftercare following kidney transplant: Principal | ICD-10-CM

## 2019-04-25 LAB — CBC W/ AUTO DIFF
BASOPHILS ABSOLUTE COUNT: 0 10*9/L (ref 0.0–0.1)
BASOPHILS RELATIVE PERCENT: 0.8 %
EOSINOPHILS ABSOLUTE COUNT: 0.1 10*9/L (ref 0.0–0.4)
EOSINOPHILS RELATIVE PERCENT: 2.2 %
HEMATOCRIT: 23.8 % — ABNORMAL LOW (ref 36.0–46.0)
HEMOGLOBIN: 7.6 g/dL — ABNORMAL LOW (ref 13.5–16.0)
LARGE UNSTAINED CELLS: 4 % (ref 0–4)
LYMPHOCYTES ABSOLUTE COUNT: 0.2 10*9/L — ABNORMAL LOW (ref 1.5–5.0)
LYMPHOCYTES RELATIVE PERCENT: 7.3 %
MEAN CORPUSCULAR HEMOGLOBIN CONC: 31.9 g/dL (ref 31.0–37.0)
MEAN CORPUSCULAR HEMOGLOBIN: 28.8 pg (ref 26.0–34.0)
MEAN CORPUSCULAR VOLUME: 90.2 fL (ref 80.0–100.0)
MEAN PLATELET VOLUME: 7.4 fL (ref 7.0–10.0)
MONOCYTES ABSOLUTE COUNT: 0.2 10*9/L (ref 0.2–0.8)
MONOCYTES RELATIVE PERCENT: 5.7 %
NEUTROPHILS RELATIVE PERCENT: 80.1 %
PLATELET COUNT: 325 10*9/L (ref 150–440)
RED BLOOD CELL COUNT: 2.64 10*12/L — ABNORMAL LOW (ref 4.00–5.20)
RED CELL DISTRIBUTION WIDTH: 15.1 % — ABNORMAL HIGH (ref 12.0–15.0)
WBC ADJUSTED: 2.7 10*9/L — ABNORMAL LOW (ref 4.5–11.0)

## 2019-04-25 LAB — COMPREHENSIVE METABOLIC PANEL
ALBUMIN: 3.4 g/dL — ABNORMAL LOW (ref 3.5–5.0)
ALKALINE PHOSPHATASE: 82 U/L (ref 38–126)
ALT (SGPT): 30 U/L (ref <35)
ANION GAP: 6 mmol/L — ABNORMAL LOW (ref 7–15)
AST (SGOT): 24 U/L (ref 14–38)
BILIRUBIN TOTAL: 0.3 mg/dL (ref 0.0–1.2)
BUN / CREAT RATIO: 12
CHLORIDE: 113 mmol/L — ABNORMAL HIGH (ref 98–107)
CO2: 20 mmol/L — ABNORMAL LOW (ref 22.0–30.0)
CREATININE: 2.02 mg/dL — ABNORMAL HIGH (ref 0.60–1.00)
EGFR CKD-EPI AA FEMALE: 35 mL/min/{1.73_m2} — ABNORMAL LOW (ref >=60)
EGFR CKD-EPI NON-AA FEMALE: 31 mL/min/{1.73_m2} — ABNORMAL LOW (ref >=60)
GLUCOSE RANDOM: 108 mg/dL (ref 70–179)
POTASSIUM: 4.3 mmol/L (ref 3.5–5.0)
PROTEIN TOTAL: 5.5 g/dL — ABNORMAL LOW (ref 6.5–8.3)
SODIUM: 139 mmol/L (ref 135–145)

## 2019-04-25 LAB — MAGNESIUM: Magnesium:MCnc:Pt:Ser/Plas:Qn:: 1.9

## 2019-04-25 LAB — ALBUMIN: Albumin:MCnc:Pt:Ser/Plas:Qn:: 3.4 — ABNORMAL LOW

## 2019-04-25 LAB — PHOSPHORUS: Phosphate:MCnc:Pt:Ser/Plas:Qn:: 2.6 — ABNORMAL LOW

## 2019-04-25 LAB — LARGE UNSTAINED CELLS: Lab: 4

## 2019-04-25 NOTE — Unmapped (Signed)
Pt here for Anemia clinic. Per Patton Salles and MD order, pt recieved dose of Aranesp 200 mcg SQ in right arm, pt tolerated well. Pt advised of potential s/sx and when to go to ED. Pt left in NAD.

## 2019-04-29 ENCOUNTER — Ambulatory Visit: Admit: 2019-04-29 | Discharge: 2019-04-30 | Payer: MEDICARE

## 2019-04-29 DIAGNOSIS — Z940 Kidney transplant status: Principal | ICD-10-CM

## 2019-04-29 LAB — COMPREHENSIVE METABOLIC PANEL
ALBUMIN: 3.5 g/dL (ref 3.5–5.0)
ALKALINE PHOSPHATASE: 80 U/L (ref 38–126)
ALT (SGPT): 30 U/L (ref <35)
ANION GAP: 8 mmol/L (ref 7–15)
AST (SGOT): 21 U/L (ref 14–38)
BILIRUBIN TOTAL: 0.5 mg/dL (ref 0.0–1.2)
BLOOD UREA NITROGEN: 27 mg/dL — ABNORMAL HIGH (ref 7–21)
BUN / CREAT RATIO: 13
CHLORIDE: 111 mmol/L — ABNORMAL HIGH (ref 98–107)
CO2: 19 mmol/L — ABNORMAL LOW (ref 22.0–30.0)
EGFR CKD-EPI AA FEMALE: 34 mL/min/{1.73_m2} — ABNORMAL LOW (ref >=60)
EGFR CKD-EPI NON-AA FEMALE: 29 mL/min/{1.73_m2} — ABNORMAL LOW (ref >=60)
GLUCOSE RANDOM: 99 mg/dL (ref 70–179)
POTASSIUM: 4.1 mmol/L (ref 3.5–5.0)
PROTEIN TOTAL: 5.7 g/dL — ABNORMAL LOW (ref 6.5–8.3)
SODIUM: 138 mmol/L (ref 135–145)

## 2019-04-29 LAB — CBC W/ AUTO DIFF
BASOPHILS ABSOLUTE COUNT: 0 10*9/L (ref 0.0–0.1)
BASOPHILS RELATIVE PERCENT: 1.3 %
EOSINOPHILS ABSOLUTE COUNT: 0 10*9/L (ref 0.0–0.4)
HEMATOCRIT: 25.4 % — ABNORMAL LOW (ref 36.0–46.0)
HEMOGLOBIN: 8.1 g/dL — ABNORMAL LOW (ref 13.5–16.0)
LARGE UNSTAINED CELLS: 3 % (ref 0–4)
LYMPHOCYTES ABSOLUTE COUNT: 0.2 10*9/L — ABNORMAL LOW (ref 1.5–5.0)
LYMPHOCYTES RELATIVE PERCENT: 8.5 %
MEAN CORPUSCULAR HEMOGLOBIN CONC: 32 g/dL (ref 31.0–37.0)
MEAN CORPUSCULAR VOLUME: 91.9 fL (ref 80.0–100.0)
MEAN PLATELET VOLUME: 6.9 fL — ABNORMAL LOW (ref 7.0–10.0)
MONOCYTES ABSOLUTE COUNT: 0.1 10*9/L — ABNORMAL LOW (ref 0.2–0.8)
MONOCYTES RELATIVE PERCENT: 4.8 %
NEUTROPHILS ABSOLUTE COUNT: 1.7 10*9/L — ABNORMAL LOW (ref 2.0–7.5)
NEUTROPHILS RELATIVE PERCENT: 80.8 %
PLATELET COUNT: 339 10*9/L (ref 150–440)
RED BLOOD CELL COUNT: 2.77 10*12/L — ABNORMAL LOW (ref 4.00–5.20)
RED CELL DISTRIBUTION WIDTH: 15.3 % — ABNORMAL HIGH (ref 12.0–15.0)
WBC ADJUSTED: 2.1 10*9/L — ABNORMAL LOW (ref 4.5–11.0)

## 2019-04-29 LAB — MAGNESIUM: Magnesium:MCnc:Pt:Ser/Plas:Qn:: 1.9

## 2019-04-29 LAB — CHLORIDE: Chloride:SCnc:Pt:Ser/Plas:Qn:: 111 — ABNORMAL HIGH

## 2019-04-29 LAB — HEMATOCRIT: Lab: 25.4 — ABNORMAL LOW

## 2019-04-29 LAB — PHOSPHORUS: Phosphate:MCnc:Pt:Ser/Plas:Qn:: 2.5 — ABNORMAL LOW

## 2019-05-02 ENCOUNTER — Ambulatory Visit: Admit: 2019-05-02 | Discharge: 2019-05-03 | Payer: MEDICARE

## 2019-05-02 DIAGNOSIS — Z940 Kidney transplant status: Principal | ICD-10-CM

## 2019-05-02 LAB — CBC W/ AUTO DIFF
BASOPHILS ABSOLUTE COUNT: 0 10*9/L (ref 0.0–0.1)
BASOPHILS RELATIVE PERCENT: 0.6 %
EOSINOPHILS ABSOLUTE COUNT: 0.1 10*9/L (ref 0.0–0.4)
EOSINOPHILS RELATIVE PERCENT: 1.9 %
HEMATOCRIT: 25.2 % — ABNORMAL LOW (ref 36.0–46.0)
HEMOGLOBIN: 8.1 g/dL — ABNORMAL LOW (ref 13.5–16.0)
LARGE UNSTAINED CELLS: 4 % (ref 0–4)
LYMPHOCYTES ABSOLUTE COUNT: 0.3 10*9/L — ABNORMAL LOW (ref 1.5–5.0)
LYMPHOCYTES RELATIVE PERCENT: 10.6 %
MEAN CORPUSCULAR HEMOGLOBIN CONC: 32 g/dL (ref 31.0–37.0)
MEAN CORPUSCULAR HEMOGLOBIN: 29.3 pg (ref 26.0–34.0)
MEAN PLATELET VOLUME: 6.7 fL — ABNORMAL LOW (ref 7.0–10.0)
MONOCYTES ABSOLUTE COUNT: 0.2 10*9/L (ref 0.2–0.8)
NEUTROPHILS ABSOLUTE COUNT: 1.9 10*9/L — ABNORMAL LOW (ref 2.0–7.5)
NEUTROPHILS RELATIVE PERCENT: 77.1 %
PLATELET COUNT: 330 10*9/L (ref 150–440)
RED BLOOD CELL COUNT: 2.75 10*12/L — ABNORMAL LOW (ref 4.00–5.20)
RED CELL DISTRIBUTION WIDTH: 15.8 % — ABNORMAL HIGH (ref 12.0–15.0)
WBC ADJUSTED: 2.4 10*9/L — ABNORMAL LOW (ref 4.5–11.0)

## 2019-05-02 LAB — PHOSPHORUS: Phosphate:MCnc:Pt:Ser/Plas:Qn:: 2.6 — ABNORMAL LOW

## 2019-05-02 LAB — COMPREHENSIVE METABOLIC PANEL
ALBUMIN: 3.4 g/dL — ABNORMAL LOW (ref 3.5–5.0)
ALT (SGPT): 23 U/L (ref <35)
ANION GAP: 8 mmol/L (ref 7–15)
AST (SGOT): 22 U/L (ref 14–38)
BILIRUBIN TOTAL: 0.5 mg/dL (ref 0.0–1.2)
BLOOD UREA NITROGEN: 24 mg/dL — ABNORMAL HIGH (ref 7–21)
BUN / CREAT RATIO: 13
CALCIUM: 9.2 mg/dL (ref 8.5–10.2)
CHLORIDE: 110 mmol/L — ABNORMAL HIGH (ref 98–107)
CO2: 19 mmol/L — ABNORMAL LOW (ref 22.0–30.0)
CREATININE: 1.91 mg/dL — ABNORMAL HIGH (ref 0.60–1.00)
EGFR CKD-EPI NON-AA FEMALE: 33 mL/min/{1.73_m2} — ABNORMAL LOW (ref >=60)
GLUCOSE RANDOM: 97 mg/dL (ref 70–179)
POTASSIUM: 4.3 mmol/L (ref 3.5–5.0)
PROTEIN TOTAL: 5.7 g/dL — ABNORMAL LOW (ref 6.5–8.3)
SODIUM: 137 mmol/L (ref 135–145)

## 2019-05-02 LAB — BLOOD UREA NITROGEN: Urea nitrogen:MCnc:Pt:Ser/Plas:Qn:: 24 — ABNORMAL HIGH

## 2019-05-02 LAB — PLATELET COUNT: Platelets:NCnc:Pt:Bld:Qn:Automated count: 330

## 2019-05-02 LAB — MAGNESIUM: Magnesium:MCnc:Pt:Ser/Plas:Qn:: 1.9

## 2019-05-03 ENCOUNTER — Ambulatory Visit: Admit: 2019-05-03 | Discharge: 2019-05-03 | Payer: MEDICARE

## 2019-05-03 DIAGNOSIS — Z940 Kidney transplant status: Principal | ICD-10-CM

## 2019-05-03 NOTE — Unmapped (Signed)
1247: Patient arrived Transplant Infusion Room today for Belatacept, Condition: well; Mobility: ambulating; accompanied by self.    1250: VS stable,  1310: PIV placed with brisk blood return.  1330: Infusion initiated.  1401: Infusion complete.  1414: VS stable, PIV removed, pt left clinic, Condition: well; Mobility: ambulating; accompanied by self.  See Flowsheet and MAR for all details of visit.

## 2019-05-06 ENCOUNTER — Institutional Professional Consult (permissible substitution): Admit: 2019-05-06 | Discharge: 2019-05-07 | Payer: MEDICARE

## 2019-05-06 ENCOUNTER — Ambulatory Visit: Admit: 2019-05-06 | Discharge: 2019-05-07 | Payer: MEDICARE

## 2019-05-06 DIAGNOSIS — Z940 Kidney transplant status: Principal | ICD-10-CM

## 2019-05-06 DIAGNOSIS — Z4822 Encounter for aftercare following kidney transplant: Principal | ICD-10-CM

## 2019-05-06 LAB — CBC W/ AUTO DIFF
BASOPHILS ABSOLUTE COUNT: 0 10*9/L (ref 0.0–0.1)
BASOPHILS RELATIVE PERCENT: 0.7 %
EOSINOPHILS ABSOLUTE COUNT: 0.1 10*9/L (ref 0.0–0.4)
EOSINOPHILS RELATIVE PERCENT: 3.1 %
HEMATOCRIT: 26.5 % — ABNORMAL LOW (ref 36.0–46.0)
HEMOGLOBIN: 8.3 g/dL — ABNORMAL LOW (ref 13.5–16.0)
LARGE UNSTAINED CELLS: 2 % (ref 0–4)
LYMPHOCYTES ABSOLUTE COUNT: 0.3 10*9/L — ABNORMAL LOW (ref 1.5–5.0)
LYMPHOCYTES RELATIVE PERCENT: 12.4 %
MEAN CORPUSCULAR HEMOGLOBIN CONC: 31.3 g/dL (ref 31.0–37.0)
MEAN CORPUSCULAR HEMOGLOBIN: 28.7 pg (ref 26.0–34.0)
MEAN PLATELET VOLUME: 7.5 fL (ref 7.0–10.0)
MONOCYTES RELATIVE PERCENT: 7 %
NEUTROPHILS ABSOLUTE COUNT: 1.6 10*9/L — ABNORMAL LOW (ref 2.0–7.5)
NEUTROPHILS RELATIVE PERCENT: 74.4 %
PLATELET COUNT: 333 10*9/L (ref 150–440)
RED BLOOD CELL COUNT: 2.9 10*12/L — ABNORMAL LOW (ref 4.00–5.20)
RED CELL DISTRIBUTION WIDTH: 15.5 % — ABNORMAL HIGH (ref 12.0–15.0)

## 2019-05-06 LAB — PHOSPHORUS: Phosphate:MCnc:Pt:Ser/Plas:Qn:: 2.5 — ABNORMAL LOW

## 2019-05-06 LAB — COMPREHENSIVE METABOLIC PANEL
ALBUMIN: 3.6 g/dL (ref 3.5–5.0)
ALKALINE PHOSPHATASE: 72 U/L (ref 38–126)
ALT (SGPT): 28 U/L (ref <35)
ANION GAP: 8 mmol/L (ref 7–15)
AST (SGOT): 20 U/L (ref 14–38)
BILIRUBIN TOTAL: 0.5 mg/dL (ref 0.0–1.2)
BLOOD UREA NITROGEN: 21 mg/dL (ref 7–21)
BUN / CREAT RATIO: 11
CALCIUM: 9.2 mg/dL (ref 8.5–10.2)
CO2: 21 mmol/L — ABNORMAL LOW (ref 22.0–30.0)
CREATININE: 1.83 mg/dL — ABNORMAL HIGH (ref 0.60–1.00)
EGFR CKD-EPI AA FEMALE: 40 mL/min/{1.73_m2} — ABNORMAL LOW (ref >=60)
EGFR CKD-EPI NON-AA FEMALE: 35 mL/min/{1.73_m2} — ABNORMAL LOW (ref >=60)
GLUCOSE RANDOM: 94 mg/dL (ref 70–179)
PROTEIN TOTAL: 5.9 g/dL — ABNORMAL LOW (ref 6.5–8.3)
SODIUM: 139 mmol/L (ref 135–145)

## 2019-05-06 LAB — RED BLOOD CELL COUNT: Lab: 2.9 — ABNORMAL LOW

## 2019-05-06 LAB — CHLORIDE: Chloride:SCnc:Pt:Ser/Plas:Qn:: 110 — ABNORMAL HIGH

## 2019-05-06 LAB — MAGNESIUM
MAGNESIUM: 1.9 mg/dL (ref 1.6–2.2)
Magnesium:MCnc:Pt:Ser/Plas:Qn:: 1.9

## 2019-05-06 NOTE — Unmapped (Signed)
Aranesp given patient tolerated well.

## 2019-05-09 NOTE — Unmapped (Signed)
[facility] Refill Coordination Note    Specialty Medication(s) to be Shipped:   Transplant: Myfortic 180mg  and Prednisone 10mg     Other medication(s) to be shipped:   Bactrim  Omeprazole   Calcitriol    **PT reports she is now taking IV anit-rejection medication, and Tacrolimus has been stopped.      Kimberly [name], DOB: [birth year]  Phone: [phone] (home)       All above HIPAA information was verified with patient.     Completed refill call assessment today to schedule patient's medication shipment from the [facility] [phone]).       Specialty medication(s) and dose(s) confirmed: Regimen is correct and unchanged.   Changes to medications: Kimberly [name] reports no changes at this time.  Changes to insurance: No  Questions for the pharmacist: No    Confirmed patient received Welcome Packet with first shipment. The patient will receive a drug information handout for each medication shipped and additional FDA Medication Guides as required.       DISEASE/MEDICATION-SPECIFIC INFORMATION        N/A    SPECIALTY MEDICATION ADHERENCE           Myfortic 180mg : 10 days on hand  Prednisone 10mg : 10 days on hand          SHIPPING     Shipping address confirmed in Epic.     Delivery Scheduled: Yes, Expected medication delivery date: 05/15/19.     Medication will be delivered via UPS to the home address in Epic WAM.    Kimberly [name]   [facility] Specialty Technician

## 2019-05-10 MED ORDER — SULFAMETHOXAZOLE 400 MG-TRIMETHOPRIM 80 MG TABLET
ORAL_TABLET | ORAL | 5 refills | 28 days | Status: CP
Start: 2019-05-10 — End: 2019-05-17
  Filled 2019-05-14: qty 12, 28d supply, fill #0

## 2019-05-13 ENCOUNTER — Ambulatory Visit: Admit: 2019-05-13 | Discharge: 2019-05-14 | Payer: MEDICARE

## 2019-05-13 DIAGNOSIS — Z940 Kidney transplant status: Principal | ICD-10-CM

## 2019-05-13 LAB — CBC W/ AUTO DIFF
BASOPHILS ABSOLUTE COUNT: 0 10*9/L (ref 0.0–0.1)
BASOPHILS RELATIVE PERCENT: 0.5 %
EOSINOPHILS ABSOLUTE COUNT: 0 10*9/L (ref 0.0–0.4)
HEMATOCRIT: 27.8 % — ABNORMAL LOW (ref 36.0–46.0)
HEMOGLOBIN: 8.9 g/dL — ABNORMAL LOW (ref 13.5–16.0)
LARGE UNSTAINED CELLS: 3 % (ref 0–4)
LYMPHOCYTES ABSOLUTE COUNT: 0.4 10*9/L — ABNORMAL LOW (ref 1.5–5.0)
LYMPHOCYTES RELATIVE PERCENT: 15.5 %
MEAN CORPUSCULAR HEMOGLOBIN CONC: 31.9 g/dL (ref 31.0–37.0)
MEAN CORPUSCULAR HEMOGLOBIN: 29.3 pg (ref 26.0–34.0)
MEAN CORPUSCULAR VOLUME: 91.8 fL (ref 80.0–100.0)
MEAN PLATELET VOLUME: 7 fL (ref 7.0–10.0)
MONOCYTES RELATIVE PERCENT: 9 %
NEUTROPHILS ABSOLUTE COUNT: 2 10*9/L (ref 2.0–7.5)
NEUTROPHILS RELATIVE PERCENT: 70.2 %
PLATELET COUNT: 332 10*9/L (ref 150–440)
RED BLOOD CELL COUNT: 3.03 10*12/L — ABNORMAL LOW (ref 4.00–5.20)
RED CELL DISTRIBUTION WIDTH: 16.4 % — ABNORMAL HIGH (ref 12.0–15.0)
WBC ADJUSTED: 2.8 10*9/L — ABNORMAL LOW (ref 4.5–11.0)

## 2019-05-13 LAB — MAGNESIUM
MAGNESIUM: 1.9 mg/dL (ref 1.6–2.2)
Magnesium:MCnc:Pt:Ser/Plas:Qn:: 1.9

## 2019-05-13 LAB — PHOSPHORUS
PHOSPHORUS: 2.7 mg/dL — ABNORMAL LOW (ref 2.9–4.7)
Phosphate:MCnc:Pt:Ser/Plas:Qn:: 2.7 — ABNORMAL LOW

## 2019-05-13 LAB — BASOPHILS ABSOLUTE COUNT: Lab: 0

## 2019-05-14 MED FILL — OMEPRAZOLE 40 MG CAPSULE,DELAYED RELEASE: 30 days supply | Qty: 30 | Fill #5 | Status: AC

## 2019-05-14 MED FILL — MYFORTIC 180 MG TABLET,DELAYED RELEASE: ORAL | 23 days supply | Qty: 180 | Fill #2

## 2019-05-14 MED FILL — PREDNISONE 10 MG TABLET: 30 days supply | Qty: 30 | Fill #1 | Status: AC

## 2019-05-14 MED FILL — CALCITRIOL 0.25 MCG CAPSULE: ORAL | 30 days supply | Qty: 30 | Fill #5

## 2019-05-14 MED FILL — MYFORTIC 180 MG TABLET,DELAYED RELEASE: 23 days supply | Qty: 180 | Fill #2 | Status: AC

## 2019-05-14 MED FILL — PREDNISONE 10 MG TABLET: ORAL | 30 days supply | Qty: 30 | Fill #1

## 2019-05-14 MED FILL — SULFAMETHOXAZOLE 400 MG-TRIMETHOPRIM 80 MG TABLET: 28 days supply | Qty: 12 | Fill #0 | Status: AC

## 2019-05-14 MED FILL — CALCITRIOL 0.25 MCG CAPSULE: 30 days supply | Qty: 30 | Fill #5 | Status: AC

## 2019-05-14 MED FILL — OMEPRAZOLE 40 MG CAPSULE,DELAYED RELEASE: ORAL | 30 days supply | Qty: 30 | Fill #5

## 2019-05-17 ENCOUNTER — Ambulatory Visit: Admit: 2019-05-17 | Discharge: 2019-05-18 | Payer: MEDICARE | Attending: Nephrology | Primary: Nephrology

## 2019-05-17 DIAGNOSIS — Z0000 Encounter for general adult medical examination without abnormal findings: Secondary | ICD-10-CM

## 2019-05-17 DIAGNOSIS — Z940 Kidney transplant status: Principal | ICD-10-CM

## 2019-05-17 LAB — URINALYSIS
BILIRUBIN UA: NEGATIVE
GLUCOSE UA: NEGATIVE
KETONES UA: NEGATIVE
NITRITE UA: POSITIVE — AB
PH UA: 6 (ref 5.0–9.0)
PROTEIN UA: 30 — AB
RBC UA: 62 /HPF — ABNORMAL HIGH (ref <=4)
SPECIFIC GRAVITY UA: 1.02 (ref 1.003–1.030)
SQUAMOUS EPITHELIAL: 3 /HPF (ref 0–5)
UROBILINOGEN UA: 0.2

## 2019-05-17 LAB — CREATININE, URINE: Lab: 145.9

## 2019-05-17 LAB — KETONES UA: Lab: NEGATIVE

## 2019-05-17 LAB — PROTEIN / CREATININE RATIO, URINE: PROTEIN/CREAT RATIO, URINE: 0.407

## 2019-05-17 NOTE — Unmapped (Signed)
Addended by: Sharlotte Alamo E on: 05/17/2019 11:55 AM     Modules accepted: Orders

## 2019-05-17 NOTE — Unmapped (Signed)
[facility]   ACUTE/CHRONIC TRANSPLANT FOLLOW UP     PCP: Estevan Oaks, MD     Date of Visit at Transplant clinic: 05/17/2019     Graft Status: improving    Assessment/Recommendations:    1) s/p 4th Kidney txp - 12/05/18 - native disease reflux nephropathy  1st kidney transplant - 1995 -1997 - LR failed due to thrombotic issues.  2nd kidney transplant - 2000-2002 - DD failed chronic rejection.  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 Her last dialysis was on 03/07/19.  Has a good U.O.    - She has been having swelling in her left arm (acess arm). Will have her follow up with Dr. Norma Fredrickson (Transplant Surgery) for evaluation and possible fistula reduction.    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    Creatinine - value 1.83, improving  Urine analysis: protein 100 mg/dl / WBC >161 / RBC <1 (0/9604)  Urine protein/creatinine ratio: 0.746 (12/21/18)  DSA: negative - 12/24/18    Last CMV checked: not detected - 12/17/18  Last BKV checked: not checked    Prophylaxis -  Bactrim 80 mg TIW (until 06/05/19)    2) Immunosuppression: Myfortic 720 mg BID / Prednisone 10 mg daily / Belatacept monthly (last on 05/03/19)  - Changes in Immunosuppression - Will plan to decrease prednisone to 7.5 mg daily in a month with eventual goal of 5 mg daily  - On belatacept due to biopsy findings.  - Medications side effects: No    3) Hypotension - Controlled - off midodrine now.  Goal for B.P > 100 systolic.  Changes in B.P medications - none.    4) Anemia - stable - last Hgb 8.9   Goal for Hemoglobin > 11.0  Ferritin 34.1 (02/25/19)  - On iron supplement TID > If constipation persists, can decrease to BID. Also suggested she look up iron-rich foods to incorporate into her diet  - Will give Aranesp today    5) MBD - Calcium 9.2 / Phosphorus 2.5 - Secondary hyperparathyroidism  On calcitriol 0.25 mcg daily  iPTH - 73.8 (12/05/18)    6) Electrolytes: Bicarb low (21.0)  - Will decrease bicarb from 1300mg  to 650mg  BID    7) Immunizations:  Influenza (inactivated only): 08/09/18  Pneumococcal vaccination (inactivated only):     8) Cancer screening:  PAP smear - 3/15 negative   Mammogram - Age 89.    Follow up: 2 months.      History of Presenting Illness:     Since patient's last visit in the transplant clinic - patient has been doing well in terms of transplant, taking transplant medications regularly, no episodes of rejection and no side effects of medications.    Since her last visit, patient presented to the ED on 04/19/19 for left arm swelling from infusion clinic. PVL was negative for DVT. She received belatacept infusions 375 mg on 6/26 and 05/03/19.    Today, she continues to complain of her left arm swelling. She also reports constipation. Denies chest pain or tightness, fever, chills, nausea, vomiting, headache, lightheadedness, dizziness, difficulty voiding, or reduced appetite.     Diabetes: No   HTN: Yes: Hypotension  Controlled:Yes   Adherence      With Medication: yes    With Follow up: yes    Functional Status: independent    Patient was found to have reflux nephropathy at age 88 and was on peritoneal dialysis briefly before receiving a living related renal transplant from mother. She had some thrombotic complication and underwent nephrectomy in 1997. She started dialysis and had a second kidney transplant at age 50 in 59. 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 She received a 3rd kidney transplant in 11/2002 which failed in 2007 due to chronic rejection.    She has decreased vision both eyes.    Review of Systems:     Fever or chills: negative   Sore throat: negative   Fatigue/malaise: negative   Weight loss or gain: negative   New skin rash/lump or bump: negative   Problems with teeth/gums: negative   Chest pain: negative   Cough or shortness of breath: negative   Swelling: negative.  Belly pain/heartburn/nausea/vomiting or diarrhea: negative   Pain or bleeding when urinating: negative   Twitching/numbness or weakness: negative     Physical Exam:   BP 124/62 (BP Site: R Arm, BP Position: Sitting, BP Cuff Size: Medium)  - Pulse 80  - Temp 36.1 ??C (97 ??F) (Temporal)  - Resp 14  - Wt 77.7 kg (171 lb 6.4 oz)  - BMI 28.98 kg/m??      Nursing note and vitals reviewed.   Constitutional: Oriented to person, place, and time. Appears well-developed and well-nourished. No distress.   HENT: wearing mask  Head: Normocephalic and atraumatic.   Eyes: Right eye exhibits no discharge. Left eye exhibits no discharge. No scleral icterus.   Neck: wearing mask    Cardiovascular: Normal rate and regular rhythm. Exam reveals no gallop and no friction rub. No murmur heard.   Pulmonary/Chest: Effort normal and breath sounds normal. No respiratory distress.   Abdominal: Soft. Non tender  Musculoskeletal: Normal range of motion. Trace edema B/L LE and 2+ edema left U.E, no tenderness.   Neurological: Alert and oriented to person, place, and time.   Skin: Skin is warm and dry. No rash noted. Not diaphoretic. No erythema. No pallor.   Psychiatric: Normal mood and affect. Behavior is normal. Judgment and thought content normal.     Renal Transplant History:    Race:  Caucasian   Age of recipient (at time of transplant): 38 y.o.   Cause of kidney disease: Reflux nephropathy   Native biopsy: No    Date of transplant: 12/05/18   Type of transplant: KDPI - 39%    - Donor creatinine: 1.3    - Any co morbidities: No    - Infection in donor: No    - HLA Mismatch: N/A    - Ischemia time: 6h 22m    - Crossmatch: negative    - Donor kidney biopsy: No diagnostic abnormalities identified (12/05/18)     Induction: Campath - alemtuzumab Maintenance IS at the time of transplant: tacrolimus, mycophenolate   DGF: Yes   Diabetes Onset after transplant: No    Allergies:   Allergies   Allergen Reactions   ??? Ancef [Cefazolin] Shortness Of Breath   ??? Adhesive Tape-Silicones      Other reaction(s): Other (See Comments)  Uncoded Allergy. Allergen: plastic tape, Other Reaction: blistering   ??? Demerol [Meperidine] Nausea And Vomiting        Current Medications:  Current Outpatient Medications   Medication Sig Dispense Refill   ??? acetaminophen (TYLENOL) 500 MG tablet Take 2 tablets (1,000 mg total) by mouth every six (6) hours as needed for pain. 100 tablet 0   ??? aspirin (ECOTRIN) 81 MG tablet Take 1 tablet (81 mg total) by mouth daily. 30 tablet 11   ??? calcitrioL (ROCALTROL) 0.25 MCG capsule Take 1 capsule (0.25 mcg total) by mouth daily. 30 capsule 11   ??? carboxymethylcellulose sodium (REFRESH CELLUVISC) 1 % DpGe Instill drops in affected eye(s) as directed as needed.     ??? ferrous sulfate 325 (65 FE) MG tablet Take 325 mg by mouth daily.     ??? MYFORTIC 180 mg EC tablet Take 4 tablets (720 mg total) by mouth Two (2) times a day. 180 tablet 11   ??? omeprazole (PRILOSEC) 40 MG capsule Take 1 capsule (40 mg total) by mouth daily. 30 capsule 11   ??? predniSONE (DELTASONE) 10 MG tablet Take 1 tablet (10 mg total) by mouth daily. 30 tablet 11     Current Facility-Administered Medications   Medication Dose Route Frequency Provider Last Rate Last Dose   ??? darbepoetin alfa-polysorbate (ARANESP) injection 200 mcg  200 mcg Subcutaneous Once Leeroy Bock, MD           Past Medical History:   Past Medical History:   Diagnosis Date   ??? Bell's palsy    ??? Disease of thyroid gland    ??? ESRD (end stage renal disease) (CMS-HCC)    ??? Nonarteritic ischemic optic neuropathy    ??? Reflux nephropathy         Laboratory studies:     Recent Results (from the past 170 hour(s))   Magnesium Level    Collection Time: 05/13/19  8:37 AM   Result Value Ref Range    Magnesium 1.9 1.6 - 2.2 mg/dL Phosphorus Level    Collection Time: 05/13/19  8:37 AM   Result Value Ref Range    Phosphorus 2.7 (L) 2.9 - 4.7 mg/dL   CBC w/ Differential    Collection Time: 05/13/19  8:37 AM   Result Value Ref Range    WBC 2.8 (L) 4.5 - 11.0 10*9/L    RBC 3.03 (L) 4.00 - 5.20 10*12/L    HGB 8.9 (L) 13.5 - 16.0 g/dL    HCT 78.2 (L) 95.6 - 46.0 %    MCV 91.8 80.0 - 100.0 fL    MCH 29.3 26.0 - 34.0 pg    MCHC 31.9 31.0 - 37.0 g/dL    RDW 21.3 (H) 08.6 - 15.0 %    MPV 7.0 7.0 - 10.0 fL    Platelet 332 150 - 440 10*9/L    Variable HGB Concentration Slight (A) Not Present    Neutrophils % 70.2 %    Lymphocytes % 15.5 %    Monocytes % 9.0 %    Eosinophils % 1.3 %    Basophils % 0.5 %    Absolute Neutrophils 2.0 2.0 - 7.5 10*9/L    Absolute Lymphocytes 0.4 (L) 1.5 - 5.0 10*9/L    Absolute Monocytes 0.3 0.2 - 0.8 10*9/L    Absolute Eosinophils 0.0 0.0 - 0.4 10*9/L    Absolute Basophils 0.0 0.0 - 0.1 10*9/L    Large Unstained Cells 3 0 - 4 %    Anisocytosis Slight (A) Not Present    Hypochromasia Moderate (A) Not Present       Scribe's Attestation: Leeroy Bock, MD obtained and  performed the history, physical exam and medical decision making elements that were entered into the chart.  Signed by Gaye Alken, Scribe, on May 17, 2019 at 10:09 AM.    Documentation assistance provided by the Scribe. I was present during the time the encounter was recorded. The information recorded by the Scribe was done at my direction and has been reviewed and validated by me.

## 2019-05-17 NOTE — Unmapped (Signed)
Patient has labs drawn on 05/13/2019 and hgb was 8.9.Per Dr. Nestor Lewandowsky, patient is to receive 200 mcg of Aranesp. Patient received 200 mcg of aranesp in right arm, patient tolerated well. Provider will notify patient when to come back for next injection.

## 2019-05-18 NOTE — Unmapped (Signed)
Patient called to discuss results of urinalysis and whether she needed to be started on antibiotics.  Informed patient that urine culture has not resulted and that we did not want to treat her preemptively. Patient verbalized understanding.

## 2019-05-20 ENCOUNTER — Ambulatory Visit: Admit: 2019-05-20 | Discharge: 2019-05-21 | Payer: MEDICARE

## 2019-05-20 DIAGNOSIS — Z940 Kidney transplant status: Principal | ICD-10-CM

## 2019-05-20 LAB — BASIC METABOLIC PANEL
ANION GAP: 6 mmol/L — ABNORMAL LOW (ref 7–15)
BLOOD UREA NITROGEN: 20 mg/dL (ref 7–21)
BUN / CREAT RATIO: 11
CALCIUM: 9.6 mg/dL (ref 8.5–10.2)
CHLORIDE: 108 mmol/L — ABNORMAL HIGH (ref 98–107)
CO2: 22 mmol/L (ref 22.0–30.0)
CREATININE: 1.81 mg/dL — ABNORMAL HIGH (ref 0.60–1.00)
EGFR CKD-EPI AA FEMALE: 40 mL/min/{1.73_m2} — ABNORMAL LOW (ref >=60)
GLUCOSE RANDOM: 91 mg/dL (ref 70–179)
POTASSIUM: 4.1 mmol/L (ref 3.5–5.0)
SODIUM: 136 mmol/L (ref 135–145)

## 2019-05-20 LAB — CBC W/ AUTO DIFF
BASOPHILS ABSOLUTE COUNT: 0 10*9/L (ref 0.0–0.1)
BASOPHILS RELATIVE PERCENT: 0.5 %
EOSINOPHILS ABSOLUTE COUNT: 0.1 10*9/L (ref 0.0–0.4)
HEMATOCRIT: 29.4 % — ABNORMAL LOW (ref 36.0–46.0)
HEMOGLOBIN: 9.1 g/dL — ABNORMAL LOW (ref 13.5–16.0)
LARGE UNSTAINED CELLS: 3 % (ref 0–4)
LYMPHOCYTES ABSOLUTE COUNT: 0.5 10*9/L — ABNORMAL LOW (ref 1.5–5.0)
LYMPHOCYTES RELATIVE PERCENT: 13.8 %
MEAN CORPUSCULAR HEMOGLOBIN: 28.4 pg (ref 26.0–34.0)
MEAN CORPUSCULAR VOLUME: 91.7 fL (ref 80.0–100.0)
MEAN PLATELET VOLUME: 7.3 fL (ref 7.0–10.0)
MONOCYTES ABSOLUTE COUNT: 0.3 10*9/L (ref 0.2–0.8)
MONOCYTES RELATIVE PERCENT: 7.2 %
NEUTROPHILS ABSOLUTE COUNT: 2.9 10*9/L (ref 2.0–7.5)
NEUTROPHILS RELATIVE PERCENT: 74.3 %
PLATELET COUNT: 317 10*9/L (ref 150–440)
RED BLOOD CELL COUNT: 3.21 10*12/L — ABNORMAL LOW (ref 4.00–5.20)
RED CELL DISTRIBUTION WIDTH: 16 % — ABNORMAL HIGH (ref 12.0–15.0)
WBC ADJUSTED: 3.9 10*9/L — ABNORMAL LOW (ref 4.5–11.0)

## 2019-05-20 LAB — PHOSPHORUS: Phosphate:MCnc:Pt:Ser/Plas:Qn:: 2.5 — ABNORMAL LOW

## 2019-05-20 LAB — BASOPHILS RELATIVE PERCENT: Lab: 0.5

## 2019-05-20 LAB — MAGNESIUM: Magnesium:MCnc:Pt:Ser/Plas:Qn:: 2

## 2019-05-20 LAB — ANION GAP: Anion gap 3:SCnc:Pt:Ser/Plas:Qn:: 6 — ABNORMAL LOW

## 2019-05-20 MED ORDER — CIPROFLOXACIN 500 MG TABLET
ORAL_TABLET | Freq: Two times a day (BID) | ORAL | 0 refills | 7 days | Status: CP
Start: 2019-05-20 — End: 2019-06-07

## 2019-05-20 MED ORDER — SODIUM BICARBONATE 650 MG TABLET
ORAL_TABLET | Freq: Two times a day (BID) | ORAL | 3 refills | 90 days | Status: CP
Start: 2019-05-20 — End: 2020-05-19

## 2019-05-20 NOTE — Unmapped (Signed)
Pt advised to start Cipro 500 mg BID for UC + Klebsiella pneumoniae.

## 2019-05-27 ENCOUNTER — Ambulatory Visit: Admit: 2019-05-27 | Discharge: 2019-05-28 | Payer: MEDICARE

## 2019-05-27 DIAGNOSIS — Z940 Kidney transplant status: Principal | ICD-10-CM

## 2019-05-27 LAB — CBC W/ AUTO DIFF
BASOPHILS ABSOLUTE COUNT: 0 10*9/L (ref 0.0–0.1)
BASOPHILS RELATIVE PERCENT: 0.1 %
EOSINOPHILS ABSOLUTE COUNT: 0.1 10*9/L (ref 0.0–0.4)
EOSINOPHILS RELATIVE PERCENT: 1.5 %
HEMOGLOBIN: 9 g/dL — ABNORMAL LOW (ref 13.5–16.0)
LYMPHOCYTES ABSOLUTE COUNT: 0.5 10*9/L — ABNORMAL LOW (ref 1.5–5.0)
LYMPHOCYTES RELATIVE PERCENT: 13.1 %
MEAN CORPUSCULAR HEMOGLOBIN CONC: 30.4 g/dL — ABNORMAL LOW (ref 31.0–37.0)
MEAN CORPUSCULAR HEMOGLOBIN: 27.3 pg (ref 26.0–34.0)
MEAN CORPUSCULAR VOLUME: 89.8 fL (ref 80.0–100.0)
MEAN PLATELET VOLUME: 7 fL (ref 7.0–10.0)
MONOCYTES ABSOLUTE COUNT: 0.3 10*9/L (ref 0.2–0.8)
MONOCYTES RELATIVE PERCENT: 7.3 %
NEUTROPHILS ABSOLUTE COUNT: 2.8 10*9/L (ref 2.0–7.5)
NEUTROPHILS RELATIVE PERCENT: 75.2 %
PLATELET COUNT: 366 10*9/L (ref 150–440)
RED BLOOD CELL COUNT: 3.3 10*12/L — ABNORMAL LOW (ref 4.00–5.20)
RED CELL DISTRIBUTION WIDTH: 15.1 % — ABNORMAL HIGH (ref 12.0–15.0)
WBC ADJUSTED: 3.8 10*9/L — ABNORMAL LOW (ref 4.5–11.0)

## 2019-05-27 LAB — BASIC METABOLIC PANEL
ANION GAP: 9 mmol/L (ref 7–15)
BLOOD UREA NITROGEN: 25 mg/dL — ABNORMAL HIGH (ref 7–21)
CALCIUM: 9.4 mg/dL (ref 8.5–10.2)
CHLORIDE: 109 mmol/L — ABNORMAL HIGH (ref 98–107)
CO2: 21 mmol/L — ABNORMAL LOW (ref 22.0–30.0)
CREATININE: 1.83 mg/dL — ABNORMAL HIGH (ref 0.60–1.00)
EGFR CKD-EPI AA FEMALE: 40 mL/min/{1.73_m2} — ABNORMAL LOW (ref >=60)
EGFR CKD-EPI NON-AA FEMALE: 35 mL/min/{1.73_m2} — ABNORMAL LOW (ref >=60)
GLUCOSE RANDOM: 89 mg/dL (ref 70–179)
POTASSIUM: 4.5 mmol/L (ref 3.5–5.0)

## 2019-05-27 LAB — PHOSPHORUS: Phosphate:MCnc:Pt:Ser/Plas:Qn:: 2.6 — ABNORMAL LOW

## 2019-05-27 LAB — MAGNESIUM: Magnesium:MCnc:Pt:Ser/Plas:Qn:: 2

## 2019-05-27 LAB — MONOCYTES RELATIVE PERCENT: Lab: 7.3

## 2019-05-27 LAB — EGFR CKD-EPI NON-AA FEMALE: Lab: 35 — ABNORMAL LOW

## 2019-05-29 NOTE — Unmapped (Signed)
Dialysis Access Coordinator Note:  DAMISHA WOLFF, DOB: 01/21/1981    Issue/Concern:  Scheduled for consult possible revision??    Current Dialysis Access:   L arm fistula?? Per Dr Nestor Lewandowsky note 05/17/2019    Previous dialysis access history:   unknown    Vessel Mapping/Studies:   none    NOTES:   05/29/2019  Pt is scheduled to see Dr Norma Fredrickson for consult possible fistula revision on 06/06/2019?? Only 175d post txp. Initial pt referral was a direct schedule and was not processed by me.    PMH: kidney txp 12/05/2018  Labs on 05/27/2019: sCr 1.83; GFR 35    Nephrologist: Oda Cogan, MD    Dialysis Center:  n/a  Dialysis Days:   n/a    Pt lives in: [place], [name]    [place], [place]  Dialysis/Vascular Access Coordinator   [place] [facility]  [address], [place], [name] [address]  Desk: [phone]  Fax: [phone]

## 2019-05-31 ENCOUNTER — Ambulatory Visit: Admit: 2019-05-31 | Discharge: 2019-06-01 | Payer: MEDICARE

## 2019-05-31 DIAGNOSIS — Z940 Kidney transplant status: Principal | ICD-10-CM

## 2019-05-31 NOTE — Unmapped (Signed)
1330 Patient arrived Transplant Infusion Room today for belatacept, Condition: well; Mobility: ambulating; accompanied by self.   See Flowsheet and MAR for all details of visit.   1338 VS stable.  1338  PIV placed, NS KVO; labs no orders found, urine no orders found.  1355 Infusion initiated.  1425 Infusion complete.  1428 VS stable, PIV removed, pt left clinic, Condition: well; Mobility: ambulating; accompanied by self.

## 2019-06-03 ENCOUNTER — Ambulatory Visit: Admit: 2019-06-03 | Discharge: 2019-06-04 | Payer: MEDICARE

## 2019-06-03 DIAGNOSIS — Z940 Kidney transplant status: Principal | ICD-10-CM

## 2019-06-03 LAB — CBC W/ AUTO DIFF
BASOPHILS ABSOLUTE COUNT: 0 10*9/L (ref 0.0–0.1)
EOSINOPHILS ABSOLUTE COUNT: 0.1 10*9/L (ref 0.0–0.4)
EOSINOPHILS RELATIVE PERCENT: 2.2 %
HEMATOCRIT: 31.4 % — ABNORMAL LOW (ref 36.0–46.0)
HEMOGLOBIN: 9.5 g/dL — ABNORMAL LOW (ref 13.5–16.0)
LARGE UNSTAINED CELLS: 2 % (ref 0–4)
LYMPHOCYTES RELATIVE PERCENT: 14 %
MEAN CORPUSCULAR HEMOGLOBIN CONC: 30.2 g/dL — ABNORMAL LOW (ref 31.0–37.0)
MEAN CORPUSCULAR HEMOGLOBIN: 26.6 pg (ref 26.0–34.0)
MEAN CORPUSCULAR VOLUME: 88.1 fL (ref 80.0–100.0)
MEAN PLATELET VOLUME: 6.7 fL — ABNORMAL LOW (ref 7.0–10.0)
MONOCYTES ABSOLUTE COUNT: 0.4 10*9/L (ref 0.2–0.8)
MONOCYTES RELATIVE PERCENT: 7.6 %
NEUTROPHILS ABSOLUTE COUNT: 3.4 10*9/L (ref 2.0–7.5)
NEUTROPHILS RELATIVE PERCENT: 73.8 %
PLATELET COUNT: 402 10*9/L (ref 150–440)
RED BLOOD CELL COUNT: 3.56 10*12/L — ABNORMAL LOW (ref 4.00–5.20)
RED CELL DISTRIBUTION WIDTH: 15.1 % — ABNORMAL HIGH (ref 12.0–15.0)
WBC ADJUSTED: 4.7 10*9/L (ref 4.5–11.0)

## 2019-06-03 LAB — MEAN CORPUSCULAR VOLUME: Lab: 88.1

## 2019-06-03 LAB — BASIC METABOLIC PANEL
ANION GAP: 8 mmol/L (ref 7–15)
BLOOD UREA NITROGEN: 29 mg/dL — ABNORMAL HIGH (ref 7–21)
BUN / CREAT RATIO: 17
CALCIUM: 9.9 mg/dL (ref 8.5–10.2)
CO2: 23 mmol/L (ref 22.0–30.0)
CREATININE: 1.73 mg/dL — ABNORMAL HIGH (ref 0.60–1.00)
EGFR CKD-EPI AA FEMALE: 43 mL/min/{1.73_m2} — ABNORMAL LOW (ref >=60)
EGFR CKD-EPI NON-AA FEMALE: 37 mL/min/{1.73_m2} — ABNORMAL LOW (ref >=60)
GLUCOSE RANDOM: 87 mg/dL (ref 70–179)
SODIUM: 138 mmol/L (ref 135–145)

## 2019-06-03 LAB — PHOSPHORUS: Phosphate:MCnc:Pt:Ser/Plas:Qn:: 3.2

## 2019-06-03 LAB — SODIUM: Sodium:SCnc:Pt:Ser/Plas:Qn:: 138

## 2019-06-03 LAB — MAGNESIUM: Magnesium:MCnc:Pt:Ser/Plas:Qn:: 1.9

## 2019-06-04 MED ORDER — MYFORTIC 180 MG TABLET,DELAYED RELEASE
ORAL_TABLET | Freq: Two times a day (BID) | ORAL | 11 refills | 30 days | Status: CP
Start: 2019-06-04 — End: 2020-06-03
  Filled 2019-06-06: qty 240, 30d supply, fill #0

## 2019-06-04 MED ORDER — PREDNISONE 2.5 MG TABLET
ORAL_TABLET | Freq: Every day | ORAL | 11 refills | 30 days | Status: CP
Start: 2019-06-04 — End: 2020-06-03
  Filled 2019-07-02: qty 90, 30d supply, fill #0

## 2019-06-04 NOTE — Unmapped (Signed)
[facility] Refill Coordination Note    Specialty Medication(s) to be Shipped:   Transplant: Myfortic 180mg     Other medication(s) to be shipped: n/a     Kimberly [name], DOB: 1981-02-02  Phone: [phone] (home)       All above HIPAA information was verified with patient.     Completed refill call assessment today to schedule patient's medication shipment from the [facility] [phone]).       Specialty medication(s) and dose(s) confirmed: Regimen is correct and unchanged.   Changes to medications: Kimberly [name] reports no changes reported at this time.  Changes to insurance: No  Questions for the pharmacist: No    Confirmed patient received Welcome Packet with first shipment. The patient will receive a drug information handout for each medication shipped and additional FDA Medication Guides as required.       DISEASE/MEDICATION-SPECIFIC INFORMATION        N/A    SPECIALTY MEDICATION ADHERENCE                Myfortic 180 mg: 5 days of medicine on hand         SHIPPING     Shipping address confirmed in Epic.     Delivery Scheduled: Yes, Expected medication delivery date: 06/07/19.     Medication will be delivered via UPS to the home address in Epic [place].    Kim[place]ly [name]   [facility] Specialty Pharmacist

## 2019-06-05 NOTE — Unmapped (Signed)
VASCULAR ACCESS CLINIC    Assessment/Plan  Kimberly [name] presented to clinic for vascular access consultation at the referral of Dr. Leeroy Bock for aneurysmal fistula and arm swelling.    Discussed possibilities for intervention with patient including:  - no surgical intervention. managing swelling with alternative methods like wrapping/lymphedema massage assessing for any worsening symptoms etc  - complete ligation of fistula, which would require her to seek new access placement PRN in the future  - assess for any central stenosis issues which would require contrast dye  - banding of fistula to reduce inflow    Ordered same day duplex to assess fistula size and flow and any possibility for future banding as patient hopes to mainly save the fistula and find more comfort.     Patient is aware that although banding could be potentially attempted, this may physiologically over time actually ligate the fistula if it is not being used regularly, as it is not a truly precise measure. Will review findings and discuss with patient, and continue to monitor status of kidney function.    I spent 30 minutes with the patient obtaining the above history, medical record review and physical examination, and greater than 50% of the time was spent on counseling and the substance of the discussion.    Subjective  Kimberly [name] is a 38 y.o. female who presents for vascular access consultation. 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 She has a longstanding hemodialysis history, between each of her transplants, and most recently using her fistula post transplant, and since holding HD and not accessing (increasing dry weight per patient report) she has had significant swelling to left arm. She underwent duplex of her upper arm to r/o DVT. She is concerned that her kidney is only now beginning to function well and would like to discuss Saving the fistula for future needs rather than ligation.  Patient is an excellent historian, so a significant portion of time was spent reviewing access surgery/intervention with patient and reviewing her records from [facility] which correlate.       Allergies  Ancef [cefazolin], Adhesive tape-silicones, and Demerol [meperidine]    Medications    Current Outpatient Medications   Medication Sig Dispense Refill   ??? acetaminophen (TYLENOL) 500 MG tablet Take 2 tablets (1,000 mg total) by mouth every six (6) hours as needed for pain. 100 tablet 0   ??? aspirin (ECOTRIN) 81 MG tablet Take 1 tablet (81 mg total) by mouth daily. 30 tablet 11   ??? calcitrioL (ROCALTROL) 0.25 MCG capsule Take 1 capsule (0.25 mcg total) by mouth daily. 30 capsule 11   ??? carboxymethylcellulose sodium (REFRESH CELLUVISC) 1 % DpGe Instill drops in affected eye(s) as directed as needed.     ??? ferrous sulfate 325 (65 FE) MG tablet Take 325 mg by mouth daily.     ??? MYFORTIC 180 mg EC tablet Take 4 tablets (720 mg total) by mouth Two (2) times a day. 240 tablet 11   ??? omeprazole (PRILOSEC) 40 MG capsule Take 1 capsule (40 mg total) by mouth daily. 30 capsule 11   ??? predniSONE (DELTASONE) 2.5 MG tablet Take 3 tablets (7.5 mg total) by mouth daily. 90 tablet 11   ??? sodium bicarbonate 650 mg tablet Take 1 tablet (650 mg total) by  mouth Two (2) times a day. 180 tablet 3   ??? ciprofloxacin HCl (CIPRO) 500 MG tablet Take 1 tablet (500 mg total) by mouth Two (2) times a day for 10 days. 14 tablet 1     No current facility-administered medications for this visit.        Past Medical History  Past Medical History:   Diagnosis Date   ??? Bell's palsy    ??? Disease of thyroid gland    ??? ESRD (end stage renal disease) (CMS-HCC)    ??? Nonarteritic ischemic optic neuropathy    ??? Reflux nephropathy        Past Surgical History  Past Surgical History: Procedure Laterality Date   ??? AV FISTULA PLACEMENT Left     Left arm   ??? NEPHRECTOMY Bilateral    ??? PARATHYROIDECTOMY Bilateral 2010   ??? PR TRANSPLANT,PREP CADAVER RENAL GRAFT Left 12/05/2018    Procedure: [facility] STD PREP CAD DONR RENAL ALLOGFT PRIOR TO TRNSPLNT, INCL DISSEC/REM PERINEPH FAT, DIAPH/RTPER ATTAC;  Surgeon: Bufford Lope, MD;  Location: MAIN OR [facility];  Service: Transplant   ??? PR TRANSPLANTATION OF KIDNEY Left 12/05/2018    Procedure: RENAL ALLOTRANSPLANTATION, IMPLANTATION OF GRAFT; WITHOUT RECIPIENT NEPHRECTOMY;  Surgeon: Bufford Lope, MD;  Location: MAIN OR [facility];  Service: Transplant   ??? SKIN BIOPSY     ??? SKIN BIOPSY Left     left hand       Review of Systems  A 12 system review of systems was negative except as noted in HPI    Objective  Blood pressure 130/71, pulse 99, temperature 36.2 ??C (97.2 ??F), temperature source Temporal, height 162.6 cm (5' 4), weight 75.8 kg (167 lb), not currently breastfeeding. Body mass index is 28.67 kg/m??.    Lungs: clear to auscultation, percussion to the bases, and unlabored breathing   Heart: euvolemic, regular rate and rhythm, normal S1 and S2, no murmur  Ext: 3+ edema to LUE extending from shoulder to hand. saccular fistula with many visible collaterals to upper arm. multiple scars across arm. 3 palpable stents to upper arm and axilla. pulsatility to [facility] fossa aspect of fistula, does not empty with elevation  Neuro: grossly intact    Test Results  Lab Results   Component Value Date    WBC 4.7 06/03/2019    HGB 9.5 (L) 06/03/2019    HCT 31.4 (L) 06/03/2019    PLT 402 06/03/2019     Lab Results   Component Value Date    NA 138 06/03/2019    K 4.3 06/03/2019    CL 107 06/03/2019    CO2 23.0 06/03/2019    BUN 29 (H) 06/03/2019    CREATININE 1.73 (H) 06/03/2019    CALCIUM 9.9 06/03/2019    MG 1.9 06/03/2019    PHOS 3.2 06/03/2019       Imaging/Vein Mapping:  Duplex 06/06/2019  Left brachiocephalic AVF was scanned throughout. Aneurysmal dilitation of the outflow vein at 2cm and 4cm of 24.8 mm & 32.3 mm . No focal narrowing noted within the AVF. Left brachiocephalic AVF is patent. Multiple branches seen comming off the outflow at the mid-distal upper arm that leads to group of varicosities at the mid upper arm.    +----------------------------+--------+------+--------+-----------+  -Brachio-cephalic/basilic AVF-Diameter-Depth -PSV     -Flow Volume-  +----------------------------+--------+------+--------+-----------+  -Distal Brachial Artery      -9.8 mm  -9.8 mm-344 cm/s-3574 ml/min-  +----------------------------+--------+------+--------+-----------+  -AVF anastomosis             -  7.0 mm  -5.7 mm-278 cm/s-           -  +----------------------------+--------+------+--------+-----------+  -2 cm proximal to anastomosis-24.8 mm -1.5 mm-30 cm/s -           -  -(outflow vein)              -        -      -        -           -  +----------------------------+--------+------+--------+-----------+  -4 cm proximal to anastomosis-32.3 mm -3.0 mm-109 cm/s-           -  -(outflow vein)              -        -      -        -           -  +----------------------------+--------+------+--------+-----------+  -8 cm proximal to anastomosis-13.5 mm -1.7 mm-34 cm/s -1177 ml/min-  -(outflow vein)              -        -      -        -           -      _______________________________________

## 2019-06-06 ENCOUNTER — Ambulatory Visit: Admit: 2019-06-06 | Discharge: 2019-06-06 | Payer: MEDICARE

## 2019-06-06 ENCOUNTER — Ambulatory Visit: Admit: 2019-06-06 | Discharge: 2019-06-06 | Payer: MEDICARE | Attending: Surgery | Primary: Surgery

## 2019-06-06 DIAGNOSIS — N186 End stage renal disease: Principal | ICD-10-CM

## 2019-06-06 DIAGNOSIS — Z940 Kidney transplant status: Secondary | ICD-10-CM

## 2019-06-06 DIAGNOSIS — T82590A Other mechanical complication of surgically created arteriovenous fistula, initial encounter: Secondary | ICD-10-CM

## 2019-06-06 DIAGNOSIS — Z0000 Encounter for general adult medical examination without abnormal findings: Principal | ICD-10-CM

## 2019-06-06 LAB — URINALYSIS
BILIRUBIN UA: NEGATIVE
GLUCOSE UA: NEGATIVE
NITRITE UA: POSITIVE — AB
PH UA: 7 (ref 5.0–9.0)
SPECIFIC GRAVITY UA: 1.015 (ref 1.003–1.030)
SQUAMOUS EPITHELIAL: 1 /HPF (ref 0–5)
UROBILINOGEN UA: 0.2
WBC UA: 151 /HPF — ABNORMAL HIGH (ref 0–5)

## 2019-06-06 LAB — UROBILINOGEN UA: Lab: 0.2

## 2019-06-06 MED FILL — MYFORTIC 180 MG TABLET,DELAYED RELEASE: 30 days supply | Qty: 240 | Fill #0 | Status: AC

## 2019-06-06 NOTE — Unmapped (Signed)
PVL duplex order in and scheduled for 3:45 pm today. Pt to go from clinic to PVL lab. Providers will make her aware.

## 2019-06-06 NOTE — Unmapped (Signed)
Urine was collected and sent to the lab.

## 2019-06-07 MED ORDER — CIPROFLOXACIN 500 MG TABLET
ORAL_TABLET | Freq: Two times a day (BID) | ORAL | 1 refills | 7.00000 days | Status: CP
Start: 2019-06-07 — End: 2019-06-17

## 2019-06-07 NOTE — Unmapped (Signed)
Repeat UA Post Abx treatment with elevated WBC. Advised to resume cipro 500 mg BID pending culture results

## 2019-06-10 ENCOUNTER — Ambulatory Visit: Admit: 2019-06-10 | Discharge: 2019-06-11 | Payer: MEDICARE

## 2019-06-10 DIAGNOSIS — Z940 Kidney transplant status: Principal | ICD-10-CM

## 2019-06-10 DIAGNOSIS — Z0000 Encounter for general adult medical examination without abnormal findings: Secondary | ICD-10-CM

## 2019-06-10 LAB — CBC W/ AUTO DIFF
BASOPHILS ABSOLUTE COUNT: 0 10*9/L (ref 0.0–0.1)
BASOPHILS RELATIVE PERCENT: 0.6 %
EOSINOPHILS ABSOLUTE COUNT: 0.2 10*9/L (ref 0.0–0.4)
EOSINOPHILS RELATIVE PERCENT: 2.9 %
HEMATOCRIT: 30.3 % — ABNORMAL LOW (ref 36.0–46.0)
HEMOGLOBIN: 9.4 g/dL — ABNORMAL LOW (ref 13.5–16.0)
LYMPHOCYTES ABSOLUTE COUNT: 0.8 10*9/L — ABNORMAL LOW (ref 1.5–5.0)
LYMPHOCYTES RELATIVE PERCENT: 13.7 %
MEAN CORPUSCULAR HEMOGLOBIN CONC: 31.1 g/dL (ref 31.0–37.0)
MEAN CORPUSCULAR HEMOGLOBIN: 26.7 pg (ref 26.0–34.0)
MEAN CORPUSCULAR VOLUME: 85.8 fL (ref 80.0–100.0)
MEAN PLATELET VOLUME: 7.1 fL (ref 7.0–10.0)
MONOCYTES ABSOLUTE COUNT: 0.4 10*9/L (ref 0.2–0.8)
MONOCYTES RELATIVE PERCENT: 7.4 %
NEUTROPHILS ABSOLUTE COUNT: 4.2 10*9/L (ref 2.0–7.5)
NEUTROPHILS RELATIVE PERCENT: 73.7 %
PLATELET COUNT: 379 10*9/L (ref 150–440)
RED BLOOD CELL COUNT: 3.53 10*12/L — ABNORMAL LOW (ref 4.00–5.20)
RED CELL DISTRIBUTION WIDTH: 15.2 % — ABNORMAL HIGH (ref 12.0–15.0)

## 2019-06-10 LAB — URINALYSIS
BILIRUBIN UA: NEGATIVE
GLUCOSE UA: NEGATIVE
KETONES UA: NEGATIVE
LEUKOCYTE ESTERASE UA: NEGATIVE
NITRITE UA: NEGATIVE
PH UA: 6 (ref 5.0–9.0)
PROTEIN UA: NEGATIVE
RBC UA: 50 /HPF — ABNORMAL HIGH (ref <4)
SPECIFIC GRAVITY UA: 1.025 (ref 1.005–1.040)
SQUAMOUS EPITHELIAL: 2 /HPF (ref 0–5)
UROBILINOGEN UA: 0.2
WBC UA: 2 /HPF (ref 0–5)

## 2019-06-10 LAB — BASIC METABOLIC PANEL
ANION GAP: 6 mmol/L — ABNORMAL LOW (ref 7–15)
BLOOD UREA NITROGEN: 27 mg/dL — ABNORMAL HIGH (ref 7–21)
CALCIUM: 9.6 mg/dL (ref 8.5–10.2)
CHLORIDE: 109 mmol/L — ABNORMAL HIGH (ref 98–107)
CREATININE: 1.88 mg/dL — ABNORMAL HIGH (ref 0.60–1.00)
EGFR CKD-EPI AA FEMALE: 38 mL/min/{1.73_m2} — ABNORMAL LOW (ref >=60)
EGFR CKD-EPI NON-AA FEMALE: 33 mL/min/{1.73_m2} — ABNORMAL LOW (ref >=60)
GLUCOSE RANDOM: 89 mg/dL (ref 70–179)
POTASSIUM: 4.3 mmol/L (ref 3.5–5.0)
SODIUM: 137 mmol/L (ref 135–145)

## 2019-06-10 LAB — WBC UA: Lab: 2

## 2019-06-10 LAB — PHOSPHORUS: Phosphate:MCnc:Pt:Ser/Plas:Qn:: 2.6 — ABNORMAL LOW

## 2019-06-10 LAB — MAGNESIUM: Magnesium:MCnc:Pt:Ser/Plas:Qn:: 1.9

## 2019-06-10 LAB — EOSINOPHILS ABSOLUTE COUNT: Lab: 0.2

## 2019-06-10 LAB — ANION GAP: Anion gap 3:SCnc:Pt:Ser/Plas:Qn:: 6 — ABNORMAL LOW

## 2019-06-17 ENCOUNTER — Ambulatory Visit: Admit: 2019-06-17 | Discharge: 2019-06-18 | Payer: MEDICARE

## 2019-06-17 DIAGNOSIS — Z0000 Encounter for general adult medical examination without abnormal findings: Secondary | ICD-10-CM

## 2019-06-17 DIAGNOSIS — Z940 Kidney transplant status: Principal | ICD-10-CM

## 2019-06-17 LAB — URINALYSIS
BILIRUBIN UA: NEGATIVE
GLUCOSE UA: NEGATIVE
KETONES UA: NEGATIVE
LEUKOCYTE ESTERASE UA: NEGATIVE
NITRITE UA: NEGATIVE
PH UA: 6 (ref 5.0–9.0)
SPECIFIC GRAVITY UA: 1.02 (ref 1.005–1.040)
SQUAMOUS EPITHELIAL: 2 /HPF (ref 0–5)
UROBILINOGEN UA: 0.2
WBC UA: 2 /HPF (ref 0–5)

## 2019-06-17 LAB — CBC W/ AUTO DIFF
BASOPHILS ABSOLUTE COUNT: 0 10*9/L (ref 0.0–0.1)
BASOPHILS RELATIVE PERCENT: 0.4 %
EOSINOPHILS ABSOLUTE COUNT: 0.2 10*9/L (ref 0.0–0.4)
EOSINOPHILS RELATIVE PERCENT: 3 %
HEMATOCRIT: 30.7 % — ABNORMAL LOW (ref 36.0–46.0)
HEMOGLOBIN: 9.4 g/dL — ABNORMAL LOW (ref 13.5–16.0)
LARGE UNSTAINED CELLS: 2 % (ref 0–4)
LYMPHOCYTES ABSOLUTE COUNT: 0.8 10*9/L — ABNORMAL LOW (ref 1.5–5.0)
LYMPHOCYTES RELATIVE PERCENT: 15.3 %
MEAN CORPUSCULAR HEMOGLOBIN CONC: 30.7 g/dL — ABNORMAL LOW (ref 31.0–37.0)
MEAN CORPUSCULAR VOLUME: 84.3 fL (ref 80.0–100.0)
MEAN PLATELET VOLUME: 6.8 fL — ABNORMAL LOW (ref 7.0–10.0)
MONOCYTES ABSOLUTE COUNT: 0.4 10*9/L (ref 0.2–0.8)
MONOCYTES RELATIVE PERCENT: 7.5 %
NEUTROPHILS ABSOLUTE COUNT: 3.5 10*9/L (ref 2.0–7.5)
NEUTROPHILS RELATIVE PERCENT: 71.3 %
RED BLOOD CELL COUNT: 3.65 10*12/L — ABNORMAL LOW (ref 4.00–5.20)
RED CELL DISTRIBUTION WIDTH: 15.3 % — ABNORMAL HIGH (ref 12.0–15.0)
WBC ADJUSTED: 4.9 10*9/L (ref 4.5–11.0)

## 2019-06-17 LAB — BASIC METABOLIC PANEL
ANION GAP: 7 mmol/L (ref 7–15)
BLOOD UREA NITROGEN: 30 mg/dL — ABNORMAL HIGH (ref 7–21)
BUN / CREAT RATIO: 17
CALCIUM: 9.7 mg/dL (ref 8.5–10.2)
CHLORIDE: 109 mmol/L — ABNORMAL HIGH (ref 98–107)
CO2: 21 mmol/L — ABNORMAL LOW (ref 22.0–30.0)
EGFR CKD-EPI AA FEMALE: 42 mL/min/{1.73_m2} — ABNORMAL LOW (ref >=60)
EGFR CKD-EPI NON-AA FEMALE: 36 mL/min/{1.73_m2} — ABNORMAL LOW (ref >=60)
POTASSIUM: 3.9 mmol/L (ref 3.5–5.0)
SODIUM: 137 mmol/L (ref 135–145)

## 2019-06-17 LAB — MONOCYTES ABSOLUTE COUNT: Lab: 0.4

## 2019-06-17 LAB — MAGNESIUM: Magnesium:MCnc:Pt:Ser/Plas:Qn:: 1.9

## 2019-06-17 LAB — PHOSPHORUS: Phosphate:MCnc:Pt:Ser/Plas:Qn:: 3.1

## 2019-06-17 LAB — EGFR CKD-EPI NON-AA FEMALE: Lab: 36 — ABNORMAL LOW

## 2019-06-17 LAB — WBC UA: Lab: 2

## 2019-06-18 NOTE — Unmapped (Signed)
Called and made [place] apt at [facility] and confirmed they can do doppler US at 3pm.    Called and updated patient and she verbalized understanding

## 2019-06-19 ENCOUNTER — Ambulatory Visit: Admit: 2019-06-19 | Discharge: 2019-06-20 | Payer: MEDICARE

## 2019-06-19 DIAGNOSIS — Z940 Kidney transplant status: Principal | ICD-10-CM

## 2019-06-24 ENCOUNTER — Ambulatory Visit: Admit: 2019-06-24 | Discharge: 2019-06-25 | Payer: MEDICARE

## 2019-06-24 DIAGNOSIS — Z940 Kidney transplant status: Principal | ICD-10-CM

## 2019-06-24 DIAGNOSIS — Z0000 Encounter for general adult medical examination without abnormal findings: Secondary | ICD-10-CM

## 2019-06-24 LAB — CBC W/ AUTO DIFF
BASOPHILS ABSOLUTE COUNT: 0 10*9/L (ref 0.0–0.1)
BASOPHILS RELATIVE PERCENT: 0.4 %
EOSINOPHILS ABSOLUTE COUNT: 0.2 10*9/L (ref 0.0–0.4)
EOSINOPHILS RELATIVE PERCENT: 3.1 %
HEMATOCRIT: 31.6 % — ABNORMAL LOW (ref 36.0–46.0)
LARGE UNSTAINED CELLS: 2 % (ref 0–4)
LYMPHOCYTES ABSOLUTE COUNT: 0.8 10*9/L — ABNORMAL LOW (ref 1.5–5.0)
LYMPHOCYTES RELATIVE PERCENT: 15.1 %
MEAN CORPUSCULAR HEMOGLOBIN CONC: 30.8 g/dL — ABNORMAL LOW (ref 31.0–37.0)
MEAN CORPUSCULAR HEMOGLOBIN: 25.8 pg — ABNORMAL LOW (ref 26.0–34.0)
MEAN CORPUSCULAR VOLUME: 83.9 fL (ref 80.0–100.0)
MEAN PLATELET VOLUME: 6.7 fL — ABNORMAL LOW (ref 7.0–10.0)
MONOCYTES ABSOLUTE COUNT: 0.5 10*9/L (ref 0.2–0.8)
MONOCYTES RELATIVE PERCENT: 9 %
NEUTROPHILS ABSOLUTE COUNT: 3.5 10*9/L (ref 2.0–7.5)
NEUTROPHILS RELATIVE PERCENT: 70.5 %
RED CELL DISTRIBUTION WIDTH: 15.2 % — ABNORMAL HIGH (ref 12.0–15.0)
WBC ADJUSTED: 5 10*9/L (ref 4.5–11.0)

## 2019-06-24 LAB — URINALYSIS
BACTERIA: NONE SEEN /HPF
BILIRUBIN UA: NEGATIVE
GLUCOSE UA: NEGATIVE
KETONES UA: NEGATIVE
LEUKOCYTE ESTERASE UA: NEGATIVE
NITRITE UA: NEGATIVE
RBC UA: 50 /HPF — ABNORMAL HIGH (ref <4)
SQUAMOUS EPITHELIAL: 1 /HPF (ref 0–5)
UROBILINOGEN UA: 0.2
WBC UA: 0 /HPF (ref 0–5)

## 2019-06-24 LAB — PHOSPHORUS: Phosphate:MCnc:Pt:Ser/Plas:Qn:: 2.9

## 2019-06-24 LAB — BASIC METABOLIC PANEL
ANION GAP: 3 mmol/L — ABNORMAL LOW (ref 7–15)
CALCIUM: 9.8 mg/dL (ref 8.5–10.2)
CHLORIDE: 112 mmol/L — ABNORMAL HIGH (ref 98–107)
CO2: 23 mmol/L (ref 22.0–30.0)
CREATININE: 1.66 mg/dL — ABNORMAL HIGH (ref 0.60–1.00)
EGFR CKD-EPI AA FEMALE: 44 mL/min/{1.73_m2} — ABNORMAL LOW (ref >=60)
EGFR CKD-EPI NON-AA FEMALE: 39 mL/min/{1.73_m2} — ABNORMAL LOW (ref >=60)
GLUCOSE RANDOM: 96 mg/dL (ref 70–179)
POTASSIUM: 4.2 mmol/L (ref 3.5–5.0)
SODIUM: 138 mmol/L (ref 135–145)

## 2019-06-24 LAB — EGFR CKD-EPI AA FEMALE: Lab: 44 — ABNORMAL LOW

## 2019-06-24 LAB — MAGNESIUM: Magnesium:MCnc:Pt:Ser/Plas:Qn:: 2

## 2019-06-24 LAB — RBC UA: Erythrocytes:Naric:Pt:Urine sed:Qn:Microscopy.light.HPF: 50 — ABNORMAL HIGH

## 2019-06-24 LAB — LARGE UNSTAINED CELLS: Lab: 2

## 2019-06-26 NOTE — Unmapped (Signed)
Dialysis Access Coordinator Note:  Kimberly [name], DOB: [birth year]    Issue/Concern:  Scheduled for consult possible revision??    Current Dialysis Access:   L arm fistula?? Per Dr Kimberly [name] note 05/17/2019    Previous dialysis access history:   unknown    Vessel Mapping/Studies:   Duplex 06/06/2019 [place]    NOTES:   06/26/2019  Duplex reviewed by team and no action ordered at this time. Was obtained in the event of future banding and to assess. See Kimberly [name] note from 06/06/2019 visit for details.    05/29/2019  Pt is scheduled to see Dr Kimberly [name] for consult possible fistula revision on 06/06/2019?? Only 175d post txp. Initial pt referral was a direct schedule and was not processed by me.    PMH: kidney txp 12/05/2018  Labs on 05/27/2019: sCr 1.83; GFR 35    Nephrologist: Kimberly Cogan, MD    Dialysis Center:  n/a  Dialysis Days:   n/a    Pt lives in: [name], [name]    Kimberly [name], [place]  Dialysis/Vascular Access Coordinator   [place] [facility]  [address] [place], [name] [address]  Desk: [phone]  Fax: [phone]

## 2019-06-27 NOTE — Unmapped (Signed)
[facility] Refill Coordination Note    Specialty Medication(s) to be Shipped:   Transplant: Myfortic 180mg  and Prednisone 2.5mg     Other medication(s) to be shipped:   Omeprazole 40mg   Calcitriol      RENIE STELMACH, DOB: September 07, 1981  Phone: [phone] (home)       All above HIPAA information was verified with patient.     Completed refill call assessment today to schedule patient's medication shipment from the [facility] [phone]).       Specialty medication(s) and dose(s) confirmed: Regimen is correct and unchanged.   Changes to medications: Kizzy reports no changes at this time.  Changes to insurance: No  Questions for the pharmacist: No    Confirmed patient received Welcome Packet with first shipment. The patient will receive a drug information handout for each medication shipped and additional FDA Medication Guides as required.       DISEASE/MEDICATION-SPECIFIC INFORMATION        N/A    SPECIALTY MEDICATION ADHERENCE     Medication Adherence    Patient reported X missed doses in the last month: 0         Myfortic 180mg ; 10 days on hand  Prednisone 2.5mg : 0 days on hand. Finishing off 10mg  tablets.     SHIPPING     Shipping address confirmed in Epic.     Delivery Scheduled: Yes, Expected medication delivery date: 07/03/19.     Medication will be delivered via UPS to the home address in Epic [place].    [place] A Maor Meckel   [facility] Specialty Technician

## 2019-06-28 ENCOUNTER — Ambulatory Visit: Admit: 2019-06-28 | Discharge: 2019-06-29 | Payer: MEDICARE

## 2019-06-28 DIAGNOSIS — N3001 Acute cystitis with hematuria: Secondary | ICD-10-CM

## 2019-06-28 DIAGNOSIS — Z79899 Other long term (current) drug therapy: Secondary | ICD-10-CM

## 2019-06-28 DIAGNOSIS — Z0000 Encounter for general adult medical examination without abnormal findings: Secondary | ICD-10-CM

## 2019-06-28 DIAGNOSIS — Z940 Kidney transplant status: Secondary | ICD-10-CM

## 2019-06-28 DIAGNOSIS — N10 Acute tubulo-interstitial nephritis: Secondary | ICD-10-CM

## 2019-06-28 LAB — URINALYSIS
BILIRUBIN UA: NEGATIVE
GLUCOSE UA: NEGATIVE
KETONES UA: NEGATIVE
NITRITE UA: POSITIVE — AB
PH UA: 5 (ref 5.0–9.0)
PROTEIN UA: 30 — AB
RBC UA: 35 /HPF — ABNORMAL HIGH (ref <=4)
SPECIFIC GRAVITY UA: 1.017 (ref 1.003–1.030)
SQUAMOUS EPITHELIAL: <1 /HPF (ref 0–5)
UROBILINOGEN UA: 0.2
WBC UA: >182 /HPF — ABNORMAL HIGH (ref 0–5)

## 2019-06-28 LAB — SPECIFIC GRAVITY UA: Specific gravity:Rden:Pt:Urine:Qn:: 1.017

## 2019-06-28 MED ORDER — CIPROFLOXACIN 500 MG TABLET
ORAL_TABLET | Freq: Two times a day (BID) | ORAL | 1 refills | 7.00000 days | Status: CP
Start: 2019-06-28 — End: 2019-07-03

## 2019-06-28 NOTE — Unmapped (Addendum)
Pt called to report pain and burning with urination. Advised to leave UA/UC with clinic visit today and start cipro 500mg  BID X 7 days and repeat UA/UC post treatment

## 2019-06-28 NOTE — Unmapped (Signed)
1330 Patient arrived Transplant Infusion Room today for belatacept, Condition: well; Mobility: ambulating; accompanied by self.   See Flowsheet and MAR for all details of visit.   1338 VS stable.  1400  PIV placed, NS KVO; labs no orders found, urine collected and sent.  1405 Infusion initiated.  1435 Infusion complete.  VS stable, PIV removed, pt left clinic, Condition: well; Mobility: ambulating; accompanied by self.

## 2019-07-02 ENCOUNTER — Ambulatory Visit: Admit: 2019-07-02 | Discharge: 2019-07-03 | Payer: MEDICARE

## 2019-07-02 DIAGNOSIS — Z940 Kidney transplant status: Secondary | ICD-10-CM

## 2019-07-02 LAB — BASIC METABOLIC PANEL
ANION GAP: 6 mmol/L — ABNORMAL LOW (ref 7–15)
BLOOD UREA NITROGEN: 23 mg/dL — ABNORMAL HIGH (ref 7–21)
CALCIUM: 9.5 mg/dL (ref 8.5–10.2)
CHLORIDE: 114 mmol/L — ABNORMAL HIGH (ref 98–107)
CO2: 23 mmol/L (ref 22.0–30.0)
EGFR CKD-EPI AA FEMALE: 45 mL/min/{1.73_m2} — ABNORMAL LOW (ref >=60)
EGFR CKD-EPI NON-AA FEMALE: 39 mL/min/{1.73_m2} — ABNORMAL LOW (ref >=60)
GLUCOSE RANDOM: 97 mg/dL (ref 70–179)
POTASSIUM: 4.3 mmol/L (ref 3.5–5.0)
SODIUM: 143 mmol/L (ref 135–145)

## 2019-07-02 LAB — MAGNESIUM
MAGNESIUM: 1.9 mg/dL (ref 1.6–2.2)
Magnesium:MCnc:Pt:Ser/Plas:Qn:: 1.9

## 2019-07-02 LAB — CBC W/ AUTO DIFF
BASOPHILS ABSOLUTE COUNT: 0 10*9/L (ref 0.0–0.1)
EOSINOPHILS ABSOLUTE COUNT: 0.1 10*9/L (ref 0.0–0.4)
EOSINOPHILS RELATIVE PERCENT: 2.2 %
HEMATOCRIT: 31.7 % — ABNORMAL LOW (ref 36.0–46.0)
HEMOGLOBIN: 9.9 g/dL — ABNORMAL LOW (ref 13.5–16.0)
LARGE UNSTAINED CELLS: 2 % (ref 0–4)
LYMPHOCYTES ABSOLUTE COUNT: 0.7 10*9/L — ABNORMAL LOW (ref 1.5–5.0)
LYMPHOCYTES RELATIVE PERCENT: 15 %
MEAN CORPUSCULAR HEMOGLOBIN CONC: 31.3 g/dL (ref 31.0–37.0)
MEAN CORPUSCULAR HEMOGLOBIN: 25.9 pg — ABNORMAL LOW (ref 26.0–34.0)
MEAN CORPUSCULAR VOLUME: 82.8 fL (ref 80.0–100.0)
MEAN PLATELET VOLUME: 6.7 fL — ABNORMAL LOW (ref 7.0–10.0)
MONOCYTES ABSOLUTE COUNT: 0.4 10*9/L (ref 0.2–0.8)
MONOCYTES RELATIVE PERCENT: 9.7 %
NEUTROPHILS RELATIVE PERCENT: 70.5 %
PLATELET COUNT: 377 10*9/L (ref 150–440)
RED BLOOD CELL COUNT: 3.82 10*12/L — ABNORMAL LOW (ref 4.00–5.20)
WBC ADJUSTED: 4.4 10*9/L — ABNORMAL LOW (ref 4.5–11.0)

## 2019-07-02 LAB — BUN / CREAT RATIO: Urea nitrogen/Creatinine:MRto:Pt:Ser/Plas:Qn:: 14

## 2019-07-02 LAB — PHOSPHORUS: Phosphate:MCnc:Pt:Ser/Plas:Qn:: 2.8 — ABNORMAL LOW

## 2019-07-02 LAB — RED BLOOD CELL COUNT: Lab: 3.82 — ABNORMAL LOW

## 2019-07-02 MED FILL — CALCITRIOL 0.25 MCG CAPSULE: ORAL | 30 days supply | Qty: 30 | Fill #6

## 2019-07-02 MED FILL — MYFORTIC 180 MG TABLET,DELAYED RELEASE: 30 days supply | Qty: 240 | Fill #1 | Status: AC

## 2019-07-02 MED FILL — MYFORTIC 180 MG TABLET,DELAYED RELEASE: ORAL | 30 days supply | Qty: 240 | Fill #1

## 2019-07-02 MED FILL — OMEPRAZOLE 40 MG CAPSULE,DELAYED RELEASE: ORAL | 30 days supply | Qty: 30 | Fill #6

## 2019-07-02 MED FILL — OMEPRAZOLE 40 MG CAPSULE,DELAYED RELEASE: 30 days supply | Qty: 30 | Fill #6 | Status: AC

## 2019-07-02 MED FILL — PREDNISONE 2.5 MG TABLET: 30 days supply | Qty: 90 | Fill #0 | Status: AC

## 2019-07-02 MED FILL — CALCITRIOL 0.25 MCG CAPSULE: 30 days supply | Qty: 30 | Fill #6 | Status: AC

## 2019-07-03 ENCOUNTER — Telehealth: Admit: 2019-07-03 | Discharge: 2019-07-04 | Payer: MEDICARE | Attending: Nephrology | Primary: Nephrology

## 2019-07-03 DIAGNOSIS — N3001 Acute cystitis with hematuria: Secondary | ICD-10-CM

## 2019-07-03 MED ORDER — CIPROFLOXACIN 500 MG TABLET
ORAL_TABLET | Freq: Two times a day (BID) | ORAL | 0 refills | 21 days | Status: CP
Start: 2019-07-03 — End: 2019-07-24

## 2019-07-03 NOTE — Unmapped (Signed)
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  2nd kidney transplant - 2000-2002 - DD failed chronic rejection.  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 Her last dialysis was on 03/07/19.  Has a good U.O.  - Was seen by vascular access in the interim to evaluate her left arm swelling.     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    Creatinine - value 1.64 on 07/02/19, stable  Urine analysis: protein 30 mg/dl / WBC >161 / RBC 35 (09/60/45), Urine culture found >100,000 CFU/mL Klebsiella pneumoniae  Urine protein/creatinine ratio: 0.746 (12/21/18)  DSA: negative - 12/24/18    Last CMV checked: not detected - 12/17/18  Last BKV checked: No decoy cells 05/17/19    Prophylaxis -  Bactrim 80 mg TIW (until 06/05/19)    2) Immunosuppression: Myfortic 720 mg BID / Prednisone 10 mg daily / Belatacept monthly (last on 06/28/19)  - Changes in Immunosuppression - Will decrease prednisone to 5 mg daily  - On belatacept due to biopsy findings.  - Medications side effects: No    3) UTI  - Patient now has third recent UTI.   - Currently on 7 day course of cipro. I extended this prescription to 21 days. I advised the patient to take this for 14 days and will contact her if she should take for 21 days.   - Will refer to urogyn given risk of future antibiotic resistance     4) Hypotension - Controlled - off midodrine now.  Goal for B.P > 100 systolic.  Changes in B.P medications - none.    5) Anemia - stable - last Hgb 9.9 on 07/02/19   Goal for Hemoglobin > 11.0  Ferritin 34.1 (02/25/19)  - On iron supplement now taking BID, recently decreased due to constipation. Daily Hgb is trending up, also previously suggested she look up iron-rich foods to incorporate into her diet    6) MBD - Calcium 9.5 / Phosphorus 2.8 - Secondary hyperparathyroidism  On calcitriol 0.25 mcg daily  iPTH - 73.8 (12/05/18)    7) Electrolytes: Bicarb low (21.0)  - Will decrease bicarb from 650mg  BID to 650mg  daily.     8) Immunizations:  Influenza (inactivated only): 08/09/18  Pneumococcal vaccination (inactivated only):     9) Cancer screening:  PAP smear - 3/15 negative   Mammogram - Age 31.    Follow up: 2 months or sooner if indicated.    I spent 7 minutes on video/audio with the patient. I spent an additional 10 minutes on pre- and post-visit activities.     The patient was physically located in [place] or a state in which I am permitted to provide care. The patient understood that s/he may incur co-pays and cost sharing, and agreed to the telemedicine visit. The visit was completed via phone and/or  video, which was appropriate and reasonable under the circumstances given the patient's presentation at the time.    The patient has been advised of the potential risks and limitations of this mode of treatment (including, but not limited to, the absence of in-person examination) and has agreed to be treated using telemedicine. The patient's/patient's family's questions regarding telemedicine have been answered.     If the phone/video visit was completed in an ambulatory setting, the patient has also been advised to contact their provider???s office for worsening conditions, and seek emergency medical treatment and/or call 911 if the patient deems either necessary.      History of Presenting Illness:     Since patient's last visit in the transplant clinic - patient has been doing well in terms of transplant, taking transplant medications regularly, no episodes of rejection and no side effects of medications.    She received belatacept infusions 375 mg on 05/31/19 and 06/28/19. 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    Her visit today was conducted via video due to the ongoing COVID-19 pandemic.     Today, the patient reports she is currently on a 7-day cipro regimen for a UTI, which is her third UTI recently. She also reports she is continuing to experience some constipation although this has improved since decreasing her iron supplement. She reports her BP at home has been 110's-130's/60-80's. She reports she has been getting blood work done about once a week.     She denies chest pain or tightness, fever, chills, nausea, vomiting, headache, lightheadedness, difficulty voiding, or reduced appetite.      Diabetes: No   HTN: Yes: Hypotension    Controlled:Yes   Adherence      With Medication: yes    With Follow up: yes    Functional Status: independent    Patient was found to have reflux nephropathy at age 53 and was on peritoneal dialysis briefly before receiving a living related renal transplant from mother. She had some thrombotic complication and underwent nephrectomy in 1997. She started dialysis and had a second kidney transplant at age 37 in 13. 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 She received a 3rd kidney transplant in 11/2002 which failed in 2007 due to chronic rejection.    She has decreased vision both eyes.    Review of Systems:     Fever or chills: negative   Sore throat: negative   Fatigue/malaise: negative   Weight loss or gain: negative   New skin rash/lump or bump: negative   Problems with teeth/gums: negative   Chest pain: negative   Cough or shortness of breath: negative   Swelling: negative.  Abdominal pain/heartburn/nausea/vomiting or diarrhea: negative   Pain or bleeding when urinating: negative   Twitching/numbness or weakness: negative     Physical Exam:   Not performed due to video visit.    Renal Transplant History:    Race:  Caucasian   Age of recipient (at time of transplant): 38 y.o.   Cause of kidney disease: Reflux nephropathy   Native biopsy: No    Date of transplant: 12/05/18   Type of transplant: KDPI - 39%    - Donor creatinine: 1.3    - Any co  morbidities: No    - Infection in donor: No    - HLA Mismatch: N/A    - Ischemia time: 6h 9m    - Crossmatch: negative    - Donor kidney biopsy: No diagnostic abnormalities identified (12/05/18)     Induction: [place] - alemtuzumab   Maintenance IS at the time of transplant: tacrolimus, mycophenolate   DGF: Yes   Diabetes Onset after transplant: No    Allergies:   Allergies   Allergen Reactions   ??? Ancef [Cefazolin] Shortness Of Breath   ??? Adhesive Tape-Silicones      Other reaction(s): Other (See Comments)  Uncoded Allergy. Allergen: plastic tape, Other Reaction: blistering   ??? Demerol [Meperidine] Nausea And Vomiting        Current Medications:   Current Outpatient Medications   Medication Sig Dispense Refill   ??? acetaminophen (TYLENOL) 500 MG tablet Take 2 tablets (1,000 mg total) by mouth every six (6) hours as needed for pain. 100 tablet 0   ??? aspirin (ECOTRIN) 81 MG tablet Take 1 tablet (81 mg total) by mouth daily. 30 tablet 11   ??? calcitrioL (ROCALTROL) 0.25 MCG capsule Take 1 capsule (0.25 mcg total) by mouth daily. 30 capsule 11   ??? carboxymethylcellulose sodium (REFRESH CELLUVISC) 1 % DpGe Instill drops in affected eye(s) as directed as needed.     ??? ciprofloxacin HCl (CIPRO) 500 MG tablet Take 1 tablet (500 mg total) by mouth Two (2) times a day for 21 days. 42 tablet 0   ??? ferrous sulfate 325 (65 FE) MG tablet Take 325 mg by mouth daily.     ??? MYFORTIC 180 mg EC tablet Take 4 tablets (720 mg total) by mouth Two (2) times a day. 240 tablet 11   ??? omeprazole (PRILOSEC) 40 MG capsule Take 1 capsule (40 mg total) by mouth daily. 30 capsule 11   ??? predniSONE (DELTASONE) 2.5 MG tablet Take 3 tablets (7.5 mg total) by mouth daily. 90 tablet 11   ??? sodium bicarbonate 650 mg tablet Take 1 tablet (650 mg total) by mouth Two (2) times a day. 180 tablet 3     No current facility-administered medications for this visit.        Past Medical History:   Past Medical History:   Diagnosis Date   ??? Bell's palsy    ??? Disease of thyroid gland    ??? ESRD (end stage renal disease) (CMS-HCC)    ??? Nonarteritic ischemic optic neuropathy    ??? Reflux nephropathy         Laboratory studies:     Recent Results (from the past 170 hour(s))   Urine Culture    Collection Time: 06/28/19  1:02 PM    Specimen: Clean Catch; Urine   Result Value Ref Range    Urine Culture, Comprehensive >100,000 CFU/mL Klebsiella pneumoniae (A)        Susceptibility    Klebsiella pneumoniae - KIRBY BAUER     Ampicillin  Resistant      Ampicillin + Sulbactam  Intermediate      Cefazolin  Resistant      Cephalexin*  Resistant       * For uncomplicated UTI's only.  Also predicts response for cefdinir, cefpodoxime and cefuroxime.      Ceftriaxone  Intermediate      Ciprofloxacin  Susceptible      Gentamicin  Susceptible      Meropenem  Susceptible      Levofloxacin  Susceptible  Ertapenem  Susceptible      Nitrofurantoin  Intermediate      Tobramycin  Resistant      Trimethoprim + Sulfamethoxazole  Resistant    Urinalysis    Collection Time: 06/28/19  1:02 PM   Result Value Ref Range    Color, UA Yellow     Clarity, UA Cloudy     Specific Gravity, UA 1.017 1.003 - 1.030    pH, UA 5.0 5.0 - 9.0    Leukocyte Esterase, UA Large (A) Negative    Nitrite, UA Positive (A) Negative    Protein, UA 30 mg/dL (A) Negative    Glucose, UA Negative Negative    Ketones, UA Negative Negative    Urobilinogen, UA 0.2 mg/dL 0.2 mg/dL, 1.0 mg/dL Bilirubin, UA Negative Negative    Blood, UA Moderate (A) Negative    RBC, UA 35 (H) <=4 /HPF    WBC, UA >182 (H) 0 - 5 /HPF    Squam Epithel, UA <1 0 - 5 /HPF    Bacteria, UA Many (A) None Seen /HPF    Mucus, UA Rare (A) None Seen /HPF   Magnesium Level    Collection Time: 07/02/19  8:17 AM   Result Value Ref Range    Magnesium 1.9 1.6 - 2.2 mg/dL   Phosphorus Level    Collection Time: 07/02/19  8:17 AM   Result Value Ref Range    Phosphorus 2.8 (L) 2.9 - 4.7 mg/dL   Basic Metabolic Panel    Collection Time: 07/02/19  8:17 AM   Result Value Ref Range    Sodium 143 135 - 145 mmol/L    Potassium 4.3 3.5 - 5.0 mmol/L    Chloride 114 (H) 98 - 107 mmol/L    CO2 23.0 22.0 - 30.0 mmol/L    Anion Gap 6 (L) 7 - 15 mmol/L    BUN 23 (H) 7 - 21 mg/dL    Creatinine 1.61 (H) 0.60 - 1.00 mg/dL    BUN/Creatinine Ratio 14     EGFR CKD-EPI Non-African American, Female 39 (L) >=60 mL/min/1.20m2    EGFR CKD-EPI African American, Female 45 (L) >=60 mL/min/1.62m2    Glucose 97 70 - 179 mg/dL    Calcium 9.5 8.5 - 09.6 mg/dL   CBC w/ Differential    Collection Time: 07/02/19  8:17 AM   Result Value Ref Range    WBC 4.4 (L) 4.5 - 11.0 10*9/L    RBC 3.82 (L) 4.00 - 5.20 10*12/L    HGB 9.9 (L) 13.5 - 16.0 g/dL    HCT 04.5 (L) 40.9 - 46.0 %    MCV 82.8 80.0 - 100.0 fL    MCH 25.9 (L) 26.0 - 34.0 pg    MCHC 31.3 31.0 - 37.0 g/dL    RDW 81.1 91.4 - 78.2 %    MPV 6.7 (L) 7.0 - 10.0 fL    Platelet 377 150 - 440 10*9/L    Neutrophils % 70.5 %    Lymphocytes % 15.0 %    Monocytes % 9.7 %    Eosinophils % 2.2 %    Basophils % 0.6 %    Absolute Neutrophils 3.1 2.0 - 7.5 10*9/L    Absolute Lymphocytes 0.7 (L) 1.5 - 5.0 10*9/L    Absolute Monocytes 0.4 0.2 - 0.8 10*9/L    Absolute Eosinophils 0.1 0.0 - 0.4 10*9/L    Absolute Basophils 0.0 0.0 - 0.1 10*9/L    Large Unstained Cells 2 0 -  4 %    Hypochromasia Moderate (A) Not Present      Scribe's Attestation: Leeroy Bock, MD obtained and performed the history, physical exam and medical decision making elements that were entered into the chart.  Signed by Napoleon Form, Scribe, on July 03, 2019 at 9:39 AM.    Documentation assistance provided by the Scribe. I was present during the time the encounter was recorded. The information recorded by the Scribe was done at my direction and has been reviewed and validated by me.

## 2019-07-04 NOTE — Unmapped (Signed)
DGF HD completed 03/14/19

## 2019-07-08 ENCOUNTER — Ambulatory Visit: Admit: 2019-07-08 | Discharge: 2019-07-09 | Payer: MEDICARE

## 2019-07-08 DIAGNOSIS — Z79899 Other long term (current) drug therapy: Secondary | ICD-10-CM

## 2019-07-08 DIAGNOSIS — Z0000 Encounter for general adult medical examination without abnormal findings: Secondary | ICD-10-CM

## 2019-07-08 DIAGNOSIS — Z4822 Encounter for aftercare following kidney transplant: Secondary | ICD-10-CM

## 2019-07-08 DIAGNOSIS — Z940 Kidney transplant status: Secondary | ICD-10-CM

## 2019-07-08 LAB — MAGNESIUM: Magnesium:MCnc:Pt:Ser/Plas:Qn:: 2

## 2019-07-08 LAB — URINALYSIS
BACTERIA: NONE SEEN /HPF
BILIRUBIN UA: NEGATIVE
GLUCOSE UA: NEGATIVE
KETONES UA: NEGATIVE
LEUKOCYTE ESTERASE UA: NEGATIVE
NITRITE UA: NEGATIVE
PH UA: 6 (ref 5.0–9.0)
PROTEIN UA: NEGATIVE
RBC UA: 45 /HPF — ABNORMAL HIGH (ref <4)
SQUAMOUS EPITHELIAL: 5 /HPF (ref 0–5)
UROBILINOGEN UA: 0.2
WBC UA: 8 /HPF — ABNORMAL HIGH (ref 0–5)

## 2019-07-08 LAB — CBC W/ AUTO DIFF
BASOPHILS ABSOLUTE COUNT: 0 10*9/L (ref 0.0–0.1)
BASOPHILS RELATIVE PERCENT: 0.5 %
EOSINOPHILS ABSOLUTE COUNT: 0.1 10*9/L (ref 0.0–0.4)
EOSINOPHILS RELATIVE PERCENT: 2.4 %
HEMOGLOBIN: 10.3 g/dL — ABNORMAL LOW (ref 13.5–16.0)
LARGE UNSTAINED CELLS: 2 % (ref 0–4)
LYMPHOCYTES ABSOLUTE COUNT: 0.9 10*9/L — ABNORMAL LOW (ref 1.5–5.0)
LYMPHOCYTES RELATIVE PERCENT: 15.5 %
MEAN CORPUSCULAR HEMOGLOBIN CONC: 32.1 g/dL (ref 31.0–37.0)
MEAN CORPUSCULAR HEMOGLOBIN: 25.4 pg — ABNORMAL LOW (ref 26.0–34.0)
MEAN CORPUSCULAR VOLUME: 79.1 fL — ABNORMAL LOW (ref 80.0–100.0)
MEAN PLATELET VOLUME: 6.2 fL — ABNORMAL LOW (ref 7.0–10.0)
MONOCYTES ABSOLUTE COUNT: 0.5 10*9/L (ref 0.2–0.8)
MONOCYTES RELATIVE PERCENT: 8.2 %
NEUTROPHILS ABSOLUTE COUNT: 4 10*9/L (ref 2.0–7.5)
NEUTROPHILS RELATIVE PERCENT: 71.3 %
PLATELET COUNT: 450 10*9/L — ABNORMAL HIGH (ref 150–440)
RED BLOOD CELL COUNT: 4.05 10*12/L (ref 4.00–5.20)
RED CELL DISTRIBUTION WIDTH: 14.7 % (ref 12.0–15.0)
WBC ADJUSTED: 5.7 10*9/L (ref 4.5–11.0)

## 2019-07-08 LAB — BASIC METABOLIC PANEL
ANION GAP: 10 mmol/L (ref 7–15)
BLOOD UREA NITROGEN: 31 mg/dL — ABNORMAL HIGH (ref 7–21)
BUN / CREAT RATIO: 16
CALCIUM: 10.3 mg/dL — ABNORMAL HIGH (ref 8.5–10.2)
CHLORIDE: 107 mmol/L (ref 98–107)
CREATININE: 1.88 mg/dL — ABNORMAL HIGH (ref 0.60–1.00)
EGFR CKD-EPI AA FEMALE: 38 mL/min/{1.73_m2} — ABNORMAL LOW (ref >=60)
EGFR CKD-EPI NON-AA FEMALE: 33 mL/min/{1.73_m2} — ABNORMAL LOW (ref >=60)
GLUCOSE RANDOM: 96 mg/dL (ref 70–179)
POTASSIUM: 4.8 mmol/L (ref 3.5–5.0)
SODIUM: 141 mmol/L (ref 135–145)

## 2019-07-08 LAB — PHOSPHORUS
PHOSPHORUS: 3.7 mg/dL (ref 2.9–4.7)
Phosphate:MCnc:Pt:Ser/Plas:Qn:: 3.7

## 2019-07-08 LAB — PROTEIN/CREAT RATIO, URINE: Protein/Creatinine:MRto:Pt:Urine:Qn:: 0.113

## 2019-07-08 LAB — MONOCYTES RELATIVE PERCENT: Lab: 8.2

## 2019-07-08 LAB — WBC UA: Leukocytes:Naric:Pt:Urine sed:Qn:Microscopy.light.HPF: 8 — ABNORMAL HIGH

## 2019-07-08 LAB — EGFR CKD-EPI NON-AA FEMALE: Lab: 33 — ABNORMAL LOW

## 2019-07-10 LAB — CMV DNA, QUANTITATIVE, PCR
CMV QUANT LOG10: 1.79 {Log_IU}/mL — ABNORMAL HIGH (ref <0.00)
CMV QUANT: 62 [IU]/mL — ABNORMAL HIGH (ref <0)

## 2019-07-10 LAB — CMV QUANT: Lab: 62 — ABNORMAL HIGH

## 2019-07-11 LAB — HLA DS POST TRANSPLANT
ANTI-DONOR DRW #1 MFI: 296 MFI
ANTI-DONOR DRW #2 MFI: 134 MFI
ANTI-DONOR HLA-A #1 MFI: 57 MFI
ANTI-DONOR HLA-A #2 MFI: 187 MFI
ANTI-DONOR HLA-B #1 MFI: 699 MFI
ANTI-DONOR HLA-B #2 MFI: 146 MFI
ANTI-DONOR HLA-C #1 MFI: 79 MFI
ANTI-DONOR HLA-C #2 MFI: 10 MFI
ANTI-DONOR HLA-DP #2 MFI: 90 MFI
ANTI-DONOR HLA-DQB #1 MFI: 3 MFI
ANTI-DONOR HLA-DQB #2 MFI: 91 MFI
ANTI-DONOR HLA-DR #1 MFI: 49 MFI
ANTI-DONOR HLA-DR #2 MFI: 270 MFI

## 2019-07-11 LAB — HLA CLASS 1 ANTIBODY RESULT: Lab: POSITIVE

## 2019-07-11 LAB — FSAB CLASS 1 ANTIBODY SPECIFICITY

## 2019-07-11 LAB — DONOR HLA-A ANTIGEN #1

## 2019-07-11 LAB — FSAB CLASS 2 ANTIBODY SPECIFICITY: HLA CL2 AB RESULT: POSITIVE

## 2019-07-11 LAB — HLA CL2 AB RESULT: Lab: POSITIVE

## 2019-07-15 ENCOUNTER — Ambulatory Visit: Admit: 2019-07-15 | Discharge: 2019-07-16 | Payer: MEDICARE

## 2019-07-15 DIAGNOSIS — Z940 Kidney transplant status: Secondary | ICD-10-CM

## 2019-07-15 DIAGNOSIS — Z0000 Encounter for general adult medical examination without abnormal findings: Secondary | ICD-10-CM

## 2019-07-15 LAB — BASIC METABOLIC PANEL
BLOOD UREA NITROGEN: 20 mg/dL (ref 7–21)
BUN / CREAT RATIO: 11
CALCIUM: 9.6 mg/dL (ref 8.5–10.2)
CHLORIDE: 109 mmol/L — ABNORMAL HIGH (ref 98–107)
CO2: 23 mmol/L (ref 22.0–30.0)
CREATININE: 1.87 mg/dL — ABNORMAL HIGH (ref 0.60–1.00)
EGFR CKD-EPI AA FEMALE: 38 mL/min/{1.73_m2} — ABNORMAL LOW (ref >=60)
EGFR CKD-EPI NON-AA FEMALE: 33 mL/min/{1.73_m2} — ABNORMAL LOW (ref >=60)
GLUCOSE RANDOM: 96 mg/dL (ref 70–179)
POTASSIUM: 4.6 mmol/L (ref 3.5–5.0)
SODIUM: 142 mmol/L (ref 135–145)

## 2019-07-15 LAB — CBC W/ AUTO DIFF
BASOPHILS ABSOLUTE COUNT: 0 10*9/L (ref 0.0–0.1)
EOSINOPHILS ABSOLUTE COUNT: 0.1 10*9/L (ref 0.0–0.4)
EOSINOPHILS RELATIVE PERCENT: 2.8 %
HEMATOCRIT: 31.3 % — ABNORMAL LOW (ref 36.0–46.0)
HEMOGLOBIN: 10.1 g/dL — ABNORMAL LOW (ref 13.5–16.0)
LARGE UNSTAINED CELLS: 3 % (ref 0–4)
LYMPHOCYTES ABSOLUTE COUNT: 0.8 10*9/L — ABNORMAL LOW (ref 1.5–5.0)
LYMPHOCYTES RELATIVE PERCENT: 18.6 %
MEAN CORPUSCULAR HEMOGLOBIN CONC: 32.1 g/dL (ref 31.0–37.0)
MEAN CORPUSCULAR HEMOGLOBIN: 25.2 pg — ABNORMAL LOW (ref 26.0–34.0)
MEAN CORPUSCULAR VOLUME: 78.4 fL — ABNORMAL LOW (ref 80.0–100.0)
MEAN PLATELET VOLUME: 6.5 fL — ABNORMAL LOW (ref 7.0–10.0)
MONOCYTES ABSOLUTE COUNT: 0.4 10*9/L (ref 0.2–0.8)
MONOCYTES RELATIVE PERCENT: 9.3 %
NEUTROPHILS ABSOLUTE COUNT: 2.7 10*9/L (ref 2.0–7.5)
NEUTROPHILS RELATIVE PERCENT: 65.9 %
PLATELET COUNT: 363 10*9/L (ref 150–440)
RED CELL DISTRIBUTION WIDTH: 14.6 % (ref 12.0–15.0)
WBC ADJUSTED: 4.1 10*9/L — ABNORMAL LOW (ref 4.5–11.0)

## 2019-07-15 LAB — PHOSPHORUS: Phosphate:MCnc:Pt:Ser/Plas:Qn:: 3

## 2019-07-15 LAB — CHLORIDE: Chloride:SCnc:Pt:Ser/Plas:Qn:: 109 — ABNORMAL HIGH

## 2019-07-15 LAB — MAGNESIUM: Magnesium:MCnc:Pt:Ser/Plas:Qn:: 1.8

## 2019-07-15 LAB — LARGE UNSTAINED CELLS: Lab: 3

## 2019-07-16 NOTE — Unmapped (Signed)
Pt concerned about urine output. Advised patient to call back if UO less than 1L

## 2019-07-17 LAB — HLA DS POST TRANSPLANT

## 2019-07-17 LAB — DSA COMMENT

## 2019-07-18 LAB — BK VIRUS QUANTITATIVE PCR, BLOOD
BK BLOOD RESULT: NOT DETECTED
BK BLOOD RESULT: NOT DETECTED

## 2019-07-18 LAB — CMV DNA, QUANTITATIVE, PCR: CMV QUANT LOG10: 1.97 {Log_IU}/mL — ABNORMAL HIGH (ref <0.00)

## 2019-07-18 LAB — BK BLOOD RESULT
Lab: NOT DETECTED
Lab: NOT DETECTED

## 2019-07-18 LAB — CMV QUANT LOG10: Lab: 1.97 — ABNORMAL HIGH

## 2019-07-22 ENCOUNTER — Ambulatory Visit: Admit: 2019-07-22 | Discharge: 2019-07-23 | Payer: MEDICARE

## 2019-07-22 DIAGNOSIS — Z940 Kidney transplant status: Secondary | ICD-10-CM

## 2019-07-22 DIAGNOSIS — Z0000 Encounter for general adult medical examination without abnormal findings: Secondary | ICD-10-CM

## 2019-07-22 LAB — CBC W/ AUTO DIFF
BASOPHILS ABSOLUTE COUNT: 0 10*9/L (ref 0.0–0.1)
BASOPHILS RELATIVE PERCENT: 0.1 %
EOSINOPHILS RELATIVE PERCENT: 2.5 %
HEMATOCRIT: 31.5 % — ABNORMAL LOW (ref 36.0–46.0)
HEMOGLOBIN: 10.1 g/dL — ABNORMAL LOW (ref 13.5–16.0)
LARGE UNSTAINED CELLS: 2 % (ref 0–4)
LYMPHOCYTES ABSOLUTE COUNT: 0.8 10*9/L — ABNORMAL LOW (ref 1.5–5.0)
LYMPHOCYTES RELATIVE PERCENT: 18.4 %
MEAN CORPUSCULAR HEMOGLOBIN CONC: 32.2 g/dL (ref 31.0–37.0)
MEAN CORPUSCULAR HEMOGLOBIN: 25 pg — ABNORMAL LOW (ref 26.0–34.0)
MEAN CORPUSCULAR VOLUME: 77.7 fL — ABNORMAL LOW (ref 80.0–100.0)
MONOCYTES ABSOLUTE COUNT: 0.4 10*9/L (ref 0.2–0.8)
MONOCYTES RELATIVE PERCENT: 10.3 %
NEUTROPHILS ABSOLUTE COUNT: 2.7 10*9/L (ref 2.0–7.5)
NEUTROPHILS RELATIVE PERCENT: 66.3 %
PLATELET COUNT: 383 10*9/L (ref 150–440)
RED BLOOD CELL COUNT: 4.05 10*12/L (ref 4.00–5.20)
RED CELL DISTRIBUTION WIDTH: 15.1 % — ABNORMAL HIGH (ref 12.0–15.0)
WBC ADJUSTED: 4.1 10*9/L — ABNORMAL LOW (ref 4.5–11.0)

## 2019-07-22 LAB — BASIC METABOLIC PANEL
BLOOD UREA NITROGEN: 22 mg/dL — ABNORMAL HIGH (ref 7–21)
BUN / CREAT RATIO: 13
CALCIUM: 9.7 mg/dL (ref 8.5–10.2)
CHLORIDE: 108 mmol/L — ABNORMAL HIGH (ref 98–107)
CO2: 24 mmol/L (ref 22.0–30.0)
CREATININE: 1.7 mg/dL — ABNORMAL HIGH (ref 0.60–1.00)
EGFR CKD-EPI AA FEMALE: 43 mL/min/{1.73_m2} — ABNORMAL LOW (ref >=60)
POTASSIUM: 4.7 mmol/L (ref 3.5–5.0)
SODIUM: 142 mmol/L (ref 135–145)

## 2019-07-22 LAB — URINALYSIS
BILIRUBIN UA: NEGATIVE
GLUCOSE UA: NEGATIVE
KETONES UA: NEGATIVE
LEUKOCYTE ESTERASE UA: NEGATIVE
NITRITE UA: NEGATIVE
PH UA: 6 (ref 5.0–9.0)
RBC UA: >100 /HPF — ABNORMAL HIGH (ref <4)
SPECIFIC GRAVITY UA: 1.02 (ref 1.005–1.040)
SQUAMOUS EPITHELIAL: 5 /HPF (ref 0–5)
UROBILINOGEN UA: 0.2
WBC UA: <1 /HPF (ref 0–5)

## 2019-07-22 LAB — ANION GAP: Anion gap 3:SCnc:Pt:Ser/Plas:Qn:: 10

## 2019-07-22 LAB — PHOSPHORUS: Phosphate:MCnc:Pt:Ser/Plas:Qn:: 2.9

## 2019-07-22 LAB — PROTEIN / CREATININE RATIO, URINE
CREATININE, URINE: 140.2 mg/dL
PROTEIN/CREAT RATIO, URINE: 0.217

## 2019-07-22 LAB — WBC ADJUSTED: Leukocytes:NCnc:Pt:Bld:Qn:: 4.1 — ABNORMAL LOW

## 2019-07-22 LAB — MAGNESIUM: Magnesium:MCnc:Pt:Ser/Plas:Qn:: 2

## 2019-07-22 LAB — PROTEIN UA

## 2019-07-22 LAB — CREATININE, URINE: Lab: 140.2

## 2019-07-23 LAB — CMV COMMENT: Lab: 0

## 2019-07-23 LAB — CMV DNA, QUANTITATIVE, PCR
CMV QUANT LOG10: 1.88 {Log_IU}/mL — ABNORMAL HIGH (ref <0.00)
CMV QUANT: 76 [IU]/mL — ABNORMAL HIGH (ref <0)

## 2019-07-24 LAB — BK VIRUS QUANTITATIVE PCR, BLOOD: BK BLOOD RESULT: NOT DETECTED

## 2019-07-24 LAB — BK BLOOD LOG(10): Lab: 0

## 2019-07-26 ENCOUNTER — Ambulatory Visit: Admit: 2019-07-26 | Discharge: 2019-07-27 | Payer: MEDICARE

## 2019-07-26 ENCOUNTER — Ambulatory Visit
Admit: 2019-07-26 | Discharge: 2019-07-27 | Payer: MEDICARE | Attending: Infectious Disease | Primary: Infectious Disease

## 2019-07-26 DIAGNOSIS — Z23 Encounter for immunization: Secondary | ICD-10-CM

## 2019-07-26 DIAGNOSIS — Z4822 Encounter for aftercare following kidney transplant: Secondary | ICD-10-CM

## 2019-07-26 DIAGNOSIS — Z940 Kidney transplant status: Secondary | ICD-10-CM

## 2019-07-26 DIAGNOSIS — B9689 Other specified bacterial agents as the cause of diseases classified elsewhere: Secondary | ICD-10-CM

## 2019-07-26 DIAGNOSIS — N390 Urinary tract infection, site not specified: Secondary | ICD-10-CM

## 2019-07-26 MED ORDER — PREDNISONE 2.5 MG TABLET
ORAL_TABLET | Freq: Every day | ORAL | 11 refills | 30 days | Status: CP
Start: 2019-07-26 — End: 2020-07-25
  Filled 2019-08-01: qty 90, 30d supply, fill #0

## 2019-07-26 NOTE — Unmapped (Signed)
IMMUNOCOMPROMISED HOST INFECTIOUS DISEASE CONSULT NOTE      Kimberly [name] is being seen in consultation at the request of Dr. Nestor Lewandowsky for evaluation of recurrent UTI.    Assessment/Recommendations:    Kimberly [name] is a 38 y.o. 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  - <200 no treatment    # Recurrent ESBL-Kleb pneumo UTI, 05/17/2019, 06/06/2019, 06/28/2019  - 8/28 left allograft [place] mild pelviectasis, bladder post-void volume 25ml  - pyuria and mild sx at each episode (bladder pressure, urgency, malaise, no dysuria/allograft tenderness)  - S- erta, FQ; R-amp, cefazolin, tmp/smx, tobra; I ceftriaxone, macrobid, amp/sulbactam  - review of cultures shows that she seems to be able to clear between courses of cipro (MGUF or <10K cfu)  - 9/9 to 07/24/19 - 21 days of cipro with resolution of symptoms    # Antimicrobial allergies:  - cefazolin - SOB, itching    Recommendations:  - Complete 21 days course of ciprofloxacin as previously prescribed. She can do a test of cure urine culture if she wants, but she seems to have been able to have cleared her urine culture in August on review of prior cultures.     - If she relapses with the same organism after 21 days of therapy, consider referral to [facility] in urology look at the mild pelviectasis on ultrasound to comment if there is any role for cystoscopy/stent.     -We discussed the risks and benefits of increased fluid intake, timed toileting, double voiding, D-mannose, and cranberry extracts to reduce the risk of recurrent UTI. She still has trouble with increased fluid intake due to swelling but will attempt this. I did not get the sense that she thought the problem was severe or chronic enough to try a supplement. [place], these supplements may be less useful in the setting of Klebsiella infection (as compared to recurrent E coli infection). Post-coital nitrofurantion prophylaxis was discussed but she does not link her UTIs to intercourse.     - She does not have good antimicrobial suppression options if she continues to relapse with this organism. Nitrofurantion 100mg  bid might work for suppression even if intermediate in culture, but I do not think that we are there yet. Fosfomycin could be used twice weekly for suppression, but I would save this as a treatment option for her.     - No treatment for low level CMV viremia <200. She recently had her prednisone reduced on 10/2; hopefully this will help. I would stop checking VLs since she has been clinically stable for the pass 3 weeks.     -  Flu shot today. Check records for PPSV23 (pneumovax) administration. We discussed the potential costs and benefits of Tdap and the HPV series.     Follow up PRN    Recommendations communicated via shared medical record.    I personally spent 40 minutes face-to-face with the patient and greater than 50% of that time was spent in counseling or coordinating care with the patient regarding recurrent UTIs. [name], MD      History of Present Illness:      Source of information includes:  Electronic Medical Records and Discussion with patient     No trouble with recurrent UTIs until end of July. Then 3 since then. Cloudy urine and smell. Makes 1-2L per day. Nothing has changed that she appreciates. Maybe some bladder pressure. No burning with urination and incontinence. Chronic urinary tract infections prior to age 56. Able to have urgency. Unable to feel current allograft on left. No fever. +Malaise. She states that she is sexually active but does not believe that UTIs are related to intercourse. She denies vaginal dryness. She tries to drink and urinate frequently. She cannot drink too much because she swells some time. She thinks that she empties her bladder completely. Her symptoms respond well to ciprofloxacin. She does not have diarrhea, thrush, or tendon pain with cipro. She has some lost of vision in both eyes.     Allergies:  Allergies   Allergen Reactions   ??? Ancef [Cefazolin] Shortness Of Breath   ??? Adhesive Tape-Silicones      Other reaction(s): Other (See Comments)  Uncoded Allergy. Allergen: plastic tape, Other Reaction: blistering   ??? Demerol [Meperidine] Nausea And Vomiting       Medications:     Current Outpatient Medications:   ???  acetaminophen (TYLENOL) 500 MG tablet, Take 2 tablets (1,000 mg total) by mouth every six (6) hours as needed for pain., Disp: 100 tablet, Rfl: 0  ???  aspirin (ECOTRIN) 81 MG tablet, Take 1 tablet (81 mg total) by mouth daily., Disp: 30 tablet, Rfl: 11  ???  calcitrioL (ROCALTROL) 0.25 MCG capsule, Take 1 capsule (0.25 mcg total) by mouth daily., Disp: 30 capsule, Rfl: 11  ???  carboxymethylcellulose sodium (REFRESH CELLUVISC) 1 % DpGe, Instill drops in affected eye(s) as directed as needed., Disp: , Rfl:   ???  ferrous sulfate 325 (65 FE) MG tablet, Take 325 mg by mouth daily., Disp: , Rfl:   ???  MYFORTIC 180 mg EC tablet, Take 4 tablets (720 mg total) by mouth Two (2) times a day., Disp: 240 tablet, Rfl: 11  ???  omeprazole (PRILOSEC) 40 MG capsule, Take 1 capsule (40 mg total) by mouth daily., Disp: 30 capsule, Rfl: 11  ??? predniSONE (DELTASONE) 2.5 MG tablet, Take 3 tablets (7.5 mg total) by mouth daily., Disp: 90 tablet, Rfl: 11  ???  sodium bicarbonate 650 mg tablet, Take 1 tablet (650 mg total) by mouth Two (2) times a day., Disp: 180 tablet, Rfl: 3  No current facility-administered medications for this visit.     Facility-Administered Medications Ordered in Other Visits:   ???  belatacept (NULOJIX) 375 mg in sodium chloride (NS) 0.9 % 100 mL IVPB, 5 mg/kg, Intravenous, Once, Wallace Cullens Mincemoyer, CPP    Current/Prior immunomodulators:  MMF 720 BID  Bela  pred 5mg     Other medications reviewed.     Medical History:  Past Medical History:   Diagnosis Date   ??? Bell's palsy    ??? Disease of  thyroid gland    ??? ESRD (end stage renal disease) (CMS-HCC)    ??? Nonarteritic ischemic optic neuropathy    ??? Reflux nephropathy        Surgical History:  Past Surgical History:   Procedure Laterality Date   ??? AV FISTULA PLACEMENT Left     Left arm   ??? NEPHRECTOMY Bilateral    ??? PARATHYROIDECTOMY Bilateral 2010   ??? PR TRANSPLANT,PREP CADAVER RENAL GRAFT Left 12/05/2018    Procedure: [facility] STD PREP CAD DONR RENAL ALLOGFT PRIOR TO TRNSPLNT, INCL DISSEC/REM PERINEPH FAT, DIAPH/RTPER ATTAC;  Surgeon: Bufford Lope, MD;  Location: MAIN OR [place];  Service: Transplant   ??? PR TRANSPLANTATION OF KIDNEY Left 12/05/2018    Procedure: RENAL ALLOTRANSPLANTATION, IMPLANTATION OF GRAFT; WITHOUT RECIPIENT NEPHRECTOMY;  Surgeon: Bufford Lope, MD;  Location: MAIN OR [facility];  Service: Transplant   ??? SKIN BIOPSY     ??? SKIN BIOPSY Left     left hand   C-section by OSH notes.     Social History:  She is married and has a young teen daughter.     Family History:  Family History   Problem Relation Age of Onset   ??? Diabetes Father    ??? Kidney disease Father    ??? Cancer Maternal Grandmother    ??? Cancer Maternal Grandfather    ??? Cancer Paternal Grandmother    ??? Cancer Paternal Grandfather    ??? Kidney disease Brother        Review of Systems:  All other systems reviewed are negative.     Objective     Vital Signs:  BP 121/75  - Pulse 94  - Temp 36.8 ??C (98.2 ??F) (Tympanic)  - Ht 162.6 cm (5' 4)  - Wt 76.5 kg (168 lb 9.6 oz)  - BMI 28.94 kg/m??     Physical Exam:  Prominent fistula at left arm  GEN:  looks well, no apparent distress  EYES: sclerae anicteric and non injected and +glasses  ENT:no thrush, leukoplakia or oral lesions  CV:no peripheral edema  PULM:normal work of breathing at rest  ZO:XWRU, NT and no tenderness over renal allograft  SKIN:no petechiae, ecchymoses or obvious rashes on clothed exam  EAV:WUJWJXBJYN steady without support  NEURO:no tremor noted and moves extremities equally  PSYCH:attentive, appropriate affect, good eye contact, fluent speech    Labs:  Lab Results   Component Value Date    WBC 4.1 (L) 07/22/2019    WBC 4.1 (L) 07/15/2019    WBC 9.2 01/16/2015    WBC 7.4 09/13/2013    HGB 10.1 (L) 07/22/2019    HGB 12.2 01/16/2015    Platelet 383 07/22/2019    Platelet 363 07/15/2019    Platelet 497 (H) 01/16/2015    Platelet 440 09/13/2013    Absolute Neutrophils 2.7 07/22/2019    Absolute Neutrophils 2.7 07/15/2019    Absolute Neutrophils 6.9 01/16/2015    Absolute Neutrophils 5.6 09/13/2013    Absolute Lymphocytes 0.8 (L) 07/22/2019    Absolute Lymphocytes 1.5 01/16/2015    Absolute Eosinophils 0.1 07/22/2019    Absolute Eosinophils 0.2 01/16/2015    Sodium 142 07/22/2019    Sodium 137 09/13/2013    Potassium 4.7 07/22/2019    Potassium 5.9 (H) 09/13/2013    BUN 22 (H) 07/22/2019    BUN 34 (H) 01/16/2015    Creatinine 1.70 (H) 07/22/2019    Creatinine 8.84 (H) 01/16/2015    Glucose 95 07/22/2019    Magnesium 2.0 07/22/2019  Magnesium 2.2 09/13/2013    Albumin 3.6 05/06/2019    Albumin 4.9 01/16/2015    Total Bilirubin 0.5 05/06/2019    Total Bilirubin 0.7 01/16/2015    AST 20 05/06/2019    AST 52 (H) 01/16/2015    ALT 28 05/06/2019    ALT 100 (H) 01/16/2015    Alkaline Phosphatase 72 05/06/2019    Alkaline Phosphatase 115 01/16/2015    INR 1.15 02/19/2019 INR 1.0 01/16/2015       Microbiology:  Past cultures were reviewed in Epic and CareEverywhere.  Pertinent cultures listed above.    Lab Results   Component Value Date    CMV Viral Ld Not Detected 12/17/2018    CMV Quant 76 (H) 07/22/2019    CMV Quant 93 (H) 07/15/2019    CMV Quant 62 (H) 07/08/2019         Imaging:  06/19/2019 renal US  TRANSPLANTED KIDNEY: The renal transplant was located in the left lower quadrant. Normal size and echogenicity.  No solid masses or calculi. No perinephric collections identified. Mild pelviectasis which does not significantly change on the postvoid images, without gross hydronephrosis.  VESSELS:   - Perfusion: Using power Doppler, patchy perfusion was seen throughout the renal parenchyma, similar to prior.  - Slightly increased resistive indices in the renal transplant arcuate and segmental arteries, still within normal limits. Overall unchanged main renal artery resistive indices, within normal limits.  - Main renal artery/iliac artery: Patent  - Main renal vein/iliac vein: Patent  BLADDER: Small postvoid bladder volume of 25 cc.  ??IMPRESSION:  --Patent left lower quadrant renal transplant vasculature with patchy perfusion, similar to prior.  --Slightly increased resistive indices in the renal transplant arcuate and segmental arteries, still within normal limits. Otherwise stable main renal artery resistive indices, within normal limits.    Serologies:  Lab Results   Component Value Date    CMV IgG POSITIVE (A) 01/16/2015    CMV IGG Positive (A) 12/05/2018    EBV IgG POSITIVE 01/16/2015    EBV VCA IgG Antibody Positive (A) 12/05/2018    Hepatitis A IgG Reactive (A) 01/16/2015    Hep A IgG Reactive (A) 04/30/2018    Hep B Surface Ag Nonreactive 12/28/2018    Hepatitis B Surface Ag Negative 01/16/2015    Hep B S Ab Reactive (A) 12/05/2018    Hep B S Ab Reactive 01/16/2015    Hep B Surf Ab Quant 63.82 (H) 12/05/2018    Hep B Core Total Ab Nonreactive 12/05/2018    Hep B Core Total Ab Negative 01/16/2015    Hepatitis C Ab Nonreactive 12/05/2018    Hepatitis C Ab Negative 01/16/2015    HSV 1 IgG Negative 12/05/2018    HSV 1 IgG NEGATIVE 01/16/2015    HSV 2 IgG Negative 12/05/2018    HSV 2 IgG NEGATIVE 01/16/2015    Varicella IgG Positive 12/05/2018    Varicella IgG POSITIVE 01/16/2015    Rubella IgG Scr Positive 04/30/2018    Rubella IgG Scr POSITIVE 01/16/2015    Toxoplasma Gondii IgG Negative 04/30/2018    Toxoplasma Gondii IgG NEGATIVE 01/16/2015    Quantiferon Mitogen Minus Nil >10.00 06/12/2017    Quantiferon Antigen 1 minus Nil -0.04 06/12/2017       Immunizations:  Immunization History   Administered Date(s) Administered   ??? Hepatitis A 06/06/2013, 11/27/2013   ??? Influenza Vaccine Quad (IIV4 PF) 76mo+ injectable 07/26/2019   ??? Influenza Virus Vaccine, unspecified formulation 08/09/2018   ??? Pneumococcal  Conjugate 13-Valent 06/11/2018

## 2019-07-26 NOTE — Unmapped (Signed)
6045 Patient arrived Transplant Infusion Room today for belatacept, Condition: well; Mobility: ambulating; accompanied by self.   See Flowsheet and MAR for all details of visit.   0926 VS stable.  4098  PIV placed, NS KVO; labs no orders found, urine no orders found.  1117 Infusion initiated.  1147 Infusion complete.  Flu shot administered this visit in right deltoid.  1152 VS stable, PIV removed, pt left clinic, Condition: well; Mobility: ambulating; accompanied by self.

## 2019-07-26 NOTE — Unmapped (Signed)
I recommend the products below for UTI prevention. If you purchase supplements, always purchase them from a reputable company. Some studies that show benefit to cranberry juice or supplements, but a big combination analysis of the best 24 high-qualities available showed no benefit so most doctors tend not to recommend these any longer. Again, this does not mean that some people do not benefit from cranberry juice or supplements. I prefer cranberry supplements to juice to limit sugar and calories. Also, you would need to drink a lot of cranberry juice to get the theoretical benefit. You may need to try more than one brand if you find that some brands have additives that give you side effects. I think it is reasonable to try a supplement for ~6 months to see if these reduce UTIs (not necessarily eliminate all UTIs) before deciding whether they work for you or not.     1. D-mannose, usually 1g twice per day for prevention     2. Ellura - medical grade cranberry supplement - supposedly has the optimal dose of proanthocyanidins, the active ingredient in cranberries     Some products available on [employer] that may be ok.   1. NOW D-Mannose 500 mg,120 Veg Capsules   2. UTI Slip D-Mannose Non GMO Organic Source Veggie Capsules.  3. [employer]NC Herbal Plus Cranberry D-Mannose, 60 capsules   4. Ellura 36 mg PAC (30 Capsule Box), Natural, medical-grade cranberry Supplement for Prevention of Recurrent Urinary Tract Infections (UTI). By Mancel Parsons, maker of ellura

## 2019-07-26 NOTE — Unmapped (Signed)
[facility] Refill Coordination Note    Specialty Medication(s) to be Shipped:   Transplant: Myfortic 180mg     Other medication(s) to be shipped: Calcitriol 0.25mg  and Omeprazole 40mg      Kimberly [name], DOB: 01-02-1981  Phone: [phone] (home)       All above HIPAA information was verified with patient.     Completed refill call assessment today to schedule patient's medication shipment from the [facility] [phone]).       Specialty medication(s) and dose(s) confirmed: Patient reports changes to the regimen as follows: Prednisone is now 5mg  daily and she was reaching out to her coordinator today to send over a new rx   Changes to medications: [name] reports no changes at this time.  Changes to insurance: No  Questions for the pharmacist: No    Confirmed patient received Welcome Packet with first shipment. The patient will receive a drug information handout for each medication shipped and additional FDA Medication Guides as required.       DISEASE/MEDICATION-SPECIFIC INFORMATION        N/A    SPECIALTY MEDICATION ADHERENCE     Medication Adherence    Patient reported X missed doses in the last month: 0  Specialty Medication: myfortic 180mg   Patient is on additional specialty medications: No          Myfortic 180 mg: 12 days of medicine on hand     SHIPPING     Shipping address confirmed in Epic.     Delivery Scheduled: Yes, Expected medication delivery date: 08/02/2019.     Medication will be delivered via UPS to the home address in Epic [place].    [name]   [facility] Specialty Technician

## 2019-07-29 ENCOUNTER — Ambulatory Visit: Admit: 2019-07-29 | Discharge: 2019-08-27 | Payer: MEDICARE

## 2019-07-29 DIAGNOSIS — M2560 Stiffness of unspecified joint, not elsewhere classified: Secondary | ICD-10-CM

## 2019-07-29 DIAGNOSIS — I890 Lymphedema, not elsewhere classified: Secondary | ICD-10-CM

## 2019-07-29 NOTE — Unmapped (Signed)
[facility] [address] [place]  OUTPATIENT PHYSICAL THERAPY  07/29/2019          Patient Name: Kimberly [name]  Date of Birth:[birth year]  Diagnosis:   Encounter Diagnosis   Name Primary?   ??? Lymphedema      Referring MD:  Leeroy Bock, MD     Plan of Care Effective Date:       Assessment & Plan     Assessment details: 38 y.o. 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 Currently the left UE is 78.04 % bigger than the right UE, although this is in large part due to the placement of the fistula in her L arm.  She demonstrates decreased L shoulder ROM which she states happened when she got a frozen shoulder.  Despite PT, she was not able to regain full L sh ROM after that.  Pt reports L arm swelling on/off since the kidney transplant this past February.  She has had 3 prior kidney transplants.  She reports that her LUE swelling depends on her weight.  When she's 76 kg, it's really swollen.  74.5 kg it's a lot better. 72 kg dry weight there is no swelling.  Last dialysis treatment was mid-June, 2020.  Creatinine is down and urine output is better since then.  38 yr old fistula.   Pt reports she does have some abdominal swelling.  Will need to contact Nephrology/Vascular regarding whether MDs think pt's kidney will be able to handle complete decongestive therapy (CDT) for her LUE.  During CDT, 2x the normal amount of fluid returns to the heart/kidney all at one time, and the pt's kidney must be able to tol the increase of fluid all at once.  In addition, normally compression garment/bandages would be recommended, as part of CDT for LUE, but due to presence of fistula in LUE, this may not be possible.  Will need MD approval of this treatment.     Pt will benefit from physical therapy by a certified lymphedema therapist to address : education in self care, precautions, and management of symptoms, optimal edema reduction/maintenance of girth, soft tissue changes, fitting of appropriate compression garments, and progression of exercises to optimize functional ROM and strength in all ADL's(home, work, recreational, community)      This treatment is medically necessary throughout the course of this patient's life during and after cancer treatments to improve functional activities and /or to minimize the decline  of functional abilities and worsening of symptoms,including infection and recurrent hospitalizations, through independent/home management strategies.    Recommendations for treatment/garments:  1) Patient will obtain proper garments to address swelling and improve tissue integrity. Garment recommendations for affected upper extremity:  Compression bandages for intense phase of CDT, compression sleeve/glove for LUE 20-30 mmHg for maintenance phase of CDT  2) Pt will be seen clinically for rehab to include treatments to address as needed: lymphedema, pain, ROM, strength, balance and endurance deficits,  as well as learning  self care strategies to address these deficits      Patient requires skilled Physical Therapy services  for the following problem list and secondary functional limitations:    Problem List:   Lack of knowledge of lymphedema/edema self care, Uncontrolled swelling and/or lack of appropriate compression garment, Decreased ROM of affected limb(s) and Decreased sensation    Secondary Functional Limitations:  Decreased knowledge  of self-care of lymphedema/edema puts patient at risk for increased swelling and infection, Uncontrolled swelling can increase chances of infection, and limit functional ROM, Decreased ROM can limit the patient's ADL's, work, and recreational activities and Decreased sensation in the LE's can cause gait and balance disturbances and put the patient at risk for falls            Impairments: decreased skin integrity, pain, postural weakness, decreased knowledge of self-care, decreased range of motion, decreased strength, increased edema, joint restriction and orthopedic restrictions    Other impairment: decreased vision (R eye blind, 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   Barriers to therapy: none        Therapy Goals  Goals: In 1-3 months: Pt and/or caregiver will:    Have knowledge of proper skin care, lymphedema risk reduction strategies, self MLD, HEP, and compression garment/devices to utilize.    Improve skin condition as evidenced by increased hydration, palpable decrease in fibrosis, decreased pitting, and increased scar mobility to improve ROM for functional limb use and the ability for the quadrant to decongest.    Decrease limb volume by 10-20% or as needed for proper fit of garments and clothing, and to reduce infection risk.    Improve shoulder ROM to optimal functional range in ADL's (home, work , recreational and community)       In 6-12 months: Pt and/or caregiver will:    Be independent with home management of self-care related to lymphedema to reduce infection risk and recurrent hospitalizations.    Retain an optimal limb volume that allows patient to fit into appropriate compression garments and clothing to improve functional use of UE and QOL.    Stabilize/reduce deterioration of shoulder ROM in optimal functional range in all ADL's by providing pt education/assistive devices to ensure safety and effectiveness in shoulder mobility and function on an ongoing basis        Plan  Therapy options: will be seen for skilled physical therapy services    Planned therapy interventions: compression bandaging, Compression pump, education - family/caregiver, education - patient, garment measurement, home exercise program, manual lymph drainage, manual therapy, self-care / home training, taping, therapeutic activities and therapeutic exercises    Other planned therapy interventions: LLLT and lymphatouch as needed to address fibrosis, scarring and pain    PT/DME Equipment: compression garments.      Education provided to: patient.  Education provided: anatomy, community resources, HEP, lymphedema precautions, symptom management, treatment options and plan, garment options, DME education and equipment recommendations  Education Results: needs reinforcement, verbalized good understanding and demonstrates understanding.    Next visit plan: Continue patient teaching on self care to include prn:  lymphedema self care, HEP.  See pt clinically for MLD and garments/bandages to reduce swelling and improve skin condition, manual therapy to address ROM deficits, soft tissue restrictions, Therapeutic exercise prn, LLLT and lymphatouch prn  Total Session Time: 55  Treatment rendered today: Mod complex PT eval 30 min    Throughout the session pt wore mask and therapist wore appropriate PPE.      Self-Care 25 min  Pt and/or caregiver  were educated regarding some or all of the following prn:  Lymphedema/edema etiology, Lymphedema/edema precautions, Indications/Contraindications to treatment, importance of therapy, treatment options and plan, self-care management  to include skin care, self MLD, garment/equipment options and HEP, and community resources.  Given written information as needed.  - pt instructed in lymph drainage exercises for LUE and skin care              Plan details: Within a 90 day certification period, schedule permitting, decreasing frequency as able, patient will participate in the following as needed: Manual lymphatic drainage, compression bandaging or use of reduction kits, appropriate garment recommendations, skin care and therapeutic exercise, manual therapy, low level laser therapy, lymphatouch, ROM and strength training,balance retraining,  education on posture and body mechanics, taping, and pump/equipment  recommendations.      Attendance Policy:  I reviewed the no-show/attendance policy with the patient and caregiver(s). The family is aware that they must call to cancel appointments more than 24 hours in advance. They are also aware that if they late cancel or no-show three times, we reserve the right to cancel their remaining appointments. This policy is in place to allow [place] to best serve the needs of our caseload.                Subjective     History of Present Illness  Date of Onset: 12/05/2018  Date of Surgery: 12/05/2018 (kidney transplant)  Date of Evaluation: 07/29/2019  Surgical Procedure: Other (breast cancer surgery)  Reason for Referral/Chief Complaint: LUE swelling, s/p kidney transplant with L arm fistula and multiple skin infections in LUE.      Subjective: History of Present Illness/ Pt reports:  See assessment    History of  skin/cellulitis infections?: yes    Previous Lymphedema Treatment Type:  none    Current management:   Elevation of arm      Quality of life: good          Pain  No pain reported                                              Prior Functional Status: No physical limitations.    Prior Functional Status: Physical Limitation(s)-Decreased ability to carry things, fit into clothing     Current functional status: not age appropriate          Social Support  Communication Preference: verbal, written and visual   Barriers to Learning: No Barriers  Work/School: Social History    Tobacco Use      Smoking status: Never Smoker      Smokeless tobacco: Never Used    Alcohol use: No      Diagnostic Tests                              Diagnostic Test Comments:     Past Medical History:  No date: Bell's palsy  No date: Disease of thyroid gland  No date: ESRD (end stage renal disease) (CMS-HCC)  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  No date: Reflux nephropathy   Past Surgical History:  No date: AV FISTULA PLACEMENT; Left      Comment:  Left arm  No date: NEPHRECTOMY; Bilateral  2010: PARATHYROIDECTOMY; Bilateral  12/05/2018: PR TRANSPLANT,PREP CADAVER RENAL GRAFT; Left  Comment:  Procedure: BACKBNCH STD PREP CAD DONR RENAL ALLOGFT                PRIOR TO TRNSPLNT, INCL DISSEC/REM PERINEPH FAT,                DIAPH/RTPER ATTAC;  Surgeon: Bufford Lope, MD;  Location:               MAIN OR [place];  Service: Transplant  12/05/2018: PR TRANSPLANTATION OF KIDNEY; Left      Comment:  Procedure: RENAL ALLOTRANSPLANTATION, IMPLANTATION OF                GRAFT; WITHOUT RECIPIENT NEPHRECTOMY;  Surgeon: Bufford Lope, MD;  Location: MAIN OR [place];  Service: Transplant  No date: SKIN BIOPSY  No date: SKIN BIOPSY; Left      Comment:  left hand   Oncology History  No history exists.      Treatments  No previous or current treatments                          Patient Goals  Patient goals for therapy: decreased edema, improve fit of clothing, improve overhead reaching and obtain compression garment      Patient goal: learn self care           Objective     Static Posture     Static Posture Comments  Contraindications:  Recent kidney transplant  LUE fistula  Will need to contact MDs regarding status of these contraindications to MLD and compression    Precautions:    Recent kidney transplant  LUE fistula    Red Flags:    none    Location of swelling:  left hand , wrist, forearm, elbow, arm and shoulder    Skin: WFL except for the following:  Significant scarring from varicosities, moist and varicosities    Stemmer's sign:yes.    Sensation Intact to light touch:   Decreased sensation at fingertips    Peripheral neuropathy?yes Location: fingertips    Incision: healing appropriately for post op status, adhered to underlying tissue    Posture/Observations:  WFL       Range of Motion/Flexibilty:   UE WFL except L sh ER = 57 deg, L sh abd = 138 deg    Strength/MMT:   UE WFL       Balance: Taken only as needed  TUG score:  Seconds:  N/A  (greater than 12 seconds = increased risk for falls)    Endurance/Fatigue: Taken only as needed  Modified fatigue scale score: N/A  (0-84, higher score indicates more fatigue)    Girth Measurements:     Hand and fingers:    Right MCP:  19.4 cm  Right mid palm:  21.4 cm    Left MCP:  19.5 cm  Left mid palm:  22.5 cm          I attest that I have reviewed the above information.  Signed: Romero Liner, PT  07/29/2019 11:33 AM

## 2019-08-01 MED FILL — OMEPRAZOLE 40 MG CAPSULE,DELAYED RELEASE: ORAL | 30 days supply | Qty: 30 | Fill #7

## 2019-08-01 MED FILL — OMEPRAZOLE 40 MG CAPSULE,DELAYED RELEASE: 30 days supply | Qty: 30 | Fill #7 | Status: AC

## 2019-08-01 MED FILL — CALCITRIOL 0.25 MCG CAPSULE: 30 days supply | Qty: 30 | Fill #7 | Status: AC

## 2019-08-01 MED FILL — CALCITRIOL 0.25 MCG CAPSULE: ORAL | 30 days supply | Qty: 30 | Fill #7

## 2019-08-01 MED FILL — MYFORTIC 180 MG TABLET,DELAYED RELEASE: ORAL | 30 days supply | Qty: 240 | Fill #2

## 2019-08-01 MED FILL — PREDNISONE 2.5 MG TABLET: 30 days supply | Qty: 90 | Fill #0 | Status: AC

## 2019-08-01 MED FILL — MYFORTIC 180 MG TABLET,DELAYED RELEASE: 30 days supply | Qty: 240 | Fill #2 | Status: AC

## 2019-08-05 ENCOUNTER — Ambulatory Visit: Admit: 2019-08-05 | Discharge: 2019-08-06 | Payer: MEDICARE

## 2019-08-05 ENCOUNTER — Other Ambulatory Visit: Payer: Self-pay

## 2019-08-05 ENCOUNTER — Ambulatory Visit
Admission: EM | Admit: 2019-08-05 | Discharge: 2019-08-05 | Disposition: A | Discharge status: Home / Self Care | Payer: Medicare Other | Attending: Family Medicine | Admitting: Family Medicine

## 2019-08-05 ENCOUNTER — Encounter: Payer: Self-pay | Admitting: Emergency Medicine

## 2019-08-05 DIAGNOSIS — B029 Zoster without complications: Secondary | ICD-10-CM | POA: Diagnosis not present

## 2019-08-05 LAB — MEAN CORPUSCULAR VOLUME: Lab: 77.2 — ABNORMAL LOW

## 2019-08-05 LAB — CBC W/ AUTO DIFF
BASOPHILS ABSOLUTE COUNT: 0 10*9/L (ref 0.0–0.1)
BASOPHILS RELATIVE PERCENT: 0.3 %
EOSINOPHILS ABSOLUTE COUNT: 0.1 10*9/L (ref 0.0–0.4)
EOSINOPHILS RELATIVE PERCENT: 1.8 %
HEMATOCRIT: 31.1 % — ABNORMAL LOW (ref 36.0–46.0)
HEMOGLOBIN: 10.2 g/dL — ABNORMAL LOW (ref 13.5–16.0)
LARGE UNSTAINED CELLS: 2 % (ref 0–4)
LYMPHOCYTES ABSOLUTE COUNT: 0.8 10*9/L — ABNORMAL LOW (ref 1.5–5.0)
LYMPHOCYTES RELATIVE PERCENT: 18.6 %
MEAN CORPUSCULAR HEMOGLOBIN CONC: 32.7 g/dL (ref 31.0–37.0)
MEAN CORPUSCULAR HEMOGLOBIN: 25.3 pg — ABNORMAL LOW (ref 26.0–34.0)
MEAN CORPUSCULAR VOLUME: 77.2 fL — ABNORMAL LOW (ref 80.0–100.0)
MEAN PLATELET VOLUME: 6.6 fL — ABNORMAL LOW (ref 7.0–10.0)
MONOCYTES ABSOLUTE COUNT: 0.3 10*9/L (ref 0.2–0.8)
MONOCYTES RELATIVE PERCENT: 8.4 %
NEUTROPHILS ABSOLUTE COUNT: 2.8 10*9/L (ref 2.0–7.5)
NEUTROPHILS RELATIVE PERCENT: 68.6 %
PLATELET COUNT: 382 10*9/L (ref 150–440)
RED BLOOD CELL COUNT: 4.02 10*12/L (ref 4.00–5.20)
WBC ADJUSTED: 4.1 10*9/L — ABNORMAL LOW (ref 4.5–11.0)

## 2019-08-05 LAB — MAGNESIUM: Magnesium:MCnc:Pt:Ser/Plas:Qn:: 1.7

## 2019-08-05 LAB — BASIC METABOLIC PANEL
ANION GAP: 9 mmol/L (ref 7–15)
BLOOD UREA NITROGEN: 24 mg/dL — ABNORMAL HIGH (ref 7–21)
BUN / CREAT RATIO: 14
CALCIUM: 10 mg/dL (ref 8.5–10.2)
CHLORIDE: 105 mmol/L (ref 98–107)
CO2: 23 mmol/L (ref 22.0–30.0)
CREATININE: 1.72 mg/dL — ABNORMAL HIGH (ref 0.60–1.00)
EGFR CKD-EPI AA FEMALE: 43 mL/min/{1.73_m2} — ABNORMAL LOW (ref >=60)
EGFR CKD-EPI NON-AA FEMALE: 37 mL/min/{1.73_m2} — ABNORMAL LOW (ref >=60)
POTASSIUM: 4.4 mmol/L (ref 3.5–5.0)
SODIUM: 137 mmol/L (ref 135–145)

## 2019-08-05 LAB — PHOSPHORUS: Phosphate:MCnc:Pt:Ser/Plas:Qn:: 2.7 — ABNORMAL LOW

## 2019-08-05 LAB — GLUCOSE RANDOM: Glucose:MCnc:Pt:Ser/Plas:Qn:: 83

## 2019-08-05 MED ORDER — VALACYCLOVIR HCL 1 G PO TABS: 1000.0000 mg | ORAL_TABLET | Freq: Three times a day (TID) | ORAL | 0 refills | Status: AC

## 2019-08-05 MED ORDER — HYDROCODONE-ACETAMINOPHEN 5-325 MG PO TABS: ORAL_TABLET | ORAL | 0 refills | Status: AC

## 2019-08-05 NOTE — ED Provider Notes (Signed)
[facility]    CSN: [id number] Arrival date & time: 08/05/19  1451      History   Chief Complaint Chief Complaint  Patient presents with  . Herpes Zoster    HPI Julie Camacho is a 38 y.o. female.   38 yo female with a c/o a painful, burning rash to right buttock and radiating down into her upper leg for the past 4-5 days. Denies any fevers, chills, genital rash, numbness/tingling, bowel or bladder problems.       Past Medical History:  Diagnosis Date  . Blind left eye   . Renal disorder   . Thyroid disease     There are no active problems to display for this patient.   Past Surgical History:  Procedure Laterality Date  . AV FISTULA PLACEMENT    . CESAREAN SECTION    . KIDNEY TRANSPLANT    . NEPHRECTOMY TRANSPLANTED ORGAN      OB History   No obstetric history on file.      Home Medications    Prior to Admission medications   Medication Sig Start Date End Date Taking? Authorizing Provider  acetaminophen (TYLENOL) 500 MG tablet Take by mouth. 12/10/18  Yes [provider]  calcitRIOL (ROCALTROL) 0.5 MCG capsule Take 0.5 mcg by mouth every other day.   Yes [provider]  Carboxymethylcellulose Sod PF (THERATEARS) 1 % GEL Instill drops in affected eye(s) as directed as needed.   Yes [provider]  omeprazole (PRILOSEC OTC) 20 MG tablet Take 20 mg by mouth daily.   Yes [provider]  predniSONE (DELTASONE) 5 MG tablet  04/17/19  Yes [provider]  sodium bicarbonate 650 MG tablet TAKE 1 TABLET (650 MG TOTAL) BY MOUTH TWO (2) TIMES A DAY. 05/20/19  Yes [provider]  ASPIRIN LOW DOSE 81 MG EC tablet  03/21/19   [provider]  cinacalcet (SENSIPAR) 60 MG tablet Take 60 mg by mouth daily.    [provider]  doxycycline (VIBRAMYCIN) 100 MG capsule Take 1 capsule (100 mg total) by mouth 2 (two) times daily. 10/12/18   Coral Spikes, DO  epoetin alfa (EPOGEN,PROCRIT)  10000 UNIT/ML injection Inject 20,000 Units into the skin 3 (three) times a week.    [provider]  heparin 6,000 Units in sodium chloride 0.9 % 500 mL Irrigate with 1 application as directed once.    [provider]  HYDROcodone-acetaminophen (NORCO/VICODIN) 5-325 MG tablet 1-2 tabs po qd prn 08/05/19   Norval Gable, MD  midodrine (PROAMATINE) 5 MG tablet Take 5 mg by mouth 3 (three) times daily with meals.    [provider]  sevelamer carbonate (RENVELA) 800 MG tablet Take 1,600 mg by mouth 3 (three) times daily with meals.    [provider]  Thiamine HCl ([employer] VITAMIN B-1 PO) Take by mouth.    [provider]  valACYclovir (VALTREX) 1000 MG tablet Take 1 tablet (1,000 mg total) by mouth 3 (three) times daily. 08/05/19   Norval Gable, MD    Family History History reviewed. No pertinent family history.  Social History Social History   Tobacco Use  . Smoking status: Never Smoker  . Smokeless tobacco: Never Used  Substance Use Topics  . Alcohol use: No    Frequency: Never  . Drug use: No     Allergies   Cefazolin, Demerol [meperidine], Nsaids, Tolmetin, Cephalosporins, and Tape   Review of Systems Review of Systems   Physical  Exam Triage Vital Signs ED Triage Vitals  Enc Vitals Group     BP 08/05/19 1534 132/81     Pulse Rate 08/05/19 1534 84     Resp 08/05/19 1534 18     Temp 08/05/19 1534 98 F (36.7 C)     Temp src --      SpO2 08/05/19 1534 100 %     Weight 08/05/19 1529 167 lb (75.8 kg)     Height --      Head Circumference --      Peak Flow --      Pain Score 08/05/19 1528 5     Pain Loc --      Pain Edu? --      Excl. in GC? --    No data found.  Updated Vital Signs BP 132/81   Pulse 84   Temp 98 F (36.7 C)   Resp 18   Wt 75.8 kg   LMP 07/29/2019   SpO2 100%   BMI 28.67 kg/m   Visual Acuity Right Eye Distance:   Left Eye Distance:   Bilateral Distance:    Right Eye Near:   Left Eye  Near:    Bilateral Near:     Physical Exam Vitals signs and nursing note reviewed.  Constitutional:      General: She is not in acute distress.    Appearance: She is not toxic-appearing or diaphoretic.  Skin:    Findings: Rash (vesicular, erythematous rash on right  buttock) present.  Neurological:     Mental Status: She is alert.      UC Treatments / Results  Labs (all labs ordered are listed, but only abnormal results are displayed) Labs Reviewed - No data to display  EKG   Radiology No results found.  Procedures Procedures (including critical care time)  Medications Ordered in UC Medications - No data to display  Initial Impression / Assessment and Plan / UC Course  I have reviewed the triage vital signs and the nursing notes.  Pertinent labs & imaging results that were available during my care of the patient were reviewed by me and considered in my medical decision making (see chart for details).      Final Clinical Impressions(s) / UC Diagnoses   Final diagnoses:  Herpes zoster without complication    ED Prescriptions    Medication Sig Dispense Auth. Provider   valACYclovir (VALTREX) 1000 MG tablet Take 1 tablet (1,000 mg total) by mouth 3 (three) times daily. 21 tablet Payton Mccallum, MD   HYDROcodone-acetaminophen (NORCO/VICODIN) 5-325 MG tablet 1-2 tabs po qd prn 6 tablet Payton Mccallum, MD     1. diagnosis reviewed with patient 2. rx as per orders above; reviewed possible side effects, interactions, risks and benefits; after rx had been picked up, I spoke with patient by phone and informed her to take the valtrex twice a day (NOT 3 times per day) due to renal function/renal dose-patient verbalized understanding 3. Recommend supportive treatment with otc tylenol prn 4. Follow-up prn if symptoms worsen or don't improve   I have reviewed the PDMP during this encounter.   Payton Mccallum, MD 08/05/19 1758

## 2019-08-05 NOTE — ED Triage Notes (Addendum)
Patient in office today c/o rash right side of  buttocks area and travels down right leg pain x 5d ago burns  EJY:LTEI

## 2019-08-06 LAB — CMV DNA, QUANTITATIVE, PCR: CMV VIRAL LD: DETECTED — AB

## 2019-08-06 LAB — CMV VIRAL LD: Lab: DETECTED — AB

## 2019-08-06 NOTE — Unmapped (Signed)
Pt called on call TNC to report she went to her local urgent care today due to rash. She was diagnosed with shingles and prescribed Valtrex 1gm TID x 7 days. Pt wanted to confirm with transplant team dose was ok. Paged Dr. Latrelle Dodrill discussed dosing and advised pt to take   1 gram BID and follow up with primary team in the morning for final direction on frequency of Valtrex and length of treatment.   Message sent to primary coordinator.

## 2019-08-07 LAB — BK VIRUS QUANTITATIVE PCR, BLOOD: BK BLOOD RESULT: NOT DETECTED

## 2019-08-07 LAB — BK BLOOD COMMENT: Lab: 0

## 2019-08-20 ENCOUNTER — Ambulatory Visit: Admit: 2019-08-20 | Discharge: 2019-08-21 | Payer: MEDICARE

## 2019-08-20 LAB — CBC W/ AUTO DIFF
BASOPHILS ABSOLUTE COUNT: 0 10*9/L (ref 0.0–0.1)
BASOPHILS RELATIVE PERCENT: 0.4 %
EOSINOPHILS ABSOLUTE COUNT: 0.1 10*9/L (ref 0.0–0.4)
EOSINOPHILS RELATIVE PERCENT: 2.3 %
HEMOGLOBIN: 9.9 g/dL — ABNORMAL LOW (ref 13.5–16.0)
LARGE UNSTAINED CELLS: 2 % (ref 0–4)
LYMPHOCYTES ABSOLUTE COUNT: 0.7 10*9/L — ABNORMAL LOW (ref 1.5–5.0)
LYMPHOCYTES RELATIVE PERCENT: 18 %
MEAN CORPUSCULAR HEMOGLOBIN CONC: 32 g/dL (ref 31.0–37.0)
MEAN CORPUSCULAR HEMOGLOBIN: 25.1 pg — ABNORMAL LOW (ref 26.0–34.0)
MEAN CORPUSCULAR VOLUME: 78.5 fL — ABNORMAL LOW (ref 80.0–100.0)
MEAN PLATELET VOLUME: 6.3 fL — ABNORMAL LOW (ref 7.0–10.0)
MONOCYTES ABSOLUTE COUNT: 0.3 10*9/L (ref 0.2–0.8)
NEUTROPHILS ABSOLUTE COUNT: 2.8 10*9/L (ref 2.0–7.5)
NEUTROPHILS RELATIVE PERCENT: 69.4 %
PLATELET COUNT: 369 10*9/L (ref 150–440)
RED BLOOD CELL COUNT: 3.95 10*12/L — ABNORMAL LOW (ref 4.00–5.20)
RED CELL DISTRIBUTION WIDTH: 17.8 % — ABNORMAL HIGH (ref 12.0–15.0)
WBC ADJUSTED: 4 10*9/L — ABNORMAL LOW (ref 4.5–11.0)

## 2019-08-20 LAB — BASIC METABOLIC PANEL
ANION GAP: 9 mmol/L (ref 7–15)
BLOOD UREA NITROGEN: 23 mg/dL — ABNORMAL HIGH (ref 7–21)
CHLORIDE: 107 mmol/L (ref 98–107)
CREATININE: 1.59 mg/dL — ABNORMAL HIGH (ref 0.60–1.00)
EGFR CKD-EPI AA FEMALE: 47 mL/min/{1.73_m2} — ABNORMAL LOW (ref >=60)
EGFR CKD-EPI NON-AA FEMALE: 41 mL/min/{1.73_m2} — ABNORMAL LOW (ref >=60)
GLUCOSE RANDOM: 91 mg/dL (ref 70–179)
POTASSIUM: 4.6 mmol/L (ref 3.5–5.0)
SODIUM: 140 mmol/L (ref 135–145)

## 2019-08-20 LAB — URINALYSIS
GLUCOSE UA: NEGATIVE
KETONES UA: NEGATIVE
LEUKOCYTE ESTERASE UA: NEGATIVE
PH UA: 6 (ref 5.0–9.0)
PROTEIN UA: NEGATIVE
RBC UA: 20 /HPF — ABNORMAL HIGH (ref <4)
SPECIFIC GRAVITY UA: 1.02 (ref 1.005–1.040)
SQUAMOUS EPITHELIAL: 1 /HPF (ref 0–5)
UROBILINOGEN UA: 0.2
WBC UA: 2 /HPF (ref 0–5)

## 2019-08-20 LAB — PROTEIN / CREATININE RATIO, URINE: CREATININE, URINE: 102 mg/dL

## 2019-08-20 LAB — BLOOD UA

## 2019-08-20 LAB — BASOPHILS RELATIVE PERCENT: Basophils/100 leukocytes:NFr:Pt:Bld:Qn:Automated count: 0.4

## 2019-08-20 LAB — MAGNESIUM: Magnesium:MCnc:Pt:Ser/Plas:Qn:: 1.9

## 2019-08-20 LAB — PROTEIN URINE: Protein:MCnc:Pt:Urine:Qn:: 22.5

## 2019-08-20 LAB — GLUCOSE RANDOM: Glucose:MCnc:Pt:Ser/Plas:Qn:: 91

## 2019-08-20 LAB — PHOSPHORUS: Phosphate:MCnc:Pt:Ser/Plas:Qn:: 3

## 2019-08-21 LAB — CMV DNA, QUANTITATIVE, PCR: CMV QUANT: <50 [IU]/mL — ABNORMAL HIGH (ref <0)

## 2019-08-21 LAB — CMV QUANT: Lab: <50 — ABNORMAL HIGH

## 2019-08-23 LAB — BK VIRUS QUANTITATIVE PCR, BLOOD

## 2019-08-23 LAB — BK BLOOD COMMENT: Lab: 0

## 2019-08-26 NOTE — Unmapped (Signed)
[facility] Refill Coordination Note    Specialty Medication(s) to be Shipped:   Transplant: Myfortic 180mg     Other medication(s) to be shipped: calcitriol. Prednisone, omeprazole     Kimberly [name], DOB: [birth year]  Phone: [phone] (home)       All above HIPAA information was verified with patient.     Completed refill call assessment today to schedule patient's medication shipment from the [facility] [phone]).       Specialty medication(s) and dose(s) confirmed: Regimen is correct and unchanged.   Changes to medications: Rishika reports no changes at this time.  Changes to insurance: No  Questions for the pharmacist: No    Confirmed patient received Welcome Packet with first shipment. The patient will receive a drug information handout for each medication shipped and additional FDA Medication Guides as required.       DISEASE/MEDICATION-SPECIFIC INFORMATION        N/A    SPECIALTY MEDICATION ADHERENCE     Medication Adherence    Patient reported X missed doses in the last month: 0  Specialty Medication: Myfortic  Patient is on additional specialty medications: No  Patient is on more than two specialty medications: No  Any gaps in refill history greater than 2 weeks in the last 3 months: no  Demonstrates understanding of importance of adherence: yes  Informant: patient                Myfortic 180mg : Patient has 14 days of medication on hand      SHIPPING     Shipping address confirmed in Epic.     Delivery Scheduled: Yes, Expected medication delivery date: 08/30/19.     Medication will be delivered via UPS to the prescription address in Epic WAM.    Olga Millers   [facility] Specialty Technician

## 2019-08-27 NOTE — Unmapped (Signed)
[facility] [address] [place]  OUTPATIENT PHYSICAL THERAPY  08/26/2019  Note Type: Treatment Note       Patient Name: Kimberly [name]  Date of Birth:1980/12/12  Diagnosis:   Encounter Diagnoses   Name Primary?   ??? Lymphedema Yes   ??? Decreased range of motion      Referring MD:  Estevan Oaks, MD     Plan of Care Effective Date:       Assessment & Plan     Assessment details: 38 y.o. 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    Waiting to hear back from pt's transplant MD regarding if her new kidney can accept fluid overload that occurs during MLD treatment and if pt's L arm fistula can withstand compression bandaging overtop as part of Complete Decongestive Therapy for her LUE/trunk.  Pt states that her labs have been good and that she is increasing her urine output.  She states that her L arm has swelled more since our last session and that she feels puffy all over.  Pt received some MLD today and felt less tight following treatment.  She also reported that she felt that she needed to urinate following treatment today.  Pt was given a written/pictorial hand-out on self MLD for LUE lymphedema following treatment today, which will be further discussed at next treatment session.  She was given written materials to purchase compression bandages to begin CDT once we hear back from her MD about her new kidney status and her fistula.        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 Improve skin condition as evidenced by increased hydration, palpable decrease in fibrosis, decreased pitting, and increased scar mobility to improve ROM for functional limb use and the ability for the quadrant to decongest.    Decrease limb volume by 10-20% or as needed for proper fit of garments and clothing, and to reduce infection risk.    Improve shoulder ROM to optimal functional range in ADL's (home, work , recreational and community)       In 6-12 months: Pt and/or caregiver will:    Be independent with home management of self-care related to lymphedema to reduce infection risk and recurrent hospitalizations.    Retain an optimal limb volume that allows patient to fit into appropriate compression garments and clothing to improve functional use of UE and QOL.    Stabilize/reduce deterioration of shoulder ROM in optimal functional range in all ADL's by providing pt education/assistive devices to ensure safety and effectiveness in shoulder mobility and function on an ongoing basis        Plan  Therapy options: will be seen for skilled physical therapy services    Planned therapy interventions: compression bandaging, Compression pump, education - family/caregiver, education - patient, garment measurement, home exercise program, manual lymph drainage, manual therapy, self-care / home training, taping, therapeutic activities and therapeutic exercises    Other planned therapy interventions: LLLT and lymphatouch  as needed to address fibrosis, scarring and pain    PT/DME Equipment: compression garments.      Education provided to: patient.  Education provided: anatomy, community resources, HEP, lymphedema precautions, symptom management, treatment options and plan, garment options, DME education and equipment recommendations  Education Results: needs reinforcement, verbalized good understanding and demonstrates understanding. Next visit plan: Will begin CDT at next session provided MD replies to e-mail.  Instruct on self MLD if pt able to tol last treatment session  Total Session Time: 55  Treatment rendered today: 1 unit of self-care x 15 min  - instruction/written material on compression bandaging supplies to be purchased if we proceed with CDT    3 units of manual therapy x 40 min  - MLD performed for LUE using L to R AAA, L AIA (supine/prone), focus on [place] and very light MLD techniques on LUE, including deep breathing for deep abdominals.  Plan details: Within a 90 day certification period, schedule permitting, decreasing frequency as able, patient will participate in the following as needed: Manual lymphatic drainage, compression bandaging or use of reduction kits, appropriate garment recommendations, skin care and therapeutic exercise, manual therapy, low level laser therapy, lymphatouch, ROM and strength training,balance retraining,  education on posture and body mechanics, taping, and pump/equipment  recommendations.      Attendance Policy:  I reviewed the no-show/attendance policy with the patient and caregiver(s). The family is aware that they must call to cancel appointments more than 24 hours in advance. They are also aware that if they late cancel or no-show three times, we reserve the right to cancel their remaining appointments. This policy is in place to allow [place] to best serve the needs of our caseload.                Subjective     History of Present Illness              Subjective: I just feel puffy all over.          Pain  No pain reported                                                                                    Objective     Static Posture     Static Posture Comments  Swelling:  L arm/elbow/forearm/hand/fingers  L shoulder  L upper chest wall    I attest that I have reviewed the above information.  Signed: Romero Liner, PT  08/26/2019 4:04 PM

## 2019-08-28 ENCOUNTER — Ambulatory Visit: Admit: 2019-08-28 | Discharge: 2019-09-26 | Payer: MEDICARE

## 2019-08-28 ENCOUNTER — Institutional Professional Consult (permissible substitution): Admit: 2019-08-28 | Discharge: 2019-09-26 | Payer: MEDICARE

## 2019-08-28 ENCOUNTER — Ambulatory Visit: Admit: 2019-08-28 | Discharge: 2019-09-26 | Payer: MEDICARE | Attending: Nephrology | Primary: Nephrology

## 2019-08-29 MED FILL — PREDNISONE 2.5 MG TABLET: ORAL | 30 days supply | Qty: 90 | Fill #1

## 2019-08-29 MED FILL — MYFORTIC 180 MG TABLET,DELAYED RELEASE: 30 days supply | Qty: 240 | Fill #3 | Status: AC

## 2019-08-29 MED FILL — CALCITRIOL 0.25 MCG CAPSULE: ORAL | 30 days supply | Qty: 30 | Fill #8

## 2019-08-29 MED FILL — MYFORTIC 180 MG TABLET,DELAYED RELEASE: ORAL | 30 days supply | Qty: 240 | Fill #3

## 2019-08-29 MED FILL — CALCITRIOL 0.25 MCG CAPSULE: 30 days supply | Qty: 30 | Fill #8 | Status: AC

## 2019-08-29 MED FILL — OMEPRAZOLE 40 MG CAPSULE,DELAYED RELEASE: ORAL | 30 days supply | Qty: 30 | Fill #8

## 2019-08-29 MED FILL — OMEPRAZOLE 40 MG CAPSULE,DELAYED RELEASE: 30 days supply | Qty: 30 | Fill #8 | Status: AC

## 2019-08-29 MED FILL — PREDNISONE 2.5 MG TABLET: 30 days supply | Qty: 90 | Fill #1 | Status: AC

## 2019-08-29 NOTE — Unmapped (Signed)
[facility] [address] [place]  OUTPATIENT PHYSICAL THERAPY  08/28/2019  Note Type: Treatment Note       Patient Name: Kimberly [name]  Date of Birth:1981/07/12  Diagnosis: No diagnosis found.  Referring MD:  Estevan Oaks, MD     Plan of Care Effective Date:       Assessment & Plan     Assessment details: 38 y.o. 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    Dr. Nestor Lewandowsky has confirmed that pt's new kidney transplant can tolerate excess fluid from MLD techniques and that pt's L hand and forearm can receive compression bandaging techniques.  No compression should be applied over pt's LUE fistula.  Pt reports that she tol MLD from last session very well, although she had increased urine output 1 day later, as opposed to that same day.  But that seems to be the way it is with this kidney.  Pt states she is monitoring urine output closely and will continue to do so.  New plan is set (see plan below).    Pt's arm looks and feels less swollen today.  Pt was very pleased to know that we can continue MLD and begin compression bandaging for her L forearm/hand.  Today's treatment included pt ed on self MLD home exercise program and self wrap techniques for L hand/fingers/wrist and forearm.  Pt instructed to remove bandages if painful or uncomfortable.  She also is instructed to practice the wrapping techniques at home when possible before next treatment session.    Garment Recommendation:  Since pt cannot have compression over fistula, recommendation is as follows: 2 sets LUE Elvarex soft custom glove and forearm sleeve 20-30 mmHg, one set for wash and one set for wear.        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    Improve skin condition as evidenced by increased hydration, palpable decrease in fibrosis, decreased pitting, and increased scar mobility to improve ROM for functional limb use and the ability for the quadrant to decongest.    Decrease limb volume by 10-20% or as needed for proper fit of garments and clothing, and to reduce infection risk.    Improve shoulder ROM to optimal functional range in ADL's (home, work , recreational and community)       In 6-12 months: Pt and/or caregiver will:    Be independent with home management of self-care related to lymphedema to reduce infection risk and recurrent hospitalizations.    Retain an optimal limb volume that allows patient to fit into appropriate compression garments and clothing to improve functional use of UE and QOL.    Stabilize/reduce deterioration of shoulder ROM in optimal functional range in all ADL's by providing pt education/assistive devices to ensure safety and effectiveness in shoulder mobility and function on an ongoing basis        Plan  Therapy options: will be seen for skilled physical therapy services    Planned therapy interventions:  compression bandaging, Compression pump, education - family/caregiver, education - patient, garment measurement, home exercise program, manual lymph drainage, manual therapy, self-care / home training, taping, therapeutic activities and therapeutic exercises    Other planned therapy interventions: LLLT and lymphatouch as needed to address fibrosis, scarring and pain    PT/DME Equipment: compression garments.  Frequency: 3x week    Duration in weeks: 3 weeks (for intense phase of CDT), 3 weeks off for fitting of garment, 1x wk for 2 wks    Education provided to: patient. Education provided: anatomy, community resources, HEP, lymphedema precautions, symptom management, treatment options and plan, garment options, DME education and equipment recommendations  Education Results: needs reinforcement, verbalized good understanding and demonstrates understanding.    Next visit plan: Review self MLD and review bandaging techniques.  Progress MLD and bandaging.  Give pt info on vendor/fitting.  Total Session Time: 55  Treatment rendered today: 1 unit of self care x 15 min  - pt instructed on needs for compression bandaging and where/how to purchase including short-stretch bandages and Circaid sleeve liner    2 units of therapeutic exercise x 25 min  - instruction provided on self MLD HEP with written/pictorial sheet    1 unit of therapeutic activity x 15 min  - pt instructed on how to perform self-wrapping techniques of short-stretch bandages for L hand/fingers/wrist/forearm for don/doffing of self-wrap independently in order to maintain gains made during MLD techniques at decreasing swelling.  Video captured during instruction.  Pt was able to verbalize understanding.            Subjective     History of Present Illness              Subjective: I peed more yesterday than the day before.          Pain  No pain reported                                                                                    Objective    1 unit self-care  2 units of TE  1 units of TA      I attest that I have reviewed the above information.  Signed: Romero Liner, PT  08/28/2019 4:29 PM

## 2019-08-30 LAB — CBC W/ AUTO DIFF
BASOPHILS RELATIVE PERCENT: 0.3 %
EOSINOPHILS ABSOLUTE COUNT: 0 10*9/L (ref 0.0–0.4)
EOSINOPHILS RELATIVE PERCENT: 0.4 %
HEMATOCRIT: 34.1 % — ABNORMAL LOW (ref 36.0–46.0)
HEMOGLOBIN: 10.7 g/dL — ABNORMAL LOW (ref 12.0–16.0)
LARGE UNSTAINED CELLS: 1 % (ref 0–4)
LYMPHOCYTES ABSOLUTE COUNT: 0.9 10*9/L — ABNORMAL LOW (ref 1.5–5.0)
LYMPHOCYTES RELATIVE PERCENT: 13.1 %
MEAN CORPUSCULAR HEMOGLOBIN CONC: 31.3 g/dL (ref 31.0–37.0)
MEAN CORPUSCULAR HEMOGLOBIN: 25.6 pg — ABNORMAL LOW (ref 26.0–34.0)
MEAN PLATELET VOLUME: 8.3 fL (ref 7.0–10.0)
MONOCYTES ABSOLUTE COUNT: 0.3 10*9/L (ref 0.2–0.8)
MONOCYTES RELATIVE PERCENT: 5.1 %
NEUTROPHILS RELATIVE PERCENT: 79.6 %
PLATELET COUNT: 453 10*9/L — ABNORMAL HIGH (ref 150–440)
RED BLOOD CELL COUNT: 4.17 10*12/L (ref 4.00–5.20)
RED CELL DISTRIBUTION WIDTH: 17.4 % — ABNORMAL HIGH (ref 12.0–15.0)
WBC ADJUSTED: 6.7 10*9/L (ref 4.5–11.0)

## 2019-08-30 LAB — URINALYSIS
BILIRUBIN UA: NEGATIVE
GLUCOSE UA: NEGATIVE
KETONES UA: NEGATIVE
LEUKOCYTE ESTERASE UA: NEGATIVE
NITRITE UA: NEGATIVE
PH UA: 5 (ref 5.0–9.0)
PROTEIN UA: 30 — AB
SPECIFIC GRAVITY UA: 1.02 (ref 1.003–1.030)
SQUAMOUS EPITHELIAL: <1 /HPF (ref 0–5)
UROBILINOGEN UA: 0.2
WBC UA: 13 /HPF — ABNORMAL HIGH (ref 0–5)

## 2019-08-30 LAB — PROTEIN / CREATININE RATIO, URINE
CREATININE, URINE: 114.3 mg/dL
PROTEIN/CREAT RATIO, URINE: 0.406

## 2019-08-30 LAB — BASIC METABOLIC PANEL
BLOOD UREA NITROGEN: 26 mg/dL — ABNORMAL HIGH (ref 7–21)
BUN / CREAT RATIO: 16
CALCIUM: 9.9 mg/dL (ref 8.5–10.2)
CHLORIDE: 107 mmol/L (ref 98–107)
CO2: 25 mmol/L (ref 22.0–30.0)
CREATININE: 1.63 mg/dL — ABNORMAL HIGH (ref 0.60–1.00)
EGFR CKD-EPI AA FEMALE: 45 mL/min/{1.73_m2} — ABNORMAL LOW (ref >=60)
EGFR CKD-EPI NON-AA FEMALE: 39 mL/min/{1.73_m2} — ABNORMAL LOW (ref >=60)
GLUCOSE RANDOM: 86 mg/dL (ref 70–179)
SODIUM: 139 mmol/L (ref 135–145)

## 2019-08-30 LAB — CREATININE, URINE: Lab: 114.3

## 2019-08-30 LAB — SODIUM: Sodium:SCnc:Pt:Ser/Plas:Qn:: 139

## 2019-08-30 LAB — SMEAR REVIEW

## 2019-08-30 LAB — MUCUS

## 2019-08-30 LAB — SLIDE REVIEW

## 2019-08-30 LAB — PHOSPHORUS: Phosphate:MCnc:Pt:Ser/Plas:Qn:: 2.9

## 2019-08-30 LAB — MEAN CORPUSCULAR VOLUME: Lab: 81.9

## 2019-08-30 LAB — MAGNESIUM: Magnesium:MCnc:Pt:Ser/Plas:Qn:: 1.9

## 2019-08-30 NOTE — Unmapped (Signed)
1323: Patient arrived Transplant Infusion Room today for Belatacept, Condition: well; Mobility: ambulating; accompanied by self.  1327: VS stable,  1332: PIV placed with brisk blood return.  1338: Infusion initiated.  1408: Infusion complete.  1414: VS stable, PIV removed, pt left clinic, Condition: well; Mobility: ambulating; accompanied by self.  See Flowsheet and MAR for all details of visit.

## 2019-08-31 NOTE — Unmapped (Signed)
[facility] [address] [place]  OUTPATIENT PHYSICAL THERAPY  08/30/2019  Note Type: Treatment Note       Patient Name: Kimberly [name]  Date of Birth:[birth year]  Diagnosis:   Encounter Diagnoses   Name Primary?   ??? Lymphedema Yes   ??? Decreased range of motion      Referring MD:  Estevan Oaks, MD     Plan of Care Effective Date:       Assessment & Plan     Assessment details: 38 y.o. 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    Pt stated she had trouble wrapping her arm and wants to go over it again.  She arrived without compression wraps on.    Garment Recommendation:  Since pt cannot have compression over fistula, recommendation is as follows: 2 sets LUE Elvarex soft custom glove and forearm sleeve 20-30 mmHg, one set for wash and one set for wear.        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    Improve skin condition as evidenced by increased hydration, palpable decrease in fibrosis, decreased pitting, and increased scar mobility to improve ROM for functional limb use and the ability for the quadrant to decongest.    Decrease limb volume by 10-20% or as needed for proper fit of garments and clothing, and to reduce infection risk.    Improve shoulder ROM to optimal functional range in ADL's (home, work , recreational and community)       In 6-12 months: Pt and/or caregiver will:    Be independent with home management of self-care related to lymphedema to reduce infection risk and recurrent hospitalizations. Retain an optimal limb volume that allows patient to fit into appropriate compression garments and clothing to improve functional use of UE and QOL.    Stabilize/reduce deterioration of shoulder ROM in optimal functional range in all ADL's by providing pt education/assistive devices to ensure safety and effectiveness in shoulder mobility and function on an ongoing basis        Plan  Therapy options: will be seen for skilled physical therapy services    Planned therapy interventions: compression bandaging, Compression pump, education - family/caregiver, education - patient, garment measurement, home exercise program, manual lymph drainage, manual therapy, self-care / home training, taping, therapeutic activities and therapeutic exercises    Other planned therapy interventions: LLLT and lymphatouch as needed to address fibrosis, scarring and pain    PT/DME Equipment: compression garments.  Frequency: 3x week    Duration in weeks: 3 weeks (for intense phase of CDT), 3 weeks off for fitting of garment, 1x wk for 2 wks    Education provided to: patient.  Education provided: anatomy, community resources, HEP, lymphedema precautions, symptom management, treatment options and plan, garment options, DME education and equipment recommendations  Education Results: needs reinforcement, verbalized good understanding and demonstrates understanding.    Next visit plan: Review self MLD and review bandaging techniques.  Progress MLD  and bandaging.  Give pt info on vendor/fitting.  Total Session Time: 55  Treatment rendered today: 2 units of manual therapy x 30 min  - MLD performed for LUE lymhedema using appropriate anastomoses.  MLD to arm included areas around fistula.    2 units of therapeutic activity x 25 min - pt instructed on how to perform self-wrapping techniques of short-stretch bandages for L hand/fingers/wrist/forearm for don/doffing of self-wrap independently in order to maintain gains made during MLD techniques at decreasing swelling.  Pt was able to demonstrate understanding.            Subjective     History of Present Illness              Subjective: I had trouble with the tension of the wraps.  I did pee out the excess fluid immediately after doing self MLD.          Pain  No pain reported                                                                                    Objective     Static Posture     Static Posture Comments  Swelling:  LUE      I attest that I have reviewed the above information.  Signed: Romero Liner, PT  08/30/2019 5:40 PM

## 2019-09-02 NOTE — Unmapped (Signed)
[facility] [address] [place]  OUTPATIENT PHYSICAL THERAPY  09/02/2019          Patient Name: Kimberly [name]  Date of Birth:12-27-1980  Diagnosis: No diagnosis found.  Referring MD:  Estevan Oaks, MD     Plan of Care Effective Date:       Assessment & Plan     Assessment details: 38 y.o. 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    Pt's R hand/forearm swelling improved today.  She states she has been performing the self MLD for her LUE and wrapping her LUE independently.  It takes her 45 min to do all of that.  She also states her labs are looking good and she has increased urine output following MLD treatments.  Swelling persists in L elbow so adjusted bandaging to address this without compressing the fistula.    Garment Recommendation:  Since pt cannot have compression over fistula, recommendation is as follows: 2 sets LUE Elvarex soft custom glove and forearm sleeve 20-30 mmHg, one set for wash and one set for wear.        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    Improve skin condition as evidenced by increased hydration, palpable decrease in fibrosis, decreased pitting, and increased scar mobility to improve ROM for functional limb use and the ability for the quadrant to decongest.    Decrease limb volume by 10-20% or as needed for proper fit of garments and clothing, and to reduce infection risk.    Improve shoulder ROM to optimal functional range in ADL's (home, work , recreational and community)       In 6-12 months: Pt and/or caregiver will: Be independent with home management of self-care related to lymphedema to reduce infection risk and recurrent hospitalizations.    Retain an optimal limb volume that allows patient to fit into appropriate compression garments and clothing to improve functional use of UE and QOL.    Stabilize/reduce deterioration of shoulder ROM in optimal functional range in all ADL's by providing pt education/assistive devices to ensure safety and effectiveness in shoulder mobility and function on an ongoing basis        Plan  Therapy options: will be seen for skilled physical therapy services    Planned therapy interventions: compression bandaging, Compression pump, education - family/caregiver, education - patient, garment measurement, home exercise program, manual lymph drainage, manual therapy, self-care / home training, taping, therapeutic activities and therapeutic exercises    Other planned therapy interventions: LLLT and lymphatouch as needed to address fibrosis, scarring and pain    PT/DME Equipment: compression garments.  Frequency: 3x week    Duration in weeks: 3 weeks (for intense phase of CDT), 3 weeks off for fitting of garment, 1x wk for 2 wks    Education provided to: patient.  Education provided: anatomy, community resources, HEP, lymphedema precautions, symptom management, treatment options and plan, garment options, DME education and equipment recommendations  Education Results:  needs reinforcement, verbalized good understanding and demonstrates understanding.    Next visit plan: Consider foam for forearm and kinesio taping for MCPs.  Progress MLD.  Give sh stretches.  Give vendor info. Request script from MD  Total Session Time: 55  Treatment rendered today: 4 units of manual therapy x 55 min - MLD performed for LUE lymhedema using appropriate anastomoses.  MLD to arm included areas around fistula.  Manual techniques used with compression bandaging in order to create a compression gradient to decrease swelling.  Use of foam at wrist with added cotton.  Added coban to fingers to address finger wraps coming off and swelling at MCPs.              Subjective     History of Present Illness              Subjective: It went better.  My hand gets a little bit 'pillowy' though right there at my knuckles.                                      Objective     Static Posture     Static Posture Comments  Swelling:  L MCPs and L elbow - addressed today with coban on fingers to increase ability for finger wraps to remain on and varied spiral technique at L elbow with short-stretch bandages in order to avoid fistula but also cover elbow.      I attest that I have reviewed the above information.  Signed: Romero Liner, PT  09/02/2019 9:11 AM

## 2019-09-03 LAB — CMV DNA, QUANTITATIVE, PCR

## 2019-09-03 LAB — CMV VIRAL LD: Lab: DETECTED — AB

## 2019-09-04 DIAGNOSIS — Z940 Kidney transplant status: Principal | ICD-10-CM

## 2019-09-04 DIAGNOSIS — D899 Disorder involving the immune mechanism, unspecified: Principal | ICD-10-CM

## 2019-09-04 DIAGNOSIS — Z0000 Encounter for general adult medical examination without abnormal findings: Principal | ICD-10-CM

## 2019-09-04 LAB — CBC W/ AUTO DIFF
BASOPHILS ABSOLUTE COUNT: 0 10*9/L (ref 0.0–0.1)
BASOPHILS RELATIVE PERCENT: 0.5 %
EOSINOPHILS RELATIVE PERCENT: 1.8 %
HEMATOCRIT: 32.7 % — ABNORMAL LOW (ref 36.0–46.0)
HEMOGLOBIN: 10.3 g/dL — ABNORMAL LOW (ref 12.0–16.0)
LARGE UNSTAINED CELLS: 3 % (ref 0–4)
LYMPHOCYTES RELATIVE PERCENT: 19.3 %
MEAN CORPUSCULAR HEMOGLOBIN CONC: 31.5 g/dL (ref 31.0–37.0)
MEAN CORPUSCULAR HEMOGLOBIN: 25.7 pg — ABNORMAL LOW (ref 26.0–34.0)
MEAN CORPUSCULAR VOLUME: 81.5 fL (ref 80.0–100.0)
MEAN PLATELET VOLUME: 8.1 fL (ref 7.0–10.0)
MONOCYTES ABSOLUTE COUNT: 0.5 10*9/L (ref 0.2–0.8)
MONOCYTES RELATIVE PERCENT: 9.3 %
NEUTROPHILS ABSOLUTE COUNT: 3.3 10*9/L (ref 2.0–7.5)
NEUTROPHILS RELATIVE PERCENT: 66.4 %
PLATELET COUNT: 413 10*9/L (ref 150–440)
RED BLOOD CELL COUNT: 4.01 10*12/L (ref 4.00–5.20)
WBC ADJUSTED: 4.9 10*9/L (ref 4.5–11.0)

## 2019-09-04 LAB — IRON: Iron:MCnc:Pt:Ser/Plas:Qn:: 30 — ABNORMAL LOW

## 2019-09-04 LAB — BK BLOOD LOG(10): Lab: 0

## 2019-09-04 LAB — FERRITIN: Ferritin:MCnc:Pt:Ser/Plas:Qn:: 14.6

## 2019-09-04 LAB — PHOSPHORUS: Phosphate:MCnc:Pt:Ser/Plas:Qn:: 3.2

## 2019-09-04 LAB — BASIC METABOLIC PANEL
ANION GAP: 10 mmol/L (ref 7–15)
BLOOD UREA NITROGEN: 25 mg/dL — ABNORMAL HIGH (ref 7–21)
BUN / CREAT RATIO: 15
CALCIUM: 9.8 mg/dL (ref 8.5–10.2)
CHLORIDE: 103 mmol/L (ref 98–107)
CREATININE: 1.63 mg/dL — ABNORMAL HIGH (ref 0.60–1.00)
EGFR CKD-EPI AA FEMALE: 45 mL/min/{1.73_m2} — ABNORMAL LOW (ref >=60)
EGFR CKD-EPI NON-AA FEMALE: 39 mL/min/{1.73_m2} — ABNORMAL LOW (ref >=60)
GLUCOSE RANDOM: 98 mg/dL (ref 70–99)
POTASSIUM: 4.4 mmol/L (ref 3.5–5.0)

## 2019-09-04 LAB — IRON & TIBC
IRON SATURATION (CALC): 7 % — ABNORMAL LOW (ref 15–50)
IRON: 30 ug/dL — ABNORMAL LOW (ref 35–165)

## 2019-09-04 LAB — EOSINOPHILS ABSOLUTE COUNT: Eosinophils:NCnc:Pt:Bld:Qn:Automated count: 0.1

## 2019-09-04 LAB — BK VIRUS QUANTITATIVE PCR, BLOOD

## 2019-09-04 LAB — BLOOD UREA NITROGEN: Urea nitrogen:MCnc:Pt:Ser/Plas:Qn:: 25 — ABNORMAL HIGH

## 2019-09-04 LAB — MAGNESIUM: Magnesium:MCnc:Pt:Ser/Plas:Qn:: 1.8

## 2019-09-04 MED ORDER — CHLORTHALIDONE 25 MG TABLET
ORAL_TABLET | Freq: Every day | ORAL | 0 refills | 60 days | Status: CP | PRN
Start: 2019-09-04 — End: 2020-09-03

## 2019-09-04 NOTE — Unmapped (Signed)
AOBP performed right arm, mediumcuff.   1st reading - 135/82, p 85   2nd reading - 128/80, p 85    3rd reading- 119/78, p 83   Average - 127/80, p 84

## 2019-09-04 NOTE — Unmapped (Signed)
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  2nd kidney transplant - 2000-2002 - DD failed chronic rejection.  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 Her last dialysis was on 03/07/19.  Has a good U.O.    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    Creatinine - value 1.63 , stable  Urine analysis: protein 30 mg/dl / WBC 13 / RBC 37 (16/10/96)  Urine protein/creatinine ratio: 0.406 (08/30/19)  DSA: negative - 07/08/19    Last CMV checked: Detected  <50 08/30/19  Last BKV checked: Few atypical cells (see comment), No definitive decoy cells identified (08/20/19)    2) Immunosuppression: Myfortic 720 mg BID / Prednisone 5 mg daily / Belatacept monthly (last on 06/28/19)  - Changes in Immunosuppression - none   - On belatacept due to biopsy findings.  - Medications side effects: No     3) UTI  - History of recent recurrent UTI's. No UTI symptoms today.   - Previously referred to urogyn given risk of future antibiotic resistance    4) Hypotension - Controlled - off midodrine now.  Goal for B.P > 100 systolic.   Changes in B.P medications - none.    5) Anemia - stable - Hgb 10.3  Goal for Hemoglobin > 11.0  Ferritin 34.1 (02/25/19)   - Continue iron supplement once daily. Also previously suggested she look up iron-rich foods to incorporate into her diet    6) MBD - Calcium 9.8 / Phosphorus 3.2 - Secondary hyperparathyroidism  On calcitriol 0.25 mcg daily    7) Electrolytes: WNL    8) Leg swelling   - Will start diuretic (chlorthalidone) 12.5mg  PRN.     8) Immunizations:  Influenza (inactivated only): received 07/2019  Pneumococcal vaccination (inactivated only): will ask patient    9) Cancer screening:  PAP smear - 3/15 negative   Mammogram - Age 38.    10) Left UE swelling - patient following up in lymphedema clinic.    Follow up: 3 months or sooner if indicated.      History of Presenting Illness:     Since patient's last visit in the transplant clinic - patient has been doing well in terms of transplant, taking transplant medications regularly, no episodes of rejection and no side effects of medications.    She received belatacept infusions 375 mg, (05/31/19), (06/28/19.), (07/26/2019), and (08/30/2019). 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    She presents today for an in-person clinic visit.      Today, the patient reports some leg swelling, which appears to increase towards the end of the day. She states she is  not currently taking a diuretic. She reports she is currently taking her Myfortic and prednisone as prescribed.     She denies shortness of breath, nausea, vomiting, or reduced appetitie.      Diabetes: No   HTN: Yes: Hypotension    Controlled:Yes   Adherence      With Medication: yes    With Follow up: yes    Functional Status: independent    Patient was found to have reflux nephropathy at age 38 and was on peritoneal dialysis briefly before receiving a living related renal transplant from mother. She had some thrombotic complication and underwent nephrectomy in 1997. She started dialysis and had a second kidney transplant at age 38 in 20. 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 She received a 3rd kidney transplant in 11/2002 which failed in 2007 due to chronic rejection.    She has decreased vision both eyes.    Review of Systems:     Fever or chills: negative   Sore throat: negative   Fatigue/malaise: negative   Weight loss or gain: negative   New skin rash/lump or bump: negative   Problems with teeth/gums: negative   Chest pain: negative   Cough or shortness of breath: negative   Swelling: Leg swelling.  Abdominal pain/heartburn/nausea/vomiting or diarrhea: negative   Pain or bleeding when urinating: negative   Twitching/numbness or weakness: negative     Physical Exam:     Vital signs: BP 127/80 (BP Site: R Arm, BP Position: Sitting, BP Cuff Size: Medium)  - Pulse 84  - Temp 36 ??C (96.8 ??F) (Temporal)  - Wt 77.9 kg (171 lb 12.8 oz)  - LMP 09/03/2019  - BMI 29.49 kg/m??    Nursing note and vitals reviewed.   Constitutional: Oriented to person, place, and time. Appears well-developed and well-nourished. No distress.   HENT: Patient wearing mask  Head: Normocephalic and atraumatic.   Eyes: Right eye exhibits no discharge. Left eye exhibits no discharge. No scleral icterus.   Neck: wearing mask  Cardiovascular: Normal rate and regular rhythm. Exam reveals no gallop and no friction rub. No murmur heard.   Pulmonary/Chest: Effort normal and breath sounds normal. No respiratory distress.   Abdominal: Soft. Non tender  Musculoskeletal: Normal range of motion.trace to 1+ edema B/L L.E, Left U.E 3+ lymphedema.   Neurological: Alert and oriented to person, place, and time.   Skin: Skin is warm and dry. No rash noted. Not diaphoretic. No erythema. No pallor.   Psychiatric: Normal mood and affect.     Renal Transplant History:    Race:  Caucasian   Age of recipient (at time of transplant): 38 y.o.   Cause of kidney disease: Reflux nephropathy   Native biopsy: No    Date of transplant: 12/05/18   Type of transplant: KDPI - 39%    - Donor creatinine: 1.3    - Any co morbidities: No    - Infection in donor: No    - HLA Mismatch: N/A    - Ischemia time: 6h 28m    - Crossmatch: negative    - Donor kidney biopsy: No diagnostic abnormalities identified (12/05/18)     Induction: Campath - alemtuzumab   Maintenance IS at the time of transplant: tacrolimus, mycophenolate   DGF: Yes   Diabetes Onset after transplant: No    Allergies:   Allergies   Allergen Reactions   ??? Ancef [Cefazolin] Shortness Of Breath   ???  Adhesive Tape-Silicones      Other reaction(s): Other (See Comments)  Uncoded Allergy. Allergen: plastic tape, Other Reaction: blistering   ??? Demerol [Meperidine] Nausea And Vomiting        Current Medications:   Current Outpatient Medications   Medication Sig Dispense Refill   ??? acetaminophen (TYLENOL) 500 MG tablet Take 2 tablets (1,000 mg total) by mouth every six (6) hours as needed for pain. 100 tablet 0   ??? aspirin (ECOTRIN) 81 MG tablet Take 1 tablet (81 mg total) by mouth daily. 30 tablet 11   ??? calcitrioL (ROCALTROL) 0.25 MCG capsule Take 1 capsule (0.25 mcg total) by mouth daily. 30 capsule 11   ??? carboxymethylcellulose sodium (REFRESH CELLUVISC) 1 % DpGe Instill drops in affected eye(s) as directed as needed.     ??? chlorthalidone (HYGROTON) 25 MG tablet Take 0.5 tablets (12.5 mg total) by mouth daily as needed. 30 tablet 0   ??? ferrous sulfate 325 (65 FE) MG tablet Take 325 mg by mouth daily.     ??? MYFORTIC 180 mg EC tablet Take 4 tablets (720 mg total) by mouth Two (2) times a day. 240 tablet 11   ??? omeprazole (PRILOSEC) 40 MG capsule Take 1 capsule (40 mg total) by mouth daily. 30 capsule 11   ??? predniSONE (DELTASONE) 2.5 MG tablet Take 3 tablets (7.5 mg total) by mouth daily. 90 tablet 11   ??? sodium bicarbonate 650 mg tablet Take 1 tablet (650 mg total) by mouth Two (2) times a day. 180 tablet 3     No current facility-administered medications for this visit.        Past Medical History:   Past Medical History:   Diagnosis Date   ??? Bell's palsy    ??? Disease of thyroid gland    ??? ESRD (end stage renal disease) (CMS-HCC)    ??? Nonarteritic ischemic optic neuropathy    ??? Reflux nephropathy         Laboratory studies:     Recent Results (from the past 170 hour(s))   Urine Culture    Collection Time: 08/30/19  1:24 PM    Specimen: Clean Catch; Urine   Result Value Ref Range    Urine Culture, Comprehensive NO GROWTH    CMV DNA, quantitative, PCR    Collection Time: 08/30/19  1:24 PM   Result Value Ref Range    CMV Viral Ld Detected (A) Not Detected    CMV Quant <50 (H) <0 IU/mL    CMV Quant Log10      CMV Comment     BK Virus Quantitative PCR, Blood    Collection Time: 08/30/19  1:24 PM   Result Value Ref Range    BK Blood Result Not Detected Not Detected    BK Blood Quant      BK Blood Log(10)      Bk Blood Comment     Protein/Creatinine Ratio, Urine    Collection Time: 08/30/19  1:24 PM   Result Value Ref Range    Creat U 114.3 Undefined mg/dL    Protein, Ur 16.1 Undefined mg/dL    Protein/Creatinine Ratio, Urine 0.406 Undefined   Urinalysis    Collection Time: 08/30/19  1:24 PM   Result Value Ref Range    Color, UA Yellow     Clarity, UA Clear     Specific Gravity, UA 1.020 1.003 - 1.030    pH, UA 5.0 5.0 - 9.0    Leukocyte Esterase, UA  Negative Negative    Nitrite, UA Negative Negative    Protein, UA 30 mg/dL (A) Negative    Glucose, UA Negative Negative    Ketones, UA Negative Negative    Urobilinogen, UA 0.2 mg/dL 0.2 mg/dL, 1.0 mg/dL    Bilirubin, UA Negative Negative    Blood, UA Moderate (A) Negative    RBC, UA 37 (H) <=4 /HPF    WBC, UA 13 (H) 0 - 5 /HPF    Squam Epithel, UA <1 0 - 5 /HPF    Bacteria, UA Rare (A) None Seen /HPF    Mucus, UA Rare (A) None Seen /HPF   Phosphorus Level    Collection Time: 08/30/19  1:24 PM   Result Value Ref Range    Phosphorus 2.9 2.9 - 4.7 mg/dL   Basic Metabolic Panel    Collection Time: 08/30/19  1:24 PM   Result Value Ref Range    Sodium 139 135 - 145 mmol/L    Potassium 4.8 3.5 - 5.0 mmol/L    Chloride 107 98 - 107 mmol/L    CO2 25.0 22.0 - 30.0 mmol/L    Anion Gap 7 7 - 15 mmol/L    BUN 26 (H) 7 - 21 mg/dL    Creatinine 1.61 (H) 0.60 - 1.00 mg/dL    BUN/Creatinine Ratio 16     EGFR CKD-EPI Non-African American, Female 39 (L) >=60 mL/min/1.46m2    EGFR CKD-EPI African American, Female 45 (L) >=60 mL/min/1.70m2    Glucose 86 70 - 179 mg/dL    Calcium 9.9 8.5 - 09.6 mg/dL   Magnesium Level    Collection Time: 08/30/19  1:24 PM   Result Value Ref Range    Magnesium 1.9 1.6 - 2.2 mg/dL   CBC w/ Differential    Collection Time: 08/30/19  1:24 PM   Result Value Ref Range    WBC 6.7 4.5 - 11.0 10*9/L    RBC 4.17 4.00 - 5.20 10*12/L    HGB 10.7 (L) 12.0 - 16.0 g/dL    HCT 04.5 (L) 40.9 - 46.0 %    MCV 81.9 80.0 - 100.0 fL    MCH 25.6 (L) 26.0 - 34.0 pg    MCHC 31.3 31.0 - 37.0 g/dL    RDW 81.1 (H) 91.4 - 15.0 %    MPV 8.3 7.0 - 10.0 fL    Platelet 453 (H) 150 - 440 10*9/L    Neutrophils % 79.6 %    Lymphocytes % 13.1 %    Monocytes % 5.1 %    Eosinophils % 0.4 %    Basophils % 0.3 %    Absolute Neutrophils 5.3 2.0 - 7.5 10*9/L    Absolute Lymphocytes 0.9 (L) 1.5 - 5.0 10*9/L    Absolute Monocytes 0.3 0.2 - 0.8 10*9/L    Absolute Eosinophils 0.0 0.0 - 0.4 10*9/L    Absolute Basophils 0.0 0.0 - 0.1 10*9/L    Large Unstained Cells 1 0 - 4 %    Microcytosis Slight (A) Not Present    Anisocytosis Slight (A) Not Present    Hypochromasia Marked (A) Not Present   Morphology Review    Collection Time: 08/30/19  1:24 PM   Result Value Ref Range    Smear Review Comments See Comment (A) Undefined    Burr Cells Present (A) Not Present   Basic Metabolic Panel    Collection Time: 09/04/19  9:35 AM   Result Value Ref Range    Sodium 138 135 -  145 mmol/L    Potassium 4.4 3.5 - 5.0 mmol/L    Chloride 103 98 - 107 mmol/L    CO2 25.0 22.0 - 30.0 mmol/L    Anion Gap 10 7 - 15 mmol/L    BUN 25 (H) 7 - 21 mg/dL    Creatinine 1.61 (H) 0.60 - 1.00 mg/dL    BUN/Creatinine Ratio 15     EGFR CKD-EPI Non-African American, Female 39 (L) >=60 mL/min/1.74m2    EGFR CKD-EPI African American, Female 45 (L) >=60 mL/min/1.26m2    Glucose 98 70 - 99 mg/dL    Calcium 9.8 8.5 - 09.6 mg/dL   Phosphorus Level    Collection Time: 09/04/19  9:35 AM   Result Value Ref Range    Phosphorus 3.2 2.9 - 4.7 mg/dL   Magnesium Level    Collection Time: 09/04/19  9:35 AM   Result Value Ref Range    Magnesium 1.8 1.6 - 2.2 mg/dL   CBC w/ Differential    Collection Time: 09/04/19  9:35 AM   Result Value Ref Range    WBC 4.9 4.5 - 11.0 10*9/L    RBC 4.01 4.00 - 5.20 10*12/L    HGB 10.3 (L) 12.0 - 16.0 g/dL    HCT 04.5 (L) 40.9 - 46.0 %    MCV 81.5 80.0 - 100.0 fL    MCH 25.7 (L) 26.0 - 34.0 pg    MCHC 31.5 31.0 - 37.0 g/dL    RDW 81.1 (H) 91.4 - 15.0 %    MPV 8.1 7.0 - 10.0 fL    Platelet 413 150 - 440 10*9/L    Neutrophils % 66.4 %    Lymphocytes % 19.3 %    Monocytes % 9.3 %    Eosinophils % 1.8 %    Basophils % 0.5 %    Absolute Neutrophils 3.3 2.0 - 7.5 10*9/L    Absolute Lymphocytes 1.0 (L) 1.5 - 5.0 10*9/L    Absolute Monocytes 0.5 0.2 - 0.8 10*9/L    Absolute Eosinophils 0.1 0.0 - 0.4 10*9/L    Absolute Basophils 0.0 0.0 - 0.1 10*9/L    Large Unstained Cells 3 0 - 4 %    Microcytosis Slight (A) Not Present    Anisocytosis Slight (A) Not Present    Hypochromasia Marked (A) Not Present   Iron Profile    Collection Time: 09/04/19  9:35 AM   Result Value Ref Range    Iron 30 (L) 35 - 165 ug/dL    TIBC 782.9 562.1 - 308.6 mg/dL    Transferrin 578.4 696.2 - 380.0 mg/dL    Iron Saturation (%) 7 (L) 15 - 50 %     Scribe's Attestation: Leeroy Bock, MD obtained and performed the history, physical exam and medical decision making elements that were entered into the chart.  Signed by Napoleon Form, Scribe, on September 04, 2019 at 10:25 AM.    Documentation assistance provided by the Scribe. I was present during the time the encounter was recorded. The information recorded by the Scribe was done at my direction and has been reviewed and validated by me.

## 2019-09-04 NOTE — Unmapped (Signed)
[facility] [address] [place]  OUTPATIENT PHYSICAL THERAPY  09/04/2019  Note Type: Treatment Note       Patient Name: Kimberly [name]  Date of Birth:1981/04/25  Diagnosis:   Encounter Diagnoses   Name Primary?   ??? Lymphedema Yes   ??? Decreased range of motion      Referring MD:  Estevan Oaks, MD     Plan of Care Effective Date:       Assessment & Plan     Assessment details: 38 y.o. 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    Pt arrives today with her husband so that he learn how to assist with the MLD on her back.  Instructed pt's husband, Caryn Bee, and he demonstrated competence and understanding of lymph drainage and MLD techniques.  Pt's compression bandages slip down from the elbow.  She also states she has trouble with getting the right tension while using the coban for fingers.  Fingers were wrapped with Mollelast bandages which appeared to be staying on and compressing her fingers comfortably and entirely.  Less puffiness in hand today at MCPs noted.  Used kinesiotaping techniques for L upper arm to assist with lymph drainage from elbow to shoulder.  Will assess efficacy of taping techniques at next session.    Prescription request send to Dr. Tod Persia office today for custom compression garments for L hand/fingers/forearm.    Garment Recommendation:  Since pt cannot have compression over fistula, recommendation is as follows: 2 sets LUE Elvarex soft custom glove and forearm sleeve 20-30 mmHg, one set for wash and one set for wear.        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    Improve skin condition as evidenced by increased hydration, palpable decrease in fibrosis, decreased pitting, and increased scar mobility to improve ROM for functional limb use and the ability for the quadrant to decongest.    Decrease limb volume by 10-20% or as needed for proper fit of garments and clothing, and to reduce infection risk.    Improve shoulder ROM to optimal functional range in ADL's (home, work , recreational and community)       In 6-12 months: Pt and/or caregiver will:    Be independent with home management of self-care related to lymphedema to reduce infection risk and recurrent hospitalizations.    Retain an optimal limb volume that allows patient to fit into appropriate compression garments and clothing to improve functional use of UE and QOL.    Stabilize/reduce deterioration of shoulder ROM in optimal functional range in all ADL's by providing pt education/assistive devices to ensure safety and effectiveness in shoulder mobility and function on an ongoing basis        Plan  Therapy options: will be seen for skilled physical therapy services    Planned therapy interventions: compression bandaging, Compression pump, education - family/caregiver, education - patient, garment measurement, home exercise program, manual lymph drainage, manual therapy, self-care / home training, taping,  therapeutic activities and therapeutic exercises    Other planned therapy interventions: LLLT and lymphatouch as needed to address fibrosis, scarring and pain    PT/DME Equipment: compression garments.  Frequency: 3x week    Duration in weeks: 3 weeks (for intense phase of CDT), 3 weeks off for fitting of garment, 1x wk for 2 wks    Education provided to: patient. Education provided: anatomy, community resources, HEP, lymphedema precautions, symptom management, treatment options and plan, garment options, DME education and equipment recommendations  Education Results: needs reinforcement, verbalized good understanding and demonstrates understanding.    Next visit plan: Consider foam for forearm and assess kinesio taping for MCPs.  Progress MLD.  Give sh stretches.  Give vendor info.   Total Session Time: 55  Treatment rendered today: 1 unit of therapeutic activity x 15 min  - pt's husband instructed in MLD techniques to pt's back in order to increase lymph drainage from L axilla to R axilla and L inguinal nodes posteriorly.  Husband demonstrated understanding and good technique.    3 units of manual therapy x 40 min  - MLD performed for LUE lymhedema using appropriate anastomoses.  MLD to arm included areas around fistula.  Manual techniques used with compression bandaging in order to create a compression gradient to decrease swelling.  Use of foam at wrist with added cotton.  Added coban to fingers to address finger wraps coming off and swelling at MCPs.  - manually applied kinesiotaping techniques to L upper arm to address swelling issue with fistula.              Subjective     History of Present Illness              Subjective: I can see the tendons in my wrist for the first time in months!          Pain  No pain reported                                                                                    Objective     Static Posture     Static Posture Comments  Swelling:  LUE swelling, primarily in L upper arm due to fistula placement not allowing any compression in this area.      I attest that I have reviewed the above information.  Signed: Romero Liner, PT  09/04/2019 9:55 AM

## 2019-09-04 NOTE — Unmapped (Signed)
[name] PHARMACY NOTE  09/03/2019   Kimberly [name]  [id number]    Medication changes today:   1. STOP sodium bicarbonate  2. DECREASE FeSO4 325 mg to once daily  3. START chlorthalidone 12.5 mg PRN edema    Education/Adherence tools provided today:  1.provided additional education on immunosuppression and transplant related medications including reviewing indications of medications, dosing and side effects  2. provided additional education on food-drug interactions    Follow up items:  1. Vit D  2. Iron studies    Next visit with pharmacy in PRN  ____________________________________________________________________    Kimberly [name] is a 38 y.o. 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  3rd transplant failed in 2007.     Other PMH significant for parathyroidectomy, Bell's palsy, and transient optic neuropathy    Post op complicated by DGF. Last dialysis on 03/07/19. H/x of recurrent UTIs managed by [facility].    Seen by pharmacy today for: medication management; last seen by pharmacy 5 months ago     CC:  Patient has no complaints today     There were no vitals filed for this visit.    Allergies   Allergen Reactions   ??? Ancef [Cefazolin] Shortness Of Breath   ??? Adhesive Tape-Silicones      Other reaction(s): Other (See Comments)  Uncoded Allergy. Allergen: plastic tape, Other Reaction: blistering   ??? Demerol [Meperidine] Nausea And Vomiting       All medications reviewed and updated.     Medication list includes revisions made during today???s encounter    Outpatient Encounter Medications as of 09/04/2019   Medication Sig Dispense Refill   ??? acetaminophen (TYLENOL) 500 MG tablet Take 2 tablets (1,000 mg total) by mouth every six (6) hours as needed for pain. 100 tablet 0 ??? aspirin (ECOTRIN) 81 MG tablet Take 1 tablet (81 mg total) by mouth daily. 30 tablet 11   ??? calcitrioL (ROCALTROL) 0.25 MCG capsule Take 1 capsule (0.25 mcg total) by mouth daily. 30 capsule 11   ??? carboxymethylcellulose sodium (REFRESH CELLUVISC) 1 % DpGe Instill drops in affected eye(s) as directed as needed.     ??? ferrous sulfate 325 (65 FE) MG tablet Take 325 mg by mouth daily.     ??? MYFORTIC 180 mg EC tablet Take 4 tablets (720 mg total) by mouth Two (2) times a day. 240 tablet 11   ??? omeprazole (PRILOSEC) 40 MG capsule Take 1 capsule (40 mg total) by mouth daily. 30 capsule 11   ??? predniSONE (DELTASONE) 2.5 MG tablet Take 3 tablets (7.5 mg total) by mouth daily. 90 tablet 11   ??? sodium bicarbonate 650 mg tablet Take 1 tablet (650 mg total) by mouth Two (2) times a day. 180 tablet 3     No facility-administered encounter medications on file as of 09/04/2019.        Induction agent : alemtuzumab    CURRENT IMMUNOSUPPRESSION: belatacept 5 mg/kg IV q28 days     [id number]  mg PO bid    prednisone 5 mg daily    Patient is tolerating immunosuppression well    IMMUNOSUPPRESSION DRUG LEVELS:  Lab Results   Component Value Date    Tacrolimus, Trough 6.6 12/24/2018    Tacrolimus, Trough 6.5 12/21/2018    Tacrolimus, Trough 6.5 12/17/2018    Tacrolimus,  Timed <1.0 04/08/2019    Tacrolimus, Timed 1.1 04/04/2019    Tacrolimus, Timed 1.2 04/01/2019     Lab Results   Component Value Date    Cyclosporine, Trough <10 (L) 12/17/2018     No results found for: EVEROLIMUS  No results found for: SIROLIMUS    Graft function: stable strong UOP, drinking 1.3L - 2L/day  DSA: ntd  Biopsies to date: 02/19/19 - Diffuse mild to moderate acute tubular injury with some features 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 No evidence of rejection. Switched from tacrolimus to belatacept.   UPC: 0.14  WBC/ANC:  wnl    Plan: Will maintain current immunosuppression. Continue to monitor.    OI Prophylaxis: CMV Status: D+/ R+, moderate risk . CMV prophylaxis: completed x 3 months per protocol. (ended 03/05/19)  Lab Results   Component Value Date    CMV Quant <50 (H) 08/30/2019    CMV Quant <50 (H) 08/20/2019    CMV Quant <50 (H) 08/05/2019    CMV Quant 76 (H) 07/22/2019    CMV Quant 93 (H) 07/15/2019    CMV Quant 62 (H) 07/08/2019     PCP Prophylaxis: completed x 6 months. (ended 06/05/19)  Thrush: completed in hospital  Patient is  off prophylaxis    Plan: prophylaxis completed. Continue to monitor.    CV Prophylaxis: asa 81 mg   The ASCVD Risk score Denman George DC Montez Hageman, et al., 2013) failed to calculate.  Statin therapy: Not indicated  Plan: not indicated. Continue to monitor     BP: Goal < 140/90. Clinic vitals reported above  Home BP ranges: Did not bring log to clinic, but stated to be 120s-140s/70s. Stated to take home BP in the AM.  Current meds include: none  Plan: Continue to monitor    Anemia of CKD:  H/H:   Lab Results   Component Value Date    HGB 10.7 (L) 08/30/2019     Lab Results   Component Value Date    HCT 34.1 (L) 08/30/2019     Iron panel:  Lab Results   Component Value Date    IRON 35 02/22/2019    TIBC 373.2 02/22/2019    FERRITIN 34.1 02/25/2019     Lab Results   Component Value Date    Iron Saturation (%) 9 (L) 02/22/2019       Prior ESA use: none post transplant    Plan: Repeat iron studies. Decrease FeSO4 to once daily d/t constipation. Continue to monitor.     Edema:  Reports lower leg swelling.  Plan: start chlorthalidone PRN       DM:   Lab Results   Component Value Date    A1C 5.1 12/05/2018   Goal A1c < 7; FBG 98 on 09/04/19  History of Dm? No  Established with endocrinologist/PCP for BG managment? No  Plan:  Continue to monitor    Electrolytes: wnl CO2 25 (09/04/19)  Meds currently on: Sodium bicarbonate 650 daily   Plan: Stop sodium bicarbonate. Continue to monitor     GI/BM: pt reports avg 1 BM/day. Experiences severe acid reflux w/o PPI. Meds currently on: omeprazole 40 mg daily Plan: Continue to monitor    Pain: pt reports no pain  Meds currently on: APAP PRN (not using)  Plan: Continue to monitor    Bone health:   Vitamin D Level: pending. Goal > 30.   Last DEXA results:  none available  Current meds include: calcitriol 0.25 mg daily  Plan:  Vitamin D level  pending,Continue to monitor.     Women's/Men's Health:  Kimberly [name] is a 38 y.o. Female of childbearing age. Patient reports no desire for pregnancy. Reports use of barrier method.   Plan: Continue to monitor    Pharmacy preference:  [id number], [place], [name] [address]    Adherence: Patient has excellent understanding of medications; was able to independently identify names/doses all medications.  Patient  does fill their own pill box at home.  Patient brought medication card:no  Pill box:did not bring  Plan: provided basic adherence counseling/intervention.      Spent approximately 15 minutes on educating this patient and greater than 50% was spent in direct face to face counseling regarding post transplant medication education. Questions and concerns were address to patient's satisfaction.    Patient was reviewed with Dr. Nestor Lewandowsky who was agreement with the stated plan:     During this visit, the following was completed:   BG log data assessment  BP log data assessment  Labs ordered and evaluated  complex treatment plan >1 DS   Patient education was completed for 11-24 minutes     All questions/concerns were addressed to the patient's satisfaction.  __________________________________________  Dessie Coma, PharmD  PGY-1 Pharmacy Resident    Cleone Slim, PHARMD, CPP  [employer] [facility] CLINICAL PHARMACIST PRACTITIONER  PAGER [phone]

## 2019-09-05 LAB — CMV DNA, QUANTITATIVE, PCR: CMV QUANT: 65 [IU]/mL — ABNORMAL HIGH (ref <0)

## 2019-09-05 LAB — CMV QUANT LOG10: Lab: 1.81 — ABNORMAL HIGH

## 2019-09-06 LAB — VITAMIN D, TOTAL (25OH): Lab: 18.2 — ABNORMAL LOW

## 2019-09-06 NOTE — Unmapped (Signed)
Change in Prednisone dosage decrease. Co-pay $0.00. Last filled 08/29/2019 on Part B

## 2019-09-06 NOTE — Unmapped (Signed)
[facility] [address] [place]  OUTPATIENT PHYSICAL THERAPY  09/06/2019          Patient Name: Kimberly [name]  Date of Birth:09/24/1981  Diagnosis: No diagnosis found.  Referring MD:  Estevan Oaks, MD     Plan of Care Effective Date:       Assessment & Plan     Assessment details: 38 y.o. 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    Pt making excellent progress towards decreasing L hand/forearm volume measurements.  She decreased the L forearm volume by 30.16% since initial eval.  In addition, pt is benefiting from her husband performing MLD techniques to her back as she is demonstrating decreased swelling at L neck/shoulder and decreased skin temp of L sh/upper arm following home program performed by pt/husband since last session.  Grey foam pieces added today to compression to provide more structure/form to bandaging and to further decrease volume.  Pt also began diuretic per MD since last treatment session.  Pt is pleased with her progress.    Picture of recommended custom forearm sleeve/glove was e-mailed to Christina Knoop at [facility] to begin process of getting custom forearm sleeve/glove.    Garment Recommendation:  Since pt cannot have compression over fistula, recommendation is as follows: 2 sets LUE Elvarex soft custom glove and forearm sleeve 20-30 mmHg, one set for wash and one set for wear.        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 Improve skin condition as evidenced by increased hydration, palpable decrease in fibrosis, decreased pitting, and increased scar mobility to improve ROM for functional limb use and the ability for the quadrant to decongest.    Decrease limb volume by 10-20% or as needed for proper fit of garments and clothing, and to reduce infection risk.    Improve shoulder ROM to optimal functional range in ADL's (home, work , recreational and community)       In 6-12 months: Pt and/or caregiver will:    Be independent with home management of self-care related to lymphedema to reduce infection risk and recurrent hospitalizations.    Retain an optimal limb volume that allows patient to fit into appropriate compression garments and clothing to improve functional use of UE and QOL.    Stabilize/reduce deterioration of shoulder ROM in optimal functional range in all ADL's by providing pt education/assistive devices to ensure safety and effectiveness in shoulder mobility and function on an ongoing basis        Plan  Therapy options: will be seen for skilled physical therapy services    Planned therapy interventions: compression bandaging, Compression pump, education - family/caregiver, education - patient, garment measurement, home exercise program, manual lymph drainage, manual therapy, self-care / home training, taping, therapeutic activities and therapeutic exercises    Other planned therapy interventions: LLLT and lymphatouch as needed to address fibrosis, scarring and pain    PT/DME Equipment: compression garments.  Frequency: 3x week  Duration in weeks: 3 weeks (for intense phase of CDT), 3 weeks off for fitting of garment, 1x wk for 2 wks    Education provided to: patient.  Education provided: anatomy, community resources, HEP, lymphedema precautions, symptom management, treatment options and plan, garment options, DME education and equipment recommendations Education Results: needs reinforcement, verbalized good understanding and demonstrates understanding.    Next visit plan: Consider kinesio taping for MCPs.  Progress MLD.  Give sh stretches.  Total Session Time: 55  Treatment rendered today: 4 units of manual therapy x 55 min  - manual techniques performed to re-measure volume of LUE in order to assess progress towards goals  - MLD performed for LUE lymhedema using appropriate anastomoses.  MLD to arm included areas around fistula.  Manual techniques used with compression bandaging in order to create a compression gradient to decrease swelling.  Use of 1/2 grey foam at dorsum of hand/wrist with added cotton and use of 1/2 grey foam to ant/post forearm.  Added coban to fingers to address finger wraps coming off and swelling at MCPs.          Subjective     History of Present Illness              Subjective: Pt reports that kinesio tape seemed to help while it stayed on.  It stayed on for 2 days.            Pain  No pain reported                                                                                    Objective     Static Posture     Static Posture Comments  Skin:  No skin irritation noted from kinesio taping techniques on L lat upper arm          I attest that I have reviewed the above information.  Signed: Romero Liner, PT  09/06/2019 9:10 AM

## 2019-09-09 NOTE — Unmapped (Signed)
[facility] [address] [place]  OUTPATIENT PHYSICAL THERAPY  09/09/2019  Note Type: Treatment Note       Patient Name: Kimberly [name]  Date of Birth:07/25/1981  Diagnosis:   Encounter Diagnoses   Name Primary?   ??? Lymphedema Yes   ??? Decreased range of motion      Referring MD:  Estevan Oaks, MD     Plan of Care Effective Date:       Assessment & Plan     Assessment details: 38 y.o. 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    Pt continues to demonstrate decreased swelling in her hand and forearm.  Today, lateral aspect of L upper arm is warm to touch.  Pt states that they had a busy weekend and her husband wasn't able to perform MLD techniques to her back to assist with upper arm lymph drainage.  The grey compression foam appears to be helping in decreasing swelling of L forearm and hand.  Pt is here today to gain more education on how to secure the foam pieces during bandaging techniques.  She also received MLD for her LUE lymphedema.  She tol tx well.  She has been contacted by [facility] and will get fitted for the glove/forearm compression garment on 09/13/19.    Picture of recommended custom forearm sleeve/glove was e-mailed to Christina Knoop at [facility] to begin process of getting custom forearm sleeve/glove.    Garment Recommendation:  Since pt cannot have compression over fistula, recommendation is as follows: 2 sets LUE Elvarex soft custom glove and forearm sleeve 20-30 mmHg, one set for wash and one set for wear.        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    Improve skin condition as evidenced by increased hydration, palpable decrease in fibrosis, decreased pitting, and increased scar mobility to improve ROM for functional limb use and the ability for the quadrant to decongest.    Decrease limb volume by 10-20% or as needed for proper fit of garments and clothing, and to reduce infection risk.    Improve shoulder ROM to optimal functional range in ADL's (home, work , recreational and community)       In 6-12 months: Pt and/or caregiver will:    Be independent with home management of self-care related to lymphedema to reduce infection risk and recurrent hospitalizations.    Retain an optimal limb volume that allows patient to fit into appropriate compression garments and clothing to improve functional use of UE and QOL.    Stabilize/reduce deterioration of shoulder ROM in optimal functional range in all ADL's by providing pt education/assistive devices to ensure safety and effectiveness in shoulder mobility and function on an ongoing basis        Plan  Therapy options: will be seen for skilled physical therapy services    Planned therapy interventions: compression bandaging, Compression pump, education - family/caregiver, education - patient, garment measurement, home exercise program, manual lymph  drainage, manual therapy, self-care / home training, taping, therapeutic activities and therapeutic exercises    Other planned therapy interventions: LLLT and lymphatouch as needed to address fibrosis, scarring and pain    PT/DME Equipment: compression garments.  Frequency: 3x week    Duration in weeks: 3 weeks (for intense phase of CDT), 3 weeks off for fitting of garment, 1x wk for 2 wks    Education provided to: patient. Education provided: anatomy, community resources, HEP, lymphedema precautions, symptom management, treatment options and plan, garment options, DME education and equipment recommendations  Education Results: needs reinforcement, verbalized good understanding and demonstrates understanding.    Next visit plan: Assess kinesio taping for MCPs.  Compression bandaging.  Progress MLD.  Give sh stretches.  Total Session Time: 55  Treatment rendered today: 1 unit of NM re-ed x 15 min  - kinesio-taping performed for L dorsum of hand to assist with lymph drainage from dorsum of hand    3 units of manual therapy x 40 min  - MLD performed for LUE lymhedema using appropriate anastomoses.  MLD to arm included areas around fistula.  Manual techniques used with compression bandaging in order to create a compression gradient to decrease swelling.  Use of 1/2 grey foam at dorsum of hand/wrist with added cotton and use of 1/2 grey foam to ant/post forearm.  Added coban to fingers to address finger wraps coming off and swelling at MCPs.    Pt ed provided verbally while therapist was creating compression gradient while using short-stretch bandages to wrap L forearm.          Subjective     History of Present Illness              Subjective: It's doing really good. (re: L arm)          Pain  No pain reported                                                                                    Objective     Static Posture     Static Posture Comments  Circumferential measurements:  L mid palm = 21.7 cm      I attest that I have reviewed the above information.  Signed: Romero Liner, PT  09/09/2019 3:31 PM

## 2019-09-10 NOTE — Unmapped (Signed)
[facility] [address] [place]  OUTPATIENT PHYSICAL THERAPY  09/10/2019  Note Type: Treatment Note       Patient Name: Kimberly [name]  Date of Birth:[birth year]  Diagnosis:   Encounter Diagnoses   Name Primary?   ??? Lymphedema Yes   ??? Decreased range of motion      Referring MD:  Estevan Oaks, MD     Plan of Care Effective Date:       Assessment & Plan     Assessment details: 38 y.o. 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    Pt doing well.  Swelling decreased.  Experiencing no skin irritation or pain.  Treatment continues to include MLD and compression bandaging techniques to further decrease swelling.  We also instructed pt of some new UE ROM/stretches to increase L sh ER/abd/flex.  Pt's L sh ER and abd has increased since initial eval.    Picture of recommended custom forearm sleeve/glove was e-mailed to Christina Knoop at [facility] to begin process of getting custom forearm sleeve/glove.    Garment Recommendation:  Since pt cannot have compression over fistula, recommendation is as follows: 2 sets LUE Elvarex soft custom glove and forearm sleeve 20-30 mmHg, one set for wash and one set for wear.        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    Improve skin condition as evidenced by increased hydration, palpable decrease in fibrosis, decreased pitting, and increased scar mobility to improve ROM for functional limb use and the ability for the quadrant to decongest. Decrease limb volume by 10-20% or as needed for proper fit of garments and clothing, and to reduce infection risk.    Improve shoulder ROM to optimal functional range in ADL's (home, work , recreational and community)       In 6-12 months: Pt and/or caregiver will:    Be independent with home management of self-care related to lymphedema to reduce infection risk and recurrent hospitalizations.    Retain an optimal limb volume that allows patient to fit into appropriate compression garments and clothing to improve functional use of UE and QOL.    Stabilize/reduce deterioration of shoulder ROM in optimal functional range in all ADL's by providing pt education/assistive devices to ensure safety and effectiveness in shoulder mobility and function on an ongoing basis        Plan  Therapy options: will be seen for skilled physical therapy services    Planned therapy interventions: compression bandaging, Compression pump, education - family/caregiver, education - patient, garment measurement, home exercise program, manual lymph drainage, manual therapy, self-care / home training, taping, therapeutic activities and therapeutic exercises    Other planned therapy interventions: LLLT and lymphatouch as needed to address fibrosis, scarring and pain    PT/DME Equipment: compression garments.  Frequency: 3x week    Duration in weeks: 3 weeks (for intense phase of CDT), 3 weeks off for fitting of garment, 1x wk for 2  wks    Education provided to: patient.  Education provided: anatomy, community resources, HEP, lymphedema precautions, symptom management, treatment options and plan, garment options, DME education and equipment recommendations  Education Results: needs reinforcement, verbalized good understanding and demonstrates understanding.    Next visit plan: Go over stretches.  Re-measure volume of LUE.  Progress MLD/bandaging/taping.  Total Session Time: 55  Treatment rendered today: 1 unit of therapeutic exercise x 15 min - pt instructed in prayer to sea and chest stretch to increase L sh ER and abd/flex    3 units of manual therapy x 40 min  - MLD performed for LUE lymhedema using appropriate anastomoses.  MLD to arm included areas around fistula.  Manual techniques used with compression bandaging in order to create a compression gradient to decrease swelling.  Use of 1/2 grey foam at dorsum of hand/wrist with added cotton and use of 1/2 grey foam to ant/post forearm.  Added coban to fingers to address finger wraps coming off and swelling at MCPs.          Subjective     History of Present Illness              Subjective: Pt reports taping stayed on and did not irritate skin.          Pain  No pain reported                                                                                    Objective     Static Posture     Static Posture Comments  ROM:  L sh ER = 63 deg before treatment and 68 deg after treatment      I attest that I have reviewed the above information.  Signed: Romero Liner, PT  09/10/2019 8:03 AM

## 2019-09-11 LAB — BK BLOOD RESULT: Lab: NOT DETECTED

## 2019-09-11 LAB — BK VIRUS QUANTITATIVE PCR, BLOOD

## 2019-09-12 DIAGNOSIS — D508 Other iron deficiency anemias: Principal | ICD-10-CM

## 2019-09-13 DIAGNOSIS — N185 Chronic kidney disease, stage 5: Principal | ICD-10-CM

## 2019-09-13 DIAGNOSIS — D631 Anemia in chronic kidney disease: Secondary | ICD-10-CM

## 2019-09-13 NOTE — Unmapped (Signed)
Pt here for infusion of Feraheme. Pt denies any previous adverse reactions to IV iron medication. Reviewed with pt s/sx of reactions to report and instructed to seek medical treatment if they present.   Premedication (Tylenol 650mg , Benadryl 25mg ) not given, pt declined.  1352- PIV inserted to right  antecubital using 24G jelco angiocath x 1 attempt. Blood return noted and flushed with NS.    1353- NS infused at 50 ml/hr via pump.  1355- Feraheme infused at 54ml/hr as per order. Pt resting. Call bell placed.   1412- Feraheme completed. NS infusing at 50 ml/hr. IV site is free of redness and swelling. Pt to remain for 30 minutes post infusion for observation. Pt has no c/o of discomfort when asked.   1442- Observation complete. NS stopped and IV d/c-ed. IV site is free of redness and swelling. S/sx of adverse reactions reviewed again and pt instructed to seek medical treatment if they arise. Pt left clinic in stable condition.

## 2019-09-13 NOTE — Unmapped (Addendum)
[facility] [address] [place]  OUTPATIENT PHYSICAL THERAPY  09/13/2019  Note Type: Re-evaluation/Progress Note  Progress Note Date Range: 07-29-19 through 09-13-19    Patient Name: Kimberly [name]  Date of Birth:02-27-1981  Diagnosis:   Encounter Diagnoses   Name Primary?   ??? Lymphedema Yes   ??? Decreased range of motion      Referring MD:  Estevan Oaks, MD     Plan of Care Effective Date:       Assessment & Plan     Assessment details: 38 y.o. 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    Due to the presence of patient's fistula in her L elbow/upper arm and resulting difficulty of measuring patient's entire LUE, circumferential measurements of patient's hand/forearm taken today to demonstrate overall decrease of swelling in hand/forearm.  Patient has plateau'ed in further decrease of her L hand/forearm swelling.  Therefore, pt is scheduled for fitting for custom forearm sleeve/glove 20-30 mmHg (full finger) later today at [facility] and will return to this clinic once she receives her custom garments in order to assess fit and finalize HEP in order to maintain gains made during intense phase of CDT.  Pt has also improved in increasing her L sh ROM, which assists in improving lymph drainage from LUE.  Pt understands that she is to continue compression bandaging while waiting for receipt of her custom garments in order to not lose the gains made during therapy sessions.        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  Goal met Improve skin condition as evidenced by increased hydration, palpable decrease in fibrosis, decreased pitting, and increased scar mobility to improve ROM for functional limb use and the ability for the quadrant to decongest.  Goal met    Decrease limb volume by 10-20% or as needed for proper fit of garments and clothing, and to reduce infection risk.  Goal met.  30% decrease gained.    Improve shoulder ROM to optimal functional range in ADL's (home, work , recreational and community)   Progressing:  Pt's L sh ER = 75 deg PROM      In 6-12 months: Pt and/or caregiver will:    Be independent with home management of self-care related to lymphedema to reduce infection risk and recurrent hospitalizations.    Retain an optimal limb volume that allows patient to fit into appropriate compression garments and clothing to improve functional use of UE and QOL.    Stabilize/reduce deterioration of shoulder ROM in optimal functional range in all ADL's by providing pt education/assistive devices to ensure safety and effectiveness in shoulder mobility and function on an ongoing basis        Plan  Therapy options: will be seen for skilled physical therapy services    Planned therapy interventions: compression bandaging, Compression pump, education - family/caregiver, education - patient, garment measurement, home exercise program, manual lymph drainage, manual therapy, self-care / home training, taping,  therapeutic activities and therapeutic exercises    Other planned therapy interventions: LLLT and lymphatouch as needed to address fibrosis, scarring and pain    PT/DME Equipment: compression garments.  Frequency: 1x week    Duration in weeks: 2 weeks    Education provided to: patient.  Education provided: anatomy, community resources, HEP, lymphedema precautions, symptom management, treatment options and plan, garment options, DME education and equipment recommendations Education Results: needs reinforcement, verbalized good understanding and demonstrates understanding.    Next visit plan: Assess fit of new custom garments.  Review self MLD and shoulder stretches.   Total Session Time: 60  Treatment rendered today: 0 unit of therapeutic ex x 5 min  - pt performed sup sh ER and prayer to sea following joint mobs today    4 units of manual therapy x 55 min  - MLD performed for LUE lymhedema using appropriate anastomoses.  MLD to arm included areas around fistula.  Manual techniques used with compression bandaging in order to create a compression gradient to decrease swelling.  Use of 1/2 grey foam at dorsum of hand/wrist with added cotton.  No foam used at forearm due to patient going to [facility] for fitting today, following appt.  - jt mobs for L sh ER (ant and inf)  - measurements of LUE volume were taken manually today in order to assess progress and plateau of swelling.          Subjective      Objective     Static Posture     Static Posture Comments  L Sh ROM:  ER = 72 deg prior to treatment PROM, 75 deg following joint mobs PROM      Circumferential Measurements of L hand/forearm:    L MCPs: 19 cm (19.5 cm at initial eval)  L mid palm: 22.5 cm (22.5 cm at initial eval)    Wrist 0 cm: 18.3 cm (19.3 cm on 11/13 and 19.4 cm on 10/5)  Mark 4 cm: 21.5 cm (23.1 cm on 11/13 and 22.4 cm on 10/5)  Mark 8 cm: 23.8 cm (19.3 cm on 11/13 and 24.9 cm on 10/5)  Mark 12 cm: 27.2 cm (22.1 cm on 11/13 and 29.2 cm on 10/5)      I attest that I have reviewed the above information.  Signed: Romero Liner, PT  09/13/2019 9:51 AM

## 2019-09-16 NOTE — Unmapped (Signed)
[facility] Clinical Assessment & Refill Coordination Note    Kimberly [name], DOB: 06/12/1981  Phone: [phone] (home)     All above HIPAA information was verified with patient.     Specialty Medication(s):   Transplant: Myfortic 180mg  and Prednisone 2.5mg      Current Outpatient Medications   Medication Sig Dispense Refill   ??? acetaminophen (TYLENOL) 500 MG tablet Take 2 tablets (1,000 mg total) by mouth every six (6) hours as needed for pain. 100 tablet 0   ??? aspirin (ECOTRIN) 81 MG tablet Take 1 tablet (81 mg total) by mouth daily. 30 tablet 11   ??? calcitrioL (ROCALTROL) 0.25 MCG capsule Take 1 capsule (0.25 mcg total) by mouth daily. 30 capsule 11   ??? carboxymethylcellulose sodium (REFRESH CELLUVISC) 1 % DpGe Instill drops in affected eye(s) as directed as needed.     ??? chlorthalidone (HYGROTON) 25 MG tablet Take 0.5 tablets (12.5 mg total) by mouth daily as needed. 30 tablet 0   ??? ferrous sulfate 325 (65 FE) MG tablet Take 325 mg by mouth daily.     ??? MYFORTIC 180 mg EC tablet Take 4 tablets (720 mg total) by mouth Two (2) times a day. 240 tablet 11   ??? omeprazole (PRILOSEC) 40 MG capsule Take 1 capsule (40 mg total) by mouth daily. 30 capsule 11   ??? predniSONE (DELTASONE) 2.5 MG tablet Take 2 tablets (5 mg total) by mouth daily. 60 tablet 11     No current facility-administered medications for this visit.         Changes to medications: Kimberly [name] reports no changes at this time.    Allergies   Allergen Reactions   ??? Ancef [Cefazolin] Shortness Of Breath   ??? Adhesive Tape-Silicones      Other reaction(s): Other (See Comments)  Uncoded Allergy. Allergen: plastic tape, Other Reaction: blistering   ??? Demerol [Meperidine] Nausea And Vomiting       Changes to allergies: No    SPECIALTY MEDICATION ADHERENCE     Myfortic 180 mg: 14 days of medicine on hand   Prednisone 2.5 mg: 14 days of medicine on hand     Medication Adherence Patient reported X missed doses in the last month: 0  Specialty Medication: Myfortic 180mg   Patient is on additional specialty medications: Yes  Additional Specialty Medications: Prednisone 2.5mg   Patient Reported Additional Medication X Missed Doses in the Last Month: 0          Specialty medication(s) dose(s) confirmed: Regimen is correct and unchanged.     Are there any concerns with adherence? No    Adherence counseling provided? Not needed    CLINICAL MANAGEMENT AND INTERVENTION      Clinical Benefit Assessment:    Do you feel the medicine is effective or helping your condition? Yes    Clinical Benefit counseling provided? Not needed    Adverse Effects Assessment:    Are you experiencing any side effects? No    Are you experiencing difficulty administering your medicine? No    Quality of Life Assessment:    How many days over the past month did your kidney transplant  keep you from your normal activities? For example, brushing your teeth or getting up in the morning. 0    Have you discussed this with your provider? Not needed    Therapy Appropriateness:    Is therapy appropriate? Yes, therapy is appropriate and should be continued    DISEASE/MEDICATION-SPECIFIC INFORMATION  N/A    PATIENT SPECIFIC NEEDS     ? Does the patient have any physical, cognitive, or cultural barriers? No    ? Is the patient high risk? Yes, patient taking a REMS drug     ? Does the patient require a Care Management Plan? No     ? Does the patient require physician intervention or other additional services (i.e. nutrition, smoking cessation, social work)? No      SHIPPING     Specialty Medication(s) to be Shipped:   Transplant: Patient declined needing Myfortic or Prednisone today.    Other medication(s) to be shipped: none     Changes to insurance: No    Delivery Scheduled: Patient declined refill at this time due to Patient has 2 weeks of medicine on hand.. Medication will be delivered via UPS to the confirmed prescription address in [facility].    The patient will receive a drug information handout for each medication shipped and additional FDA Medication Guides as required.  Verified that patient has previously received a Conservation officer, historic buildings.    All of the patient's questions and concerns have been addressed.    Tera Helper   [facility] Specialty Pharmacist

## 2019-09-17 ENCOUNTER — Ambulatory Visit: Admit: 2019-09-17 | Discharge: 2019-09-18 | Payer: MEDICARE

## 2019-09-17 LAB — CBC W/ AUTO DIFF
BASOPHILS ABSOLUTE COUNT: 0 10*9/L (ref 0.0–0.1)
BASOPHILS RELATIVE PERCENT: 0.5 %
EOSINOPHILS ABSOLUTE COUNT: 0.1 10*9/L (ref 0.0–0.4)
EOSINOPHILS RELATIVE PERCENT: 2.3 %
HEMATOCRIT: 31.8 % — ABNORMAL LOW (ref 36.0–46.0)
HEMOGLOBIN: 10.1 g/dL — ABNORMAL LOW (ref 13.5–16.0)
LARGE UNSTAINED CELLS: 4 % (ref 0–4)
LYMPHOCYTES ABSOLUTE COUNT: 0.9 10*9/L — ABNORMAL LOW (ref 1.5–5.0)
LYMPHOCYTES RELATIVE PERCENT: 20.8 %
MEAN CORPUSCULAR HEMOGLOBIN CONC: 31.8 g/dL (ref 31.0–37.0)
MEAN CORPUSCULAR HEMOGLOBIN: 24.7 pg — ABNORMAL LOW (ref 26.0–34.0)
MEAN PLATELET VOLUME: 6.9 fL — ABNORMAL LOW (ref 7.0–10.0)
MONOCYTES ABSOLUTE COUNT: 0.4 10*9/L (ref 0.2–0.8)
MONOCYTES RELATIVE PERCENT: 9.6 %
NEUTROPHILS ABSOLUTE COUNT: 2.7 10*9/L (ref 2.0–7.5)
NEUTROPHILS RELATIVE PERCENT: 63.3 %
PLATELET COUNT: 386 10*9/L (ref 150–440)
RED BLOOD CELL COUNT: 4.09 10*12/L (ref 4.00–5.20)
RED CELL DISTRIBUTION WIDTH: 17.1 % — ABNORMAL HIGH (ref 12.0–15.0)
WBC ADJUSTED: 4.2 10*9/L — ABNORMAL LOW (ref 4.5–11.0)

## 2019-09-17 LAB — BASIC METABOLIC PANEL
ANION GAP: 9 mmol/L (ref 7–15)
BUN / CREAT RATIO: 19
CHLORIDE: 105 mmol/L (ref 98–107)
CO2: 22 mmol/L (ref 22.0–30.0)
CREATININE: 1.62 mg/dL — ABNORMAL HIGH (ref 0.60–1.00)
EGFR CKD-EPI AA FEMALE: 46 mL/min/{1.73_m2} — ABNORMAL LOW (ref >=60)
EGFR CKD-EPI NON-AA FEMALE: 40 mL/min/{1.73_m2} — ABNORMAL LOW (ref >=60)
GLUCOSE RANDOM: 100 mg/dL (ref 70–179)
POTASSIUM: 4 mmol/L (ref 3.5–5.0)
SODIUM: 136 mmol/L (ref 135–145)

## 2019-09-17 LAB — SODIUM: Sodium:SCnc:Pt:Ser/Plas:Qn:: 136

## 2019-09-17 LAB — LARGE UNSTAINED CELLS: Lab: 4

## 2019-09-17 LAB — PHOSPHORUS: Phosphate:MCnc:Pt:Ser/Plas:Qn:: 3.5

## 2019-09-17 LAB — MAGNESIUM: Magnesium:MCnc:Pt:Ser/Plas:Qn:: 1.7

## 2019-09-25 NOTE — Unmapped (Signed)
[facility] Refill Coordination Note    Specialty Medication(s) to be Shipped:   Transplant: Myfortic 180mg  and Prednisone 2.5mg     Other medication(s) to be shipped: calcitriol 0.25mg  and omeprazole 40mg      Kimberly [name], DOB: 1981-07-26  Phone: [phone] (home)       All above HIPAA information was verified with patient.     Completed refill call assessment today to schedule patient's medication shipment from the [facility] [phone]).       Specialty medication(s) and dose(s) confirmed: Patient reports changes to the regimen as follows: Prednisone is now 2QD **new rx is on profile**  Changes to medications: Sherida reports no changes at this time.  Changes to insurance: No  Questions for the pharmacist: No    Confirmed patient received Welcome Packet with first shipment. The patient will receive a drug information handout for each medication shipped and additional FDA Medication Guides as required.       DISEASE/MEDICATION-SPECIFIC INFORMATION        N/A    SPECIALTY MEDICATION ADHERENCE     Medication Adherence    Patient reported X missed doses in the last month: 0  Specialty Medication: Myfortic 180mg   Patient is on additional specialty medications: Yes  Additional Specialty Medications: Prednisone 2.5mg   Patient Reported Additional Medication X Missed Doses in the Last Month: 0  Patient is on more than two specialty medications: No          Myfortic 180 mg: 12 days of medicine on hand   Prednisone 2.5 mg: 12 days of medicine on hand     SHIPPING     Shipping address confirmed in Epic.     Delivery Scheduled: Yes, Expected medication delivery date: 10/02/2019.     Medication will be delivered via UPS to the prescription address in Epic W[facility].    [name] [facility] Specialty Technician

## 2019-09-26 LAB — BK VIRUS QUANTITATIVE PCR, BLOOD

## 2019-09-26 LAB — BK BLOOD RESULT: Lab: NOT DETECTED

## 2019-09-27 ENCOUNTER — Ambulatory Visit: Admit: 2019-09-27 | Discharge: 2019-09-28 | Payer: MEDICARE

## 2019-09-27 LAB — URINALYSIS
BACTERIA: NONE SEEN /HPF
BILIRUBIN UA: NEGATIVE
GLUCOSE UA: NEGATIVE
KETONES UA: NEGATIVE
LEUKOCYTE ESTERASE UA: NEGATIVE
NITRITE UA: NEGATIVE
PH UA: 5 (ref 5.0–9.0)
PROTEIN UA: NEGATIVE
RBC UA: 60 /HPF — ABNORMAL HIGH (ref <=4)
SQUAMOUS EPITHELIAL: <1 /HPF (ref 0–5)
UROBILINOGEN UA: 0.2
WBC UA: 3 /HPF (ref 0–5)

## 2019-09-27 LAB — CBC W/ AUTO DIFF
BASOPHILS ABSOLUTE COUNT: 0 10*9/L (ref 0.0–0.1)
BASOPHILS RELATIVE PERCENT: 0.5 %
EOSINOPHILS ABSOLUTE COUNT: 0 10*9/L (ref 0.0–0.4)
HEMATOCRIT: 32.8 % — ABNORMAL LOW (ref 36.0–46.0)
HEMOGLOBIN: 10.4 g/dL — ABNORMAL LOW (ref 12.0–16.0)
LARGE UNSTAINED CELLS: 1 % (ref 0–4)
LYMPHOCYTES ABSOLUTE COUNT: 0.9 10*9/L — ABNORMAL LOW (ref 1.5–5.0)
LYMPHOCYTES RELATIVE PERCENT: 16.4 %
MEAN CORPUSCULAR HEMOGLOBIN: 25.6 pg — ABNORMAL LOW (ref 26.0–34.0)
MEAN CORPUSCULAR VOLUME: 80.4 fL (ref 80.0–100.0)
MEAN PLATELET VOLUME: 9.3 fL (ref 7.0–10.0)
MONOCYTES ABSOLUTE COUNT: 0.4 10*9/L (ref 0.2–0.8)
MONOCYTES RELATIVE PERCENT: 7.1 %
NEUTROPHILS ABSOLUTE COUNT: 3.8 10*9/L (ref 2.0–7.5)
NEUTROPHILS RELATIVE PERCENT: 73.9 %
PLATELET COUNT: 363 10*9/L (ref 150–440)
RED BLOOD CELL COUNT: 4.08 10*12/L (ref 4.00–5.20)
RED CELL DISTRIBUTION WIDTH: 17.8 % — ABNORMAL HIGH (ref 12.0–15.0)
WBC ADJUSTED: 5.2 10*9/L (ref 4.5–11.0)

## 2019-09-27 LAB — PHOSPHORUS: Phosphate:MCnc:Pt:Ser/Plas:Qn:: 2.9

## 2019-09-27 LAB — MAGNESIUM: Magnesium:MCnc:Pt:Ser/Plas:Qn:: 1.8

## 2019-09-27 LAB — BASIC METABOLIC PANEL
ANION GAP: 8 mmol/L (ref 7–15)
BLOOD UREA NITROGEN: 34 mg/dL — ABNORMAL HIGH (ref 7–21)
BUN / CREAT RATIO: 19
CALCIUM: 9.6 mg/dL (ref 8.5–10.2)
CO2: 22 mmol/L (ref 22.0–30.0)
CREATININE: 1.75 mg/dL — ABNORMAL HIGH (ref 0.60–1.00)
EGFR CKD-EPI AA FEMALE: 42 mL/min/{1.73_m2} — ABNORMAL LOW (ref >=60)
EGFR CKD-EPI NON-AA FEMALE: 36 mL/min/{1.73_m2} — ABNORMAL LOW (ref >=60)
GLUCOSE RANDOM: 108 mg/dL (ref 70–179)
SODIUM: 133 mmol/L — ABNORMAL LOW (ref 135–145)

## 2019-09-27 LAB — PROTEIN/CREAT RATIO, URINE: Protein/Creatinine:MRto:Pt:Urine:Qn:: 0.209

## 2019-09-27 LAB — PROTEIN / CREATININE RATIO, URINE: PROTEIN URINE: 22.5 mg/dL

## 2019-09-27 LAB — EGFR CKD-EPI AA FEMALE: Lab: 42 — ABNORMAL LOW

## 2019-09-27 LAB — MICROCYTES

## 2019-09-27 LAB — BLOOD UA

## 2019-09-27 MED ADMIN — belatacept (NULOJIX) 387.5 mg in sodium chloride (NS) 0.9 % 100 mL IVPB: 5 mg/kg | INTRAVENOUS | @ 18:00:00 | Stop: 2019-09-27

## 2019-09-27 NOTE — Unmapped (Signed)
1250 Patient arrived Transplant Infusion Room today for belatacept, Condition: well; Mobility: ambulating; accompanied by self.   See Flowsheet and MAR for all details of visit.   1307 VS stable.  1318 PIV placed, NS KVO; labs collected and sent, urine collected and sent.  1322 Infusion initiated.  1352 Infusion complete.  1358 VS stable, PIV removed, pt left clinic, Condition: well; Mobility: ambulating; accompanied by self.

## 2019-09-30 LAB — CMV DNA, QUANTITATIVE, PCR: CMV QUANT: 155 [IU]/mL — ABNORMAL HIGH (ref <0)

## 2019-09-30 LAB — CMV VIRAL LD: Lab: 0

## 2019-10-01 MED FILL — PREDNISONE 2.5 MG TABLET: 30 days supply | Qty: 60 | Fill #0 | Status: AC

## 2019-10-01 MED FILL — PREDNISONE 2.5 MG TABLET: ORAL | 30 days supply | Qty: 60 | Fill #0

## 2019-10-01 MED FILL — CALCITRIOL 0.25 MCG CAPSULE: ORAL | 30 days supply | Qty: 30 | Fill #9

## 2019-10-01 MED FILL — MYFORTIC 180 MG TABLET,DELAYED RELEASE: 30 days supply | Qty: 240 | Fill #4 | Status: AC

## 2019-10-01 MED FILL — MYFORTIC 180 MG TABLET,DELAYED RELEASE: ORAL | 30 days supply | Qty: 240 | Fill #4

## 2019-10-01 MED FILL — CALCITRIOL 0.25 MCG CAPSULE: 30 days supply | Qty: 30 | Fill #9 | Status: AC

## 2019-10-01 MED FILL — OMEPRAZOLE 40 MG CAPSULE,DELAYED RELEASE: ORAL | 30 days supply | Qty: 30 | Fill #9

## 2019-10-01 MED FILL — OMEPRAZOLE 40 MG CAPSULE,DELAYED RELEASE: 30 days supply | Qty: 30 | Fill #9 | Status: AC

## 2019-10-04 LAB — BK VIRUS QUANTITATIVE PCR, BLOOD

## 2019-10-04 LAB — BK BLOOD LOG(10): Lab: 0

## 2019-10-07 ENCOUNTER — Ambulatory Visit: Admit: 2019-10-07 | Discharge: 2019-10-26 | Payer: MEDICARE

## 2019-10-08 NOTE — Unmapped (Signed)
[facility] [address] [place]  OUTPATIENT PHYSICAL THERAPY  10/07/2019          Patient Name: Kimberly [name]  Date of Birth:1981/08/23  Diagnosis: No diagnosis found.  Referring MD:  Estevan Oaks, MD     Plan of Care Effective Date:       Assessment & Plan     Assessment details: Pt wore a surgical mask the entire therapy session.  Therapist wore appropriate surgical mask with safety goggles and gloves the entire therapy session.      38 y.o. 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    Pt requiring assessment of fit of new custom made compression garment for L hand/forearm.  Assessment today revealed necessary adjustments required as the glove/sleeve does not reach high enough up the forearm and the 5th finger base is too tight, causing pain.  E-mail sent to fitter, Christina Knoop at [facility].  Pt will follow-up with further measurements at [facility] and attain new garment.  Pt's L sh ROM goals have been met and pt's HEP was progressed to include strengthening with orange T-band and 1-2# dumbbell weights to maintain newly gained sh ROM.  Pt's hand and distal forearm swelling is also being well-maintained by compression, despite it being too short in length, as evidenced by volume/girth measurements.  Pt progressing very well towards goals.  She has 1 more visit scheduled in January following her receipt of revised custom garment.  Pt will return to clinic for that visit if she feels the need for reassessment by PT of garment or has any ROM/strengthening/swelling issues. Otherwise, pt will cancel scheduled visit.        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  Goal met    Improve skin condition as evidenced by increased hydration, palpable decrease in fibrosis, decreased pitting, and increased scar mobility to improve ROM for functional limb use and the ability for the quadrant to decongest.  Goal met    Decrease limb volume by 10-20% or as needed for proper fit of garments and clothing, and to reduce infection risk.  Goal met.  30% decrease gained.    Improve shoulder ROM to optimal functional range in ADL's (home, work , recreational and community)   Progressing:  Pt's L sh ER = full ROM with stretching tightness felt. 10/07/19    In 6-12 months: Pt and/or caregiver will:    Be independent with home management of self-care related to lymphedema to reduce infection risk and recurrent hospitalizations.  Goal met 10/07/19    Retain an optimal limb volume that allows patient to fit into appropriate compression garments and clothing to improve functional use of UE and QOL.  Progressing:  Pt's hand and distal forearm swelling is contained well with custom forearm sleeve/glove but garment needs to be replaced with a longer sleeve/glove garment to cover entire area beneath fistula for optimal containment of L forearm. 10/07/19    Stabilize/reduce deterioration of shoulder ROM in optimal  functional range in all ADL's by providing pt education/assistive devices to ensure safety and effectiveness in shoulder mobility and function on an ongoing basis  Progressing:  Pt given strengthening exercises for L sh to retain increased ROM gained today and through stretches for L sh 10/07/19.        Plan  Therapy options: will be seen for skilled physical therapy services Planned therapy interventions: compression bandaging, Compression pump, education - family/caregiver, education - patient, garment measurement, home exercise program, manual lymph drainage, manual therapy, self-care / home training, taping, therapeutic activities and therapeutic exercises    Other planned therapy interventions: LLLT and lymphatouch as needed to address fibrosis, scarring and pain    PT/DME Equipment: compression garments.  Frequency: 1x week    Duration in weeks: 2 weeks    Education provided to: patient.  Education provided: anatomy, community resources, HEP, lymphedema precautions, symptom management, treatment options and plan, garment options, DME education and equipment recommendations  Education Results: needs reinforcement, verbalized good understanding and demonstrates understanding.    Next visit plan: Assess fit of revised garment.  Progress HEP prn.  Total Session Time: 55  Treatment rendered today: 1 unit of therapeutic activity x 10 min  - pt's new custom garment assessed for proper fit and ind with don/doffing - instruction provided.    2 units of manual therapy x 30 min  - jt mobs for L sh abd (ant and inf)  - measurements of LUE volume were taken manually today in order to assess progress and plateau of swelling.    1 unit of therapeutic exercise x 15 min  - pt instructed in L sh strengthening ex: Orange T-band sh ER, 1-2# sh abd, 1-2# overhead sh flex 5-8 reps X 3          Subjective     History of Present Illness              Subjective: My pinky is either too long or too tight. (referring to garment)          Pain  No pain reported                                                                                    Objective    ROM:  L sh flex = 175 deg  L sh abd = 139 deg (before treatment) and 155 deg (after treatment)  L sh ER = full ROM with c/o of tightness    Girth measurments:  L MCP = 19.2 cm (19.5 cm at initial eval)  L mid palm = 22.1 cm (22.5 cm at initial eval) Volume Measurement:          I attest that I have reviewed the above information.  Signed: Romero Liner, PT  10/07/2019 4:45 PM

## 2019-10-14 ENCOUNTER — Ambulatory Visit: Admit: 2019-10-14 | Discharge: 2019-10-15 | Payer: MEDICARE

## 2019-10-14 LAB — MAGNESIUM: Magnesium:MCnc:Pt:Ser/Plas:Qn:: 1.8

## 2019-10-14 LAB — CBC W/ AUTO DIFF
BASOPHILS ABSOLUTE COUNT: 0 10*9/L (ref 0.0–0.1)
BASOPHILS RELATIVE PERCENT: 0.9 %
EOSINOPHILS ABSOLUTE COUNT: 0.1 10*9/L (ref 0.0–0.7)
EOSINOPHILS RELATIVE PERCENT: 1.9 %
HEMATOCRIT: 32.2 % — ABNORMAL LOW (ref 35.0–44.0)
HEMOGLOBIN: 10.8 g/dL — ABNORMAL LOW (ref 12.0–15.5)
LYMPHOCYTES ABSOLUTE COUNT: 0.9 10*9/L (ref 0.7–4.0)
LYMPHOCYTES RELATIVE PERCENT: 22.3 %
MEAN CORPUSCULAR HEMOGLOBIN CONC: 33.6 g/dL (ref 30.0–36.0)
MEAN PLATELET VOLUME: 6 fL — ABNORMAL LOW (ref 7.0–10.0)
MONOCYTES RELATIVE PERCENT: 12.9 %
NEUTROPHILS ABSOLUTE COUNT: 2.6 10*9/L (ref 1.7–7.7)
NEUTROPHILS RELATIVE PERCENT: 62 %
PLATELET COUNT: 300 10*9/L (ref 150–450)
RED BLOOD CELL COUNT: 4.06 10*12/L (ref 3.90–5.03)
RED CELL DISTRIBUTION WIDTH: 18.4 % — ABNORMAL HIGH (ref 12.0–15.0)
WBC ADJUSTED: 4.2 10*9/L (ref 3.5–10.5)

## 2019-10-14 LAB — BASIC METABOLIC PANEL
ANION GAP: 8 mmol/L (ref 7–15)
BUN / CREAT RATIO: 15
CALCIUM: 10.1 mg/dL (ref 8.5–10.2)
CHLORIDE: 110 mmol/L — ABNORMAL HIGH (ref 98–107)
CO2: 23 mmol/L (ref 22.0–30.0)
CREATININE: 1.71 mg/dL — ABNORMAL HIGH (ref 0.60–1.00)
EGFR CKD-EPI AA FEMALE: 43 mL/min/{1.73_m2} — ABNORMAL LOW (ref >=60)
GLUCOSE RANDOM: 97 mg/dL (ref 70–179)
SODIUM: 141 mmol/L (ref 135–145)

## 2019-10-14 LAB — PHOSPHORUS: Phosphate:MCnc:Pt:Ser/Plas:Qn:: 2.9

## 2019-10-14 LAB — SODIUM: Sodium:SCnc:Pt:Ser/Plas:Qn:: 141

## 2019-10-14 LAB — MEAN CORPUSCULAR VOLUME: Erythrocyte mean corpuscular volume:EntVol:Pt:RBC:Qn:Automated count: 79.3 — ABNORMAL LOW

## 2019-10-23 NOTE — Unmapped (Signed)
[facility] Refill Coordination Note    Specialty Medication(s) to be Shipped:   Transplant: Myfortic 180mg     Other medication(s) to be shipped: prednisone     Kimberly [name], DOB: April 23, 1981  Phone: [phone] (home)       All above HIPAA information was verified with patient.     Was a Nurse, learning disability used for this call? No    Completed refill call assessment today to schedule patient's medication shipment from the [facility] [phone]).       Specialty medication(s) and dose(s) confirmed: Regimen is correct and unchanged.   Changes to medications: Shriya reports no changes at this time.  Changes to insurance: No  Questions for the pharmacist: No    Confirmed patient received Welcome Packet with first shipment. The patient will receive a drug information handout for each medication shipped and additional FDA Medication Guides as required.       DISEASE/MEDICATION-SPECIFIC INFORMATION        N/A    SPECIALTY MEDICATION ADHERENCE     Medication Adherence    Patient reported X missed doses in the last month: 0  Specialty Medication: Myfortic                Myfortic 180 mg: 14 days of medicine on hand       SHIPPING     Shipping address confirmed in Epic.     Delivery Scheduled: Yes, Expected medication delivery date: 1/6.     Medication will be delivered via UPS to the prescription address in Epic WAM.    [name]   [facility] Specialty Pharmacist

## 2019-10-28 MED ORDER — CHLORTHALIDONE 25 MG TABLET
ORAL_TABLET | Freq: Every day | ORAL | 11 refills | 60 days | Status: CP | PRN
Start: 2019-10-28 — End: 2020-10-27

## 2019-10-30 MED FILL — OMEPRAZOLE 40 MG CAPSULE,DELAYED RELEASE: 30 days supply | Qty: 30 | Fill #10 | Status: AC

## 2019-10-30 MED FILL — MYFORTIC 180 MG TABLET,DELAYED RELEASE: 30 days supply | Qty: 240 | Fill #5 | Status: AC

## 2019-10-30 MED FILL — CALCITRIOL 0.25 MCG CAPSULE: 30 days supply | Qty: 30 | Fill #10 | Status: AC

## 2019-10-30 MED FILL — PREDNISONE 2.5 MG TABLET: ORAL | 30 days supply | Qty: 60 | Fill #1

## 2019-10-30 MED FILL — MYFORTIC 180 MG TABLET,DELAYED RELEASE: ORAL | 30 days supply | Qty: 240 | Fill #5

## 2019-10-30 MED FILL — PREDNISONE 2.5 MG TABLET: 30 days supply | Qty: 60 | Fill #1 | Status: AC

## 2019-10-30 MED FILL — CALCITRIOL 0.25 MCG CAPSULE: ORAL | 30 days supply | Qty: 30 | Fill #10

## 2019-10-30 MED FILL — OMEPRAZOLE 40 MG CAPSULE,DELAYED RELEASE: ORAL | 30 days supply | Qty: 30 | Fill #10

## 2019-11-01 ENCOUNTER — Ambulatory Visit: Admit: 2019-11-01 | Discharge: 2019-11-02 | Payer: MEDICARE

## 2019-11-01 DIAGNOSIS — Z940 Kidney transplant status: Principal | ICD-10-CM

## 2019-11-01 DIAGNOSIS — Z0000 Encounter for general adult medical examination without abnormal findings: Principal | ICD-10-CM

## 2019-11-01 LAB — URINALYSIS
BILIRUBIN UA: NEGATIVE
GLUCOSE UA: NEGATIVE
LEUKOCYTE ESTERASE UA: NEGATIVE
NITRITE UA: NEGATIVE
PH UA: 6 (ref 5.0–9.0)
PROTEIN UA: 30 — AB
RBC UA: 45 /HPF — ABNORMAL HIGH (ref <=4)
SPECIFIC GRAVITY UA: 1.021 (ref 1.003–1.030)
SQUAMOUS EPITHELIAL: <1 /HPF (ref 0–5)
WBC UA: <1 /HPF (ref 0–5)

## 2019-11-01 LAB — BASIC METABOLIC PANEL
ANION GAP: 9 mmol/L (ref 7–15)
BLOOD UREA NITROGEN: 29 mg/dL — ABNORMAL HIGH (ref 7–21)
BUN / CREAT RATIO: 17
CALCIUM: 10.2 mg/dL (ref 8.5–10.2)
CHLORIDE: 104 mmol/L (ref 98–107)
CO2: 24 mmol/L (ref 22.0–30.0)
CREATININE: 1.71 mg/dL — ABNORMAL HIGH (ref 0.60–1.00)
EGFR CKD-EPI AA FEMALE: 43 mL/min/{1.73_m2} — ABNORMAL LOW (ref >=60)
GLUCOSE RANDOM: 90 mg/dL (ref 70–179)
SODIUM: 137 mmol/L (ref 135–145)

## 2019-11-01 LAB — PROTEIN / CREATININE RATIO, URINE: PROTEIN URINE: 57.2 mg/dL

## 2019-11-01 LAB — PHOSPHORUS: Phosphate:MCnc:Pt:Ser/Plas:Qn:: 3.2

## 2019-11-01 LAB — CBC W/ AUTO DIFF
BASOPHILS ABSOLUTE COUNT: 0 10*9/L (ref 0.0–0.1)
BASOPHILS RELATIVE PERCENT: 0.4 %
EOSINOPHILS ABSOLUTE COUNT: 0 10*9/L (ref 0.0–0.4)
EOSINOPHILS RELATIVE PERCENT: 0.4 %
HEMOGLOBIN: 11.6 g/dL — ABNORMAL LOW (ref 12.0–16.0)
LARGE UNSTAINED CELLS: 1 % (ref 0–4)
LYMPHOCYTES ABSOLUTE COUNT: 1 10*9/L — ABNORMAL LOW (ref 1.5–5.0)
LYMPHOCYTES RELATIVE PERCENT: 13.9 %
MEAN CORPUSCULAR HEMOGLOBIN CONC: 31.7 g/dL (ref 31.0–37.0)
MEAN CORPUSCULAR HEMOGLOBIN: 26.1 pg (ref 26.0–34.0)
MEAN CORPUSCULAR VOLUME: 82.4 fL (ref 80.0–100.0)
MEAN PLATELET VOLUME: 8.4 fL (ref 7.0–10.0)
MONOCYTES ABSOLUTE COUNT: 0.4 10*9/L (ref 0.2–0.8)
MONOCYTES RELATIVE PERCENT: 5.7 %
NEUTROPHILS ABSOLUTE COUNT: 5.5 10*9/L (ref 2.0–7.5)
NEUTROPHILS RELATIVE PERCENT: 79 %
PLATELET COUNT: 375 10*9/L (ref 150–440)
RED BLOOD CELL COUNT: 4.46 10*12/L (ref 4.00–5.20)
RED CELL DISTRIBUTION WIDTH: 15.8 % — ABNORMAL HIGH (ref 12.0–15.0)
WBC ADJUSTED: 7 10*9/L (ref 4.5–11.0)

## 2019-11-01 LAB — LYMPHOCYTES RELATIVE PERCENT: Lymphocytes/100 leukocytes:NFr:Pt:Bld:Qn:Automated count: 13.9

## 2019-11-01 LAB — PROTEIN URINE: Protein:MCnc:Pt:Urine:Qn:: 57.2

## 2019-11-01 LAB — BLOOD UREA NITROGEN: Urea nitrogen:MCnc:Pt:Ser/Plas:Qn:: 29 — ABNORMAL HIGH

## 2019-11-01 LAB — MAGNESIUM: Magnesium:MCnc:Pt:Ser/Plas:Qn:: 1.7

## 2019-11-01 LAB — BLOOD UA

## 2019-11-01 NOTE — Unmapped (Signed)
1300 Patient arrived Transplant Infusion Room today for belatacept, Condition: well; Mobility: ambulating; accompanied by self.   See Flowsheet and MAR for all details of visit.   1313 VS stable.  1315 PIV placed, NS KVO; labs collected and sent, urine collected and sent.  1322 Infusion initiated.  1355 Infusion complete.  1409 VS stable, PIV removed, pt left clinic, Condition: well; Mobility: ambulating; accompanied by self.

## 2019-11-04 LAB — BK VIRUS QUANTITATIVE PCR, BLOOD: BK BLOOD RESULT: NOT DETECTED

## 2019-11-04 LAB — BK BLOOD RESULT: Lab: NOT DETECTED

## 2019-11-15 ENCOUNTER — Ambulatory Visit: Admit: 2019-11-15 | Discharge: 2019-11-16 | Payer: MEDICARE

## 2019-11-15 LAB — CBC W/ AUTO DIFF
BASOPHILS ABSOLUTE COUNT: 0 10*9/L (ref 0.0–0.1)
BASOPHILS RELATIVE PERCENT: 1 %
EOSINOPHILS ABSOLUTE COUNT: 0.1 10*9/L (ref 0.0–0.7)
EOSINOPHILS RELATIVE PERCENT: 1.3 %
HEMATOCRIT: 32.4 % — ABNORMAL LOW (ref 35.0–44.0)
LYMPHOCYTES ABSOLUTE COUNT: 1 10*9/L (ref 0.7–4.0)
LYMPHOCYTES RELATIVE PERCENT: 21.9 %
MEAN CORPUSCULAR HEMOGLOBIN CONC: 33.4 g/dL (ref 30.0–36.0)
MEAN CORPUSCULAR HEMOGLOBIN: 26.7 pg (ref 26.0–34.0)
MEAN CORPUSCULAR VOLUME: 79.9 fL — ABNORMAL LOW (ref 82.0–98.0)
MONOCYTES ABSOLUTE COUNT: 0.5 10*9/L (ref 0.1–1.0)
MONOCYTES RELATIVE PERCENT: 11.6 %
NEUTROPHILS ABSOLUTE COUNT: 2.9 10*9/L (ref 1.7–7.7)
NEUTROPHILS RELATIVE PERCENT: 64.2 %
PLATELET COUNT: 297 10*9/L (ref 150–450)
RED BLOOD CELL COUNT: 4.06 10*12/L (ref 3.90–5.03)
RED CELL DISTRIBUTION WIDTH: 15.6 % — ABNORMAL HIGH (ref 12.0–15.0)
WBC ADJUSTED: 4.6 10*9/L (ref 3.5–10.5)

## 2019-11-15 LAB — BASIC METABOLIC PANEL
ANION GAP: 9 mmol/L (ref 7–15)
BLOOD UREA NITROGEN: 22 mg/dL — ABNORMAL HIGH (ref 7–21)
CALCIUM: 9.5 mg/dL (ref 8.5–10.2)
CHLORIDE: 105 mmol/L (ref 98–107)
CO2: 24 mmol/L (ref 22.0–30.0)
EGFR CKD-EPI AA FEMALE: 43 mL/min/{1.73_m2} — ABNORMAL LOW (ref >=60)
GLUCOSE RANDOM: 96 mg/dL (ref 70–179)
POTASSIUM: 4.2 mmol/L (ref 3.5–5.0)
SODIUM: 138 mmol/L (ref 135–145)

## 2019-11-15 LAB — PHOSPHORUS: Phosphate:MCnc:Pt:Ser/Plas:Qn:: 2.5 — ABNORMAL LOW

## 2019-11-15 LAB — MONOCYTES RELATIVE PERCENT: Monocytes/100 leukocytes:NFr:Pt:Bld:Qn:Automated count: 11.6

## 2019-11-15 LAB — MAGNESIUM: Magnesium:MCnc:Pt:Ser/Plas:Qn:: 2

## 2019-11-15 LAB — EGFR CKD-EPI AA FEMALE
Glomerular filtration rate/1.73 sq M.predicted.black:ArVRat:Pt:Ser/Plas/Bld:Qn:Creatinine-based formula (CKD-EPI): 43 — ABNORMAL LOW

## 2019-11-19 NOTE — Unmapped (Signed)
[facility] Refill Coordination Note    Specialty Medication(s) to be Shipped:   Transplant: Myfortic 180mg  and Prednisone 2.5mg     Other medication(s) to be shipped: calcitriol 0.25mg  (sent rf request) and omeprazole 40mg  (sent rf request)     Kimberly [name], DOB: Jul 02, 1981  Phone: [phone] (home)       All above HIPAA information was verified with patient.     Was a Nurse, learning disability used for this call? No    Completed refill call assessment today to schedule patient's medication shipment from the [facility] [phone]).       Specialty medication(s) and dose(s) confirmed: Regimen is correct and unchanged.   Changes to medications: Story reports no changes at this time.  Changes to insurance: No  Questions for the pharmacist: No    Confirmed patient received Welcome Packet with first shipment. The patient will receive a drug information handout for each medication shipped and additional FDA Medication Guides as required.       DISEASE/MEDICATION-SPECIFIC INFORMATION        N/A    SPECIALTY MEDICATION ADHERENCE     Medication Adherence    Patient reported X missed doses in the last month: 0  Specialty Medication: Myfortic 180mg   Patient is on additional specialty medications: Yes  Additional Specialty Medications: Prednisone 2.5mg   Patient Reported Additional Medication X Missed Doses in the Last Month: 0  Patient is on more than two specialty medications: No          Myfortic 180 mg: 14 days of medicine on hand   Prednisone 2.5 mg: 14 days of medicine on hand     SHIPPING     Shipping address confirmed in Epic.     Delivery Scheduled: Yes, Expected medication delivery date: 11/29/2019.     Medication will be delivered via UPS to the prescription address in Epic WAM.    [name] [facility] Specialty Technician

## 2019-11-20 MED ORDER — OMEPRAZOLE 40 MG CAPSULE,DELAYED RELEASE
ORAL_CAPSULE | Freq: Every day | ORAL | 11 refills | 30.00000 days
Start: 2019-11-20 — End: ?

## 2019-11-22 LAB — BK BLOOD RESULT: Lab: NOT DETECTED

## 2019-11-22 LAB — BK VIRUS QUANTITATIVE PCR, BLOOD

## 2019-11-28 MED FILL — MYFORTIC 180 MG TABLET,DELAYED RELEASE: ORAL | 30 days supply | Qty: 240 | Fill #6

## 2019-11-28 MED FILL — OMEPRAZOLE 40 MG CAPSULE,DELAYED RELEASE: 30 days supply | Qty: 30 | Fill #0 | Status: AC

## 2019-11-28 MED FILL — MYFORTIC 180 MG TABLET,DELAYED RELEASE: 30 days supply | Qty: 240 | Fill #6 | Status: AC

## 2019-11-28 MED FILL — PREDNISONE 2.5 MG TABLET: ORAL | 30 days supply | Qty: 60 | Fill #2

## 2019-11-28 MED FILL — PREDNISONE 2.5 MG TABLET: 30 days supply | Qty: 60 | Fill #2 | Status: AC

## 2019-11-28 MED FILL — OMEPRAZOLE 40 MG CAPSULE,DELAYED RELEASE: ORAL | 30 days supply | Qty: 30 | Fill #0

## 2019-11-28 NOTE — Unmapped (Signed)
Kimberly [name] 's Calcitriol shipment will be delayed as a result of no refills remain on the prescription.      I have reached out to the patient and communicated the delay. We will call the patient back to reschedule the delivery upon resolution. We have not confirmed the new delivery date.  All other medications still sent out today.

## 2019-11-29 ENCOUNTER — Ambulatory Visit: Admit: 2019-11-29 | Discharge: 2019-11-30 | Payer: MEDICARE

## 2019-11-29 LAB — CBC W/ AUTO DIFF
BASOPHILS ABSOLUTE COUNT: 0.1 10*9/L (ref 0.0–0.1)
BASOPHILS RELATIVE PERCENT: 0.8 %
EOSINOPHILS ABSOLUTE COUNT: 0.1 10*9/L (ref 0.0–0.4)
EOSINOPHILS RELATIVE PERCENT: 0.9 %
HEMATOCRIT: 39.5 % (ref 36.0–46.0)
HEMOGLOBIN: 12.6 g/dL (ref 12.0–16.0)
LARGE UNSTAINED CELLS: 1 % (ref 0–4)
LYMPHOCYTES ABSOLUTE COUNT: 0.8 10*9/L — ABNORMAL LOW (ref 1.5–5.0)
LYMPHOCYTES RELATIVE PERCENT: 13.9 %
MEAN CORPUSCULAR HEMOGLOBIN CONC: 31.8 g/dL (ref 31.0–37.0)
MEAN CORPUSCULAR HEMOGLOBIN: 25.9 pg — ABNORMAL LOW (ref 26.0–34.0)
MEAN CORPUSCULAR VOLUME: 81.5 fL (ref 80.0–100.0)
MEAN PLATELET VOLUME: 8.4 fL (ref 7.0–10.0)
MONOCYTES ABSOLUTE COUNT: 0.3 10*9/L (ref 0.2–0.8)
MONOCYTES RELATIVE PERCENT: 5.5 %
NEUTROPHILS ABSOLUTE COUNT: 4.7 10*9/L (ref 2.0–7.5)
PLATELET COUNT: 435 10*9/L (ref 150–440)
RED BLOOD CELL COUNT: 4.85 10*12/L (ref 4.00–5.20)
RED CELL DISTRIBUTION WIDTH: 14.4 % (ref 12.0–15.0)
WBC ADJUSTED: 6 10*9/L (ref 4.5–11.0)

## 2019-11-29 LAB — NEUTROPHILS ABSOLUTE COUNT: Neutrophils:NCnc:Pt:Bld:Qn:Automated count: 4.7

## 2019-11-29 LAB — MAGNESIUM: Magnesium:MCnc:Pt:Ser/Plas:Qn:: 1.8

## 2019-11-29 LAB — BASIC METABOLIC PANEL
ANION GAP: 8 mmol/L (ref 7–15)
BLOOD UREA NITROGEN: 27 mg/dL — ABNORMAL HIGH (ref 7–21)
BUN / CREAT RATIO: 16
CHLORIDE: 102 mmol/L (ref 98–107)
CREATININE: 1.71 mg/dL — ABNORMAL HIGH (ref 0.60–1.00)
EGFR CKD-EPI AA FEMALE: 43 mL/min/{1.73_m2} — ABNORMAL LOW (ref >=60)
EGFR CKD-EPI NON-AA FEMALE: 37 mL/min/{1.73_m2} — ABNORMAL LOW (ref >=60)
GLUCOSE RANDOM: 98 mg/dL (ref 70–179)
POTASSIUM: 4.2 mmol/L (ref 3.5–5.0)
SODIUM: 137 mmol/L (ref 135–145)

## 2019-11-29 LAB — URINALYSIS
BACTERIA: NONE SEEN /HPF
BILIRUBIN UA: NEGATIVE
GLUCOSE UA: NEGATIVE
KETONES UA: NEGATIVE
LEUKOCYTE ESTERASE UA: NEGATIVE
NITRITE UA: NEGATIVE
PROTEIN UA: 100 — AB
RBC UA: 110 /HPF — ABNORMAL HIGH (ref <=4)
SPECIFIC GRAVITY UA: 1.015 (ref 1.003–1.030)
SQUAMOUS EPITHELIAL: <1 /HPF (ref 0–5)
UROBILINOGEN UA: 0.2

## 2019-11-29 LAB — PHOSPHORUS: Phosphate:MCnc:Pt:Ser/Plas:Qn:: 2.7 — ABNORMAL LOW

## 2019-11-29 LAB — PROTEIN / CREATININE RATIO, URINE: PROTEIN/CREAT RATIO, URINE: 0.64

## 2019-11-29 LAB — CREATININE: Creatinine:MCnc:Pt:Ser/Plas:Qn:: 1.71 — ABNORMAL HIGH

## 2019-11-29 LAB — SQUAMOUS EPITHELIAL: Lab: <1

## 2019-11-29 LAB — PROTEIN URINE: Protein:MCnc:Pt:Urine:Qn:: 53.9

## 2019-11-29 MED ORDER — CALCITRIOL 0.25 MCG CAPSULE
ORAL_CAPSULE | Freq: Every day | ORAL | 11 refills | 30 days | Status: CP
Start: 2019-11-29 — End: 2020-11-28
  Filled 2019-12-09: qty 30, 30d supply, fill #0

## 2019-11-29 NOTE — Unmapped (Signed)
1252 Patient arrived Transplant Infusion Room today for Belatacept, Condition: well; Mobility: ambulating; accompanied by self.   See Flowsheet and MAR for all details of visit.   1255 VS stable,  1305 PIV placed, NS KVO; labs collected and sent, urine collected and sent.  1307 Infusion initiated.  1345 Infusion complete.  1355 VS stable, PIV removed, pt left clinic, Condition: well; Mobility: ambulating; accompanied by self.

## 2019-12-05 LAB — BK VIRUS QUANTITATIVE PCR, BLOOD: BK BLOOD RESULT: NOT DETECTED

## 2019-12-05 LAB — BK BLOOD LOG(10): Lab: 0

## 2019-12-09 MED FILL — CALCITRIOL 0.25 MCG CAPSULE: 30 days supply | Qty: 30 | Fill #0 | Status: AC

## 2019-12-10 DIAGNOSIS — Z114 Encounter for screening for human immunodeficiency virus [HIV]: Principal | ICD-10-CM

## 2019-12-10 DIAGNOSIS — Z940 Kidney transplant status: Principal | ICD-10-CM

## 2019-12-10 DIAGNOSIS — E119 Type 2 diabetes mellitus without complications: Principal | ICD-10-CM

## 2019-12-10 DIAGNOSIS — E559 Vitamin D deficiency, unspecified: Principal | ICD-10-CM

## 2019-12-10 DIAGNOSIS — R8279 Other abnormal findings on microbiological examination of urine: Principal | ICD-10-CM

## 2019-12-11 ENCOUNTER — Ambulatory Visit: Admit: 2019-12-11 | Discharge: 2019-12-11 | Payer: MEDICARE | Attending: Nephrology | Primary: Nephrology

## 2019-12-11 ENCOUNTER — Ambulatory Visit: Admit: 2019-12-11 | Discharge: 2019-12-11 | Payer: MEDICARE

## 2019-12-11 DIAGNOSIS — Z940 Kidney transplant status: Principal | ICD-10-CM

## 2019-12-11 DIAGNOSIS — R8279 Other abnormal findings on microbiological examination of urine: Principal | ICD-10-CM

## 2019-12-11 LAB — URINALYSIS
BILIRUBIN UA: NEGATIVE
KETONES UA: NEGATIVE
NITRITE UA: NEGATIVE
PH UA: 5 (ref 5.0–9.0)
PROTEIN UA: 100 — AB
RBC UA: >182 /HPF — ABNORMAL HIGH (ref <=4)
SPECIFIC GRAVITY UA: 1.021 (ref 1.003–1.030)
SQUAMOUS EPITHELIAL: 1 /HPF (ref 0–5)
UROBILINOGEN UA: 0.2
WBC UA: 14 /HPF — ABNORMAL HIGH (ref 0–5)

## 2019-12-11 LAB — CREATININE, URINE: Lab: 145.7

## 2019-12-11 LAB — PROTEIN / CREATININE RATIO, URINE: CREATININE, URINE: 145.7 mg/dL

## 2019-12-11 LAB — CLARITY

## 2019-12-11 NOTE — Unmapped (Signed)
AOBP   Patient's blood pressure was taken on right upper arm, medium cuff.   1st reading 139/84, pulse: 85  2nd reading 127/82, pulse: 82  3rd reading 116/79, pulse: 81  Average reading 127/82, pulse: 83

## 2019-12-11 NOTE — Unmapped (Signed)
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  2nd kidney transplant - 2000-2002 - DD failed chronic rejection.  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 Her last dialysis was on 03/07/19.  Has a good U.O.    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    Creatinine - value 1.71 , stable  Urine analysis: protein 30 mg/dl / WBC 3 / RBC 161 (0/9/60) - occasional dysmorphic.  Urine protein/creatinine ratio: 0.640 (11/29/19)  DSA: negative - 07/08/19    Last CMV checked: Detected  155 09/27/19, need repeat one.  Last BKV checked: 11/29/19    2) Immunosuppression: Myfortic 720 mg BID / Prednisone 5 mg daily / Belatacept monthly (last on 11/29/19)  - Changes in Immunosuppression - none   - On belatacept due to biopsy findings.  - Medications side effects: No     3) UTI  - History of recent recurrent UTI's. No UTI symptoms today.   - Previously referred to urogyn given risk of future antibiotic resistance    4) Hypotension - Controlled - off midodrine now.  Goal for B.P > 100 systolic.   Changes in B.P medications - none.    5) Anemia - stable - Hgb 12.6  Goal for Hemoglobin > 11.0  Ferritin 14.6 (09/04/19)   - Continue iron supplement once daily. Also previously suggested she look up iron-rich foods to incorporate into her diet    6) MBD - Calcium 10.0 / Phosphorus 2.7 - Secondary hyperparathyroidism  On calcitriol 0.25 mcg daily    7) Electrolytes: WNL 8) Leg swelling   - On diuretic (chlorthalidone) 12.5mg  PRN.     8) Immunizations:  Influenza (inactivated only): received 07/2019  Pneumococcal vaccination (inactivated only): will ask patient    9) Cancer screening:  PAP smear - 3/15 negative   Mammogram - Age 20.    10) Left UE swelling - patient following up in lymphedema clinic.    Follow up: 2 months or sooner if indicated.    History of Presenting Illness:     Since patient's last visit in the transplant clinic - patient has been doing well in terms of transplant, taking transplant medications regularly, no episodes of rejection and no side effects of medications.    Since last seen, she continues to follow-up with PT for her lymphedema and limited ROM in the interim.    Patient received Feraheme infusion 09/13/19.     Patient received belatacept infusions 09/27/19, 11/01/19, 11/29/19    Today, she presents with some leg swelling that is somewhat controlled. She reports her home BPs are within normal range. She denies chest pain and tightness, shortness of breath, n/v/d, or reduced appetitie.   Overall feeling good.     Diabetes: No   HTN: Yes: Hypotension    Controlled:Yes   Adherence  With Medication: yes    With Follow up: yes    Functional Status: independent    Patient was found to have reflux nephropathy at age 44 and was on peritoneal dialysis briefly before receiving a living related renal transplant from mother. She had some thrombotic complication and underwent nephrectomy in 1997. She started dialysis and had a second kidney transplant at age 64 in 53. 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 She received a 3rd kidney transplant in 11/2002 which failed in 2007 due to chronic rejection.    She has decreased vision both eyes.    Review of Systems:     Fever or chills: negative   Sore throat: negative   Fatigue/malaise: negative   Weight loss or gain: negative   New skin rash/lump or bump: negative   Problems with teeth/gums: negative   Chest pain: negative   Cough or shortness of breath: negative   Swelling: Leg swelling.  Abdominal pain/heartburn/nausea/vomiting or diarrhea: negative   Pain or bleeding when urinating: negative   Twitching/numbness or weakness: negative     Physical Exam:     Vital signs: BP 127/82 (BP Site: R Arm, BP Position: Sitting, BP Cuff Size: Medium)  - Pulse 83  - Temp 36 ??C (96.8 ??F) (Temporal)  - Ht 162.6 cm (5' 4)  - Wt 77.7 kg (171 lb 6.4 oz)  - LMP 11/20/2019 (Approximate)  - BMI 29.42 kg/m??    Nursing note and vitals reviewed.   Constitutional: Oriented to person, place, and time. Appears well-developed and well-nourished. No distress.   HENT: Patient wearing mask  Head: Normocephalic and atraumatic.   Eyes: Right eye exhibits no discharge. Left eye exhibits no discharge. No scleral icterus.   Neck: wearing mask  Cardiovascular: Normal rate and regular rhythm. Exam reveals no gallop and no friction rub. No murmur heard.   Pulmonary/Chest: Effort normal and breath sounds normal. No respiratory distress.   Abdominal: Soft. Non tender  Musculoskeletal: Normal range of motion.no edema B/L L.E, Left U.E 3+ lymphedema.   Neurological: Alert and oriented to person, place, and time.   Psychiatric: Normal mood and affect.     Renal Transplant History:    Race:  Caucasian   Age of recipient (at time of transplant): 39 y.o.   Cause of kidney disease: Reflux nephropathy   Native biopsy: No    Date of transplant: 12/05/18   Type of transplant: KDPI - 39%    - Donor creatinine: 1.3    - Any co morbidities: No    - Infection in donor: No    - HLA Mismatch: N/A    - Ischemia time: 6h 67m    - Crossmatch: negative    - Donor kidney biopsy: No diagnostic abnormalities identified (12/05/18)     Induction: Campath - alemtuzumab   Maintenance IS at the time of transplant: tacrolimus, mycophenolate   DGF: Yes   Diabetes Onset after transplant: No    Allergies:   Allergies   Allergen Reactions   ??? Ancef [Cefazolin] Shortness Of Breath   ??? Adhesive Tape-Silicones      Other reaction(s): Other (See Comments)  Uncoded Allergy. Allergen: plastic tape, Other Reaction: blistering   ??? Demerol [Meperidine] Nausea And Vomiting        Current Medications:   Current Outpatient Medications   Medication Sig Dispense Refill   ??? acetaminophen (TYLENOL) 500 MG tablet Take 2 tablets (1,000 mg total) by  mouth every six (6) hours as needed for pain. 100 tablet 0   ??? aspirin (ECOTRIN) 81 MG tablet Take 1 tablet (81 mg total) by mouth daily. 30 tablet 11   ??? calcitrioL (ROCALTROL) 0.25 MCG capsule Take 1 capsule (0.25 mcg total) by mouth daily. 30 capsule 11   ??? carboxymethylcellulose sodium (REFRESH CELLUVISC) 1 % DpGe Instill drops in affected eye(s) as directed as needed.     ??? chlorthalidone (HYGROTON) 25 MG tablet TAKE 0.5 TABLETS (12.5 MG TOTAL) BY MOUTH DAILY AS NEEDED. 30 tablet 11   ??? ferrous sulfate 325 (65 FE) MG tablet Take 325 mg by mouth daily.     ??? MYFORTIC 180 mg EC tablet Take 4 tablets (720 mg total) by mouth Two (2) times a day. 240 tablet 11   ??? omeprazole (PRILOSEC) 40 MG capsule Take 1 capsule (40 mg total) by mouth once daily 30 capsule 11   ??? predniSONE (DELTASONE) 2.5 MG tablet Take 2 tablets (5 mg total) by mouth daily. 60 tablet 11     No current facility-administered medications for this visit.        Past Medical History:   Past Medical History:   Diagnosis Date   ??? Bell's palsy    ??? Disease of thyroid gland    ??? ESRD (end stage renal disease) (CMS-HCC)    ??? Nonarteritic ischemic optic neuropathy    ??? Reflux nephropathy         Laboratory studies:     No results found for this or any previous visit (from the past 170 hour(s)).       Scribe's Attestation: Leeroy Bock, MD obtained and performed the history, physical exam and medical decision making elements that were entered into the chart.  Signed by Sharion Balloon, Scribe, on December 11, 2019 at 11:21 AM.    Documentation assistance provided by the Scribe. I was present during the time the encounter was recorded. The information recorded by the Scribe was done at my direction and has been reviewed and validated by me.

## 2019-12-16 ENCOUNTER — Ambulatory Visit: Admit: 2019-12-16 | Discharge: 2019-12-17 | Payer: MEDICARE

## 2019-12-19 NOTE — Unmapped (Signed)
[facility] Refill Coordination Note    Specialty Medication(s) to be Shipped:   Transplant: Myfortic 180mg  and Prednisone 2.5mg     Other medication(s) to be shipped: omeprazole 40mg      Kimberly [name], DOB: 02-22-1981  Phone: [phone] (home)       All above HIPAA information was verified with patient.     Was a Nurse, learning disability used for this call? No    Completed refill call assessment today to schedule patient's medication shipment from the [facility] [phone]).       Specialty medication(s) and dose(s) confirmed: Regimen is correct and unchanged.   Changes to medications: Kimberly [name] reports no changes at this time.  Changes to insurance: No  Questions for the pharmacist: No    Confirmed patient received Welcome Packet with first shipment. The patient will receive a drug information handout for each medication shipped and additional FDA Medication Guides as required.       DISEASE/MEDICATION-SPECIFIC INFORMATION        N/A    SPECIALTY MEDICATION ADHERENCE     Medication Adherence    Patient reported X missed doses in the last month: 0  Specialty Medication: Myfortic 180mg   Patient is on additional specialty medications: Yes  Additional Specialty Medications: Prednisone 2.5mg   Patient Reported Additional Medication X Missed Doses in the Last Month: 0  Patient is on more than two specialty medications: No          Myfortic 180 mg: 14 days of medicine on hand   Prednisone 2.5 mg: 14 days of medicine on hand     SHIPPING     Shipping address confirmed in Epic.     Delivery Scheduled: Yes, Expected medication delivery date: 12/26/2019.     Medication will be delivered via UPS to the prescription address in Epic WAM.    Kimberly [name] [facility] Specialty Technician

## 2019-12-25 MED FILL — PREDNISONE 2.5 MG TABLET: ORAL | 30 days supply | Qty: 60 | Fill #3

## 2019-12-25 MED FILL — MYFORTIC 180 MG TABLET,DELAYED RELEASE: 30 days supply | Qty: 240 | Fill #7 | Status: AC

## 2019-12-25 MED FILL — MYFORTIC 180 MG TABLET,DELAYED RELEASE: ORAL | 30 days supply | Qty: 240 | Fill #7

## 2019-12-25 MED FILL — OMEPRAZOLE 40 MG CAPSULE,DELAYED RELEASE: 30 days supply | Qty: 30 | Fill #1 | Status: AC

## 2019-12-25 MED FILL — PREDNISONE 2.5 MG TABLET: 30 days supply | Qty: 60 | Fill #3 | Status: AC

## 2019-12-25 MED FILL — OMEPRAZOLE 40 MG CAPSULE,DELAYED RELEASE: ORAL | 30 days supply | Qty: 30 | Fill #1

## 2019-12-27 ENCOUNTER — Ambulatory Visit: Admit: 2019-12-27 | Discharge: 2019-12-28 | Payer: MEDICARE

## 2019-12-27 LAB — CBC W/ AUTO DIFF
BASOPHILS ABSOLUTE COUNT: 0 10*9/L (ref 0.0–0.1)
BASOPHILS RELATIVE PERCENT: 0.6 %
EOSINOPHILS ABSOLUTE COUNT: 0 10*9/L (ref 0.0–0.4)
EOSINOPHILS RELATIVE PERCENT: 0.6 %
HEMATOCRIT: 36 % (ref 36.0–46.0)
HEMOGLOBIN: 11.6 g/dL — ABNORMAL LOW (ref 12.0–16.0)
LARGE UNSTAINED CELLS: 1 % (ref 0–4)
LYMPHOCYTES ABSOLUTE COUNT: 0.6 10*9/L — ABNORMAL LOW (ref 1.5–5.0)
LYMPHOCYTES RELATIVE PERCENT: 10.5 %
MEAN CORPUSCULAR HEMOGLOBIN CONC: 32.3 g/dL (ref 31.0–37.0)
MEAN CORPUSCULAR HEMOGLOBIN: 26 pg (ref 26.0–34.0)
MEAN CORPUSCULAR VOLUME: 80.5 fL (ref 80.0–100.0)
MEAN PLATELET VOLUME: 6.5 fL — ABNORMAL LOW (ref 7.0–10.0)
MONOCYTES ABSOLUTE COUNT: 0.3 10*9/L (ref 0.2–0.8)
MONOCYTES RELATIVE PERCENT: 4.9 %
NEUTROPHILS ABSOLUTE COUNT: 5 10*9/L (ref 2.0–7.5)
NEUTROPHILS RELATIVE PERCENT: 82.1 %
RED CELL DISTRIBUTION WIDTH: 15.4 % — ABNORMAL HIGH (ref 12.0–15.0)
WBC ADJUSTED: 6 10*9/L (ref 4.5–11.0)

## 2019-12-27 LAB — URINALYSIS
BILIRUBIN UA: NEGATIVE
KETONES UA: NEGATIVE
LEUKOCYTE ESTERASE UA: NEGATIVE
NITRITE UA: NEGATIVE
PH UA: 5 (ref 5.0–9.0)
PROTEIN UA: 100 — AB
RBC UA: 77 /HPF — ABNORMAL HIGH (ref <=4)
SPECIFIC GRAVITY UA: 1.02 (ref 1.003–1.030)
SQUAMOUS EPITHELIAL: <1 /HPF (ref 0–5)
UROBILINOGEN UA: 0.2
WBC UA: 4 /HPF (ref 0–5)

## 2019-12-27 LAB — HEMOGLOBIN A1C
ESTIMATED AVERAGE GLUCOSE: 97 mg/dL
Hemoglobin A1c/Hemoglobin.total:MFr:Pt:Bld:Qn:: 5

## 2019-12-27 LAB — PROTEIN / CREATININE RATIO, URINE: PROTEIN URINE: 65.4 mg/dL

## 2019-12-27 LAB — LIPID PANEL
CHOLESTEROL/HDL RATIO SCREEN: 3.8 (ref <5.0)
CHOLESTEROL: 219 mg/dL — ABNORMAL HIGH (ref 100–199)
HDL CHOLESTEROL: 58 mg/dL (ref 40–59)
LDL CHOLESTEROL CALCULATED: 102 mg/dL — ABNORMAL HIGH (ref 60–99)
NON-HDL CHOLESTEROL: 161 mg/dL
TRIGLYCERIDES: 293 mg/dL — ABNORMAL HIGH (ref 1–149)

## 2019-12-27 LAB — PROTEIN URINE: Protein:MCnc:Pt:Urine:Qn:: 65.4

## 2019-12-27 LAB — LYMPHOCYTES RELATIVE PERCENT: Lymphocytes/100 leukocytes:NFr:Pt:Bld:Qn:Automated count: 10.5

## 2019-12-27 LAB — LDL CHOLESTEROL CALCULATED: Cholesterol.in LDL:MCnc:Pt:Ser/Plas:Qn:Calculated: 102 — ABNORMAL HIGH

## 2019-12-27 LAB — PHOSPHORUS: Phosphate:MCnc:Pt:Ser/Plas:Qn:: 2.4 — ABNORMAL LOW

## 2019-12-27 LAB — PARATHYROID HORMONE INTACT: Parathyrin.intact:MCnc:Pt:Ser/Plas:Qn:: 188 — ABNORMAL HIGH

## 2019-12-27 LAB — UROBILINOGEN UA: Lab: 0.2

## 2019-12-27 LAB — MAGNESIUM: Magnesium:MCnc:Pt:Ser/Plas:Qn:: 1.7

## 2019-12-27 NOTE — Unmapped (Signed)
1300 Patient arrived Transplant Infusion Room today for belatacept, Condition: well; Mobility: ambulating; accompanied by self.   See Flowsheet and MAR for all details of visit.   1314 VS stable.  1327 PIV placed, NS KVO; labs collected and sent, urine collected and sent.  1330 Infusion initiated.  1400 Infusion complete.  1415 VS stable, PIV removed, pt left clinic, Condition: well; Mobility: ambulating; accompanied by self.

## 2019-12-30 LAB — EBV VIRAL LOAD RESULT: Lab: NOT DETECTED

## 2019-12-31 LAB — HIV ANTIGEN/ANTIBODY COMBO: HIV 1+2 Ab+HIV1 p24 Ag:PrThr:Pt:Ser/Plas:Ord:IA: NONREACTIVE

## 2019-12-31 LAB — BK VIRUS QUANTITATIVE PCR, BLOOD: BK BLOOD RESULT: NOT DETECTED

## 2019-12-31 LAB — BK BLOOD LOG(10): Lab: 0

## 2020-01-01 DIAGNOSIS — Z940 Kidney transplant status: Principal | ICD-10-CM

## 2020-01-02 LAB — VITAMIN D 1,25-DIHYDROXY: 1,25-Dihydroxyvitamin D:MCnc:Pt:Ser/Plas:Qn:: 45

## 2020-01-03 ENCOUNTER — Ambulatory Visit: Admit: 2020-01-03 | Discharge: 2020-01-04 | Payer: MEDICARE

## 2020-01-03 LAB — COMPREHENSIVE METABOLIC PANEL
ALBUMIN: 4.2 g/dL (ref 3.5–5.0)
ALKALINE PHOSPHATASE: 62 U/L (ref 38–126)
ALT (SGPT): 50 U/L — ABNORMAL HIGH (ref <35)
ANION GAP: 12 mmol/L (ref 7–15)
AST (SGOT): 27 U/L (ref 14–38)
BILIRUBIN TOTAL: 0.5 mg/dL (ref 0.0–1.2)
BLOOD UREA NITROGEN: 22 mg/dL — ABNORMAL HIGH (ref 7–21)
BUN / CREAT RATIO: 13
CALCIUM: 9.9 mg/dL (ref 8.5–10.2)
CHLORIDE: 100 mmol/L (ref 98–107)
CREATININE: 1.69 mg/dL — ABNORMAL HIGH (ref 0.60–1.00)
EGFR CKD-EPI NON-AA FEMALE: 38 mL/min/{1.73_m2} — ABNORMAL LOW (ref >=60)
GLUCOSE RANDOM: 90 mg/dL (ref 70–179)
POTASSIUM: 3.9 mmol/L (ref 3.5–5.0)
PROTEIN TOTAL: 6.8 g/dL (ref 6.5–8.3)
SODIUM: 139 mmol/L (ref 135–145)

## 2020-01-03 LAB — POTASSIUM: Potassium:SCnc:Pt:Ser/Plas:Qn:: 3.9

## 2020-01-05 LAB — FSAB CLASS 1 ANTIBODY SPECIFICITY

## 2020-01-05 LAB — HLA DS POST TRANSPLANT
ANTI-DONOR DRW #1 MFI: 329 MFI
ANTI-DONOR DRW #2 MFI: 117 MFI
ANTI-DONOR HLA-A #1 MFI: 54 MFI
ANTI-DONOR HLA-A #2 MFI: 131 MFI
ANTI-DONOR HLA-B #1 MFI: 1184 MFI — ABNORMAL HIGH
ANTI-DONOR HLA-B #2 MFI: 78 MFI
ANTI-DONOR HLA-C #1 MFI: 86 MFI
ANTI-DONOR HLA-C #2 MFI: 19 MFI
ANTI-DONOR HLA-DQB #1 MFI: 70 MFI
ANTI-DONOR HLA-DQB #2 MFI: 89 MFI
ANTI-DONOR HLA-DR #1 MFI: 35 MFI
ANTI-DONOR HLA-DR #2 MFI: 266 MFI

## 2020-01-05 LAB — FSAB CLASS 2 ANTIBODY SPECIFICITY: HLA CL2 AB RESULT: POSITIVE

## 2020-01-05 LAB — HLA CLASS 1 ANTIBODY

## 2020-01-05 LAB — CLASS 2 ANTIBODIES IDENTIFIED

## 2020-01-05 LAB — DONOR HLA-DQB ANTIGEN #1

## 2020-01-07 MED FILL — CALCITRIOL 0.25 MCG CAPSULE: 30 days supply | Qty: 30 | Fill #1 | Status: AC

## 2020-01-07 MED FILL — CALCITRIOL 0.25 MCG CAPSULE: ORAL | 30 days supply | Qty: 30 | Fill #1

## 2020-01-14 NOTE — Unmapped (Signed)
[facility] Clinical Assessment & Refill Coordination Note    Kimberly [name], DOB: [birth year]  Phone: [phone] (home)     All above HIPAA information was verified with patient.     Was a Nurse, learning disability used for this call? No    Specialty Medication(s):   Transplant: Myfortic 180mg  and Prednisone 2.5mg      Current Outpatient Medications   Medication Sig Dispense Refill   ??? acetaminophen (TYLENOL) 500 MG tablet Take 2 tablets (1,000 mg total) by mouth every six (6) hours as needed for pain. 100 tablet 0   ??? aspirin (ECOTRIN) 81 MG tablet Take 1 tablet (81 mg total) by mouth daily. 30 tablet 11   ??? calcitrioL (ROCALTROL) 0.25 MCG capsule Take 1 capsule (0.25 mcg total) by mouth daily. 30 capsule 11   ??? carboxymethylcellulose sodium (REFRESH CELLUVISC) 1 % DpGe Instill drops in affected eye(s) as directed as needed.     ??? chlorthalidone (HYGROTON) 25 MG tablet TAKE 0.5 TABLETS (12.5 MG TOTAL) BY MOUTH DAILY AS NEEDED. 30 tablet 11   ??? ferrous sulfate 325 (65 FE) MG tablet Take 325 mg by mouth daily.     ??? MYFORTIC 180 mg EC tablet Take 4 tablets (720 mg total) by mouth Two (2) times a day. 240 tablet 11   ??? omeprazole (PRILOSEC) 40 MG capsule Take 1 capsule (40 mg total) by mouth once daily 30 capsule 11   ??? predniSONE (DELTASONE) 2.5 MG tablet Take 2 tablets (5 mg total) by mouth daily. 60 tablet 11     No current facility-administered medications for this visit.         Changes to medications: Kimberly [name] reports no changes at this time.    Allergies   Allergen Reactions   ??? Ancef [Cefazolin] Shortness Of Breath   ??? Adhesive Tape-Silicones      Other reaction(s): Other (See Comments)  Uncoded Allergy. Allergen: plastic tape, Other Reaction: blistering   ??? Demerol [Meperidine] Nausea And Vomiting       Changes to allergies: No    SPECIALTY MEDICATION ADHERENCE     Myfortic 180mg   : 12 days of medicine on hand   Prednisone 2.5mg   : 12 days of medicine on hand     Medication Adherence Patient reported X missed doses in the last month: 0  Specialty Medication: myfortic 180mg   Patient is on additional specialty medications: Yes  Additional Specialty Medications: Prednisone 2.5mg   Patient Reported Additional Medication X Missed Doses in the Last Month: 0          Specialty medication(s) dose(s) confirmed: Regimen is correct and unchanged.     Are there any concerns with adherence? No    Adherence counseling provided? Not needed    CLINICAL MANAGEMENT AND INTERVENTION      Clinical Benefit Assessment:    Do you feel the medicine is effective or helping your condition? Yes    Clinical Benefit counseling provided? Not needed    Adverse Effects Assessment:    Are you experiencing any side effects? No    Are you experiencing difficulty administering your medicine? No    Quality of Life Assessment:    How many days over the past month did your transplant  keep you from your normal activities? For example, brushing your teeth or getting up in the morning. 0    Have you discussed this with your provider? Not needed    Therapy Appropriateness:    Is therapy appropriate? Yes,  therapy is appropriate and should be continued    DISEASE/MEDICATION-SPECIFIC INFORMATION      N/A    PATIENT SPECIFIC NEEDS     ? Does the patient have any physical, cognitive, or cultural barriers? No    ? Is the patient high risk? Yes, patient is taking a REMS drug. Medication is dispensed in compliance with REMS program.     ? Does the patient require a Care Management Plan? No     ? Does the patient require physician intervention or other additional services (i.e. nutrition, smoking cessation, social work)? No      SHIPPING     Specialty Medication(s) to be Shipped:   Transplant: Myfortic 180mg  and Prednisone 2.5mg     Other medication(s) to be shipped: omeprazole     Changes to insurance: No    Delivery Scheduled: Yes, Expected medication delivery date: 01/22/2020.     Medication will be delivered via UPS to the confirmed prescription address in [facility].    The patient will receive a drug information handout for each medication shipped and additional FDA Medication Guides as required.  Verified that patient has previously received a Conservation officer, historic buildings.    All of the patient's questions and concerns have been addressed.    Thad Ranger   [facility] Specialty Pharmacist

## 2020-01-17 IMAGING — MR MR HEAD W/O CM
10 series · 48 of 48 positions shown · non-contrast
Comparison: None.

CLINICAL DATA: Left facial droop for 2 months. More recent right
facial droop 2 weeks.

EXAM:
MRI HEAD WITHOUT CONTRAST
TECHNIQUE: Multiplanar, multiecho pulse sequences of the brain and surrounding
structures were obtained without intravenous contrast.

[Series 2: T1 · sagittal · 5.0mm · 0.45mm/px · 3 of 23 slices shown (1 of 2)]
[im 1/23]
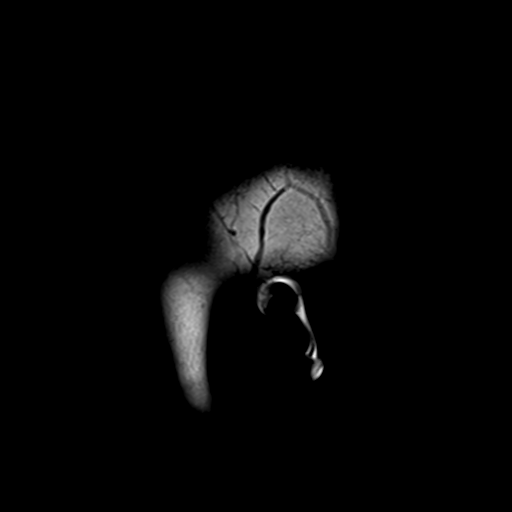
[im 12/23]
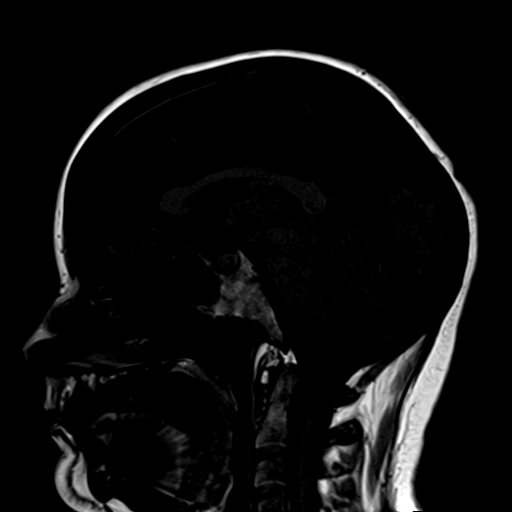
[im 23/23]
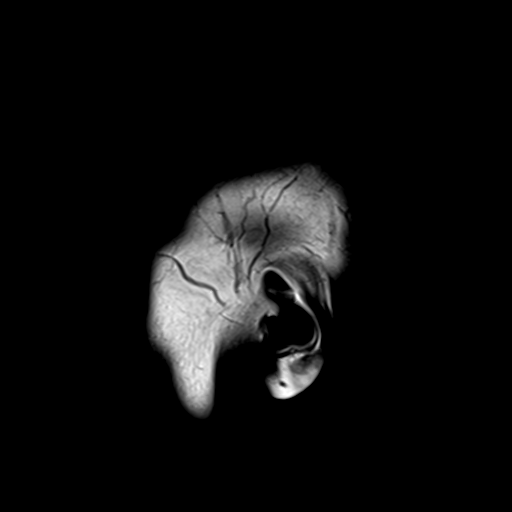

[Series 4: DWI · axial · 3.0mm · 1.20mm/px · z∈[-86,+76]mm · 5 of 55 slices shown (1 of 4)]
[im 1/55]
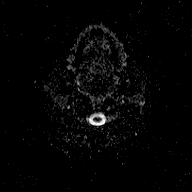
[im 14/55]
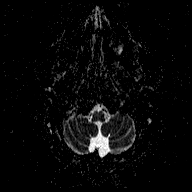
[im 28/55]
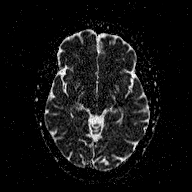
[im 41/55]
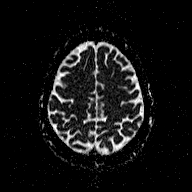
[im 55/55]
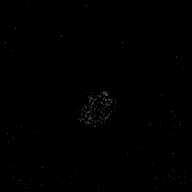

[Series 6: DWI · coronal · 3.0mm · 1.15mm/px · 4 of 45 slices shown (2 of 4)]
[im 1/45]
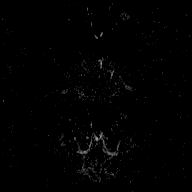
[im 15/45]
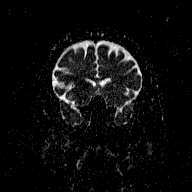
[im 30/45]
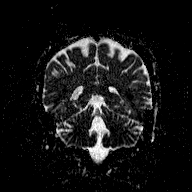
[im 45/45]
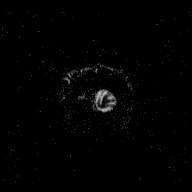

[Series 7: T2 · axial · 5.0mm · 0.72mm/px · z∈[-84,+70]mm · 2 of 23 slices shown (1 of 3)]
[im 1/23]
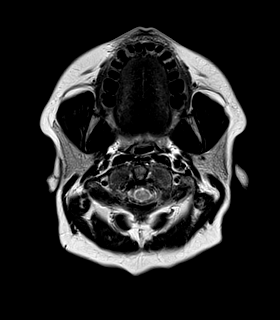
[im 23/23]
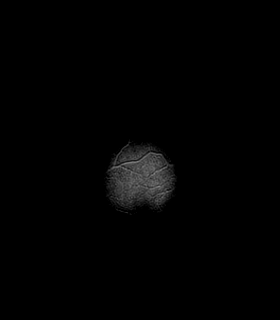

[Series 8: FLAIR · axial · 3.0mm · 0.45mm/px · z∈[-88,+74]mm · 5 of 55 slices shown]
[im 1/55]
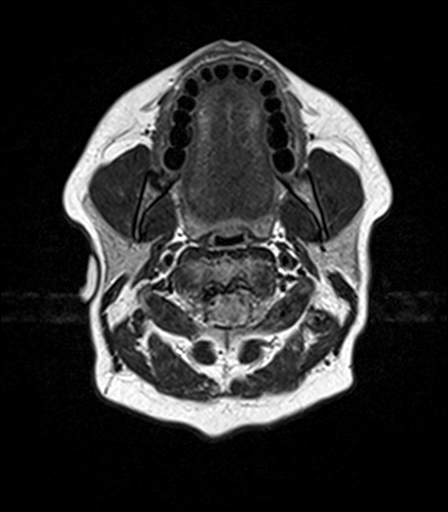
[im 14/55]
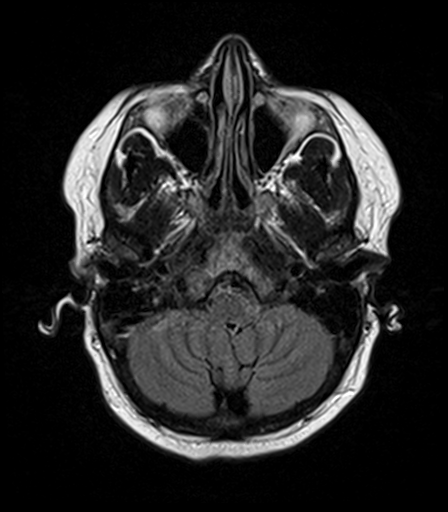
[im 28/55]
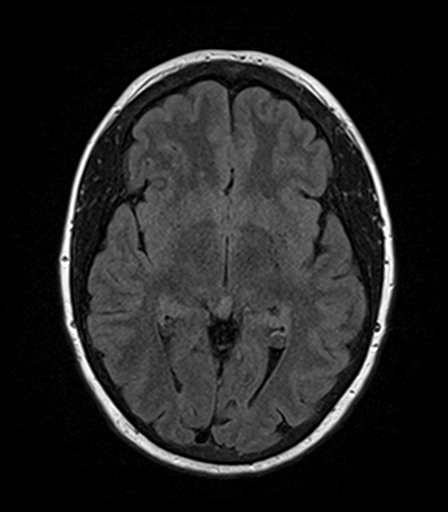
[im 41/55]
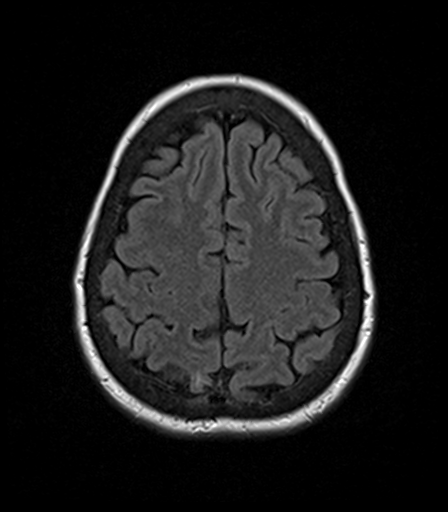
[im 55/55]
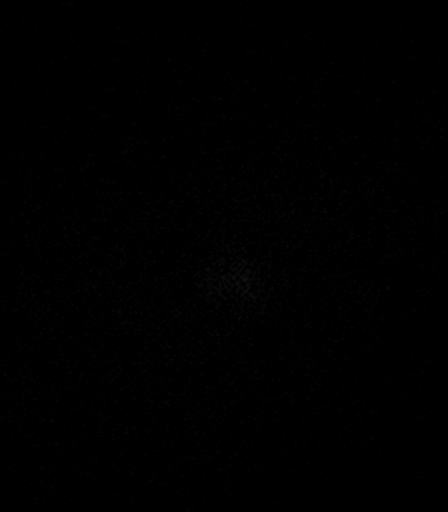

[Series 9: T2 · axial · 5.0mm · 0.72mm/px · z∈[-84,+70]mm · 2 of 23 slices shown (2 of 3)]
[im 1/23]
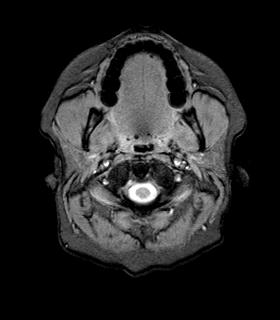
[im 23/23]
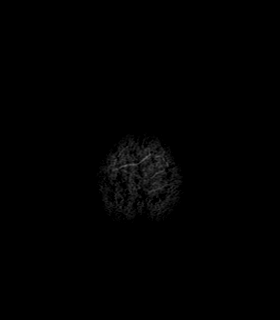

[Series 10: T1 · axial · 1.0mm · 1.00mm/px · z∈[-86,+73]mm · 15 of 160 slices shown (2 of 2)]
[im 1/160]
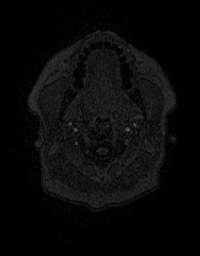
[im 12/160]
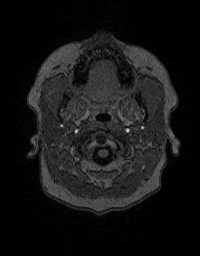
[im 23/160]
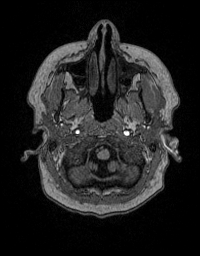
[im 35/160]
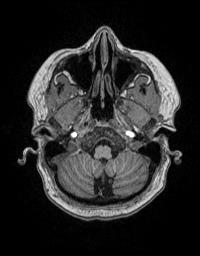
[im 46/160]
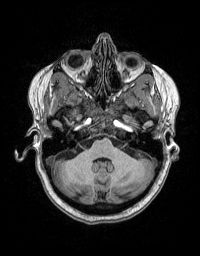
[im 57/160]
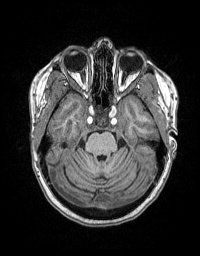
[im 69/160]
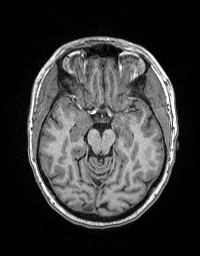
[im 80/160]
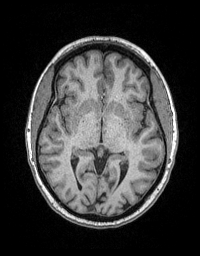
[im 91/160]
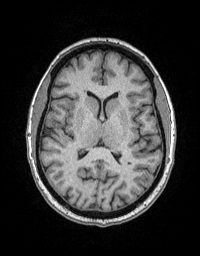
[im 103/160]
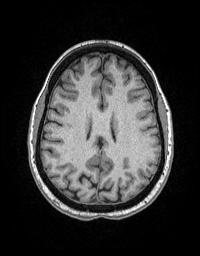
[im 114/160]
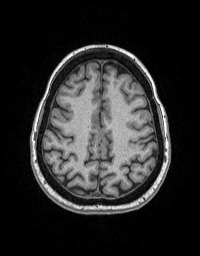
[im 125/160]
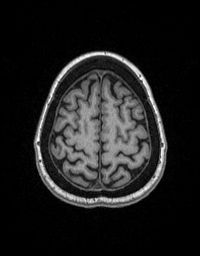
[im 137/160]
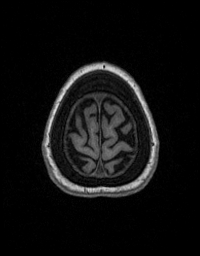
[im 148/160]
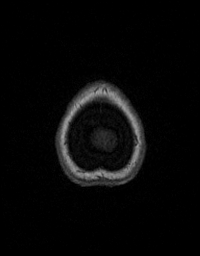
[im 160/160]
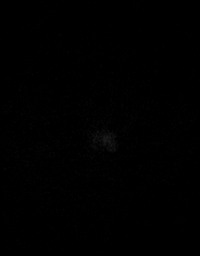

[Series 11: T2 · coronal · 5.0mm · 0.43mm/px · 3 of 29 slices shown (3 of 3)]
[im 1/29]
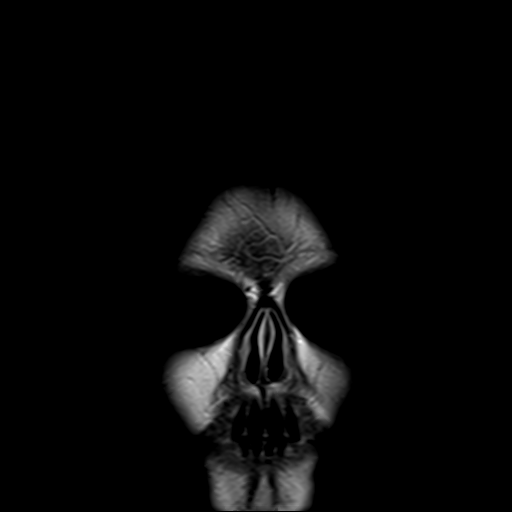
[im 15/29]
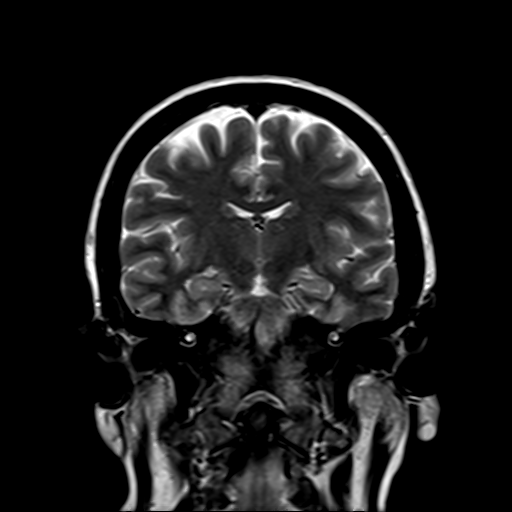
[im 29/29]
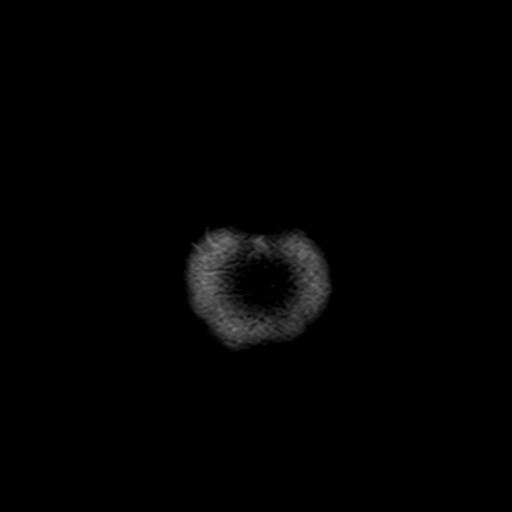

[Series 100: DWI · axial · 3.0mm · 1.20mm/px · z∈[-86,+76]mm · 5 of 55 slices shown (3 of 4)]
[im 1/55]
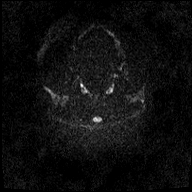
[im 14/55]
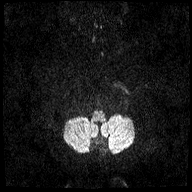
[im 28/55]
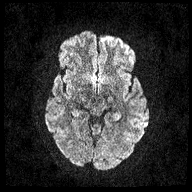
[im 41/55]
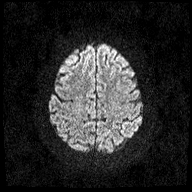
[im 55/55]
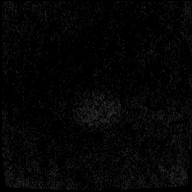

[Series 101: DWI · coronal · 3.0mm · 1.15mm/px · 4 of 45 slices shown (4 of 4)]
[im 1/45]
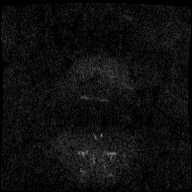
[im 15/45]
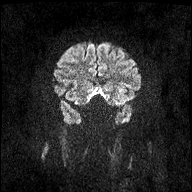
[im 30/45]
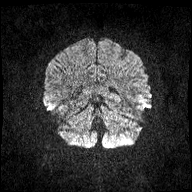
[im 45/45]
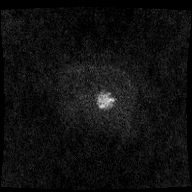

[48 of 48 positions shown; findings below may reference images not displayed]

FINDINGS: Brain: The scattered subcortical T2 hyperintensities are present
bilaterally. This is asymmetric on right. The largest right-sided
lesion in the posterior right frontal lobe measuring up to 6 mm. The
largest left sided lesion is 7 mm in the anterior left external
capsule. Cortical lesions are present. Basal ganglia are intact.
Brainstem and cerebellum are normal. Orbits callosum is within
normal. Ventricles are of normal size. No significant extra-axial
fluid collection is present.

No restricted diffusion is present to suggest active demyelination
or infarct.

Vascular: Flow is present in the major intracranial arteries.

Skull and upper cervical spine: Skull base is within normal limits.
Craniocervical junction is normal.

Sinuses/Orbits: Circumferential mucosal thickening is present in the
left maxillary sinus. There is a fluid level. The right sphenoid
sinus lateral recess is opacified.

Globes and orbits are otherwise normal.
IMPRESSION: 1. Scattered subcortical T2 hyperintensities bilaterally are
moderately advanced for age. This is somewhat asymmetric on the
right. The finding is nonspecific but can be seen in the setting of
chronic microvascular ischemia, a demyelinating process such as
multiple sclerosis, vasculitis, complicated migraine headaches, or
as the sequelae of a prior infectious or inflammatory process.
2. No acute intracranial abnormality.
3. Left maxillary sinus disease appears chronic.

## 2020-01-21 NOTE — Unmapped (Signed)
Kimberly [name] 's Myfortic/prednisone shipment will be delayed as a result of the medication is too soon to refill until 3/31.     I have reached out to the patient and communicated the delivery change. We will reschedule the medication for the delivery date that the patient agreed upon.  We have confirmed the delivery date as 04/01, via ups.

## 2020-01-22 MED FILL — OMEPRAZOLE 40 MG CAPSULE,DELAYED RELEASE: ORAL | 30 days supply | Qty: 30 | Fill #2

## 2020-01-22 MED FILL — MYFORTIC 180 MG TABLET,DELAYED RELEASE: 30 days supply | Qty: 240 | Fill #8 | Status: AC

## 2020-01-22 MED FILL — MYFORTIC 180 MG TABLET,DELAYED RELEASE: ORAL | 30 days supply | Qty: 240 | Fill #8

## 2020-01-22 MED FILL — PREDNISONE 2.5 MG TABLET: ORAL | 30 days supply | Qty: 60 | Fill #4

## 2020-01-22 MED FILL — PREDNISONE 2.5 MG TABLET: 30 days supply | Qty: 60 | Fill #4 | Status: AC

## 2020-01-22 MED FILL — OMEPRAZOLE 40 MG CAPSULE,DELAYED RELEASE: 30 days supply | Qty: 30 | Fill #2 | Status: AC

## 2020-01-31 ENCOUNTER — Ambulatory Visit: Admit: 2020-01-31 | Discharge: 2020-02-01 | Payer: MEDICARE

## 2020-01-31 DIAGNOSIS — Z940 Kidney transplant status: Principal | ICD-10-CM

## 2020-01-31 LAB — URINALYSIS
BACTERIA: NONE SEEN /HPF
BILIRUBIN UA: NEGATIVE
GLUCOSE UA: NEGATIVE
KETONES UA: NEGATIVE
LEUKOCYTE ESTERASE UA: NEGATIVE
NITRITE UA: NEGATIVE
PH UA: 6 (ref 5.0–9.0)
PROTEIN UA: NEGATIVE
RBC UA: <1 /HPF (ref <=4)
SQUAMOUS EPITHELIAL: <1 /HPF (ref 0–5)
UROBILINOGEN UA: 0.2
WBC UA: 1 /HPF (ref 0–5)

## 2020-01-31 LAB — CBC W/ AUTO DIFF
BASOPHILS ABSOLUTE COUNT: 0 10*9/L (ref 0.0–0.1)
BASOPHILS RELATIVE PERCENT: 0.3 %
EOSINOPHILS ABSOLUTE COUNT: 0 10*9/L (ref 0.0–0.4)
EOSINOPHILS RELATIVE PERCENT: 0.3 %
HEMATOCRIT: 36.1 % (ref 36.0–46.0)
HEMOGLOBIN: 11.3 g/dL — ABNORMAL LOW (ref 12.0–16.0)
LARGE UNSTAINED CELLS: 2 % (ref 0–4)
LYMPHOCYTES ABSOLUTE COUNT: 0.8 10*9/L — ABNORMAL LOW (ref 1.5–5.0)
LYMPHOCYTES RELATIVE PERCENT: 12.5 %
MEAN CORPUSCULAR HEMOGLOBIN CONC: 31.4 g/dL (ref 31.0–37.0)
MEAN CORPUSCULAR HEMOGLOBIN: 25.8 pg — ABNORMAL LOW (ref 26.0–34.0)
MEAN CORPUSCULAR VOLUME: 82.1 fL (ref 80.0–100.0)
MEAN PLATELET VOLUME: 7.1 fL (ref 7.0–10.0)
MONOCYTES RELATIVE PERCENT: 4.3 %
NEUTROPHILS ABSOLUTE COUNT: 5.4 10*9/L (ref 2.0–7.5)
PLATELET COUNT: 414 10*9/L (ref 150–440)
RED BLOOD CELL COUNT: 4.39 10*12/L (ref 4.00–5.20)
RED CELL DISTRIBUTION WIDTH: 15.1 % — ABNORMAL HIGH (ref 12.0–15.0)
WBC ADJUSTED: 6.7 10*9/L (ref 4.5–11.0)

## 2020-01-31 LAB — PROTEIN/CREAT RATIO, URINE: Protein/Creatinine:MRto:Pt:Urine:Qn:: 0.462

## 2020-01-31 LAB — BASIC METABOLIC PANEL
ANION GAP: 12 mmol/L (ref 7–15)
BLOOD UREA NITROGEN: 27 mg/dL — ABNORMAL HIGH (ref 7–21)
BUN / CREAT RATIO: 16
CALCIUM: 10 mg/dL (ref 8.5–10.2)
CHLORIDE: 102 mmol/L (ref 98–107)
CO2: 25 mmol/L (ref 22.0–30.0)
EGFR CKD-EPI AA FEMALE: 44 mL/min/{1.73_m2} — ABNORMAL LOW (ref >=60)
EGFR CKD-EPI NON-AA FEMALE: 39 mL/min/{1.73_m2} — ABNORMAL LOW (ref >=60)
GLUCOSE RANDOM: 101 mg/dL (ref 70–179)
POTASSIUM: 4.6 mmol/L (ref 3.5–5.0)
SODIUM: 139 mmol/L (ref 135–145)

## 2020-01-31 LAB — CALCIUM: Calcium:MCnc:Pt:Ser/Plas:Qn:: 10

## 2020-01-31 LAB — KETONES UA: Ketones:MCnc:Pt:Urine:Qn:Test strip: NEGATIVE

## 2020-01-31 LAB — WBC ADJUSTED: Leukocytes:NCnc:Pt:Bld:Qn:: 6.7

## 2020-01-31 LAB — PHOSPHORUS: Phosphate:MCnc:Pt:Ser/Plas:Qn:: 3

## 2020-01-31 LAB — PROTEIN / CREATININE RATIO, URINE
CREATININE, URINE: 22.1 mg/dL
PROTEIN URINE: 10.2 mg/dL

## 2020-01-31 MED FILL — CALCITRIOL 0.25 MCG CAPSULE: ORAL | 30 days supply | Qty: 30 | Fill #2

## 2020-01-31 MED FILL — CALCITRIOL 0.25 MCG CAPSULE: 30 days supply | Qty: 30 | Fill #2 | Status: AC

## 2020-01-31 NOTE — Unmapped (Signed)
1300 Patient arrived Transplant Infusion Room today for belatacept, Condition: well; Mobility: ambulating; accompanied by self.   See Flowsheet and MAR for all details of visit.   1314 VS stable.  1333 PIV placed and secured with coban. Labs collected and sent and urine collected and sent.  1335 Infusion initiated.  1406 Infusion complete.  1410 VS stable, PIV removed, pt left clinic, Condition: well; Mobility: ambulating; accompanied by self.

## 2020-02-05 LAB — BK BLOOD QUANT: Lab: 0

## 2020-02-05 LAB — BK VIRUS QUANTITATIVE PCR, BLOOD: BK BLOOD RESULT: NOT DETECTED

## 2020-02-19 DIAGNOSIS — Z940 Kidney transplant status: Principal | ICD-10-CM

## 2020-02-21 ENCOUNTER — Ambulatory Visit: Admit: 2020-02-21 | Discharge: 2020-02-22 | Payer: MEDICARE

## 2020-02-22 LAB — CMV DNA, QUANTITATIVE, PCR

## 2020-02-22 LAB — CMV QUANT LOG10: Lab: 0

## 2020-02-24 NOTE — Unmapped (Signed)
[facility] Refill Coordination Note    Specialty Medication(s) to be Shipped:   Transplant: Myfortic 180mg  and Prednisone 2.5mg     Other medication(s) to be shipped:   Calcitriol  Omeprazole       Kimberly [name], DOB: [birth year]  Phone: [phone] (home)       All above HIPAA information was verified with Patient     Was a translator used for this call? No    Completed refill call assessment today to schedule patient's medication shipment from the [facility] [phone]).       Specialty medication(s) and dose(s) confirmed: Regimen is correct and unchanged.   Changes to medications: [name] reports no changes at this time.  Changes to insurance: No  Questions for the pharmacist: No    Confirmed patient received Welcome Packet with first shipment. The patient will receive a drug information handout for each medication shipped and additional FDA Medication Guides as required.       DISEASE/MEDICATION-SPECIFIC INFORMATION        N/A    SPECIALTY MEDICATION ADHERENCE     Medication Adherence    Patient reported X missed doses in the last month: 0       Myfortic 180mg : 7 days worth of medication on hand.   Prednisone 2.5mg : 10 days worth of medication on hand        SHIPPING     Shipping address confirmed in Epic.     Delivery Scheduled: Yes, Expected medication delivery date: 02/27/20.     Medication will be delivered via UPS to the prescription address in Epic WAM.    Kimberly [name]   [facility] Specialty Technician

## 2020-02-25 DIAGNOSIS — Z940 Kidney transplant status: Principal | ICD-10-CM

## 2020-02-26 MED FILL — MYFORTIC 180 MG TABLET,DELAYED RELEASE: ORAL | 30 days supply | Qty: 240 | Fill #9

## 2020-02-26 MED FILL — OMEPRAZOLE 40 MG CAPSULE,DELAYED RELEASE: 30 days supply | Qty: 30 | Fill #3 | Status: AC

## 2020-02-26 MED FILL — PREDNISONE 2.5 MG TABLET: 30 days supply | Qty: 60 | Fill #5 | Status: AC

## 2020-02-26 MED FILL — PREDNISONE 2.5 MG TABLET: ORAL | 30 days supply | Qty: 60 | Fill #5

## 2020-02-26 MED FILL — OMEPRAZOLE 40 MG CAPSULE,DELAYED RELEASE: ORAL | 30 days supply | Qty: 30 | Fill #3

## 2020-02-26 MED FILL — MYFORTIC 180 MG TABLET,DELAYED RELEASE: 30 days supply | Qty: 240 | Fill #9 | Status: AC

## 2020-02-27 DIAGNOSIS — Z9189 Other specified personal risk factors, not elsewhere classified: Principal | ICD-10-CM

## 2020-02-27 DIAGNOSIS — Z7721 Contact with and (suspected) exposure to potentially hazardous body fluids: Principal | ICD-10-CM

## 2020-02-27 DIAGNOSIS — Z1159 Encounter for screening for other viral diseases: Principal | ICD-10-CM

## 2020-02-27 DIAGNOSIS — Z940 Kidney transplant status: Principal | ICD-10-CM

## 2020-02-27 DIAGNOSIS — Z750 Medical services not available in home: Principal | ICD-10-CM

## 2020-02-28 ENCOUNTER — Ambulatory Visit: Admit: 2020-02-28 | Discharge: 2020-02-28 | Payer: MEDICARE | Attending: Nephrology | Primary: Nephrology

## 2020-02-28 ENCOUNTER — Ambulatory Visit: Admit: 2020-02-28 | Discharge: 2020-02-28 | Payer: MEDICARE

## 2020-02-28 DIAGNOSIS — Z0000 Encounter for general adult medical examination without abnormal findings: Principal | ICD-10-CM

## 2020-02-28 DIAGNOSIS — Z940 Kidney transplant status: Principal | ICD-10-CM

## 2020-02-28 LAB — LIPID PANEL
CHOLESTEROL: 218 mg/dL — ABNORMAL HIGH (ref <=200)
LDL CHOLESTEROL CALCULATED: 121 mg/dL — ABNORMAL HIGH (ref 40–100)
NON-HDL CHOLESTEROL: 166 mg/dL — ABNORMAL HIGH (ref 70–130)
VLDL CHOLESTEROL CAL: 44.6 mg/dL — ABNORMAL HIGH (ref 8–32)

## 2020-02-28 LAB — CBC W/ AUTO DIFF
BASOPHILS ABSOLUTE COUNT: 0.1 10*9/L (ref 0.0–0.1)
BASOPHILS RELATIVE PERCENT: 0.6 %
EOSINOPHILS ABSOLUTE COUNT: 0 10*9/L (ref 0.0–0.7)
EOSINOPHILS RELATIVE PERCENT: 0.3 %
HEMATOCRIT: 35.2 % (ref 35.0–44.0)
HEMOGLOBIN: 11.3 g/dL — ABNORMAL LOW (ref 12.0–15.5)
LYMPHOCYTES ABSOLUTE COUNT: 0.6 10*9/L — ABNORMAL LOW (ref 0.7–4.0)
LYMPHOCYTES RELATIVE PERCENT: 6.8 %
MEAN CORPUSCULAR HEMOGLOBIN CONC: 32.2 g/dL (ref 30.0–36.0)
MEAN CORPUSCULAR HEMOGLOBIN: 25.2 pg — ABNORMAL LOW (ref 26.0–34.0)
MEAN CORPUSCULAR VOLUME: 78.3 fL — ABNORMAL LOW (ref 82.0–98.0)
MEAN PLATELET VOLUME: 6.5 fL — ABNORMAL LOW (ref 7.0–10.0)
MONOCYTES ABSOLUTE COUNT: 0.6 10*9/L (ref 0.1–1.0)
MONOCYTES RELATIVE PERCENT: 6.6 %
NEUTROPHILS ABSOLUTE COUNT: 7.9 10*9/L — ABNORMAL HIGH (ref 1.7–7.7)
NEUTROPHILS RELATIVE PERCENT: 85.7 %
PLATELET COUNT: 361 10*9/L (ref 150–450)
RED CELL DISTRIBUTION WIDTH: 15.1 % — ABNORMAL HIGH (ref 12.0–15.0)
WBC ADJUSTED: 9.2 10*9/L (ref 3.5–10.5)

## 2020-02-28 LAB — URINALYSIS
BILIRUBIN UA: NEGATIVE
GLUCOSE UA: NEGATIVE
KETONES UA: NEGATIVE
LEUKOCYTE ESTERASE UA: NEGATIVE
NITRITE UA: NEGATIVE
PH UA: 6 (ref 5.0–9.0)
PROTEIN UA: NEGATIVE
RBC UA: 9 /HPF — ABNORMAL HIGH (ref <4)
SPECIFIC GRAVITY UA: <=1.005 (ref 1.005–1.030)
SQUAMOUS EPITHELIAL: <1 /HPF (ref 0–5)
WBC UA: <1 /HPF (ref 0–5)

## 2020-02-28 LAB — ALKALINE PHOSPHATASE: Alkaline phosphatase:CCnc:Pt:Ser/Plas:Qn:: 84

## 2020-02-28 LAB — MAGNESIUM
MAGNESIUM: 1.9 mg/dL (ref 1.6–2.6)
Magnesium:MCnc:Pt:Ser/Plas:Qn:: 1.9

## 2020-02-28 LAB — COMPREHENSIVE METABOLIC PANEL
ALBUMIN: 4.1 g/dL (ref 3.4–5.0)
ALKALINE PHOSPHATASE: 84 U/L (ref 46–116)
ALT (SGPT): 52 U/L — ABNORMAL HIGH (ref 10–49)
AST (SGOT): 27 U/L (ref <34)
BILIRUBIN TOTAL: 0.5 mg/dL (ref 0.3–1.2)
BLOOD UREA NITROGEN: 21 mg/dL (ref 9–23)
BUN / CREAT RATIO: 14
CALCIUM: 10.3 mg/dL (ref 8.7–10.4)
CHLORIDE: 101 mmol/L (ref 98–107)
CO2: 24.7 mmol/L (ref 20.0–31.0)
CREATININE: 1.54 mg/dL — ABNORMAL HIGH (ref 0.60–1.10)
EGFR CKD-EPI AA FEMALE: 49 mL/min/{1.73_m2}
EGFR CKD-EPI NON-AA FEMALE: 42 mL/min/{1.73_m2}
GLUCOSE RANDOM: 106 mg/dL (ref 70–179)
POTASSIUM: 3.4 mmol/L — ABNORMAL LOW (ref 3.5–5.1)
PROTEIN TOTAL: 7.2 g/dL (ref 5.7–8.2)
SODIUM: 136 mmol/L (ref 135–145)

## 2020-02-28 LAB — EOSINOPHILS RELATIVE PERCENT: Eosinophils/100 leukocytes:NFr:Pt:Bld:Qn:Automated count: 0.3

## 2020-02-28 LAB — PROTEIN/CREAT RATIO, URINE: Protein/Creatinine:MRto:Pt:Urine:Qn:: 0.38

## 2020-02-28 LAB — PROTEIN / CREATININE RATIO, URINE
CREATININE, URINE: 24.2 mg/dL
PROTEIN URINE: 9.2 mg/dL

## 2020-02-28 LAB — PHOSPHORUS: Phosphate:MCnc:Pt:Ser/Plas:Qn:: 2.5

## 2020-02-28 LAB — TRIGLYCERIDES: Triglyceride:MCnc:Pt:Ser/Plas:Qn:: 223 — ABNORMAL HIGH

## 2020-02-28 LAB — SQUAMOUS EPITHELIAL: Lab: <1

## 2020-02-28 MED ORDER — PRAVASTATIN 20 MG TABLET
ORAL_TABLET | Freq: Every day | ORAL | 3 refills | 90.00000 days | Status: CP
Start: 2020-02-28 — End: 2021-02-27

## 2020-02-28 NOTE — Unmapped (Signed)
AOBP  #1 147/91  87  #2  132/83  81  #3 134/85  85  Avg 138/86  84

## 2020-02-28 NOTE — Unmapped (Signed)
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  2nd kidney transplant - 2000-2002 - DD failed chronic rejection.  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 Her last dialysis was on 03/07/19.  Has a good U.O.    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    Creatinine - value 1.54, stable  Urine analysis: protein negative / WBC <1 / RBC 9 .  Urine protein/creatinine ratio: 0.380  DSA: positive  B7 - 1184 - 12/27/19    Last CMV checked: Not detected 02/28/20  Last BKV checked: Not detected 02/28/20    Decrease Prilosec to every other day. If reflux symptoms do not return, discontinue it.     2) Immunosuppression: Myfortic 720 mg BID / Prednisone 5 mg daily / Belatacept monthly (last on 01/31/20)  - Changes in Immunosuppression - none   - On belatacept due to biopsy findings.  - Medications side effects: No     3) UTI  - History of recent recurrent UTI's. No UTI symptoms today.   - Previously referred to urogyn given risk of future antibiotic resistance    4) Hypotension - Controlled - off midodrine now.  Goal for B.P > 100 systolic.   Changes in B.P medications - none.    5) Anemia - stable - Hgb 11.3  Goal for Hemoglobin > 11.0  Ferritin 14.6 (09/04/19)   - Continue iron supplement once daily. Also previously suggested she look up iron-rich foods to incorporate into her diet    6) MBD - Calcium 10.3 / Phosphorus 2.5 - Secondary hyperparathyroidism  On calcitriol 0.25 mcg daily    7) Electrolytes: WNL    8) Leg swelling   - On diuretic (chlorthalidone) 12.5mg  PRN.     8) Immunizations:  Influenza (inactivated only): received 07/2019  Pneumococcal vaccination (inactivated only): will ask patient    9) Cancer screening:  PAP smear - 11/06/19 negative   Mammogram - Age 39.    10) Left UE swelling - patient following up in lymphedema clinic. Last visit Dec 2020.     11) High Cholesterol - Start pravastatin 20 mg daily    Follow up: 3 months or sooner if indicated.    History of Presenting Illness:     Since patient's last visit in the transplant clinic - patient has been doing well in terms of transplant, taking transplant medications regularly, no episodes of rejection and no side effects of medications.    Since last seen, patient received belatacept infusions 12/27/19 and 01/31/20    Today, she presents feeling overall well and without any complaints. Per patient, leg swelling is improving and she continues to take half a diuretic prn. Her reflux is also well-controlled.     Patient has received both COVID-19 shots    She denies chest pain and tightness or n/v/d.  Diabetes: No   HTN: Yes: Hypotension    Controlled:Yes   Adherence      With Medication: yes    With Follow up: yes    Functional Status: independent    Patient was found to have reflux nephropathy at age 39 and was on peritoneal dialysis briefly before receiving a living related renal transplant from mother. She had some thrombotic complication and underwent nephrectomy in 1997. She started dialysis and had a second kidney transplant at age 39 in 3. 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 She received a 3rd kidney transplant in 11/2002 which failed in 2007 due to chronic rejection.    She has decreased vision both eyes.    Review of Systems:     Fever or chills: negative   Sore throat: negative   Fatigue/malaise: negative   Weight loss or gain: negative   New skin rash/lump or bump: negative   Problems with teeth/gums: negative   Chest pain: negative   Cough or shortness of breath: negative   Swelling: Leg swelling.  Abdominal pain/heartburn/nausea/vomiting or diarrhea: negative   Pain or bleeding when urinating: negative   Twitching/numbness or weakness: negative     Physical Exam:     Vital signs: BP 138/86 (BP Site: R Arm, BP Position: Sitting, BP Cuff Size: Medium)  - Pulse 84  - Temp 36.6 ??C (97.8 ??F) (Temporal)    Nursing note and vitals reviewed.   Constitutional: Oriented to person, place, and time. Appears well-developed and well-nourished. No distress.   HENT: Patient wearing mask  Head: Normocephalic and atraumatic.   Eyes: Right eye exhibits no discharge. Left eye exhibits no discharge. No scleral icterus.   Neck: wearing mask  Cardiovascular: Normal rate and regular rhythm.  No murmur heard.   Pulmonary/Chest: Effort normal and breath sounds normal. No respiratory distress.   Abdominal: Soft. Non tender  Musculoskeletal: Normal range of motion.no edema B/L L.E, Left U.E 3+ lymphedema.   Neurological: Alert and oriented to person, place, and time.   Psychiatric: Normal mood and affect.     Renal Transplant History:    Race:  Caucasian   Age of recipient (at time of transplant): 39 y.o.   Cause of kidney disease: Reflux nephropathy   Native biopsy: No    Date of transplant: 12/05/18   Type of transplant: KDPI - 39%    - Donor creatinine: 1.3    - Any co morbidities: No    - Infection in donor: No    - HLA Mismatch: N/A    - Ischemia time: 6h 24m    - Crossmatch: negative    - Donor kidney biopsy: No diagnostic abnormalities identified (12/05/18)     Induction: [place] - alemtuzumab   Maintenance IS at the time of transplant: tacrolimus, mycophenolate   DGF: Yes   Diabetes Onset after transplant: No    Allergies:   Allergies   Allergen Reactions   ??? Ancef [Cefazolin] Shortness Of Breath   ??? Adhesive Tape-Silicones      Other reaction(s): Other (See Comments)  Uncoded Allergy. Allergen: plastic tape, Other Reaction: blistering   ??? Demerol [Meperidine] Nausea And Vomiting        Current Medications:   Current Outpatient Medications   Medication Sig Dispense Refill   ??? acetaminophen (TYLENOL) 500 MG tablet Take 2 tablets (1,000 mg total) by mouth every six (6) hours as needed for pain. 100 tablet 0   ???  aspirin (ECOTRIN) 81 MG tablet Take 1 tablet (81 mg total) by mouth daily. 30 tablet 11   ??? calcitrioL (ROCALTROL) 0.25 MCG capsule Take 1 capsule (0.25 mcg total) by mouth daily. 30 capsule 11   ??? carboxymethylcellulose sodium (REFRESH CELLUVISC) 1 % DpGe Instill drops in affected eye(s) as directed as needed.     ??? chlorthalidone (HYGROTON) 25 MG tablet TAKE 0.5 TABLETS (12.5 MG TOTAL) BY MOUTH DAILY AS NEEDED. 30 tablet 11   ??? ferrous sulfate 325 (65 FE) MG tablet Take 325 mg by mouth daily.     ??? MYFORTIC 180 mg EC tablet Take 4 tablets (720 mg total) by mouth Two (2) times a day. 240 tablet 11   ??? omeprazole (PRILOSEC) 40 MG capsule Take 1 capsule (40 mg total) by mouth once daily 30 capsule 11   ??? pravastatin (PRAVACHOL) 20 MG tablet Take 1 tablet (20 mg total) by mouth daily. 90 tablet 3   ??? predniSONE (DELTASONE) 2.5 MG tablet Take 2 tablets (5 mg total) by mouth daily. 60 tablet 11     No current facility-administered medications for this visit.       Past Medical History:   Past Medical History:   Diagnosis Date   ??? Bell's palsy    ??? Disease of thyroid gland    ??? ESRD (end stage renal disease) (CMS-HCC)    ??? Nonarteritic ischemic optic neuropathy    ??? Reflux nephropathy         Laboratory studies:     Recent Results (from the past 170 hour(s))   CMV DNA, quantitative, PCR    Collection Time: 02/21/20  3:33 PM   Result Value Ref Range    CMV Viral Ld Detected (A) Not Detected    CMV Quant <50 (H) <0 IU/mL    CMV Quant Log10      CMV Comment       Scribe's Attestation: Leeroy Bock, MD obtained and performed the history, physical exam and medical decision making elements that were entered into the chart.  Signed by Sharion Balloon, Scribe, on Feb 28, 2020 at 11:00 AM.    Documentation assistance provided by the Scribe. I was present during the time the encounter was recorded. The information recorded by the Scribe was done at my direction and has been reviewed and validated by me.

## 2020-02-28 NOTE — Unmapped (Signed)
1150 Arrives from MD appt upstairs for Belatacept infusion.  Denies any recent illness, fever, allergic reaction or antibiotic use.  No c/o pain at this time.  See flowsheet and MAR for details of visit. VSS, PIV placed in RAC with good blood return, labs collected and sent to lab.  Pt had brought urine spec that was collected upstairs at her md appt and it was also sent to the lab.     1226 Belatacept infusion 412.5 mg in 100 ml ns to infuse at 200 ml/hr.    1259 Belatacept infusion complete.  Pt denies any signs or symptoms of allergic reaction.  IV flushed and removed, dsd and coban applied.  Discharged home with no complaints.

## 2020-02-29 LAB — CMV DNA, QUANTITATIVE, PCR: CMV VIRAL LD: NOT DETECTED

## 2020-02-29 LAB — CMV QUANT: Lab: 0

## 2020-03-01 LAB — HEPATITIS B SURFACE ANTIBODY
HEPATITIS B SURFACE ANTIBODY QUANT: 79.24 m[IU]/mL — ABNORMAL HIGH (ref <8.00)
Hepatitis B virus surface Ab:PrThr:Pt:Ser:Ord:: REACTIVE — AB

## 2020-03-01 LAB — HEPATITIS B CORE IGM ANTIBODY: Hepatitis B virus core Ab.IgM:PrThr:Pt:Ser:Ord:: NONREACTIVE

## 2020-03-02 LAB — VITAMIN D 25 HYDROXY: VITAMIN D, TOTAL (25OH): 15.7 ng/mL — ABNORMAL LOW (ref 20.0–80.0)

## 2020-03-02 LAB — VITAMIN D, TOTAL (25OH): Lab: 15.7 — ABNORMAL LOW

## 2020-03-02 MED FILL — CALCITRIOL 0.25 MCG CAPSULE: ORAL | 30 days supply | Qty: 30 | Fill #3

## 2020-03-02 MED FILL — CALCITRIOL 0.25 MCG CAPSULE: 30 days supply | Qty: 30 | Fill #3 | Status: AC

## 2020-03-03 LAB — HBV DNA QUANT (MAYO): Hepatitis B virus DNA:ACnc:Pt:Ser/Plas:Qn:Probe.amp.tar: NOT DETECTED

## 2020-03-04 DIAGNOSIS — Z940 Kidney transplant status: Principal | ICD-10-CM

## 2020-03-04 DIAGNOSIS — Z0000 Encounter for general adult medical examination without abnormal findings: Principal | ICD-10-CM

## 2020-03-04 DIAGNOSIS — N186 End stage renal disease: Principal | ICD-10-CM

## 2020-03-04 LAB — BK BLOOD RESULT: Lab: NOT DETECTED

## 2020-03-04 LAB — BK VIRUS QUANTITATIVE PCR, BLOOD

## 2020-03-10 LAB — HLA DS POST TRANSPLANT
ANTI-DONOR DRW #1 MFI: 458 MFI
ANTI-DONOR DRW #2 MFI: 152 MFI
ANTI-DONOR HLA-A #1 MFI: 53 MFI
ANTI-DONOR HLA-A #2 MFI: 131 MFI
ANTI-DONOR HLA-B #1 MFI: 1091 MFI — ABNORMAL HIGH
ANTI-DONOR HLA-B #2 MFI: 54 MFI
ANTI-DONOR HLA-C #1 MFI: 71 MFI
ANTI-DONOR HLA-C #2 MFI: 0 MFI
ANTI-DONOR HLA-DP AG #1 MFI: 348 MFI
ANTI-DONOR HLA-DQB #1 MFI: 118 MFI
ANTI-DONOR HLA-DQB #2 MFI: 152 MFI
ANTI-DONOR HLA-DR #2 MFI: 329 MFI

## 2020-03-10 LAB — DONOR HLA-DQB ANTIGEN #2

## 2020-03-10 LAB — FSAB CLASS 2 ANTIBODY SPECIFICITY

## 2020-03-10 LAB — FSAB CLASS 1 ANTIBODY SPECIFICITY

## 2020-03-10 LAB — HLA CLASS 1 ANTIBODY RESULT: Lab: POSITIVE

## 2020-03-10 LAB — HLA CL2 AB COMMENT: Lab: 0

## 2020-03-19 NOTE — Unmapped (Signed)
[facility] Refill Coordination Note     Specialty Medication(s) to be Shipped:   Transplant: Myfortic 180mg  and Prednisone 2.5mg     Other medication(s) to be shipped: calcitriol, omeprazole     Kimberly [name], DOB: Dec 15, 1980  Phone: [phone] (home)       All above HIPAA information was verified with patient.     Was a Nurse, learning disability used for this call? No    Completed refill call assessment today to schedule patient's medication shipment from the [facility] [phone]).       Specialty medication(s) and dose(s) confirmed: Regimen is correct and unchanged.   Changes to medications: Mazie reports no changes at this time.  Changes to insurance: No  Questions for the pharmacist: No    Confirmed patient received Welcome Packet with first shipment. The patient will receive a drug information handout for each medication shipped and additional FDA Medication Guides as required.       DISEASE/MEDICATION-SPECIFIC INFORMATION        N/A    SPECIALTY MEDICATION ADHERENCE     Medication Adherence    Patient reported X missed doses in the last month: 0  Specialty Medication: Myfortic 180mg   Patient is on additional specialty medications: Yes  Additional Specialty Medications: Prednisone 2.5mg   Patient Reported Additional Medication X Missed Doses in the Last Month: 0  Patient is on more than two specialty medications: No                Myfortic 180 mg: 13 days of medicine on hand   Prednisone 2.5 mg: 13 days of medicine on hand        SHIPPING     Shipping address confirmed in Epic.     Delivery Scheduled: Yes, Expected medication delivery date: 03/30/20.     Medication will be delivered via Same Day Courier to the prescription address in Epic WAM.    Tera Helper   [facility] Specialty Pharmacist

## 2020-03-27 ENCOUNTER — Ambulatory Visit: Admit: 2020-03-27 | Discharge: 2020-03-28 | Payer: MEDICARE

## 2020-03-27 LAB — URINALYSIS
BACTERIA: NONE SEEN /HPF
BILIRUBIN UA: NEGATIVE
GLUCOSE UA: NEGATIVE
KETONES UA: NEGATIVE
LEUKOCYTE ESTERASE UA: NEGATIVE
PH UA: 6.5 (ref 5.0–9.0)
PROTEIN UA: NEGATIVE
RBC UA: <1 /HPF (ref <4)
SQUAMOUS EPITHELIAL: <1 /HPF (ref 0–5)
UROBILINOGEN UA: 0.2
WBC UA: <1 /HPF (ref 0–5)

## 2020-03-27 LAB — CBC W/ AUTO DIFF
BASOPHILS ABSOLUTE COUNT: 0 10*9/L (ref 0.0–0.1)
BASOPHILS RELATIVE PERCENT: 0.4 %
EOSINOPHILS ABSOLUTE COUNT: 0.1 10*9/L (ref 0.0–0.7)
EOSINOPHILS RELATIVE PERCENT: 1.4 %
HEMATOCRIT: 33.8 % — ABNORMAL LOW (ref 35.0–44.0)
HEMOGLOBIN: 10.9 g/dL — ABNORMAL LOW (ref 12.0–15.5)
LYMPHOCYTES ABSOLUTE COUNT: 1 10*9/L (ref 0.7–4.0)
LYMPHOCYTES RELATIVE PERCENT: 14 %
MEAN CORPUSCULAR HEMOGLOBIN CONC: 32.3 g/dL (ref 30.0–36.0)
MEAN CORPUSCULAR HEMOGLOBIN: 25.1 pg — ABNORMAL LOW (ref 26.0–34.0)
MEAN CORPUSCULAR VOLUME: 77.8 fL — ABNORMAL LOW (ref 82.0–98.0)
MEAN PLATELET VOLUME: 6.4 fL — ABNORMAL LOW (ref 7.0–10.0)
MONOCYTES RELATIVE PERCENT: 8.1 %
NEUTROPHILS ABSOLUTE COUNT: 5.6 10*9/L (ref 1.7–7.7)
NUCLEATED RED BLOOD CELLS: 0 /100{WBCs} (ref <=4)
PLATELET COUNT: 344 10*9/L (ref 150–450)
RED BLOOD CELL COUNT: 4.34 10*12/L (ref 3.90–5.03)
RED CELL DISTRIBUTION WIDTH: 14.7 % (ref 12.0–15.0)
WBC ADJUSTED: 7.3 10*9/L (ref 3.5–10.5)

## 2020-03-27 LAB — COMPREHENSIVE METABOLIC PANEL
ALBUMIN: 3.9 g/dL (ref 3.4–5.0)
ALKALINE PHOSPHATASE: 81 U/L (ref 46–116)
ANION GAP: 8 mmol/L (ref 3–11)
AST (SGOT): 25 U/L (ref <34)
BILIRUBIN TOTAL: 0.7 mg/dL (ref 0.3–1.2)
BLOOD UREA NITROGEN: 19 mg/dL (ref 9–23)
BUN / CREAT RATIO: 13
CALCIUM: 9.9 mg/dL (ref 8.7–10.4)
CHLORIDE: 102 mmol/L (ref 98–107)
CO2: 24 mmol/L (ref 20.0–31.0)
CREATININE: 1.52 mg/dL — ABNORMAL HIGH (ref 0.50–0.80)
EGFR CKD-EPI AA FEMALE: 49 mL/min/{1.73_m2}
EGFR CKD-EPI NON-AA FEMALE: 43 mL/min/{1.73_m2}
GLUCOSE RANDOM: 90 mg/dL (ref 70–179)
PROTEIN TOTAL: 6.7 g/dL (ref 5.7–8.2)
SODIUM: 134 mmol/L — ABNORMAL LOW (ref 135–145)

## 2020-03-27 LAB — RBC UA: Erythrocytes:Naric:Pt:Urine sed:Qn:Microscopy.light.HPF: <1

## 2020-03-27 LAB — MAGNESIUM: Magnesium:MCnc:Pt:Ser/Plas:Qn:: 1.8

## 2020-03-27 LAB — PHOSPHORUS: Phosphate:MCnc:Pt:Ser/Plas:Qn:: 2.4

## 2020-03-27 LAB — CHLORIDE: Chloride:SCnc:Pt:Ser/Plas:Qn:: 102

## 2020-03-27 LAB — NUCLEATED RED BLOOD CELLS: Lab: 0

## 2020-03-27 MED ADMIN — belatacept (NULOJIX) 412.5 mg in sodium chloride (NS) 0.9 % 100 mL IVPB: 5 mg/kg | INTRAVENOUS | @ 15:00:00 | Stop: 2020-03-27

## 2020-03-27 NOTE — Unmapped (Signed)
Pt present here for Belatacept. Denies any recent fever/chills or contact with sick persons. VSS. Pt has had this medicine in the past w/o any issues. IV 24 G obtained on RAC . Necessary blood work and urine collected prior infusion. Call bell within reach    1035 Belatacept infusion initiated at this time    1105 Infusion completed. VSS. Iv flushed per protocol and d/c'd. Pt denies any complaints. Pt left clinic in stable condition

## 2020-03-28 LAB — CMV DNA, QUANTITATIVE, PCR: CMV VIRAL LD: DETECTED — AB

## 2020-03-28 LAB — CMV COMMENT: Lab: 0

## 2020-03-29 LAB — HEPATITIS B CORE TOTAL ANTIBODY: Hepatitis B virus core Ab:PrThr:Pt:Ser/Plas:Ord:IA: NONREACTIVE

## 2020-03-29 LAB — HEPATITIS B CORE ANTIBODY, TOTAL: HEPATITIS B CORE TOTAL ANTIBODY: NONREACTIVE

## 2020-03-30 NOTE — Unmapped (Signed)
Patient's calcitrol is too soon to bill to the insurance until tomorrow (03/31/20). Spoke with pt and reschedule delivery of all medications for 03/31/20 via same day courier.

## 2020-03-31 MED FILL — OMEPRAZOLE 40 MG CAPSULE,DELAYED RELEASE: 30 days supply | Qty: 30 | Fill #4 | Status: AC

## 2020-03-31 MED FILL — MYFORTIC 180 MG TABLET,DELAYED RELEASE: ORAL | 30 days supply | Qty: 240 | Fill #10

## 2020-03-31 MED FILL — CALCITRIOL 0.25 MCG CAPSULE: ORAL | 30 days supply | Qty: 30 | Fill #4

## 2020-03-31 MED FILL — OMEPRAZOLE 40 MG CAPSULE,DELAYED RELEASE: ORAL | 30 days supply | Qty: 30 | Fill #4

## 2020-03-31 MED FILL — PREDNISONE 2.5 MG TABLET: ORAL | 30 days supply | Qty: 60 | Fill #6

## 2020-03-31 MED FILL — MYFORTIC 180 MG TABLET,DELAYED RELEASE: 30 days supply | Qty: 240 | Fill #10 | Status: AC

## 2020-03-31 MED FILL — CALCITRIOL 0.25 MCG CAPSULE: 30 days supply | Qty: 30 | Fill #4 | Status: AC

## 2020-03-31 MED FILL — PREDNISONE 2.5 MG TABLET: 30 days supply | Qty: 60 | Fill #6 | Status: AC

## 2020-04-20 NOTE — Unmapped (Signed)
[facility] Refill Coordination Note    Specialty Medication(s) to be Shipped:   Transplant: Myfortic 180mg  and Prednisone 2.5mg     Other medication(s) to be shipped: calcitriol 0.28mcg and omeprazole 40mg      Kimberly [name], DOB: 05-24-1981  Phone: [phone] (home)       All above HIPAA information was verified with patient.     Was a Nurse, learning disability used for this call? No    Completed refill call assessment today to schedule patient's medication shipment from the [facility] [phone]).       Specialty medication(s) and dose(s) confirmed: Regimen is correct and unchanged.   Changes to medications: Kimberly [name] reports no changes at this time.  Changes to insurance: No  Questions for the pharmacist: No    Confirmed patient received Welcome Packet with first shipment. The patient will receive a drug information handout for each medication shipped and additional FDA Medication Guides as required.       DISEASE/MEDICATION-SPECIFIC INFORMATION        N/A    SPECIALTY MEDICATION ADHERENCE     Medication Adherence    Patient reported X missed doses in the last month: 0  Specialty Medication: Myfortic 180mg   Patient is on additional specialty medications: Yes  Additional Specialty Medications: Prednisone 2.5mg   Patient Reported Additional Medication X Missed Doses in the Last Month: 0  Patient is on more than two specialty medications: No        Myfortic 180 mg: 7 days of medicine on hand   Prednisone 2.5 mg: 7 days of medicine on hand     SHIPPING     Shipping address confirmed in Epic.     Delivery Scheduled: Yes, Expected medication delivery date: 04/29/2020.     Medication will be delivered via UPS to the prescription address in Epic WAM.    Kimberly [name] [facility] Specialty Technician

## 2020-04-24 ENCOUNTER — Ambulatory Visit: Admit: 2020-04-24 | Discharge: 2020-04-25 | Payer: MEDICARE

## 2020-04-24 LAB — URINALYSIS
BILIRUBIN UA: NEGATIVE
GLUCOSE UA: NEGATIVE
KETONES UA: NEGATIVE
LEUKOCYTE ESTERASE UA: NEGATIVE
PROTEIN UA: NEGATIVE
RBC UA: 18 /HPF — ABNORMAL HIGH (ref <4)
SPECIFIC GRAVITY UA: <=1.005 (ref 1.005–1.030)
SQUAMOUS EPITHELIAL: <1 /HPF (ref 0–5)
UROBILINOGEN UA: 0.2
WBC UA: <1 /HPF (ref 0–5)

## 2020-04-24 LAB — CO2: Carbon dioxide:SCnc:Pt:Ser/Plas:Qn:: 26.7

## 2020-04-24 LAB — CBC W/ AUTO DIFF
BASOPHILS ABSOLUTE COUNT: 0.1 10*9/L (ref 0.0–0.1)
BASOPHILS RELATIVE PERCENT: 0.9 %
EOSINOPHILS ABSOLUTE COUNT: 0.1 10*9/L (ref 0.0–0.7)
EOSINOPHILS RELATIVE PERCENT: 1.8 %
HEMOGLOBIN: 10.9 g/dL — ABNORMAL LOW (ref 12.0–15.5)
LYMPHOCYTES ABSOLUTE COUNT: 1.3 10*9/L (ref 0.7–4.0)
LYMPHOCYTES RELATIVE PERCENT: 23.5 %
MEAN CORPUSCULAR HEMOGLOBIN: 25 pg — ABNORMAL LOW (ref 26.0–34.0)
MEAN CORPUSCULAR VOLUME: 78.5 fL — ABNORMAL LOW (ref 82.0–98.0)
MONOCYTES ABSOLUTE COUNT: 0.7 10*9/L (ref 0.1–1.0)
MONOCYTES RELATIVE PERCENT: 13.4 %
NEUTROPHILS ABSOLUTE COUNT: 3.4 10*9/L (ref 1.7–7.7)
NEUTROPHILS RELATIVE PERCENT: 60.4 %
PLATELET COUNT: 362 10*9/L (ref 150–450)
RED BLOOD CELL COUNT: 4.37 10*12/L (ref 3.90–5.03)
RED CELL DISTRIBUTION WIDTH: 15.6 % — ABNORMAL HIGH (ref 12.0–15.0)
WBC ADJUSTED: 5.6 10*9/L (ref 3.5–10.5)

## 2020-04-24 LAB — COMPREHENSIVE METABOLIC PANEL
ALBUMIN: 3.9 g/dL (ref 3.4–5.0)
ALT (SGPT): 41 U/L (ref 10–49)
ANION GAP: 6 mmol/L (ref 3–11)
AST (SGOT): 17 U/L (ref <34)
BILIRUBIN TOTAL: 0.6 mg/dL (ref 0.3–1.2)
BLOOD UREA NITROGEN: 18 mg/dL (ref 9–23)
BUN / CREAT RATIO: 11
CALCIUM: 10 mg/dL (ref 8.7–10.4)
CHLORIDE: 102 mmol/L (ref 98–107)
CO2: 26.7 mmol/L (ref 20.0–31.0)
CREATININE: 1.58 mg/dL — ABNORMAL HIGH (ref 0.50–0.80)
EGFR CKD-EPI AA FEMALE: 47 mL/min/{1.73_m2}
GLUCOSE RANDOM: 97 mg/dL (ref 70–179)
POTASSIUM: 3.4 mmol/L — ABNORMAL LOW (ref 3.5–5.1)
PROTEIN TOTAL: 6.8 g/dL (ref 5.7–8.2)
SODIUM: 135 mmol/L (ref 135–145)

## 2020-04-24 LAB — MAGNESIUM: Magnesium:MCnc:Pt:Ser/Plas:Qn:: 1.8

## 2020-04-24 LAB — BACTERIA

## 2020-04-24 LAB — CREATININE, URINE: Lab: 26.3

## 2020-04-24 LAB — PROTEIN / CREATININE RATIO, URINE: CREATININE, URINE: 26.3 mg/dL

## 2020-04-24 LAB — MEAN PLATELET VOLUME: Platelet mean volume:EntVol:Pt:Bld:Qn:Automated count: 6.1 — ABNORMAL LOW

## 2020-04-24 MED ADMIN — belatacept (NULOJIX) 425 mg in sodium chloride (NS) 0.9 % 100 mL IVPB: 5 mg/kg | INTRAVENOUS | @ 15:00:00 | Stop: 2020-04-24

## 2020-04-24 NOTE — Unmapped (Signed)
[phone] Pt is here for the Belatacept infusion, denies any changes from the last treatment, is AAO x3 upon arrival, vitals checked and call bell at the chair side and pt instructed to use it when needed, pt verbalizes understanding  1007 IV #24 initiated to the rt aca and flushed, no pre-meds   1037 IV Belatacept 425 mg/ 100 ml started to infuse over 30 min  1110 Iv infusion done and flushed, denies any s/s of allergic reaction, tolerated the infusion well  1113 Iv discontinued and dressing applied, pt given follow up instructions and is stable upon discharge

## 2020-04-25 LAB — CMV DNA, QUANTITATIVE, PCR: CMV VIRAL LD: NOT DETECTED

## 2020-04-25 LAB — CMV QUANT: Lab: 0

## 2020-04-28 MED FILL — OMEPRAZOLE 40 MG CAPSULE,DELAYED RELEASE: 30 days supply | Qty: 30 | Fill #5 | Status: AC

## 2020-04-28 MED FILL — PREDNISONE 2.5 MG TABLET: ORAL | 30 days supply | Qty: 60 | Fill #7

## 2020-04-28 MED FILL — MYFORTIC 180 MG TABLET,DELAYED RELEASE: ORAL | 30 days supply | Qty: 240 | Fill #11

## 2020-04-28 MED FILL — PREDNISONE 2.5 MG TABLET: 30 days supply | Qty: 60 | Fill #7 | Status: AC

## 2020-04-28 MED FILL — OMEPRAZOLE 40 MG CAPSULE,DELAYED RELEASE: ORAL | 30 days supply | Qty: 30 | Fill #5

## 2020-04-28 MED FILL — MYFORTIC 180 MG TABLET,DELAYED RELEASE: 30 days supply | Qty: 240 | Fill #11 | Status: AC

## 2020-04-30 MED FILL — CALCITRIOL 0.25 MCG CAPSULE: 30 days supply | Qty: 30 | Fill #5 | Status: AC

## 2020-04-30 MED FILL — CALCITRIOL 0.25 MCG CAPSULE: ORAL | 30 days supply | Qty: 30 | Fill #5

## 2020-05-06 ENCOUNTER — Encounter: Admit: 2020-05-06 | Discharge: 2020-05-08 | Payer: MEDICARE

## 2020-05-06 ENCOUNTER — Ambulatory Visit: Admit: 2020-05-06 | Discharge: 2020-05-07 | Payer: MEDICARE

## 2020-05-06 DIAGNOSIS — Z79899 Other long term (current) drug therapy: Principal | ICD-10-CM

## 2020-05-06 DIAGNOSIS — Z940 Kidney transplant status: Principal | ICD-10-CM

## 2020-05-06 LAB — CBC W/ AUTO DIFF
BASOPHILS ABSOLUTE COUNT: 0 10*9/L (ref 0.0–0.1)
BASOPHILS RELATIVE PERCENT: 0.4 %
BASOPHILS RELATIVE PERCENT: 0.3 %
EOSINOPHILS ABSOLUTE COUNT: 0 10*9/L (ref 0.0–0.7)
EOSINOPHILS ABSOLUTE COUNT: 0.2 10*9/L (ref 0.0–0.4)
EOSINOPHILS RELATIVE PERCENT: 0.2 %
EOSINOPHILS RELATIVE PERCENT: 1 %
HEMATOCRIT: 34.2 % — ABNORMAL LOW (ref 35.0–44.0)
HEMATOCRIT: 36.4 % (ref 36.0–46.0)
HEMOGLOBIN: 11.1 g/dL — ABNORMAL LOW (ref 12.0–15.5)
HEMOGLOBIN: 11.8 g/dL — ABNORMAL LOW (ref 12.0–16.0)
LARGE UNSTAINED CELLS: 1 % (ref 0–4)
LYMPHOCYTES ABSOLUTE COUNT: 1.2 10*9/L (ref 0.7–4.0)
LYMPHOCYTES ABSOLUTE COUNT: 1.7 10*9/L (ref 1.5–5.0)
LYMPHOCYTES RELATIVE PERCENT: 7.1 %
LYMPHOCYTES RELATIVE PERCENT: 9.8 %
MEAN CORPUSCULAR HEMOGLOBIN CONC: 32.4 g/dL (ref 30.0–36.0)
MEAN CORPUSCULAR HEMOGLOBIN CONC: 32.5 g/dL (ref 31.0–37.0)
MEAN CORPUSCULAR HEMOGLOBIN: 25.1 pg — ABNORMAL LOW (ref 26.0–34.0)
MEAN CORPUSCULAR HEMOGLOBIN: 26.1 pg (ref 26.0–34.0)
MEAN CORPUSCULAR VOLUME: 77.3 fL — ABNORMAL LOW (ref 82.0–98.0)
MEAN CORPUSCULAR VOLUME: 80.3 fL (ref 80.0–100.0)
MEAN PLATELET VOLUME: 6.1 fL — ABNORMAL LOW (ref 7.0–10.0)
MEAN PLATELET VOLUME: 8.9 fL (ref 7.0–10.0)
MONOCYTES ABSOLUTE COUNT: 1 10*9/L (ref 0.1–1.0)
MONOCYTES RELATIVE PERCENT: 6.1 %
MONOCYTES RELATIVE PERCENT: 4.8 %
NEUTROPHILS ABSOLUTE COUNT: 14.6 10*9/L — ABNORMAL HIGH (ref 1.7–7.7)
NEUTROPHILS ABSOLUTE COUNT: 14.2 10*9/L — ABNORMAL HIGH (ref 2.0–7.5)
NEUTROPHILS RELATIVE PERCENT: 86.2 %
NEUTROPHILS RELATIVE PERCENT: 83.2 %
NUCLEATED RED BLOOD CELLS: 0 /100{WBCs} (ref <=4)
PLATELET COUNT: 363 10*9/L (ref 150–450)
PLATELET COUNT: 347 10*9/L (ref 150–440)
RED BLOOD CELL COUNT: 4.53 10*12/L (ref 4.00–5.20)
RED CELL DISTRIBUTION WIDTH: 15.4 % — ABNORMAL HIGH (ref 12.0–15.0)
RED CELL DISTRIBUTION WIDTH: 14.7 % (ref 12.0–15.0)
WBC ADJUSTED: 17.1 10*9/L — ABNORMAL HIGH (ref 4.5–11.0)

## 2020-05-06 LAB — URINALYSIS
BILIRUBIN UA: NEGATIVE
GLUCOSE UA: NEGATIVE
KETONES UA: NEGATIVE
LEUKOCYTE ESTERASE UA: NEGATIVE
NITRITE UA: NEGATIVE
PH UA: 6 (ref 5.0–9.0)
PROTEIN UA: NEGATIVE
RBC UA: 1 /HPF (ref <4)
SPECIFIC GRAVITY UA: <=1.005 (ref 1.005–1.040)
SQUAMOUS EPITHELIAL: 1 /HPF (ref 0–5)
UROBILINOGEN UA: 0.2
WBC UA: <1 /HPF (ref 0–5)

## 2020-05-06 LAB — BASIC METABOLIC PANEL
ANION GAP: 5 mmol/L (ref 5–14)
BLOOD UREA NITROGEN: 19 mg/dL (ref 9–23)
BUN / CREAT RATIO: 12
CALCIUM: 9.8 mg/dL (ref 8.7–10.4)
CHLORIDE: 100 mmol/L (ref 98–107)
CO2: 28.5 mmol/L (ref 20.0–31.0)
CREATININE: 1.62 mg/dL — ABNORMAL HIGH
EGFR CKD-EPI AA FEMALE: 46 mL/min/{1.73_m2} — ABNORMAL LOW (ref >=60)
GLUCOSE RANDOM: 106 mg/dL (ref 70–179)
POTASSIUM: 3.2 mmol/L — ABNORMAL LOW (ref 3.4–4.5)
SODIUM: 133 mmol/L — ABNORMAL LOW (ref 135–145)

## 2020-05-06 LAB — COMPREHENSIVE METABOLIC PANEL
ALBUMIN: 4.3 g/dL (ref 3.4–5.0)
ALKALINE PHOSPHATASE: 84 U/L (ref 46–116)
ALT (SGPT): 28 U/L (ref 10–49)
ANION GAP: 9 mmol/L (ref 5–14)
AST (SGOT): 17 U/L (ref <=34)
BILIRUBIN TOTAL: 0.8 mg/dL (ref 0.3–1.2)
BLOOD UREA NITROGEN: 16 mg/dL (ref 9–23)
BUN / CREAT RATIO: 10
CALCIUM: 10.1 mg/dL (ref 8.7–10.4)
CHLORIDE: 100 mmol/L (ref 98–107)
CO2: 22 mmol/L (ref 20.0–31.0)
CREATININE: 1.63 mg/dL — ABNORMAL HIGH
EGFR CKD-EPI AA FEMALE: 45 mL/min/{1.73_m2} — ABNORMAL LOW (ref >=60)
EGFR CKD-EPI NON-AA FEMALE: 39 mL/min/{1.73_m2} — ABNORMAL LOW (ref >=60)
GLUCOSE RANDOM: 95 mg/dL (ref 70–179)
POTASSIUM: 3.6 mmol/L (ref 3.4–4.5)
PROTEIN TOTAL: 7.4 g/dL (ref 5.7–8.2)

## 2020-05-06 LAB — URINALYSIS WITH CULTURE REFLEX
BILIRUBIN UA: NEGATIVE
GLUCOSE UA: NEGATIVE
KETONES UA: NEGATIVE
NITRITE UA: NEGATIVE
PROTEIN UA: 30 — AB
RBC UA: 3 /HPF (ref <=4)
SPECIFIC GRAVITY UA: 1.006 (ref 1.003–1.030)
SQUAMOUS EPITHELIAL: <1 /HPF (ref 0–5)
UROBILINOGEN UA: 0.2
WBC UA: 11 /HPF — ABNORMAL HIGH (ref 0–5)

## 2020-05-06 LAB — NITRITE UA: Nitrite:PrThr:Pt:Urine:Ord:Test strip: NEGATIVE

## 2020-05-06 LAB — PREGNANCY TEST URINE: Choriogonadotropin (pregnancy test):PrThr:Pt:Urine:Ord:: NEGATIVE

## 2020-05-06 LAB — SODIUM: Sodium:SCnc:Pt:Ser/Plas:Qn:: 131 — ABNORMAL LOW

## 2020-05-06 LAB — LACTATE BLOOD VENOUS: Lactate:SCnc:Pt:BldV:Qn:: 1

## 2020-05-06 LAB — SPECIFIC GRAVITY UA: Specific gravity:Rden:Pt:Urine:Qn:: <=1.005

## 2020-05-06 LAB — MAGNESIUM: Magnesium:MCnc:Pt:Ser/Plas:Qn:: 1.8

## 2020-05-06 LAB — MICROCYTES

## 2020-05-06 LAB — CHLORIDE: Chloride:SCnc:Pt:Ser/Plas:Qn:: 100

## 2020-05-06 LAB — PHOSPHORUS: Phosphate:MCnc:Pt:Ser/Plas:Qn:: 2 — ABNORMAL LOW

## 2020-05-06 LAB — PLATELET COUNT: Platelets:NCnc:Pt:Bld:Qn:Automated count: 363

## 2020-05-06 NOTE — Unmapped (Signed)
Patient WBC 17.0,  fever 10,  bodyaches and malaise and Urinalysis without infection. Advised to come to Emergency room for blood cultures and further evaluation.

## 2020-05-06 NOTE — Unmapped (Signed)
Pt presents to ED with CC of fever immunocompromised. Pt states she has a fever and her WBC is elevated. PT Hx of kidney transplant. Advised by nephrology to come to ED for septic workup. Pt states feels a little spacey, intermittent nausea, headache.

## 2020-05-06 NOTE — Unmapped (Addendum)
Patient called and she stated that she is achy and crampy in the stomach.  She stated that she had 3 BMs yesterday which is not typically for her, normally she only has 1 but none of them were diarrhea.  She also stated mild headache and mild nausea but has been able to keep her medications down this am but has not eaten anything yet.  She denies issues with urination.  She stated she last took tylenol last night.  She was told she should go to ER or urgent care.  I suggested that she go to [facility] ER.  Patient voiced understanding and agreement with advice.

## 2020-05-07 LAB — PHOSPHORUS
Phosphate:MCnc:Pt:Ser/Plas:Qn:: 2.5
Phosphate:MCnc:Pt:Ser/Plas:Qn:: 2.9

## 2020-05-07 LAB — CBC
HEMATOCRIT: 31 % — ABNORMAL LOW (ref 36.0–46.0)
MEAN CORPUSCULAR HEMOGLOBIN: 26.1 pg (ref 26.0–34.0)
MEAN CORPUSCULAR VOLUME: 80.2 fL (ref 80.0–100.0)
MEAN PLATELET VOLUME: 7.5 fL (ref 7.0–10.0)
PLATELET COUNT: 334 10*9/L (ref 150–440)
RED BLOOD CELL COUNT: 3.87 10*12/L — ABNORMAL LOW (ref 4.00–5.20)
WBC ADJUSTED: 9.3 10*9/L (ref 4.5–11.0)

## 2020-05-07 LAB — BASIC METABOLIC PANEL
ANION GAP: 7 mmol/L (ref 5–14)
BLOOD UREA NITROGEN: 16 mg/dL (ref 9–23)
CALCIUM: 9.7 mg/dL (ref 8.7–10.4)
CHLORIDE: 104 mmol/L (ref 98–107)
CO2: 27 mmol/L (ref 20.0–31.0)
CREATININE: 1.6 mg/dL — ABNORMAL HIGH
GLUCOSE RANDOM: 92 mg/dL (ref 70–179)
POTASSIUM: 3.4 mmol/L (ref 3.4–4.5)
SODIUM: 138 mmol/L (ref 135–145)

## 2020-05-07 LAB — BUN / CREAT RATIO: Urea nitrogen/Creatinine:MRto:Pt:Ser/Plas:Qn:: 10

## 2020-05-07 LAB — MAGNESIUM: Magnesium:MCnc:Pt:Ser/Plas:Qn:: 1.8

## 2020-05-07 LAB — MEAN PLATELET VOLUME: Platelet mean volume:EntVol:Pt:Bld:Qn:Automated count: 7.5

## 2020-05-07 LAB — CMV COMMENT: Lab: 0

## 2020-05-07 LAB — CMV DNA, QUANTITATIVE, PCR

## 2020-05-07 MED ADMIN — pantoprazole (PROTONIX) EC tablet 40 mg: 40 mg | ORAL | @ 15:00:00

## 2020-05-07 MED ADMIN — lactated ringers bolus 500 mL: 500 mL | INTRAVENOUS | Stop: 2020-05-06

## 2020-05-07 MED ADMIN — aztreonam (AZACTAM) 1 g in sodium chloride 0.9 % (NS) 100 mL IVPB-connector bag: 1 g | INTRAVENOUS | Stop: 2020-05-06

## 2020-05-07 MED ADMIN — aspirin chewable tablet 81 mg: 81 mg | ORAL | @ 15:00:00

## 2020-05-07 MED ADMIN — ferrous sulfate tablet 325 mg: 325 mg | ORAL | @ 15:00:00

## 2020-05-07 MED ADMIN — mycophenolate (MYFORTIC) EC tablet 720 mg: 720 mg | ORAL | @ 03:00:00

## 2020-05-07 MED ADMIN — levofloxacin (LEVAQUIN) 750 mg/150 mL IVPB 750 mg: 750 mg | INTRAVENOUS | @ 10:00:00 | Stop: 2020-05-15

## 2020-05-07 MED ADMIN — calcitrioL (ROCALTROL) capsule 0.25 mcg: .25 ug | ORAL | @ 15:00:00

## 2020-05-07 MED ADMIN — vancomycin (VANCOCIN) 1500 mg in sodium chloride (NS) 0.9 % 500 mL IVPB (premix): 1500 mg | INTRAVENOUS | Stop: 2020-05-06

## 2020-05-07 NOTE — Unmapped (Signed)
Nephrology ([facility]) History & Physical    Assessment & Plan:   Kimberly [name] is a 39 y.o. female with PMHx of ESRD secondary to native disease reflux nephropathy now s/p fourth kidney transplant 12/05/2018, recurrent UTI that presented to [facility] with fevers.     Principal Problem:    Fever  Active Problems:    Kidney replaced by transplant    Anemia    Left arm swelling  Resolved Problems:    * No resolved hospital problems. *    Fever: Patient had temperature of 101F early morning and 7/13 with no fevers since.  No localizing symptoms.  WBC elevated to 17.  Has history of recurrent UTIs, and previously has minimal symptoms usually urine is foul-smelling which had not occurred prior to presentation.  Urinalysis also largely unremarkable, with some small changes compared to previous with small leukocyte esterase, minimal pyuria.  Has had some generalized joint pain and myalgias with fatigue, in the setting of a fever no localizing symptoms supports possible viral etiology.  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  Given vancomycin and aztreonam in the emergency room, allergy to cephalosporins with reported rash and shortness of breath previously to Ancef.  Low concern for skin or soft tissue infection, or other reason to cover gram positives and MRSA.  Will cover for possible UTI as well as other gram-negative organisms, though overall not impressed with urinalysis and symptoms.  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  Baseline creatinine ~1.5-1.6, at baseline on admission at 1.63.  On Myfortic 720 mg twice daily, prednisone 5 mg daily, monthly belatacept.  ???Continue Myfortic 720 mg twice daily, prednisone 5 mg daily  ???Continue calcitriol 0.25 mcg daily  ???Daily BMP, mag, Phos    Hyponatremia: Most likely secondary to chlorthalidone, patient taking regularly. Will hold on admission.    Chronic Medical Conditions:   GERD: Pantoprazole 40 mg daily  Iron deficiency: Continue home ferrous sulfate    Daily Checklist:  Diet: Regular Diet  DVT PPx: Not Indicated - Padua Score <4  Electrolytes: Replete Potassium to >/=4 and Magnesium to >/=2  Code Status: Full Code    Chief Concern:   Fever    Subjective:   HPI:  Kimberly [name] is a 39 y.o. female with PMHx of ESRD secondary to native disease reflux nephropathy now s/p fourth kidney transplant 12/05/2018, recurrent UTI that presented to [facility] with fevers.     Patient reports she felt subjective chills early in the morning on 7/13, checked her temperature around 3 AM and it was 101F.  Continue to have intermittent chills, which stopped around 7 AM.  Since this time patient has not had any subjective chills and has remained afebrile.  Denies any other associated symptoms at this time.  No cough or shortness of breath.  Denies sick contacts.  Denies any loose stools or blood in her stool.  No abdominal pain.  No dysuria, urgency, or suprapubic tenderness.  No new rashes, cuts, or scrapes.  Has baseline left upper extremity swelling which is unchanged.  Has had a headache across her forehead today, no associated photophobia or phonophobia.  No visual changes.  No neck pain.  Has had some myalgias and joint  pain in shoulders, arms, lower back. Has history of recurrent UTIs, typically has foul-smelling urine that precedes an abnormal urinalysis.  Usually has no dysuria or urgency.    Designated Healthcare Decision Maker:  Ms. Allis currently has decisional capacity for healthcare decision-making and is able to designate a surrogate healthcare decision maker. Ms. Goldblatt designated healthcare decision maker(s) is/are Kimberly [name] (the patient's spouse) as denoted by stated patient preference. Allergies:  Ancef [cefazolin], Adhesive tape-silicones, and Demerol [meperidine]    Medications:   Prior to Admission medications    Medication Dose, Route, Frequency   acetaminophen (TYLENOL) 500 MG tablet 1,000 mg, Oral, Every 6 hours PRN   aspirin (ECOTRIN) 81 MG tablet 81 mg, Oral, Daily (standard)   calcitrioL (ROCALTROL) 0.25 MCG capsule 0.25 mcg, Oral, Daily (standard)   carboxymethylcellulose sodium (REFRESH CELLUVISC) 1 % DpGe Instill drops in affected eye(s) as directed as needed.    chlorthalidone (HYGROTON) 25 MG tablet 12.5 mg, Oral, Daily PRN   ferrous sulfate 325 (65 FE) MG tablet 325 mg, Oral, Daily   MYFORTIC 180 mg EC tablet 720 mg, Oral, 2 times a day (standard)   omeprazole (PRILOSEC) 40 MG capsule Take 1 capsule (40 mg total) by mouth once daily   predniSONE (DELTASONE) 2.5 MG tablet 5 mg, Oral, Daily (standard)       Medical History:  Past Medical History:   Diagnosis Date   ??? Bell's palsy    ??? Disease of thyroid gland    ??? ESRD (end stage renal disease) (CMS-HCC)    ??? Nonarteritic ischemic optic neuropathy    ??? Reflux nephropathy        Surgical History:  Past Surgical History:   Procedure Laterality Date   ??? AV FISTULA PLACEMENT Left     Left arm   ??? NEPHRECTOMY Bilateral    ??? PARATHYROIDECTOMY Bilateral 2010   ??? PR TRANSPLANT,PREP CADAVER RENAL GRAFT Left 12/05/2018    Procedure: [facility] STD PREP CAD DONR RENAL ALLOGFT PRIOR TO TRNSPLNT, INCL DISSEC/REM PERINEPH FAT, DIAPH/RTPER ATTAC;  Surgeon: Bufford Lope, MD;  Location: MAIN OR [place];  Service: Transplant   ??? PR TRANSPLANTATION OF KIDNEY Left 12/05/2018    Procedure: RENAL ALLOTRANSPLANTATION, IMPLANTATION OF GRAFT; WITHOUT RECIPIENT NEPHRECTOMY;  Surgeon: Bufford Lope, MD;  Location: MAIN OR [facility];  Service: Transplant   ??? SKIN BIOPSY     ??? SKIN BIOPSY Left     left hand       Family History:   Family History   Problem Relation Age of Onset   ??? Diabetes Father    ??? Kidney disease Father    ??? Cancer Maternal Grandmother    ??? Cancer Maternal Grandfather    ??? Cancer Paternal Grandmother    ??? Cancer Paternal Grandfather    ??? Kidney disease Brother        Social History:  The patient lives with family    Social History     Tobacco Use   ??? Smoking status: Never Smoker   ??? Smokeless tobacco: Never Used   Substance Use Topics   ??? Alcohol use: No   ??? Drug use: No        Review of Systems:  10 systems were reviewed and are negative unless otherwise mentioned in the HPI    Objective:   Physical Exam:  Temp:  [37.2 ??C-37.4 ??C] 37.2 ??C  Heart Rate:  [92-110] 92  SpO2 Pulse:  [79-88] 79  Resp:  [18-20] 18  BP: (107-141)/(70-91) 107/70  SpO2:  [95 %-100 %] 98 %    Gen: NAD, answers questions appropriately  Eyes: Sclera anicteric, EOMI, PERRLA,  HENT: atraumatic, normocephalic, MMM. OP w/o erythema or exudate   Neck: no cervical lymphadenopathy or thyromegaly, no JVD  Heart: RRR, S1, S2, no M/R/G, no chest wall tenderness  Lungs: CTAB, no crackles or wheezes, no use of accessory muscles  Abdomen: Normoactive bowel sounds, soft, NTND, no rebound/guarding, no hepatosplenomegaly  Extremities: Nonpitting LUE edema with patent fistula present, no clubbing, cyanosis: pulses are +2 in bilateral upper and lower extremities.  No lower extremity edema.  Neuro: CN II-XI grossly intact, No focal deficits.  Skin:  No rashes, lesions on clothed exam  Psych: Alert, oriented, normal mood and affect.     Labs/Studies/Imaging:  Labs, Studies, Imaging from the last 24hrs per EMR and personally reviewed

## 2020-05-07 NOTE — Unmapped (Signed)
Re-assess No changes from previous assessment. Pt appears to be resting in bed at this time, no apparent distress. Respiration regular, equal, unlabored.  Bed low and in locked position, with side rails up.  Call bell within reach, belongings at bedside

## 2020-05-07 NOTE — Unmapped (Signed)
Kimberly [name] Medical [facility]  Emergency Department Provider Note      ED Clinical Impression     Final diagnoses:   None       Initial Impression, ED Course, Assessment and Plan     Impression: Kimberly [name] is a 39 y.o. female with a history of multiple kidney transplants who is presenting with fever and leukocytosis.  History as per below.  Briefly, patient reports a fever to 101F earlier today, and because of this obtain routine lab work which showed a white count elevated to 17. Basic lab work there also showed a normal urinalysis. Patient otherwise has been asymptomatic.    On arrival to the emergency department, patient is afebrile, hemodynamically stable although moderately tachycardic, nontachypneic and satting well on room air.  She is alert and oriented no acute distress.  Cardiopulmonary exam is reassuring.  Abdomen soft nondistended, nontender to palpation.  Patient has no suprapubic tenderness.     Patient is a 39 year old female with a history of multiple kidney transplants who is presenting with fever and leukocytosis. She presents primarily at the request of her transplant team. Aside from patient's fever, she has not noticed any symptoms such as cough, shortness of breath, abdominal pain, diarrhea, nausea/vomiting, dysuria or hematuria. It is certainly possible the patient has a bacteremia. We will obtain blood cultures and basic lab work as well as basic lab work. Will plan to treat the patient with fluids and broad-spectrum antibiotics. Given patient's immunocompromised status, we will plan to discuss case with the hospitalist.    ED Triage Vitals   Vital Signs Group      Temp 05/06/20 1349 37.4 ??C (99.3 ??F)      Temp Source 05/06/20 1349 Oral      Heart Rate 05/06/20 1349 110      SpO2 Pulse 05/06/20 1840 88      Heart Rate Source 05/06/20 1715 SpO2      Resp 05/06/20 1349 20      BP 05/06/20 1349 141/85      MAP (mmHg) 05/06/20 1715 103      BP Location 05/06/20 1715 Right arm BP Method 05/06/20 1715 Automatic      Patient Position 05/06/20 1715 Sitting   SpO2 05/06/20 1349 100 %   O2 Flow Rate (L/min) --    O2 Device 05/06/20 1715 None (Room air)            Additional Medical Decision Making     I have reviewed the vital signs and the nursing notes. Labs and radiology results that were available during my care of the patient were independently reviewed by me and considered in my medical decision making.     Case discussed with ED attending Dr. Clinton Sawyer who is agreeable with the plan.    ____________________________________________       History     Chief Complaint  Fever Immunocompromised      HPI   Kimberly [name] is a 39 y.o. female with a history of multiple kidney transplants who is presenting with fever and leukocytosis. Patient reports first noticing fever this morning. She reports a T-max of 101F. She reported the symptoms to her transplant team, who asked that she present for basic lab work to be drawn. Lab work showed a leukocytosis to 17 but otherwise no abnormalities. Urinalysis also taken showed no signs of UTI. She has been without any upper respiratory complaints recently, no cough, no shortness of breath. She additionally denies  any chest pain. She has not had any abdominal pain, diarrhea, nausea/vomiting, dysuria or hematuria. Patient was advised by her transplant team to present to the emergency department after they found the leukocytosis.      Review of Systems:  Constitutional: As above  ENT: no sore throat.  Cardiovascular: no chest pain.  Respiratory: no shortness of breath.  Gastrointestinal: no abdominal pain, no vomiting, no diarrhea.  Genitourinary: no dysuria.    All other systems reviewed and negative unless otherwise noted.    Past Medical History:   Diagnosis Date   ??? Bell's palsy    ??? Disease of thyroid gland    ??? ESRD (end stage renal disease) (CMS-HCC)    ??? Nonarteritic ischemic optic neuropathy    ??? Reflux nephropathy        Patient Active Problem List   Diagnosis   ??? Chronic skin ulcer (CMS-HCC)   ??? Kidney replaced by transplant   ??? Anemia   ??? Ischemic optic neuropathy of both eyes   ??? 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 Closed nondisplaced fracture of lateral malleolus of right fibula   ??? Chronic hypotension   ??? Hyperparathyroidism due to renal insufficiency (CMS-HCC)   ??? ESRD (end stage renal disease) (CMS-HCC)   ??? Bell's palsy   ??? PCT (porphyria cutanea tarda) (CMS-HCC)   ??? Fever   ??? Left arm swelling       Past Surgical History:   Procedure Laterality Date   ??? AV FISTULA PLACEMENT Left     Left arm   ??? NEPHRECTOMY Bilateral    ??? PARATHYROIDECTOMY Bilateral 2010   ??? PR TRANSPLANT,PREP CADAVER RENAL GRAFT Left 12/05/2018    Procedure: [facility] STD PREP CAD DONR RENAL ALLOGFT PRIOR TO TRNSPLNT, INCL DISSEC/REM PERINEPH FAT, DIAPH/RTPER ATTAC;  Surgeon: Bufford Lope, MD;  Location: MAIN OR [facility];  Service: Transplant   ??? PR TRANSPLANTATION OF KIDNEY Left 12/05/2018    Procedure: RENAL ALLOTRANSPLANTATION, IMPLANTATION OF GRAFT; WITHOUT RECIPIENT NEPHRECTOMY;  Surgeon: Bufford Lope, MD;  Location: MAIN OR [facility];  Service: Transplant   ??? SKIN BIOPSY     ??? SKIN BIOPSY Left     left hand         Current Facility-Administered Medications:   ???  acetaminophen (TYLENOL) tablet 650 mg, 650 mg, Oral, Q6H PRN, Mellody Dance, MD  ???  aspirin chewable tablet 81 mg, 81 mg, Oral, Daily, Mellody Dance, MD, 81 mg at 05/07/20 1038  ???  calcitrioL (ROCALTROL) capsule 0.25 mcg, 0.25 mcg, Oral, Daily, Mellody Dance, MD, 0.25 mcg at 05/07/20 1037  ???  carboxymethylcellulose sodium (REFRESH CELLUVISC) 1 % ophthalmic gel 1 drop, 1 drop, Both Eyes, TID PRN, Mellody Dance, MD  ???  ferrous sulfate tablet 325 mg, 325 mg, Oral, Daily, Mellody Dance, MD, 325 mg at 05/07/20 1038  ???  levofloxacin (LEVAQUIN) 750 mg/150 mL IVPB 750 mg, 750 mg, Intravenous, Q48H, Mellody Dance, MD, Stopped at 05/07/20 0703  ???  mycophenolate (MYFORTIC) EC tablet 720 mg, 720 mg, Oral, BID, Mellody Dance, MD, 720 mg at 05/07/20 1038  ???  pantoprazole (PROTONIX) EC tablet 40 mg, 40 mg, Oral, Daily, Mellody Dance, MD, 40 mg at 05/07/20 1038  ???  predniSONE (DELTASONE) tablet 5 mg, 5 mg, Oral, Daily, Mellody Dance, MD, 5 mg at 05/07/20 1038    Current Outpatient Medications:   ???  acetaminophen (TYLENOL) 500 MG tablet, Take 2 tablets (1,000 mg  total) by mouth every six (6) hours as needed for pain., Disp: 100 tablet, Rfl: 0  ???  aspirin (ECOTRIN) 81 MG tablet, Take 1 tablet (81 mg total) by mouth daily., Disp: 30 tablet, Rfl: 11  ???  calcitrioL (ROCALTROL) 0.25 MCG capsule, Take 1 capsule (0.25 mcg total) by mouth daily., Disp: 30 capsule, Rfl: 11  ???  carboxymethylcellulose sodium (REFRESH CELLUVISC) 1 % DpGe, Instill drops in affected eye(s) as directed as needed., Disp: , Rfl:   ???  chlorthalidone (HYGROTON) 25 MG tablet, TAKE 0.5 TABLETS (12.5 MG TOTAL) BY MOUTH DAILY AS NEEDED., Disp: 30 tablet, Rfl: 11  ???  ferrous sulfate 325 (65 FE) MG tablet, Take 325 mg by mouth daily., Disp: , Rfl:   ???  MYFORTIC 180 mg EC tablet, Take 4 tablets (720 mg total) by mouth Two (2) times a day., Disp: 240 tablet, Rfl: 11  ???  omeprazole (PRILOSEC) 40 MG capsule, Take 1 capsule (40 mg total) by mouth once daily, Disp: 30 capsule, Rfl: 11  ???  predniSONE (DELTASONE) 2.5 MG tablet, Take 2 tablets (5 mg total) by mouth daily., Disp: 60 tablet, Rfl: 11    Allergies  Ancef [cefazolin], Adhesive tape-silicones, and Demerol [meperidine]    Family History   Problem Relation Age of Onset   ??? Diabetes Father    ??? Kidney disease Father    ??? Cancer Maternal Grandmother    ??? Cancer Maternal Grandfather    ??? Cancer Paternal Grandmother    ??? Cancer Paternal Grandfather    ??? Kidney disease Brother        Social History  Social History     Tobacco Use   ??? Smoking status: Never Smoker   ??? Smokeless tobacco: Never Used   Substance Use Topics   ??? Alcohol use: No   ??? Drug use: No         Physical Exam     ED Triage Vitals Enc Vitals Group      BP 05/06/20 1349 141/85      Heart Rate 05/06/20 1349 110      SpO2 Pulse 05/06/20 1840 88      Resp 05/06/20 1349 20      Temp 05/06/20 1349 37.4 ??C (99.3 ??F)      Temp Source 05/06/20 1349 Oral      SpO2 05/06/20 1349 100 %      Weight --       Height --       Head Circumference --       Peak Flow --       Pain Score --       Pain Loc --       Pain Edu? --       Excl. in GC? --        Constitutional: Alert and oriented. Well appearing. In no distress.  Eyes: Conjunctivae are normal.  ENT       Head: Normocephalic and atraumatic.       Nose: No congestion.       Mouth/Throat: Mucous membranes are moist.       Neck: No stridor.  Cardiovascular: rate as vital signs, regular rhythm. Normal and symmetric distal pulses are present in all extremities.  Respiratory: Normal respiratory effort, symmetrical expansion of chest. No wheezes, no rales.  Gastrointestinal: Soft and nontender. No rigid abdomen present.  Genitourinary: No suprapubic tenderness  Musculoskeletal: No tenderness or edema of bilateral LEs.  Neurologic: Normal speech and language.  No gross focal neurologic deficits are appreciated.  Skin: Skin is warm, dry and intact. No rash noted.  Psychiatric: Mood and affect are normal. Speech and behavior are normal.    Radiology     XR Chest 1 view Portable   Final Result      No acute airspace disease.          Pertinent labs & imaging results that were available during my care of the patient were reviewed by me and considered in my medical decision making (see chart for details).     Please note- This chart has been created using [employer]. Chart creation errors have been sought, but may not always be located and such creation errors, especially pronoun confusion, do NOT reflect on the standard of medical care.        Orlene Plum, MD  Resident  05/07/20 [phone]

## 2020-05-07 NOTE — Unmapped (Cosign Needed)
Nephrology ([facility]) Progress Note    Assessment & Plan:   Kimberly [name] is a 39 y.o. 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    Principal Problem:    Fever  Active Problems:    Kidney replaced by transplant    Anemia    Left arm swelling  Resolved Problems:    * No resolved hospital problems. *      Fever & Leukocytosis in Immunocompromised Patient  Patient has remained afebrile today with no symptoms. Exam & history did not reveal any sources of infection. WBC decreased to 9.3 from 17.1 yesterday. Our differential includes UTI, CMV viremia, and bacteremia. She has a history of UTIs that usually present as cloudy & odorous urine, though she has not had these symptoms. 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   - Continue levofloxacin 750mg  every 48 hours for possible UTI  - Pending labs: BK, CMV, urine culture, blood culture    ESRD 2/2 reflux nephropathy s/p 4th DDKT 12/05/18:  Last belatacept was 04/24/2020. Creatinine is 1.6, at baseline.  ??? Continue Myfortic 720 mg twice daily, prednisone 5 mg daily  ??? Continue calcitriol 0.25 mcg daily    Anemia  Hemoglobin decreased to 10.1 today from 11.8 yesterday. She has no signs of blood loss or hemolysis. Borderline low MCV 80.2.  She has a history of anemia 2/2 CKD with a baseline Hgb ~11.   - Continue to monitor    Chronic Problems:  GERD: Pantoprazole 40 mg daily  Iron deficiency: Continue home ferrous sulfate    Daily Checklist:  Diet: Regular Diet  DVT PPx: Not Indicated - Padua Score <4  Electrolytes: No Repletion Needed  Code Status: Full Code  Dispo: Transfer to Floor    Team Contact Information:   Primary Team: Nephrology ([facility])  Primary Resident: Oleta Mouse, MD  Resident's Pager: [phone] (Nephrology Intern - Blue)    Interval History:   No acute events overnight. Patient remained afebrile & asymptomatic overnight. Remains stable.    ROS: Denies headache, chest pain, shortness of breath, abdominal pain, nausea, vomiting.    Objective:   Temp:  [36.3 ??C (97.4 ??F)-37.2 ??C (98.9 ??F)] 36.6 ??C (97.8 ??F)  SpO2 Pulse:  [73-90] 87  Resp:  [18-20] 18  BP: (103-134)/(70-80) 127/78  SpO2:  [94 %-99 %] 98 %    Gen: WDWN in NAD, answers questions appropriately  Eyes: sclera anicteric, EOMI  HENT: atraumatic, MMM, OP w/o erythema or exudate   Heart: RRR, S1, S2, no M/R/G, no chest wall tenderness  Lungs: CTAB, no crackles or wheezes, no use of accessory muscles  Abdomen: Normoactive bowel sounds, soft, NTND, no rebound/guarding. No tenderness over transplanted kidneys.  Extremities: no clubbing, cyanosis in the BLEs. Mild edema in BLEs. Significant edema of left upper extremity 2/2 AVF.  Psych: Alert, oriented, appropriate mood and affect    Labs/Studies: Labs and Studies from the last 24hrs per EMR and Reviewed    I attest that I have reviewed the student note and that the components of the history of the present illness, the physical exam, and the assessment and plan documented were performed by me or were performed in my presence by the student where I verified the documentation and performed (or re-performed) the exam and medical decision  making.     Theodis Shove, MD MPH  Internal Medicine, PGY-1

## 2020-05-07 NOTE — Unmapped (Signed)
Rcvd report from off going nurse. Pt on stretcher in room at this time. NAD noted. Will continue to monitor

## 2020-05-07 NOTE — Unmapped (Signed)
Pt is resting on stretcher at this time awaiting bed assignment. NAD noted, no needs at this time. Safety  Checks in place, belongings at side. Bed locked and in low position. Will continue to monitor

## 2020-05-07 NOTE — Unmapped (Signed)
Care Management  Initial Transition Planning Assessment    CM met with patient in pt room.  Pt/visitors were wearing hospital provided masks for the duration of the interaction with CM. CM was wearing hospital provided surgical mask and hospital provided eye protection.  CM was not within 6 foot of the patient/visitors during this interaction.  CM Introduced self and role.    Per Medical Chart Review  39 y.o. 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     CM Summary per patient/ family:     Home: Private two  story residence with no steps to entry  Lives with/ Supports:Spouse Kadasia Kassing   ADL's: Patient is independent with bathing, bathroom, dressing and cooking.  Falls: Patient has had no falls in the last two weeks.  DME: None  Home Health Services:none  DME/HH Agency preference: No preference  Community Services:  Medical Insurance: Verified  Medication: Patient is compliant with medications. Pt denies any barriers to obtaining or taking medications.   Advance Directives: Does not have advance directives. Stated preference for  HCDM Marlane Mingle  Transportation: Family  Other:    Type of Residence: Mailing Address:  [address]  [name] [address]  Contacts: Accompanied by: Alone  Patient Phone Number: [phone] (home)           Medical Provider(s): Estevan Oaks, MD  Reason for Admission: Admitting Diagnosis:  No admission diagnoses are documented for this encounter.  Past Medical History:   has a past medical history of Bell's palsy, Disease of thyroid gland, ESRD (end stage renal disease) (CMS-HCC), Nonarteritic ischemic optic neuropathy, and Reflux nephropathy.  Past Surgical History:   has a past surgical history that includes AV fistula placement (Left); Parathyroidectomy (Bilateral, 2010); Skin biopsy; Skin biopsy (Left); Nephrectomy (Bilateral); pr transplantation of kidney (Left, 12/05/2018); and pr transplant,prep cadaver renal graft (Left, 12/05/2018).   Previous admit date: 12/05/2018    Primary Insurance- Payor: BCBS / Plan: BCBS BLUE OPTIONS/PPO/ADV ([place] ONLY) / Product Type: *No Product type* /   Secondary Insurance ??? Secondary Insurance  MEDICARE  Prescription Coverage ??? Yes  Preferred Pharmacy - [id number] - [place], [place] - [address]  [facility]  [facility]    Transportation home: [facility] Manager assessed the patient by : In person interview with patient, Medical record review  Orientation Level: Oriented X4  Functional level prior to admission: Independent  Reason for referral: Discharge Planning    Contact/Decision Maker  Extended Emergency Contact Information  Primary Emergency Contact: Brickey,Kevin  Address: [address]           [place], [name] [address] [place] Phone: [phone]  Relation: Spouse  Secondary Emergency Contact: Wonda Cerise, [place]  Home Phone: [phone]  Mobile Phone: [phone]  Relation: Relative    Legal Next of Kin / Guardian / POA / Advance Directives     HCDM (patient stated preference): Skarda,Kevin - Spouse - [phone]    Advance Directive (Medical Treatment)  Does patient have an advance directive covering medical treatment?: Patient does not have advance directive covering medical treatment.  Reason patient does not have an advance directive covering medical  treatment:: Patient does not wish to complete one at this time.              Patient Information  Lives with: Spouse/significant other    Type of Residence: Private residence        Location/Detail: [address] [name] [address]    Support Systems/Concerns: Spouse    Responsibilities/Dependents at home?: Yes (Describe) (25 yo daughter)    Home Care services in place prior to admission?: No          Outpatient/Community Resources in place prior to admission: Clinic  Agency detail (Name/Phone #): PCP  Dr. Estevan Oaks    Equipment Currently Used at Home: none       Currently receiving outpatient dialysis?: No       Financial Information       Need for financial assistance?: No       Social Determinants of Health  Social Determinants of Health were addressed in provider documentation.  Please refer to patient history.  Social Determinants of Health     Tobacco Use: Low Risk    ??? Smoking Tobacco Use: Never Smoker   ??? Smokeless Tobacco Use: Never Used   Alcohol Use:    ??? How often do you have 5 or more drinks on one occasion?:    ??? How many drinks containing alcohol do you have on a typical day when you are drinking?:    ??? How often do you have a drink containing alcohol?:    Financial Resource Strain: Low Risk    ??? Difficulty of Paying Living Expenses: Not hard at all   Food Insecurity: No Food Insecurity   ??? Worried About Programme researcher, broadcasting/film/video in the Last Year: Never true   ??? Ran Out of Food in the Last Year: Never true   Transportation Needs: No Transportation Needs   ??? Lack of Transportation (Medical): No   ??? Lack of Transportation (Non-Medical): No   Physical Activity:    ??? Days of Exercise per Week:    ??? Minutes of Exercise per Session:    Stress:    ??? Feeling of Stress :    Social Connections:    ??? Frequency of Communication with Friends and Family:    ??? Frequency of Social Gatherings with Friends and Family:    ??? Attends Religious Services:    ??? Database administrator or Organizations:    ??? Attends Engineer, structural:    ??? Marital Status:    Intimate Programme researcher, broadcasting/film/video Violence:    ??? Fear of Current or Ex-Partner:    ??? Emotionally Abused:    ??? Physically Abused:    ??? Sexually Abused:    Depression:    ??? PHQ-2 Score:    Housing Stability: Low Risk    ??? Within the past 12 months, have you ever stayed: outside, in a car, in a tent, in an overnight shelter, or temporarily in someone else's home (i.e. couch-surfing)?: No   ??? Are you worried about losing your housing?: No   ??? Within the past 12 months, have you been unable to get utilities (heat, electricity) when it was really needed?: No   Substance Use:    ??? Taken prescription drugs for non-medical reasons:    ??? Taken illegal drugs:    ??? Patient indicated they have taken drugs in the past year, including Cannabis, Cocaine, Prescription stimulants, Methamphetamine, Inahalnts, Sedatives or sleeping pills, Hallucinogens, Street Opioids or Prescription opiods  for non-medical reasons:    Health Literacy:    ??? :        Discharge Needs Assessment  Concerns to be Addressed: no discharge needs identified    Clinical Risk Factors:           Prior overnight hospital stay or ED visit in last 90 days: No    Readmission Within the Last 30 Days: no previous admission in last 30 days         Anticipated Changes Related to Illness: none    Equipment Needed After Discharge: none    Discharge Facility/Level of Care Needs:      Readmission  Risk of Unplanned Readmission Score:  %  Predictive Model Details   No score data available for [facility] Risk of Unplanned Readmission     Readmitted Within the Last 30 Days? (No if blank)   Patient at risk for readmission?: No    Discharge Plan  Screen findings are: Care Manager reviewed the plan of the patient's care with the Multidisciplinary Team. No discharge planning needs identified at this time. Care Manager will continue to manage plan and monitor patient's progress with the team.    Expected Discharge Date:     Expected Transfer from Critical Care:      Quality data for continuing care services shared with patient and/or representative?: N/A          Initial Assessment complete?: Yes

## 2020-05-08 LAB — BASIC METABOLIC PANEL
ANION GAP: 7 mmol/L (ref 5–14)
BLOOD UREA NITROGEN: 22 mg/dL (ref 9–23)
BUN / CREAT RATIO: 13
CALCIUM: 10.2 mg/dL (ref 8.7–10.4)
CHLORIDE: 103 mmol/L (ref 98–107)
CO2: 25 mmol/L (ref 20.0–31.0)
CREATININE: 1.7 mg/dL — ABNORMAL HIGH
EGFR CKD-EPI AA FEMALE: 43 mL/min/{1.73_m2} — ABNORMAL LOW (ref >=60)
GLUCOSE RANDOM: 95 mg/dL (ref 70–179)
POTASSIUM: 3.8 mmol/L (ref 3.4–4.5)
SODIUM: 135 mmol/L (ref 135–145)

## 2020-05-08 LAB — CBC
HEMATOCRIT: 32.1 % — ABNORMAL LOW (ref 36.0–46.0)
HEMOGLOBIN: 10.8 g/dL — ABNORMAL LOW (ref 12.0–16.0)
MEAN CORPUSCULAR HEMOGLOBIN: 26.8 pg (ref 26.0–34.0)
MEAN CORPUSCULAR VOLUME: 80.1 fL (ref 80.0–100.0)
MEAN PLATELET VOLUME: 7.2 fL (ref 7.0–10.0)
PLATELET COUNT: 404 10*9/L (ref 150–440)
RED BLOOD CELL COUNT: 4.01 10*12/L (ref 4.00–5.20)
RED CELL DISTRIBUTION WIDTH: 14.9 % (ref 12.0–15.0)

## 2020-05-08 LAB — SODIUM: Sodium:SCnc:Pt:Ser/Plas:Qn:: 135

## 2020-05-08 LAB — HEMATOCRIT: Hematocrit:VFr:Pt:Bld:Qn:: 32.1 — ABNORMAL LOW

## 2020-05-08 LAB — PHOSPHORUS: Phosphate:MCnc:Pt:Ser/Plas:Qn:: 3.1

## 2020-05-08 LAB — MAGNESIUM: Magnesium:MCnc:Pt:Ser/Plas:Qn:: 1.9

## 2020-05-08 MED ORDER — ASPIRIN 81 MG TABLET,DELAYED RELEASE
ORAL_TABLET | Freq: Every day | ORAL | 0 refills | 30 days | Status: CP
Start: 2020-05-08 — End: 2020-06-07

## 2020-05-08 MED ADMIN — predniSONE (DELTASONE) tablet 5 mg: 5 mg | ORAL | @ 13:00:00 | Stop: 2020-05-08

## 2020-05-08 NOTE — Unmapped (Signed)
Physician Discharge Summary [facility]  3 [facility]  [address]  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 [facility] Course:   Fever:   Patient had temperature of 101F early morning and 7/13 with no fevers since.  No localizing symptoms.  WBC elevated to 17.  Has history of recurrent UTIs, and previously has minimal symptoms usually urine is foul-smelling which had not occurred prior to presentation.  Urinalysis also largely unremarkable, with some small changes compared to previous with small leukocyte esterase, minimal pyuria.  Has had some generalized joint pain and myalgias with fatigue, in the setting of a fever no localizing symptoms supports possible viral etiology.  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  Given vancomycin and aztreonam in the emergency room, allergy to cephalosporins with reported rash and shortness of breath previously to Ancef.  Low concern for skin or soft tissue infection, or other reason to cover gram positives and MRSA. Respiratory pathogen panel negative. Chest x-ray clear.  Covered for possible UTI as well as other gram-negative organisms, though overall not impressed with urinalysis and symptoms.  Patient has remained afebrile since admission with no symptoms. Exam & history did not reveal any sources of infection. WBC decreased to 9.3 from 17.1 on second day. Her blood cultures are negative at 24h but urine cultures were too young to read. She will leave on an oral antibiotic but we will contact her if cultures come back positive.     ??  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  Baseline creatinine ~1.5-1.6, at baseline on admission at 1.63.  On Myfortic 720 mg twice daily, prednisone 5 mg daily, monthly belatacept. We also continued her home continue calcitriol 0.25 mcg daily    Hyponatremia: Most likely secondary to chlorthalidone, patient taking regularly. Will hold on admission. Resolved soon after admission.   ??  Chronic Medical Conditions:   GERD: Pantoprazole 40 mg daily  Iron deficiency: Continued home ferrous sulfate      Outpatient Provider Follow Up Issues:   [  ] Follow-up for recurrence of symptoms  [  ] Repeat BMP in 1 wk for hyponatremia  [  ] Repeat UA before next Belatacept infusion    ______________________________________________________________________  Discharge Medications:     Your Medication List      START taking these medications    ciprofloxacin HCl 250 MG tablet  Commonly known as: CIPRO  Take 2 tablets (500 mg total) by mouth daily for 5 days.  Start taking on: May 09, 2020        CONTINUE taking these medications  acetaminophen 500 MG tablet  Commonly known as: TYLENOL  Take 2 tablets (1,000 mg total) by mouth every six (6) hours as needed for pain.     aspirin 81 MG tablet  Commonly known as: ECOTRIN  Take 1 tablet (81 mg total) by mouth daily.     calcitrioL 0.25 MCG capsule  Commonly known as: ROCALTROL  Take 1 capsule (0.25 mcg total) by mouth daily.     carboxymethylcellulose sodium 1 % Dpge  Commonly known as: REFRESH CELLUVISC  Instill drops in affected eye(s) as directed as needed.     chlorthalidone 25 MG tablet  Commonly known as: HYGROTON  TAKE 0.5 TABLETS (12.5 MG TOTAL) BY MOUTH DAILY AS NEEDED.     ferrous sulfate 325 (65 FE) MG tablet  Take 325 mg by mouth daily.     MYFORTIC 180 MG EC tablet  Generic drug: mycophenolate  Take 4 tablets (720 mg total) by mouth Two (2) times a day.     omeprazole 40 MG capsule  Commonly known as: PriLOSEC  Take 1 capsule (40 mg total) by mouth once daily     pravastatin 20 MG tablet  Commonly known as: PRAVACHOL  Take 20 mg by mouth daily.     predniSONE 2.5 MG tablet  Commonly known as: DELTASONE  Take 2 tablets (5 mg total) by mouth daily.            Allergies:  Ancef [cefazolin], Adhesive tape-silicones, and Demerol [meperidine]  ______________________________________________________________________  Pending Test Results:  Pending Labs     Order Current Status    Blood Culture #1 Preliminary result    Blood Culture #2 Preliminary result          Most Recent Labs:  All lab results last 24 hours -   Recent Labs   Lab Units 05/08/20  0550 05/07/20  0509 05/06/20  1832 05/06/20  1819   WBC 10*9/L 7.4 9.3  --  17.1*   RBC 10*12/L 4.01 3.87*  --  4.53   HEMOGLOBIN g/dL 40.1* 02.7*  --  25.3*   HEMATOCRIT % 32.1* 31.0*  --  36.4   MCV fL 80.1 80.2  --  80.3   MCH pg 26.8 26.1  --  26.1   MCHC g/dL 66.4 40.3  --  47.4   RDW % 14.9 14.8  --  14.7   PLATELET COUNT (1) 10*9/L 404 334  --  347   MPV fL 7.2 7.5  --  8.9   HUMAN METAPNEUMOVIRUS PCR   --   --  Not Detected  --        Recent Labs   Lab Units 05/08/20  0549 05/07/20  0509 05/06/20  1819   SODIUM mmol/L 135 138 131*   POTASSIUM mmol/L 3.8 3.4 3.6   CHLORIDE mmol/L 103 104 100   CO2 mmol/L 25.0 27.0 22.0   BUN mg/dL 22 16 16    CREATININE mg/dL 2.59* 5.63* 8.75*   EGFR CKD-EPI AA FEMALE mL/min/1.29m2 43* 46* 45*   EGFR CKD-EPI NON-AA FEMALE mL/min/1.41m2 37* 40* 39*   GLUCOSE mg/dL 95 92 95   CALCIUM mg/dL 64.3 9.7 32.9   ALBUMIN g/dL  --   --  4.3   PROTEIN TOTAL g/dL  --   --  7.4   BILIRUBIN TOTAL mg/dL  --   --  0.8   ALK PHOS U/L  --   --  84   ALT U/L  --   --  28  AST U/L  --   --  17       Relevant Studies/Radiology:  ECG 12 Lead    Result Date: 05/07/2020  NORMAL SINUS RHYTHM NORMAL ECG WHEN COMPARED WITH ECG OF 06-Dec-2018 14:38, NO SIGNIFICANT CHANGE WAS FOUND Confirmed by Pollyann Kennedy ([id number]) on 05/07/2020 11:19:48 PM    XR Chest 1 view Portable    Result Date: 05/06/2020  EXAM: XR CHEST PORTABLE DATE: 05/06/2020 7:13 PM ACCESSION: [id number] UN DICTATED: 05/06/2020 7:20 PM INTERPRETATION LOCATION: Main [facility] CLINICAL INDICATION: 39 years old Female with infection w/u ; OTHER  COMPARISON: 12/16/2019 and priors. TECHNIQUE: Portable Chest Radiograph. FINDINGS: Surgical clips in the upper abdomen. Left axillary vascular stent. Lungs radiographically clear. No pleural effusion or pneumothorax. Stable cardiomediastinal silhouette.     No acute airspace disease.    ______________________________________________________________________  Discharge Instructions:   Activity Instructions     Activity as tolerated                     Follow Up instructions and Outpatient Referrals     Call MD for:  difficulty breathing, headache or visual disturbances      Call MD for:  persistent nausea or vomiting      Call MD for:  severe uncontrolled pain      Call MD for:  temperature >38.5 Celsius      Discharge instructions      You were admitted to [facility] with fever and elevated white blood cell count. We did not find any source of your infection , and we will therefore be treating you for 5 more days of antibiotics to cover for a possible urinary tract infection and any other sources of infection we may have missed.     You are now ready to discharge and will be discharging to: Home    Please take your medications as prescribed. Medication changes are listed below and a full list of medications is in this discharge packet. Please keep your follow-up appointments after the hospital for ongoing care. It has been a pleasure taking care of you, we wish you the best.     MEDICATION CHANGES:  -- start ciprofloxacin 500mg  once a day for 5 days starting 7/17 FOLLOW-UP:  -- It is important to see your primary care provider (PCP) after being in the hospital. Your PCP is Estevan Oaks, MD and the phone number of their office is [phone].   -- Please follow up for your next belatacept infusion on 7/30 , and please also ask Dr. Nestor Lewandowsky to get a repeat urinalysis and urine culture at that visit . We will also relay these recommendations to her          Appointments which have been scheduled for you    May 22, 2020 10:00 AM  (Arrive by 9:45 AM)  INFUSION-BELATACEPT with UNCTIF 01, UNCTIF RN 3  [facility] TH[facility]APEUTIC INFUSION [facility] [place] [facility]) [address]  [name] [address]  [phone]      Jun 19, 2020 10:00 AM  (Arrive by 9:45 AM)  INFUSION-BELATACEPT with UNCTIF 01, UNCTIF RN 3  [facility] TH[facility]IC IN[facility]ION [facility] [place] [facility]) [address]  [name] [address]  [phone]      Jun 19, 2020  1:00 PM  (Arrive by 12:45 PM)  RETURN NEPHROLOGY POST with Leeroy Bock, MD  [facility]ANS[facility] [place]) [address]  [name] [address]  [phone]      Jul 17, 2020 10:00 AM  (Arrive by 9:45 AM)  INFUSION-BELATACEPT with Donato Schultz, UNCTIF RN 3  [facility] TH[facility]APEUTIC INF[facility]ION [facility] [place] [facility]) [address]  [name] [address]  [phone]      Aug 14, 2020 10:00 AM  (Arrive by 9:45 AM)  INFUSION-BELATACEPT with Donato Schultz, UNCTIF RN 3  [facility] TH[facility]APEUTIC INFUSION [facility] [place] [facility]) [address]  [name] [address]  [phone]      Sep 11, 2020 10:00 AM  (Arrive by 9:45 AM)  INFUSION-BELATACEPT with UNCTIF 01, UNCTIF RN 3  [facility] TH[facility]APEUTIC INFUSION [facility] [place]) [address]  [name] [address]  [phone]      Oct 09, 2020 10:00 AM  (Arrive by 9:45 AM)  INFUSION-BELATACEPT with UNCTIF 01, UNCTIF RN 3  [facility] TH[facility]APE[facility]IC INFUSION [facility] [place] [facility]) [address]  [name] [address]  [phone]           ______________________________________________________________________  Discharge Day Services:  BP 144/94  - Pulse 87  - Temp 35.7 ??C (Oral)  - Resp 18  - Ht 162.6 cm (5' 4)  - Wt 83.6 kg (184 lb 6.4 oz)  - SpO2 100%  - BMI 31.65 kg/m??     Pt seen on the day of discharge and determined appropriate for discharge.    Condition at Discharge: good    Length of Discharge: I spent greater than 30 mins in the discharge of this patient.

## 2020-05-08 NOTE — Unmapped (Addendum)
Fever:   Patient had temperature of 101F early morning and 7/13 with no fevers since.  No localizing symptoms.  WBC elevated to 17.  Has history of recurrent UTIs, and previously has minimal symptoms usually urine is foul-smelling which had not occurred prior to presentation.  Urinalysis also largely unremarkable, with some small changes compared to previous with small leukocyte esterase, minimal pyuria.  Has had some generalized joint pain and myalgias with fatigue, in the setting of a fever no localizing symptoms supports possible viral etiology.  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  Given vancomycin and aztreonam in the emergency room, allergy to cephalosporins with reported rash and shortness of breath previously to Ancef.  Low concern for skin or soft tissue infection, or other reason to cover gram positives and MRSA. Respiratory pathogen panel negative. Chest x-ray clear.  Covered for possible UTI as well as other gram-negative organisms, though overall not impressed with urinalysis and symptoms.  Patient has remained afebrile since admission with no symptoms. Exam & history did not reveal any sources of infection. WBC decreased to 9.3 from 17.1 on second day. Her blood cultures are negative at 24h but urine cultures were too young to read. She will leave on an oral antibiotic but we will contact her if cultures come back positive.     ??  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  Baseline creatinine ~1.5-1.6, at baseline on admission at 1.63.  On Myfortic 720 mg twice daily, prednisone 5 mg daily, monthly belatacept. We also continued her home continue calcitriol 0.25 mcg daily    Hyponatremia: Most likely secondary to chlorthalidone, patient taking regularly. Will hold on admission. Resolved soon after admission.   ??  Chronic Medical Conditions:   GERD: Pantoprazole 40 mg daily  Iron deficiency: Continued home ferrous sulfate

## 2020-05-08 NOTE — Unmapped (Signed)
Discharge teaching and instructions provided to the patient at the bedside, she verbalizes understanding. Patient denies any pain or distress, last vital signs stable. Patient's husband will pick her up in front of the hospital for transportation home. Patient belongings at the bedside.   Problem: Adult Inpatient Plan of Care  Goal: Plan of Care Review  Outcome: Resolved  Goal: Patient-Specific Goal (Individualization)  Outcome: Resolved  Goal: Absence of Hospital-Acquired Illness or Injury  Outcome: Resolved  Goal: Optimal Comfort and Wellbeing  Outcome: Resolved  Goal: Readiness for Transition of Care  Outcome: Resolved  Goal: Rounds/Family Conference  Outcome: Resolved     Problem: Wound  Goal: Optimal Wound Healing  Outcome: Resolved     Problem: Fever  Goal: Body Temperature in Desired Range  Outcome: Resolved     Problem: Infection  Goal: Infection Symptom Resolution  Outcome: Resolved

## 2020-05-08 NOTE — Unmapped (Signed)
Patient A&Ox4, VSS. Patient denied SOB, chest pain, N/V/D. Patient ambulated in room independently. Pt on continuous pulse and oxygen >92% on RA. Patient had BM during shift. Patient was free from falls, call bell and bedside table within reach. Will continue to monitor.   Problem: Adult Inpatient Plan of Care  Goal: Plan of Care Review  Outcome: Progressing  Goal: Patient-Specific Goal (Individualization)  Outcome: Progressing  Goal: Absence of Hospital-Acquired Illness or Injury  Outcome: Progressing  Goal: Optimal Comfort and Wellbeing  Outcome: Progressing  Goal: Readiness for Transition of Care  Outcome: Progressing  Goal: Rounds/Family Conference  Outcome: Progressing     Problem: Wound  Goal: Optimal Wound Healing  Outcome: Progressing     Problem: Fever  Goal: Body Temperature in Desired Range  Outcome: Progressing     Problem: Infection  Goal: Infection Symptom Resolution  Outcome: Progressing

## 2020-05-08 NOTE — Unmapped (Signed)
Pt admitted from ED. Pt A&O, VSS, denies pain. Admission screening's completed. Pt up ad lib. Pt was free from falls or injury this shift. Bed in low and locked position. Call bell and tray table within reach. Will continue to monitor.     Problem: Adult Inpatient Plan of Care  Goal: Plan of Care Review  Outcome: Progressing  Goal: Patient-Specific Goal (Individualization)  Outcome: Progressing  Goal: Absence of Hospital-Acquired Illness or Injury  Outcome: Progressing  Goal: Optimal Comfort and Wellbeing  Outcome: Progressing  Goal: Readiness for Transition of Care  Outcome: Progressing  Goal: Rounds/Family Conference  Outcome: Progressing     Problem: Wound  Goal: Optimal Wound Healing  Outcome: Progressing     Problem: Fever  Goal: Body Temperature in Desired Range  Outcome: Progressing     Problem: Infection  Goal: Infection Symptom Resolution  Outcome: Progressing

## 2020-05-09 MED ORDER — CIPROFLOXACIN 250 MG TABLET
ORAL_TABLET | Freq: Every day | ORAL | 0 refills | 5.00000 days | Status: CP
Start: 2020-05-09 — End: 2020-05-14

## 2020-05-12 LAB — BK VIRUS QUANTITATIVE PCR, BLOOD: BK BLOOD RESULT: NOT DETECTED

## 2020-05-12 LAB — BK BLOOD QUANT: Lab: 0

## 2020-05-22 ENCOUNTER — Ambulatory Visit: Admit: 2020-05-22 | Discharge: 2020-05-23 | Payer: MEDICARE

## 2020-05-22 DIAGNOSIS — Z940 Kidney transplant status: Principal | ICD-10-CM

## 2020-05-22 LAB — CBC W/ AUTO DIFF
BASOPHILS ABSOLUTE COUNT: 0.1 10*9/L (ref 0.0–0.1)
BASOPHILS RELATIVE PERCENT: 1.2 %
EOSINOPHILS ABSOLUTE COUNT: 0.1 10*9/L (ref 0.0–0.7)
EOSINOPHILS RELATIVE PERCENT: 2 %
HEMATOCRIT: 33.8 % — ABNORMAL LOW (ref 35.0–44.0)
HEMOGLOBIN: 10.8 g/dL — ABNORMAL LOW (ref 12.0–15.5)
MEAN CORPUSCULAR HEMOGLOBIN CONC: 31.9 g/dL (ref 30.0–36.0)
MEAN CORPUSCULAR HEMOGLOBIN: 24.8 pg — ABNORMAL LOW (ref 26.0–34.0)
MEAN CORPUSCULAR VOLUME: 77.8 fL — ABNORMAL LOW (ref 82.0–98.0)
MEAN PLATELET VOLUME: 6.6 fL — ABNORMAL LOW (ref 7.0–10.0)
MONOCYTES ABSOLUTE COUNT: 0.8 10*9/L (ref 0.1–1.0)
MONOCYTES RELATIVE PERCENT: 11.2 %
NEUTROPHILS ABSOLUTE COUNT: 4.5 10*9/L (ref 1.7–7.7)
NEUTROPHILS RELATIVE PERCENT: 63.1 %
RED BLOOD CELL COUNT: 4.35 10*12/L (ref 3.90–5.03)
RED CELL DISTRIBUTION WIDTH: 15.2 % — ABNORMAL HIGH (ref 12.0–15.0)
WBC ADJUSTED: 7.1 10*9/L (ref 3.5–10.5)

## 2020-05-22 LAB — COMPREHENSIVE METABOLIC PANEL
ALBUMIN: 3.6 g/dL (ref 3.4–5.0)
ALKALINE PHOSPHATASE: 66 U/L (ref 46–116)
ALT (SGPT): 41 U/L (ref 10–49)
AST (SGOT): 44 U/L — ABNORMAL HIGH (ref <=34)
BILIRUBIN TOTAL: 0.5 mg/dL (ref 0.3–1.2)
CALCIUM: 9 mg/dL (ref 8.7–10.4)
CHLORIDE: 104 mmol/L (ref 98–107)
CO2: 25.8 mmol/L (ref 20.0–31.0)
CREATININE: 1.42 mg/dL — ABNORMAL HIGH
EGFR CKD-EPI AA FEMALE: 54 mL/min/{1.73_m2} — ABNORMAL LOW (ref >=60)
EGFR CKD-EPI NON-AA FEMALE: 47 mL/min/{1.73_m2} — ABNORMAL LOW (ref >=60)
GLUCOSE RANDOM: 81 mg/dL (ref 70–179)
PROTEIN TOTAL: 6.4 g/dL (ref 5.7–8.2)
SODIUM: 136 mmol/L (ref 135–145)

## 2020-05-22 LAB — SPECIFIC GRAVITY UA: Specific gravity:Rden:Pt:Urine:Qn:: <=1.005

## 2020-05-22 LAB — URINALYSIS
BACTERIA: NONE SEEN /HPF
BILIRUBIN UA: NEGATIVE
GLUCOSE UA: NEGATIVE
KETONES UA: NEGATIVE
LEUKOCYTE ESTERASE UA: NEGATIVE
NITRITE UA: NEGATIVE
PH UA: 6 (ref 5.0–9.0)
PROTEIN UA: NEGATIVE
RBC UA: <1 /HPF (ref <4)
SPECIFIC GRAVITY UA: <=1.005 (ref 1.005–1.030)
SQUAMOUS EPITHELIAL: 2 /HPF (ref 0–5)
UROBILINOGEN UA: 0.2

## 2020-05-22 LAB — GLUCOSE RANDOM: Glucose:MCnc:Pt:Ser/Plas:Qn:: 81

## 2020-05-22 LAB — MAGNESIUM: Magnesium:MCnc:Pt:Ser/Plas:Qn:: 1.5 — ABNORMAL LOW

## 2020-05-22 LAB — LYMPHOCYTES ABSOLUTE COUNT: Lymphocytes:NCnc:Pt:Bld:Qn:Automated count: 1.6

## 2020-05-22 LAB — PHOSPHORUS: Phosphate:MCnc:Pt:Ser/Plas:Qn:: 2.7

## 2020-05-22 MED ORDER — MYFORTIC 180 MG TABLET,DELAYED RELEASE
ORAL_TABLET | Freq: Two times a day (BID) | ORAL | 11 refills | 30 days | Status: CP
Start: 2020-05-22 — End: 2021-05-22
  Filled 2020-05-27: qty 240, 30d supply, fill #0

## 2020-05-22 MED ADMIN — belatacept (NULOJIX) 425 mg in sodium chloride (NS) 0.9 % 100 mL IVPB: 5 mg/kg | INTRAVENOUS | @ 15:00:00 | Stop: 2020-05-22

## 2020-05-22 NOTE — Unmapped (Signed)
[facility] Refill Coordination Note    Specialty Medication(s) to be Shipped:   Transplant: Myfortic 180mg  and Prednisone 2.5mg    **Sent rf request for myfortic**    Other medication(s) to be shipped: calcitriol 0.75mcg and omeprazole 40mg      Kimberly [name], DOB: [birth year]  Phone: [phone] (home)       All above HIPAA information was verified with patient.     Was a Nurse, learning disability used for this call? No    Completed refill call assessment today to schedule patient's medication shipment from the [facility] [phone]).       Specialty medication(s) and dose(s) confirmed: Regimen is correct and unchanged.   Changes to medications: Kimberly [name] reports no changes at this time.  Changes to insurance: No  Questions for the pharmacist: No    Confirmed patient received Welcome Packet with first shipment. The patient will receive a drug information handout for each medication shipped and additional FDA Medication Guides as required.       DISEASE/MEDICATION-SPECIFIC INFORMATION        N/A    SPECIALTY MEDICATION ADHERENCE     Medication Adherence    Patient reported X missed doses in the last month: 0  Specialty Medication: Myfortic 180mg   Patient is on additional specialty medications: Yes  Additional Specialty Medications: Prednisone 2.5mg   Patient Reported Additional Medication X Missed Doses in the Last Month: 0  Patient is on more than two specialty medications: No        Myfortic 180 mg: 7 days of medicine on hand   Prednisone 2.5 mg: 7 days of medicine on hand     SHIPPING     Shipping address confirmed in Epic.     Delivery Scheduled: Yes, Expected medication delivery date: 05/28/2020.     Medication will be delivered via UPS to the prescription address in Epic WAM.    Kimberly [name]

## 2020-05-22 NOTE — Unmapped (Signed)
Pt present here for Belatacept. Denies any recent fever/chills or contact with sick persons. VSS. Pt has had this medicine in the past w/o any issues. IV 24 G obtained on RAC . Necessary blood work and urine collected prior infusion. Call bell within reach  ??  1039 Belatacept infusion initiated at this time  ??  1109 Infusion completed. VSS. Iv flushed per protocol and d/c'd. Pt denies any complaints. Pt left clinic in stable condition

## 2020-05-22 NOTE — Unmapped (Signed)
Standing orders entered in Belatacept therapy plan

## 2020-05-24 LAB — CMV DNA, QUANTITATIVE, PCR

## 2020-05-24 LAB — CMV QUANT: Lab: 0

## 2020-05-27 MED FILL — OMEPRAZOLE 40 MG CAPSULE,DELAYED RELEASE: 30 days supply | Qty: 30 | Fill #6 | Status: AC

## 2020-05-27 MED FILL — PREDNISONE 2.5 MG TABLET: 30 days supply | Qty: 60 | Fill #8 | Status: AC

## 2020-05-27 MED FILL — PREDNISONE 2.5 MG TABLET: ORAL | 30 days supply | Qty: 60 | Fill #8

## 2020-05-27 MED FILL — OMEPRAZOLE 40 MG CAPSULE,DELAYED RELEASE: ORAL | 30 days supply | Qty: 30 | Fill #6

## 2020-05-27 MED FILL — MYFORTIC 180 MG TABLET,DELAYED RELEASE: 30 days supply | Qty: 240 | Fill #0 | Status: AC

## 2020-05-30 MED ORDER — ASPIRIN 81 MG TABLET,DELAYED RELEASE
ORAL_TABLET | 0 refills | 0 days
Start: 2020-05-30 — End: ?

## 2020-06-01 MED FILL — CALCITRIOL 0.25 MCG CAPSULE: ORAL | 30 days supply | Qty: 30 | Fill #6

## 2020-06-01 MED FILL — CALCITRIOL 0.25 MCG CAPSULE: 30 days supply | Qty: 30 | Fill #6 | Status: AC

## 2020-06-02 MED ORDER — ASPIRIN 81 MG TABLET,DELAYED RELEASE
ORAL_TABLET | 0 refills | 0 days
Start: 2020-06-02 — End: ?

## 2020-06-15 DIAGNOSIS — Z79899 Other long term (current) drug therapy: Principal | ICD-10-CM

## 2020-06-15 DIAGNOSIS — Z940 Kidney transplant status: Principal | ICD-10-CM

## 2020-06-19 ENCOUNTER — Encounter: Admit: 2020-06-19 | Discharge: 2020-06-19 | Payer: MEDICARE

## 2020-06-19 ENCOUNTER — Encounter: Admit: 2020-06-19 | Discharge: 2020-06-19 | Payer: MEDICARE | Attending: Nephrology | Primary: Nephrology

## 2020-06-19 DIAGNOSIS — Z940 Kidney transplant status: Principal | ICD-10-CM

## 2020-06-19 LAB — CBC W/ AUTO DIFF
BASOPHILS ABSOLUTE COUNT: 0 10*9/L (ref 0.0–0.1)
BASOPHILS RELATIVE PERCENT: 0.7 %
EOSINOPHILS ABSOLUTE COUNT: 0.1 10*9/L (ref 0.0–0.7)
EOSINOPHILS RELATIVE PERCENT: 1.2 %
HEMATOCRIT: 33.2 % — ABNORMAL LOW (ref 35.0–44.0)
HEMOGLOBIN: 10.6 g/dL — ABNORMAL LOW (ref 12.0–15.5)
LYMPHOCYTES ABSOLUTE COUNT: 1.2 10*9/L (ref 0.7–4.0)
LYMPHOCYTES RELATIVE PERCENT: 18.6 %
MEAN CORPUSCULAR HEMOGLOBIN CONC: 32 g/dL (ref 30.0–36.0)
MEAN CORPUSCULAR HEMOGLOBIN: 24.8 pg — ABNORMAL LOW (ref 26.0–34.0)
MEAN CORPUSCULAR VOLUME: 77.3 fL — ABNORMAL LOW (ref 82.0–98.0)
MEAN PLATELET VOLUME: 6.4 fL — ABNORMAL LOW (ref 7.0–10.0)
MONOCYTES ABSOLUTE COUNT: 0.8 10*9/L (ref 0.1–1.0)
MONOCYTES RELATIVE PERCENT: 11.7 %
NEUTROPHILS ABSOLUTE COUNT: 4.5 10*9/L (ref 1.7–7.7)
NEUTROPHILS RELATIVE PERCENT: 67.8 %
PLATELET COUNT: 390 10*9/L (ref 150–450)
RED BLOOD CELL COUNT: 4.3 10*12/L (ref 3.90–5.03)
RED CELL DISTRIBUTION WIDTH: 14.6 % (ref 12.0–15.0)
WBC ADJUSTED: 6.6 10*9/L (ref 3.5–10.5)

## 2020-06-19 LAB — URINALYSIS
BACTERIA: NONE SEEN /HPF
BILIRUBIN UA: NEGATIVE
GLUCOSE UA: NEGATIVE
KETONES UA: NEGATIVE
LEUKOCYTE ESTERASE UA: NEGATIVE
NITRITE UA: NEGATIVE
PH UA: 6 (ref 5.0–9.0)
PROTEIN UA: NEGATIVE
RBC UA: <1 /HPF (ref <4)
SPECIFIC GRAVITY UA: <=1.005 (ref 1.005–1.030)
SQUAMOUS EPITHELIAL: 2 /HPF (ref 0–5)
TRANSITIONAL EPITHELIAL: 1 /HPF (ref 0–2)
UROBILINOGEN UA: 0.2
WBC UA: <1 /HPF (ref 0–5)

## 2020-06-19 LAB — BK VIRUS QUANTITATIVE PCR, BLOOD: BK BLOOD RESULT: NOT DETECTED

## 2020-06-19 LAB — MAGNESIUM: MAGNESIUM: 1.8 mg/dL (ref 1.6–2.6)

## 2020-06-19 LAB — COMPREHENSIVE METABOLIC PANEL
ALBUMIN: 4 g/dL (ref 3.4–5.0)
ALKALINE PHOSPHATASE: 78 U/L (ref 46–116)
ALT (SGPT): 40 U/L (ref 10–49)
ANION GAP: 5 mmol/L (ref 5–14)
AST (SGOT): 22 U/L (ref <=34)
BILIRUBIN TOTAL: 0.6 mg/dL (ref 0.3–1.2)
BLOOD UREA NITROGEN: 23 mg/dL (ref 9–23)
BUN / CREAT RATIO: 16
CALCIUM: 10.4 mg/dL (ref 8.7–10.4)
CHLORIDE: 100 mmol/L (ref 98–107)
CO2: 29 mmol/L (ref 20.0–31.0)
CREATININE: 1.4 mg/dL — ABNORMAL HIGH
EGFR CKD-EPI AA FEMALE: 54 mL/min/{1.73_m2} — ABNORMAL LOW (ref >=60)
EGFR CKD-EPI NON-AA FEMALE: 47 mL/min/{1.73_m2} — ABNORMAL LOW (ref >=60)
GLUCOSE RANDOM: 90 mg/dL (ref 70–179)
POTASSIUM: 3.1 mmol/L — ABNORMAL LOW (ref 3.4–4.5)
PROTEIN TOTAL: 7 g/dL (ref 5.7–8.2)
SODIUM: 134 mmol/L — ABNORMAL LOW (ref 135–145)

## 2020-06-19 LAB — PHOSPHORUS: PHOSPHORUS: 2.9 mg/dL (ref 2.4–5.1)

## 2020-06-19 MED ORDER — SERTRALINE 50 MG TABLET
ORAL_TABLET | Freq: Every day | ORAL | 2 refills | 30.00000 days | Status: CP
Start: 2020-06-19 — End: 2021-06-19

## 2020-06-19 MED ORDER — EZETIMIBE 10 MG TABLET
ORAL_TABLET | Freq: Every day | ORAL | 3 refills | 90 days | Status: CP
Start: 2020-06-19 — End: 2021-06-19

## 2020-06-19 MED ADMIN — belatacept (NULOJIX) 437.5 mg in sodium chloride (NS) 0.9 % 100 mL IVPB: 5 mg/kg | INTRAVENOUS | @ 14:00:00 | Stop: 2020-06-19

## 2020-06-19 NOTE — Unmapped (Addendum)
0945 Patient in today for Belatacept infusion. Patient has no s/s of infection. No issues from the last infusion.Labs drwn and sent to lab.  1024 Belatacept started ,patient instructed to use call bell /call nurse for any s/s of unsual symptoms during infusion. Patient educated on possible s/s of reaction,such as chestpain ,itching,shortness of breath lightheadedness and any kind of discomfort. Patient verbalized understanding.  1055 Infusion completed and well tolerated.  1115 Belatacept 437.5 mg/100 ml NS infused over 30 min. per protocol . Pt alert and oriented, offered no complaints during infusion. IV flushed with 10ml NS post infusion. Pt tolerated infusion well.

## 2020-06-19 NOTE — Unmapped (Signed)
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  2nd kidney transplant - 2000-2002 - DD failed chronic rejection.  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 Her last dialysis was on 03/07/19.  Has a good U.O.    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    Creatinine - value 1.40, stable  Urine analysis: protein negative / WBC <1 / RBC <1 .  Urine protein/creatinine ratio: 0.335  DSA: positive  B7 - 1091 - 02/28/20    Last CMV checked: Not detected 06/19/20  Last BKV checked: Not detected 06/19/20        2) Immunosuppression: Myfortic 720 mg BID / Prednisone 5 mg daily / Belatacept monthly (last on 06/19/20)  - Changes in Immunosuppression - none   - On belatacept due to biopsy findings.  - Medications side effects: No     3) UTI  - History of recent recurrent UTI's. No UTI symptoms today.   - Previously referred to urogyn given risk of future antibiotic resistance    4) Hypotension - Controlled - off midodrine now.  Goal for B.P > 100 systolic.   Changes in B.P medications - none.    5) Hypertension - BP 141/88 in clinic today.   - Occasional high fluctuations at home; could be anxiety induced     6) Anemia - stable - Hgb 10.6  Goal for Hemoglobin > 11.0  Ferritin 14.6 (09/04/19)   - Continue iron supplement once daily. Pt is not comfortable with increasing on iron supplement dose. She will work on incorporating iron-rich foods into her diet.   - Will start taking Vit C supplements     7) MBD - Calcium -WNL/ Phosphorus 2.9 - Secondary hyperparathyroidism  On calcitriol 0.25 mcg daily    8) Electrolytes: Mild Hypokalemia - monitor for now.    9) Leg swelling   - On diuretic (chlorthalidone) 12.5mg  PRN.     10) Immunizations:  Influenza (inactivated only): received 07/2019  Pneumococcal vaccination (inactivated only): will ask patient  Covid-19 [employer]): 02/05/20. 01/15/20. Will return to clinic in two week for COVID booster.    11) Cancer screening:  PAP smear - 11/06/19 negative   Mammogram - Age 59.    12) Left UE swelling - patient following up in lymphedema clinic every 6 months.     13) High Cholesterol - Pt was previously on pravastatin 20 mg daily, but d/t all over muscle twitching discontinued it three weeks later.   Start Zetia 10 mg daily next week    14) Anxiety   -Pt previously endorsed severe night sweats while on Celexa   -Start Zoloft 50 mg daily.   - Pt to report back to me if no improvement in mood in 3-4 weeks.    Follow up: 3 months or sooner if indicated.    History of  Presenting Illness:   Since last seen, patient continues to receive belatacept infusions (on 02/28/20, 03/27/20, 04/24/20, and 05/22/20).    Patient was admitted from 05/06/20-05/08/20 for chief complaint of fever. WBC elevated to 17 on admission. Given vancomycin and aztreonam in the ER. Respiratory pathology panel negative. Chest XR clear. Exam and history did not reveal any sources for infection. Discharged on ciprofloxacin 500 mg daily x 5 days.     She presents today with increased feelings of anxiety d/t her child starting school this week. She has been very emotional lately and small things bother her. She also reports occasional increased fluctuations in her BP. She states discontinuing her pravastatin after 3 weeks d/t all over muscle twitching that started in the face. Endorses trace LE swelling. Denies chest pain or tightness nuasea, vomiting fevers chills headaches lightheadedness and dizziness      Diabetes: No   HTN: Yes: Hypotension    Controlled:Yes   Adherence      With Medication: yes    With Follow up: yes    Functional Status: independent    Patient was found to have reflux nephropathy at age 55 and was on peritoneal dialysis briefly before receiving a living related renal transplant from mother. She had some thrombotic complication and underwent nephrectomy in 1997. She started dialysis and had a second kidney transplant at age 2 in 27. 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 She received a 3rd kidney transplant in 11/2002 which failed in 2007 due to chronic rejection.    She has decreased vision both eyes.    Review of Systems:     Fever or chills: negative   Sore throat: negative   Fatigue/malaise: negative   Weight loss or gain: negative   New skin rash/lump or bump: negative   Problems with teeth/gums: negative   Chest pain: negative   Cough or shortness of breath: negative   Swelling: +Leg swelling.  Abdominal pain/heartburn/nausea/vomiting or diarrhea: negative   Pain or bleeding when urinating: negative   Twitching/numbness or weakness: negative     Physical Exam:     Vital signs: BP 141/88 (BP Site: R Arm, BP Position: Sitting, BP Cuff Size: Medium)  - Pulse 84  - Temp 36.3 ??C (97.3 ??F) (Temporal)  - Ht 162.6 cm (5' 4)  - Wt 86.3 kg (190 lb 3.2 oz)  - BMI 32.65 kg/m??    Nursing note and vitals reviewed.   Constitutional: Oriented to person, place, and time. Appears well-developed and well-nourished. No distress.   HENT: Patient wearing mask  Head: Normocephalic and atraumatic.   Eyes: Right eye exhibits no discharge. Left eye exhibits no discharge. No scleral icterus.   Neck: wearing mask  Cardiovascular: Normal rate and regular rhythm.  No murmur heard.   Pulmonary/Chest: Effort normal and breath sounds normal. No respiratory distress.   Abdominal: Soft. Non tender  Musculoskeletal: Normal range of motion. Trace edema B/L L.E.    Neurological: Alert and oriented to person, place, and time.   Psychiatric: Normal mood and affect.     Renal Transplant History:    Race:  Caucasian   Age of recipient (at time of transplant): 39 y.o.   Cause of kidney disease: Reflux nephropathy   Native biopsy: No    Date of transplant: 12/05/18   Type of transplant: KDPI - 39%    - Donor creatinine: 1.3    - Any co morbidities: No    -  Infection in donor: No    - HLA Mismatch: N/A    - Ischemia time: 6h 1m    - Crossmatch: negative    - Donor kidney biopsy: No diagnostic abnormalities identified (12/05/18)     Induction: Camp[place] - alemtuzumab   Maintenance IS at the time of transplant: tacrolimus, mycophenolate   DGF: Yes   Diabetes Onset after transplant: No    Allergies:   Allergies   Allergen Reactions   ??? Ancef [Cefazolin] Shortness Of Breath   ??? Adhesive Tape-Silicones      Other reaction(s): Other (See Comments)  Uncoded Allergy. Allergen: plastic tape, Other Reaction: blistering   ??? Demerol [Meperidine] Nausea And Vomiting        Current Medications:   Current Outpatient Medications   Medication Sig Dispense Refill   ??? acetaminophen (TYLENOL) 500 MG tablet Take 2 tablets (1,000 mg total) by mouth every six (6) hours as needed for pain. 100 tablet 0   ??? aspirin (ECOTRIN) 81 MG tablet Take 1 tablet (81 mg total) by mouth daily. 30 tablet 0   ??? calcitrioL (ROCALTROL) 0.25 MCG capsule Take 1 capsule (0.25 mcg total) by mouth daily. 30 capsule 11   ??? carboxymethylcellulose sodium (REFRESH CELLUVISC) 1 % DpGe Instill drops in affected eye(s) as directed as needed.     ??? chlorthalidone (HYGROTON) 25 MG tablet TAKE 0.5 TABLETS (12.5 MG TOTAL) BY MOUTH DAILY AS NEEDED. 30 tablet 11   ??? ezetimibe (ZETIA) 10 mg tablet Take 1 tablet (10 mg total) by mouth daily. 90 tablet 3   ??? ferrous sulfate 325 (65 FE) MG tablet Take 325 mg by mouth daily.     ??? MYFORTIC 180 mg EC tablet Take 4 tablets (720 mg total) by mouth Two (2) times a day. 240 tablet 11   ??? omeprazole (PRILOSEC) 40 MG capsule Take 1 capsule (40 mg total) by mouth once daily 30 capsule 11   ??? predniSONE (DELTASONE) 2.5 MG tablet Take 2 tablets (5 mg total) by mouth daily. 60 tablet 11   ??? sertraline (ZOLOFT) 50 MG tablet Take 1 tablet (50 mg total) by mouth daily. 30 tablet 2     No current facility-administered medications for this visit.       Past Medical History:   Past Medical History:   Diagnosis Date   ??? Bell's palsy    ??? Disease of thyroid gland    ??? ESRD (end stage renal disease) (CMS-HCC)    ??? Nonarteritic ischemic optic neuropathy    ??? Reflux nephropathy         Laboratory studies:     Recent Results (from the past 170 hour(s))   Urinalysis    Collection Time: 06/19/20  9:55 AM   Result Value Ref Range    Color, UA Yellow     Clarity, UA Clear     Specific Gravity, UA <=1.005 1.005 - 1.030    pH, UA 6.0 5.0 - 9.0    Leukocyte Esterase, UA Negative Negative    Nitrite, UA Negative Negative    Protein, UA Negative Negative    Glucose, UA Negative Negative    Ketones, UA Negative Negative    Urobilinogen, UA 0.2 mg/dL 0.2 - 2.0 mg/dL    Bilirubin, UA Negative Negative    Blood, UA Moderate (A) Negative    RBC, UA <1 <4 /HPF    WBC, UA <1 0 - 5 /HPF    Squam Epithel, UA 2 0 - 5 /HPF  Bacteria, UA None Seen None Seen /HPF    Trans Epithel, UA 1 0 - 2 /HPF   Comprehensive Metabolic Panel    Collection Time: 06/19/20  9:56 AM   Result Value Ref Range    Sodium 134 (L) 135 - 145 mmol/L    Potassium 3.1 (L) 3.4 - 4.5 mmol/L    Chloride 100 98 - 107 mmol/L    Anion Gap 5 5 - 14 mmol/L    CO2 29.0 20.0 - 31.0 mmol/L    BUN 23 9 - 23 mg/dL    Creatinine 1.61 (H) 0.60 - 0.80 mg/dL    BUN/Creatinine Ratio 16     EGFR CKD-EPI Non-African American, Female 47 (L) >=60 mL/min/1.33m2    EGFR CKD-EPI African American, Female 54 (L) >=60 mL/min/1.69m2    Glucose 90 70 - 179 mg/dL    Calcium 09.6 8.7 - 04.5 mg/dL    Albumin 4.0 3.4 - 5.0 g/dL    Total Protein 7.0 5.7 - 8.2 g/dL    Total Bilirubin 0.6 0.3 - 1.2 mg/dL    AST 22 <=40 U/L    ALT 40 10 - 49 U/L    Alkaline Phosphatase 78 46 - 116 U/L   Phosphorus Level    Collection Time: 06/19/20  9:56 AM   Result Value Ref Range    Phosphorus 2.9 2.4 - 5.1 mg/dL   Magnesium Level    Collection Time: 06/19/20  9:56 AM   Result Value Ref Range    Magnesium 1.8 1.6 - 2.6 mg/dL   CBC w/ Differential    Collection Time: 06/19/20  9:56 AM   Result Value Ref Range    WBC 6.6 3.5 - 10.5 10*9/L    RBC 4.30 3.90 - 5.03 10*12/L    HGB 10.6 (L) 12.0 - 15.5 g/dL    HCT 98.1 (L) 19.1 - 44.0 %    MCV 77.3 (L) 82.0 - 98.0 fL    MCH 24.8 (L) 26.0 - 34.0 pg    MCHC 32.0 30.0 - 36.0 g/dL    RDW 47.8 29.5 - 62.1 %    MPV 6.4 (L) 7.0 - 10.0 fL    Platelet 390 150 - 450 10*9/L    Neutrophils % 67.8 %    Lymphocytes % 18.6 %    Monocytes % 11.7 %    Eosinophils % 1.2 %    Basophils % 0.7 %    Absolute Neutrophils 4.5 1.7 - 7.7 10*9/L    Absolute Lymphocytes 1.2 0.7 - 4.0 10*9/L    Absolute Monocytes 0.8 0.1 - 1.0 10*9/L    Absolute Eosinophils 0.1 0.0 - 0.7 10*9/L    Absolute Basophils 0.0 0.0 - 0.1 10*9/L     Scribe's Attestation: Leeroy Bock, MD obtained and performed the history, physical exam and medical decision making elements that were entered into the chart.  Signed by Sharion Balloon, Scribe, on June 19, 2020 at 4:31 PM.    Documentation assistance provided by the Scribe. I was present during the time the encounter was recorded. The information recorded by the Scribe was done at my direction and has been reviewed and validated by me.

## 2020-06-19 NOTE — Unmapped (Signed)
[facility] Clinical Assessment & Refill Coordination Note    Kimberly [name], DOB: 1980/12/10  Phone: [phone] (home)     All above HIPAA information was verified with patient.     Was a Nurse, learning disability used for this call? No    Specialty Medication(s):   Transplant: Myfortic 180mg  and Prednisone 2.5mg      Current Outpatient Medications   Medication Sig Dispense Refill   ??? acetaminophen (TYLENOL) 500 MG tablet Take 2 tablets (1,000 mg total) by mouth every six (6) hours as needed for pain. 100 tablet 0   ??? aspirin (ECOTRIN) 81 MG tablet Take 1 tablet (81 mg total) by mouth daily. 30 tablet 0   ??? calcitrioL (ROCALTROL) 0.25 MCG capsule Take 1 capsule (0.25 mcg total) by mouth daily. 30 capsule 11   ??? carboxymethylcellulose sodium (REFRESH CELLUVISC) 1 % DpGe Instill drops in affected eye(s) as directed as needed.     ??? chlorthalidone (HYGROTON) 25 MG tablet TAKE 0.5 TABLETS (12.5 MG TOTAL) BY MOUTH DAILY AS NEEDED. 30 tablet 11   ??? ferrous sulfate 325 (65 FE) MG tablet Take 325 mg by mouth daily.     ??? MYFORTIC 180 mg EC tablet Take 4 tablets (720 mg total) by mouth Two (2) times a day. 240 tablet 11   ??? omeprazole (PRILOSEC) 40 MG capsule Take 1 capsule (40 mg total) by mouth once daily 30 capsule 11   ??? predniSONE (DELTASONE) 2.5 MG tablet Take 2 tablets (5 mg total) by mouth daily. 60 tablet 11     No current facility-administered medications for this visit.        Changes to medications: Majel reports no changes at this time.    Allergies   Allergen Reactions   ??? Ancef [Cefazolin] Shortness Of Breath   ??? Adhesive Tape-Silicones      Other reaction(s): Other (See Comments)  Uncoded Allergy. Allergen: plastic tape, Other Reaction: blistering   ??? Demerol [Meperidine] Nausea And Vomiting       Changes to allergies: No    SPECIALTY MEDICATION ADHERENCE     Myfortic 180 mg: 7 days of medicine on hand   Prednisone 2.5 mg: 7 days of medicine on hand     Medication Adherence Patient reported X missed doses in the last month: 0  Specialty Medication: Myfortic 180mg   Patient is on additional specialty medications: Yes  Additional Specialty Medications: Prednisone 2.5mg   Patient Reported Additional Medication X Missed Doses in the Last Month: 0  Patient is on more than two specialty medications: No          Specialty medication(s) dose(s) confirmed: Regimen is correct and unchanged.     Are there any concerns with adherence? No    Adherence counseling provided? Not needed    CLINICAL MANAGEMENT AND INTERVENTION      Clinical Benefit Assessment:    Do you feel the medicine is effective or helping your condition? Yes    Clinical Benefit counseling provided? Not needed    Adverse Effects Assessment:    Are you experiencing any side effects? No    Are you experiencing difficulty administering your medicine? No    Quality of Life Assessment:    How many days over the past month did your kidney transplant  keep you from your normal activities? For example, brushing your teeth or getting up in the morning. 0    Have you discussed this with your provider? Not needed    Therapy  Appropriateness:    Is therapy appropriate? Yes, therapy is appropriate and should be continued    DISEASE/MEDICATION-SPECIFIC INFORMATION      N/A    PATIENT SPECIFIC NEEDS     - Does the patient have any physical, cognitive, or cultural barriers? No    - Is the patient high risk? Yes, patient is taking a REMS drug. Medication is dispensed in compliance with REMS program    - Does the patient require a Care Management Plan? No     - Does the patient require physician intervention or other additional services (i.e. nutrition, smoking cessation, social work)? No      SHIPPING     Specialty Medication(s) to be Shipped:   Transplant: Myfortic 180mg  and Prednisone 2.5mg     Other medication(s) to be shipped: omeprazole, calcitriol     Changes to insurance: No    Delivery Scheduled: Yes, Expected medication delivery date: 06/25/20.     Medication will be delivered via UPS to the confirmed prescription address in [facility].    The patient will receive a drug information handout for each medication shipped and additional FDA Medication Guides as required.  Verified that patient has previously received a Conservation officer, historic buildings.    All of the patient's questions and concerns have been addressed.    Tera Helper   [facility] Specialty Pharmacist

## 2020-06-19 NOTE — Unmapped (Unsigned)
AOBP   Patient's blood pressure was taken on right upper arm, Medium cuff.   1st reading 134/87, pulse: 83  2nd reading 144/89, pulse: 84  3rd reading 144/89, pulse: 86  Average reading 141/88, pulse: 84

## 2020-06-20 LAB — CMV DNA, QUANTITATIVE, PCR: CMV VIRAL LD: NOT DETECTED

## 2020-06-24 MED FILL — MYFORTIC 180 MG TABLET,DELAYED RELEASE: 30 days supply | Qty: 240 | Fill #1 | Status: AC

## 2020-06-24 MED FILL — PREDNISONE 2.5 MG TABLET: ORAL | 30 days supply | Qty: 60 | Fill #9

## 2020-06-24 MED FILL — OMEPRAZOLE 40 MG CAPSULE,DELAYED RELEASE: ORAL | 30 days supply | Qty: 30 | Fill #7

## 2020-06-24 MED FILL — PREDNISONE 2.5 MG TABLET: 30 days supply | Qty: 60 | Fill #9 | Status: AC

## 2020-06-24 MED FILL — MYFORTIC 180 MG TABLET,DELAYED RELEASE: ORAL | 30 days supply | Qty: 240 | Fill #1

## 2020-06-24 MED FILL — OMEPRAZOLE 40 MG CAPSULE,DELAYED RELEASE: 30 days supply | Qty: 30 | Fill #7 | Status: AC

## 2020-06-25 LAB — FSAB CLASS 2 ANTIBODY SPECIFICITY

## 2020-06-25 LAB — HLA DS POST TRANSPLANT
ANTI-DONOR DRW #1 MFI: 208 MFI
ANTI-DONOR DRW #2 MFI: 77 MFI
ANTI-DONOR HLA-A #1 MFI: 40 MFI
ANTI-DONOR HLA-A #2 MFI: 170 MFI
ANTI-DONOR HLA-B #1 MFI: 883 MFI
ANTI-DONOR HLA-C #1 MFI: 76 MFI
ANTI-DONOR HLA-C #2 MFI: 31 MFI
ANTI-DONOR HLA-DQB #2 MFI: 59 MFI
ANTI-DONOR HLA-DR #1 MFI: 29 MFI
ANTI-DONOR HLA-DR #2 MFI: 190 MFI

## 2020-06-25 LAB — DONOR DRW ANTIGEN #1

## 2020-06-25 LAB — HLA CL1 ANTIBODY COMM: Lab: 0

## 2020-06-25 LAB — CLASS 2 ANTIBODIES IDENTIFIED

## 2020-06-30 MED FILL — CALCITRIOL 0.25 MCG CAPSULE: 30 days supply | Qty: 30 | Fill #7 | Status: AC

## 2020-06-30 MED FILL — CALCITRIOL 0.25 MCG CAPSULE: ORAL | 30 days supply | Qty: 30 | Fill #7

## 2020-07-12 DIAGNOSIS — Z940 Kidney transplant status: Principal | ICD-10-CM

## 2020-07-16 NOTE — Unmapped (Signed)
[facility] Refill Coordination Note    Specialty Medication(s) to be Shipped:   Transplant: Myfortic 180mg  and Prednisone 2.5mg     Other medication(s) to be shipped: No additional medications requested for fill at this time     Kimberly [name], DOB: [birth year]  Phone: [phone] (home)       All above HIPAA information was verified with patient.     Was a Nurse, learning disability used for this call? No    Completed refill call assessment today to schedule patient's medication shipment from the [facility] [phone]).       Specialty medication(s) and dose(s) confirmed: Regimen is correct and unchanged.   Changes to medications: Perl reports no changes at this time.  Changes to insurance: No  Questions for the pharmacist: No    Confirmed patient received Welcome Packet with first shipment. The patient will receive a drug information handout for each medication shipped and additional FDA Medication Guides as required.       DISEASE/MEDICATION-SPECIFIC INFORMATION        N/A    SPECIALTY MEDICATION ADHERENCE     Medication Adherence    Patient reported X missed doses in the last month: 0  Specialty Medication: Prednisone 2.5mg   Patient is on additional specialty medications: Yes  Additional Specialty Medications: Myfortic 180mg   Patient Reported Additional Medication X Missed Doses in the Last Month: 0  Patient is on more than two specialty medications: No                Prednisone 2.5 mg: 10 days of medicine on hand   Myfortic 180 mg: 10 days of medicine on hand *    SHIPPING     Shipping address confirmed in Epic.     Delivery Scheduled: Yes, Expected medication delivery date: 07/22/20.     Medication will be delivered via UPS to the prescription address in Epic [facility].    Tera Helper   [facility] Specialty Pharmacist

## 2020-07-17 ENCOUNTER — Ambulatory Visit: Admit: 2020-07-17 | Discharge: 2020-07-18 | Payer: MEDICARE

## 2020-07-17 LAB — CBC W/ AUTO DIFF
BASOPHILS RELATIVE PERCENT: 0.8 %
EOSINOPHILS ABSOLUTE COUNT: 0.1 10*9/L (ref 0.0–0.7)
HEMATOCRIT: 24.2 % — ABNORMAL LOW (ref 35.0–44.0)
HEMOGLOBIN: 7.8 g/dL — ABNORMAL LOW (ref 12.0–15.5)
LYMPHOCYTES ABSOLUTE COUNT: 1 10*9/L (ref 0.7–4.0)
LYMPHOCYTES RELATIVE PERCENT: 14.7 %
MEAN CORPUSCULAR HEMOGLOBIN: 23.9 pg — ABNORMAL LOW (ref 26.0–34.0)
MEAN CORPUSCULAR VOLUME: 74.1 fL — ABNORMAL LOW (ref 82.0–98.0)
MEAN PLATELET VOLUME: 6.1 fL — ABNORMAL LOW (ref 7.0–10.0)
MONOCYTES ABSOLUTE COUNT: 0.7 10*9/L (ref 0.1–1.0)
MONOCYTES RELATIVE PERCENT: 11.1 %
NEUTROPHILS ABSOLUTE COUNT: 4.8 10*9/L (ref 1.7–7.7)
NEUTROPHILS RELATIVE PERCENT: 71.9 %
NUCLEATED RED BLOOD CELLS: 0 /100{WBCs} (ref <=4)
RED BLOOD CELL COUNT: 3.26 10*12/L — ABNORMAL LOW (ref 3.90–5.03)
RED CELL DISTRIBUTION WIDTH: 14.1 % (ref 12.0–15.0)
WBC ADJUSTED: 6.7 10*9/L (ref 3.5–10.5)

## 2020-07-17 LAB — CLARITY

## 2020-07-17 LAB — COMPREHENSIVE METABOLIC PANEL
ALBUMIN: 4 g/dL (ref 3.4–5.0)
ALT (SGPT): 48 U/L (ref 10–49)
ANION GAP: 6 mmol/L (ref 5–14)
AST (SGOT): 27 U/L (ref <=34)
BILIRUBIN TOTAL: 0.4 mg/dL (ref 0.3–1.2)
BLOOD UREA NITROGEN: 18 mg/dL (ref 9–23)
BUN / CREAT RATIO: 12
CALCIUM: 10.4 mg/dL (ref 8.7–10.4)
CHLORIDE: 103 mmol/L (ref 98–107)
CO2: 26.2 mmol/L (ref 20.0–31.0)
CREATININE: 1.52 mg/dL — ABNORMAL HIGH
EGFR CKD-EPI AA FEMALE: 49 mL/min/{1.73_m2} — ABNORMAL LOW (ref >=60)
EGFR CKD-EPI NON-AA FEMALE: 43 mL/min/{1.73_m2} — ABNORMAL LOW (ref >=60)
GLUCOSE RANDOM: 88 mg/dL (ref 70–179)
POTASSIUM: 3.2 mmol/L — ABNORMAL LOW (ref 3.4–4.5)
SODIUM: 135 mmol/L (ref 135–145)

## 2020-07-17 LAB — URINALYSIS
GLUCOSE UA: NEGATIVE
KETONES UA: NEGATIVE
LEUKOCYTE ESTERASE UA: NEGATIVE
NITRITE UA: NEGATIVE
PH UA: 6 (ref 5.0–9.0)
PROTEIN UA: NEGATIVE
SPECIFIC GRAVITY UA: 1.01 (ref 1.005–1.030)
SQUAMOUS EPITHELIAL: 2 /HPF (ref 0–5)
UROBILINOGEN UA: 0.2
WBC UA: 3 /HPF (ref 0–5)

## 2020-07-17 LAB — HEMATOCRIT: Hematocrit:VFr:Pt:Bld:Qn:: 24.2 — ABNORMAL LOW

## 2020-07-17 LAB — EGFR CKD-EPI NON-AA FEMALE
Glomerular filtration rate/1.73 sq M.predicted.non black:ArVRat:Pt:Ser/Plas/Bld:Qn:Creatinine-based formula (CKD-EPI): 43 — ABNORMAL LOW

## 2020-07-17 LAB — PHOSPHORUS: Phosphate:MCnc:Pt:Ser/Plas:Qn:: 3.2

## 2020-07-17 LAB — MAGNESIUM: Magnesium:MCnc:Pt:Ser/Plas:Qn:: 1.7

## 2020-07-17 MED ADMIN — belatacept (NULOJIX) 437.5 mg in sodium chloride (NS) 0.9 % 100 mL IVPB: 5 mg/kg | INTRAVENOUS | @ 15:00:00 | Stop: 2020-07-17

## 2020-07-17 NOTE — Unmapped (Signed)
1000 Patient in today for Belatacept infusion. Patient has no s/s of infection. No issues from the last infusion.Labs drawn and sent to lab.  1037 Belatacept started ,patient instructed to use call bell /call nurse for any s/s of unsual symptoms during infusion. Patient educated on possible s/s of reaction,such as chestpain ,itching,shortness of breath lightheadedness and any kind of discomfort. Patient verbalized understanding.  1107 Infusion completed and well tolerated.  1120 Belatacept 437.5 mg/100 ml NS infused over 30 min. per protocol . Pt alert and oriented, offered no complaints during infusion. IV flushed with 10ml NS post infusion. Pt tolerated infusion well.

## 2020-07-18 LAB — CMV DNA, QUANTITATIVE, PCR

## 2020-07-18 LAB — CMV QUANT LOG10: Lab: 0

## 2020-07-21 MED FILL — OMEPRAZOLE 40 MG CAPSULE,DELAYED RELEASE: 30 days supply | Qty: 30 | Fill #8 | Status: AC

## 2020-07-21 MED FILL — PREDNISONE 2.5 MG TABLET: ORAL | 30 days supply | Qty: 60 | Fill #10

## 2020-07-21 MED FILL — PREDNISONE 2.5 MG TABLET: 30 days supply | Qty: 60 | Fill #10 | Status: AC

## 2020-07-21 MED FILL — OMEPRAZOLE 40 MG CAPSULE,DELAYED RELEASE: ORAL | 30 days supply | Qty: 30 | Fill #8

## 2020-07-21 MED FILL — MYFORTIC 180 MG TABLET,DELAYED RELEASE: 30 days supply | Qty: 240 | Fill #2 | Status: AC

## 2020-07-21 MED FILL — MYFORTIC 180 MG TABLET,DELAYED RELEASE: ORAL | 30 days supply | Qty: 240 | Fill #2

## 2020-08-09 DIAGNOSIS — Z940 Kidney transplant status: Principal | ICD-10-CM

## 2020-08-12 NOTE — Unmapped (Signed)
[facility] Refill Coordination Note    Specialty Medication(s) to be Shipped:   Transplant: Myfortic 180mg  and Prednisone 2.5mg     Other medication(s) to be shipped: calcitriol, omeprazole     Kimberly [name], DOB: May 25, 1981  Phone: [phone] (home)       All above HIPAA information was verified with patient.     Was a Nurse, learning disability used for this call? No    Completed refill call assessment today to schedule patient's medication shipment from the [facility] [phone]).       Specialty medication(s) and dose(s) confirmed: Regimen is correct and unchanged.   Changes to medications: Jamine reports no changes at this time.  Changes to insurance: No  Questions for the pharmacist: No    Confirmed patient received Welcome Packet with first shipment. The patient will receive a drug information handout for each medication shipped and additional FDA Medication Guides as required.       DISEASE/MEDICATION-SPECIFIC INFORMATION        N/A    SPECIALTY MEDICATION ADHERENCE     Medication Adherence    Patient reported X missed doses in the last month: 0  Specialty Medication: Myfortic 180mg   Patient is on additional specialty medications: Yes  Additional Specialty Medications: Prednisone 2.5mg   Patient Reported Additional Medication X Missed Doses in the Last Month: 0  Patient is on more than two specialty medications: No                Myfortic 180 mg: 13 days of medicine on hand   Prednisone 2.5 mg: 13 days of medicine on hand        SHIPPING     Shipping address confirmed in Epic.     Delivery Scheduled: Yes, Expected medication delivery date: 08/19/20.     Medication will be delivered via UPS to the prescription address in Epic [facility].    Tera Helper   [facility] Specialty Pharmacist

## 2020-08-14 ENCOUNTER — Ambulatory Visit: Admit: 2020-08-14 | Discharge: 2020-08-15 | Payer: MEDICARE

## 2020-08-14 LAB — CBC W/ AUTO DIFF
BASOPHILS ABSOLUTE COUNT: 0.1 10*9/L (ref 0.0–0.1)
BASOPHILS RELATIVE PERCENT: 1 %
EOSINOPHILS ABSOLUTE COUNT: 0.1 10*9/L (ref 0.0–0.7)
EOSINOPHILS RELATIVE PERCENT: 1.6 %
HEMATOCRIT: 28.4 % — ABNORMAL LOW (ref 35.0–44.0)
HEMOGLOBIN: 8.9 g/dL — ABNORMAL LOW (ref 12.0–15.5)
LYMPHOCYTES ABSOLUTE COUNT: 2 10*9/L (ref 0.7–4.0)
LYMPHOCYTES RELATIVE PERCENT: 26.7 %
MEAN CORPUSCULAR HEMOGLOBIN: 25.1 pg — ABNORMAL LOW (ref 26.0–34.0)
MEAN CORPUSCULAR VOLUME: 80 fL — ABNORMAL LOW (ref 82.0–98.0)
MEAN PLATELET VOLUME: 6.2 fL — ABNORMAL LOW (ref 7.0–10.0)
MONOCYTES RELATIVE PERCENT: 11.3 %
NEUTROPHILS ABSOLUTE COUNT: 4.6 10*9/L (ref 1.7–7.7)
NEUTROPHILS RELATIVE PERCENT: 59.4 %
PLATELET COUNT: 535 10*9/L — ABNORMAL HIGH (ref 150–450)
RED BLOOD CELL COUNT: 3.55 10*12/L — ABNORMAL LOW (ref 3.90–5.03)
RED CELL DISTRIBUTION WIDTH: 21.9 % — ABNORMAL HIGH (ref 12.0–15.0)
WBC ADJUSTED: 7.7 10*9/L (ref 3.5–10.5)

## 2020-08-14 LAB — URINALYSIS
BACTERIA: NONE SEEN /HPF
BILIRUBIN UA: NEGATIVE
GLUCOSE UA: NEGATIVE
KETONES UA: NEGATIVE
LEUKOCYTE ESTERASE UA: NEGATIVE
NITRITE UA: NEGATIVE
PH UA: 6.5 (ref 5.0–9.0)
RBC UA: <1 /HPF (ref <4)
SPECIFIC GRAVITY UA: <=1.005 (ref 1.005–1.030)
SQUAMOUS EPITHELIAL: <1 /HPF (ref 0–5)
UROBILINOGEN UA: 0.2
WBC UA: <1 /HPF (ref 0–5)

## 2020-08-14 LAB — SMEAR REVIEW

## 2020-08-14 LAB — COMPREHENSIVE METABOLIC PANEL
ALBUMIN: 4.1 g/dL (ref 3.4–5.0)
ALKALINE PHOSPHATASE: 84 U/L (ref 46–116)
ALT (SGPT): 30 U/L (ref 10–49)
ANION GAP: 10 mmol/L (ref 5–14)
AST (SGOT): 21 U/L (ref <=34)
BILIRUBIN TOTAL: 0.3 mg/dL (ref 0.3–1.2)
BLOOD UREA NITROGEN: 21 mg/dL (ref 9–23)
BUN / CREAT RATIO: 15
CALCIUM: 10.5 mg/dL — ABNORMAL HIGH (ref 8.7–10.4)
CO2: 26.8 mmol/L (ref 20.0–31.0)
CREATININE: 1.36 mg/dL — ABNORMAL HIGH
EGFR CKD-EPI AA FEMALE: 56 mL/min/{1.73_m2} — ABNORMAL LOW (ref >=60)
EGFR CKD-EPI NON-AA FEMALE: 49 mL/min/{1.73_m2} — ABNORMAL LOW (ref >=60)
GLUCOSE RANDOM: 93 mg/dL (ref 70–179)
POTASSIUM: 3.1 mmol/L — ABNORMAL LOW (ref 3.4–4.5)
PROTEIN TOTAL: 7 g/dL (ref 5.7–8.2)
SODIUM: 137 mmol/L (ref 135–145)

## 2020-08-14 LAB — BUN / CREAT RATIO: Urea nitrogen/Creatinine:MRto:Pt:Ser/Plas:Qn:: 15

## 2020-08-14 LAB — PHOSPHORUS: Phosphate:MCnc:Pt:Ser/Plas:Qn:: 2.5

## 2020-08-14 LAB — MAGNESIUM: Magnesium:MCnc:Pt:Ser/Plas:Qn:: 1.8

## 2020-08-14 LAB — ANISOCYTOSIS

## 2020-08-14 LAB — BLOOD UA

## 2020-08-14 MED ADMIN — belatacept (NULOJIX) 437.5 mg in sodium chloride (NS) 0.9 % 100 mL IVPB: 5 mg/kg | INTRAVENOUS | @ 14:00:00 | Stop: 2020-08-14

## 2020-08-14 NOTE — Unmapped (Signed)
Pt present here for Belatacept. Denies any recent fever/chills or contact with sick persons. VSS.   Pt has had this medicine in the past w/o any issues. IV??24 G??obtained on RAC??.   Necessary blood work and urine collected prior infusion. Call bell within reach  ??  @ 1029 am Belatacept infusion initiated at this time  ??  @1112  am  ??Infusion completed. VSS. Iv flushed per protocol and d/c'd.   Pt denies any complaints. Pt left clinic in stable condition

## 2020-08-15 LAB — CMV DNA, QUANTITATIVE, PCR
CMV QUANT: <50 [IU]/mL — ABNORMAL HIGH (ref <0)
CMV VIRAL LD: DETECTED — AB

## 2020-08-15 LAB — CMV COMMENT: Lab: 0

## 2020-08-18 NOTE — Unmapped (Signed)
Kimberly [name] 's entire shipment will be delayed as a result of the medication is too soon to refill until 08/20/20.     I have reached out to the patient and communicated the delivery change. We will reschedule the medication for the delivery date that the patient agreed upon.  We have confirmed the delivery date as 08/21/20

## 2020-08-20 DIAGNOSIS — Z940 Kidney transplant status: Principal | ICD-10-CM

## 2020-08-20 MED FILL — CALCITRIOL 0.25 MCG CAPSULE: 30 days supply | Qty: 30 | Fill #8 | Status: AC

## 2020-08-20 MED FILL — OMEPRAZOLE 40 MG CAPSULE,DELAYED RELEASE: ORAL | 30 days supply | Qty: 30 | Fill #9

## 2020-08-20 MED FILL — CALCITRIOL 0.25 MCG CAPSULE: ORAL | 30 days supply | Qty: 30 | Fill #8

## 2020-08-20 MED FILL — PREDNISONE 2.5 MG TABLET: ORAL | 30 days supply | Qty: 60 | Fill #11

## 2020-08-20 MED FILL — PREDNISONE 2.5 MG TABLET: 30 days supply | Qty: 60 | Fill #11 | Status: AC

## 2020-08-20 MED FILL — OMEPRAZOLE 40 MG CAPSULE,DELAYED RELEASE: 30 days supply | Qty: 30 | Fill #9 | Status: AC

## 2020-08-20 NOTE — Unmapped (Signed)
Kimberly [name] 's Myfortic shipment will be delayed as a result of a high copay.     I have reached out to the patient and communicated the delay. We will call the patient back to reschedule the delivery upon resolution. We have not confirmed the new delivery date.      All other medications shipping today.

## 2020-08-21 MED FILL — MYFORTIC 180 MG TABLET,DELAYED RELEASE: ORAL | 30 days supply | Qty: 240 | Fill #3

## 2020-08-21 MED FILL — MYFORTIC 180 MG TABLET,DELAYED RELEASE: 30 days supply | Qty: 240 | Fill #3 | Status: AC

## 2020-08-21 NOTE — Unmapped (Signed)
Kimberly [name] 's Myfortic shipment will be sent out  as a result of copay is now approved by patient/caregiver.      I have reached out to the patient and communicated the delivery change. We will reschedule the medication for the delivery date that the patient agreed upon.  We have confirmed the delivery date as 08/24/20 via UPS.

## 2020-09-06 DIAGNOSIS — Z940 Kidney transplant status: Principal | ICD-10-CM

## 2020-09-06 DIAGNOSIS — Z79899 Other long term (current) drug therapy: Principal | ICD-10-CM

## 2020-09-09 DIAGNOSIS — Z940 Kidney transplant status: Principal | ICD-10-CM

## 2020-09-09 MED ORDER — PREDNISONE 2.5 MG TABLET
ORAL_TABLET | Freq: Every day | ORAL | 11 refills | 30.00000 days | Status: CN
Start: 2020-09-09 — End: 2021-09-09

## 2020-09-09 NOTE — Unmapped (Signed)
[facility] Refill Coordination Note    Specialty Medication(s) to be Shipped:   Transplant: Myfortic 180mg  and Prednisone 2.5mg     Other medication(s) to be shipped: calcitriol and omeprazole     Kimberly [name], DOB: 1980-11-18  Phone: [phone] (home)       All above HIPAA information was verified with patient.     Was a Nurse, learning disability used for this call? No    Completed refill call assessment today to schedule patient's medication shipment from the [facility] [phone]).       Specialty medication(s) and dose(s) confirmed: Regimen is correct and unchanged.   Changes to medications: Kimberly [name] reports no changes at this time.  Changes to insurance: No  Questions for the pharmacist: No    Confirmed patient received Welcome Packet with first shipment. The patient will receive a drug information handout for each medication shipped and additional FDA Medication Guides as required.       DISEASE/MEDICATION-SPECIFIC INFORMATION        N/A    SPECIALTY MEDICATION ADHERENCE     Medication Adherence    Patient reported X missed doses in the last month: 0  Specialty Medication: Prednisone 2.5mg   Patient is on additional specialty medications: Yes  Additional Specialty Medications: Myfortic 180mg   Patient Reported Additional Medication X Missed Doses in the Last Month: 0  Patient is on more than two specialty medications: No        Prednisone 2.5 mg: 12 days of medicine on hand   Myfortic 180 mg: 12 days of medicine on hand     SHIPPING     Shipping address confirmed in Epic.     Delivery Scheduled: Yes, Expected medication delivery date: 09/21/2020.     Medication will be delivered via UPS to the prescription address in Epic WAM.    Kimberly [name] [facility] Specialty Technician

## 2020-09-11 ENCOUNTER — Ambulatory Visit: Admit: 2020-09-11 | Discharge: 2020-09-12 | Payer: MEDICARE

## 2020-09-11 DIAGNOSIS — Z79899 Other long term (current) drug therapy: Principal | ICD-10-CM

## 2020-09-11 DIAGNOSIS — Z940 Kidney transplant status: Principal | ICD-10-CM

## 2020-09-11 DIAGNOSIS — Z4822 Encounter for aftercare following kidney transplant: Principal | ICD-10-CM

## 2020-09-11 LAB — CBC W/ AUTO DIFF
BASOPHILS ABSOLUTE COUNT: 0.1 10*9/L (ref 0.0–0.1)
BASOPHILS RELATIVE PERCENT: 1.4 %
EOSINOPHILS ABSOLUTE COUNT: 0.1 10*9/L (ref 0.0–0.7)
EOSINOPHILS RELATIVE PERCENT: 1.6 %
HEMATOCRIT: 34.4 % — ABNORMAL LOW (ref 35.0–44.0)
HEMOGLOBIN: 11.1 g/dL — ABNORMAL LOW (ref 12.0–15.5)
LYMPHOCYTES ABSOLUTE COUNT: 1.1 10*9/L (ref 0.7–4.0)
LYMPHOCYTES RELATIVE PERCENT: 20.1 %
MEAN CORPUSCULAR HEMOGLOBIN CONC: 32.1 g/dL (ref 30.0–36.0)
MEAN CORPUSCULAR HEMOGLOBIN: 25.7 pg — ABNORMAL LOW (ref 26.0–34.0)
MEAN CORPUSCULAR VOLUME: 80 fL — ABNORMAL LOW (ref 82.0–98.0)
MEAN PLATELET VOLUME: 6.5 fL — ABNORMAL LOW (ref 7.0–10.0)
MONOCYTES ABSOLUTE COUNT: 0.7 10*9/L (ref 0.1–1.0)
MONOCYTES RELATIVE PERCENT: 12.6 %
NEUTROPHILS ABSOLUTE COUNT: 3.6 10*9/L (ref 1.7–7.7)
NEUTROPHILS RELATIVE PERCENT: 64.3 %
NUCLEATED RED BLOOD CELLS: 0 /100{WBCs} (ref <=4)
PLATELET COUNT: 386 10*9/L (ref 150–450)
RED BLOOD CELL COUNT: 4.3 10*12/L (ref 3.90–5.03)
RED CELL DISTRIBUTION WIDTH: 17.8 % — ABNORMAL HIGH (ref 12.0–15.0)
WBC ADJUSTED: 5.6 10*9/L (ref 3.5–10.5)

## 2020-09-11 LAB — COMPREHENSIVE METABOLIC PANEL
ALBUMIN: 4.4 g/dL (ref 3.4–5.0)
ALKALINE PHOSPHATASE: 77 U/L (ref 46–116)
ALT (SGPT): 28 U/L (ref 10–49)
ANION GAP: 5 mmol/L (ref 5–14)
AST (SGOT): 21 U/L (ref <=34)
BILIRUBIN TOTAL: 0.4 mg/dL (ref 0.3–1.2)
BLOOD UREA NITROGEN: 17 mg/dL (ref 9–23)
BUN / CREAT RATIO: 13
CALCIUM: 10.6 mg/dL — ABNORMAL HIGH (ref 8.7–10.4)
CHLORIDE: 102 mmol/L (ref 98–107)
CO2: 28.9 mmol/L (ref 20.0–31.0)
CREATININE: 1.33 mg/dL — ABNORMAL HIGH
EGFR CKD-EPI AA FEMALE: 58 mL/min/{1.73_m2} — ABNORMAL LOW (ref >=60)
EGFR CKD-EPI NON-AA FEMALE: 50 mL/min/{1.73_m2} — ABNORMAL LOW (ref >=60)
GLUCOSE RANDOM: 84 mg/dL (ref 70–179)
POTASSIUM: 3.4 mmol/L (ref 3.4–4.5)
PROTEIN TOTAL: 7.1 g/dL (ref 5.7–8.2)
SODIUM: 136 mmol/L (ref 135–145)

## 2020-09-11 LAB — URINALYSIS
BILIRUBIN UA: NEGATIVE
GLUCOSE UA: NEGATIVE
KETONES UA: NEGATIVE
LEUKOCYTE ESTERASE UA: NEGATIVE
NITRITE UA: NEGATIVE
PH UA: 6 (ref 5.0–9.0)
PROTEIN UA: NEGATIVE
RBC UA: <1 /HPF (ref <4)
SPECIFIC GRAVITY UA: <=1.005 (ref 1.005–1.030)
SQUAMOUS EPITHELIAL: <1 /HPF (ref 0–5)
UROBILINOGEN UA: 0.2
WBC UA: 1 /HPF (ref 0–5)

## 2020-09-11 LAB — MAGNESIUM: MAGNESIUM: 1.8 mg/dL (ref 1.6–2.6)

## 2020-09-11 LAB — PHOSPHORUS: PHOSPHORUS: 2.7 mg/dL (ref 2.4–5.1)

## 2020-09-11 MED ORDER — SERTRALINE 50 MG TABLET
ORAL_TABLET | 2 refills | 0 days
Start: 2020-09-11 — End: ?

## 2020-09-11 MED ADMIN — belatacept (NULOJIX) 450 mg in sodium chloride (NS) 0.9 % 100 mL IVPB: 5 mg/kg | INTRAVENOUS | @ 16:00:00 | Stop: 2020-09-11

## 2020-09-11 NOTE — Unmapped (Signed)
Pt to [facility] for infusion of Belatacept. Pt denies new complaints/ infections. VSS, IV started in RAC,  labs drawn, urine collected .   1036 Belatacept 450 mg  1106 Belatacept complete   VSS- PIV removed per protocol. Discharged to home without issue.

## 2020-09-13 LAB — CMV DNA, QUANTITATIVE, PCR
CMV QUANT: <50 [IU]/mL — ABNORMAL HIGH (ref <0)
CMV VIRAL LD: DETECTED — AB

## 2020-09-15 MED ORDER — SERTRALINE 50 MG TABLET
ORAL_TABLET | Freq: Every day | ORAL | 3 refills | 90.00000 days | Status: CP
Start: 2020-09-15 — End: 2021-09-15

## 2020-09-16 DIAGNOSIS — Z940 Kidney transplant status: Principal | ICD-10-CM

## 2020-09-16 MED ORDER — PREDNISONE 2.5 MG TABLET
ORAL_TABLET | Freq: Every day | ORAL | 11 refills | 30 days | Status: CP
Start: 2020-09-16 — End: 2021-09-16
  Filled 2020-09-21: qty 60, 30d supply, fill #0

## 2020-09-16 NOTE — Unmapped (Signed)
COLLEEN KOTLARZ 's entire shipment will be delayed as a result of the medication is too soon to refill until 11/27.     I have reached out to the patient and communicated the delivery change. We will reschedule the medication for the delivery date that the patient agreed upon.  We have confirmed the delivery date as 11/30, via ups.    Patient aware of potential delay on prednisone due to pending refill request.

## 2020-09-21 MED FILL — OMEPRAZOLE 40 MG CAPSULE,DELAYED RELEASE: ORAL | 30 days supply | Qty: 30 | Fill #10

## 2020-09-21 MED FILL — PREDNISONE 2.5 MG TABLET: 30 days supply | Qty: 60 | Fill #0 | Status: AC

## 2020-09-21 MED FILL — CALCITRIOL 0.25 MCG CAPSULE: ORAL | 30 days supply | Qty: 30 | Fill #9

## 2020-09-21 MED FILL — OMEPRAZOLE 40 MG CAPSULE,DELAYED RELEASE: 30 days supply | Qty: 30 | Fill #10 | Status: AC

## 2020-09-21 MED FILL — MYFORTIC 180 MG TABLET,DELAYED RELEASE: 30 days supply | Qty: 240 | Fill #4 | Status: AC

## 2020-09-21 MED FILL — MYFORTIC 180 MG TABLET,DELAYED RELEASE: ORAL | 30 days supply | Qty: 240 | Fill #4

## 2020-09-21 MED FILL — CALCITRIOL 0.25 MCG CAPSULE: 30 days supply | Qty: 30 | Fill #9 | Status: AC

## 2020-10-03 DIAGNOSIS — Z940 Kidney transplant status: Principal | ICD-10-CM

## 2020-10-03 DIAGNOSIS — Z79899 Other long term (current) drug therapy: Principal | ICD-10-CM

## 2020-10-09 ENCOUNTER — Ambulatory Visit: Admit: 2020-10-09 | Discharge: 2020-10-10 | Payer: MEDICARE

## 2020-10-09 DIAGNOSIS — Z940 Kidney transplant status: Principal | ICD-10-CM

## 2020-10-09 DIAGNOSIS — N390 Urinary tract infection, site not specified: Principal | ICD-10-CM

## 2020-10-09 DIAGNOSIS — Z79899 Other long term (current) drug therapy: Principal | ICD-10-CM

## 2020-10-09 LAB — URINALYSIS
BILIRUBIN UA: NEGATIVE
GLUCOSE UA: NEGATIVE
KETONES UA: NEGATIVE
NITRITE UA: NEGATIVE
PH UA: 5.5 (ref 5.0–9.0)
RBC UA: 2 /HPF (ref <4)
RENAL TUBULAR EPITHELIAL CELLS: 1 /HPF — ABNORMAL HIGH
SPECIFIC GRAVITY UA: 1.015 (ref 1.005–1.030)
SQUAMOUS EPITHELIAL: 4 /HPF (ref 0–5)
UROBILINOGEN UA: 0.2
WBC UA: 100 /HPF — ABNORMAL HIGH (ref 0–5)

## 2020-10-09 LAB — CBC W/ AUTO DIFF
BASOPHILS ABSOLUTE COUNT: 0.1 10*9/L (ref 0.0–0.1)
BASOPHILS RELATIVE PERCENT: 0.7 %
EOSINOPHILS ABSOLUTE COUNT: 0.1 10*9/L (ref 0.0–0.7)
EOSINOPHILS RELATIVE PERCENT: 1.1 %
HEMATOCRIT: 36.3 % (ref 35.0–44.0)
HEMOGLOBIN: 12 g/dL (ref 12.0–15.5)
LYMPHOCYTES ABSOLUTE COUNT: 1.2 10*9/L (ref 0.7–4.0)
LYMPHOCYTES RELATIVE PERCENT: 12.5 %
MEAN CORPUSCULAR HEMOGLOBIN CONC: 33.1 g/dL (ref 30.0–36.0)
MEAN CORPUSCULAR HEMOGLOBIN: 26.3 pg (ref 26.0–34.0)
MEAN CORPUSCULAR VOLUME: 79.4 fL — ABNORMAL LOW (ref 82.0–98.0)
MEAN PLATELET VOLUME: 6.4 fL — ABNORMAL LOW (ref 7.0–10.0)
MONOCYTES ABSOLUTE COUNT: 0.9 10*9/L (ref 0.1–1.0)
MONOCYTES RELATIVE PERCENT: 9.3 %
NEUTROPHILS ABSOLUTE COUNT: 7.6 10*9/L (ref 1.7–7.7)
NEUTROPHILS RELATIVE PERCENT: 76.4 %
PLATELET COUNT: 343 10*9/L (ref 150–450)
RED BLOOD CELL COUNT: 4.57 10*12/L (ref 3.90–5.03)
RED CELL DISTRIBUTION WIDTH: 15.8 % — ABNORMAL HIGH (ref 12.0–15.0)
WBC ADJUSTED: 9.9 10*9/L (ref 3.5–10.5)

## 2020-10-09 LAB — COMPREHENSIVE METABOLIC PANEL
ALBUMIN: 4.2 g/dL (ref 3.4–5.0)
ALKALINE PHOSPHATASE: 70 U/L (ref 46–116)
ALT (SGPT): 35 U/L (ref 10–49)
ANION GAP: 8 mmol/L (ref 5–14)
AST (SGOT): 25 U/L (ref <=34)
BILIRUBIN TOTAL: 0.4 mg/dL (ref 0.3–1.2)
BLOOD UREA NITROGEN: 21 mg/dL (ref 9–23)
BUN / CREAT RATIO: 16
CALCIUM: 10.6 mg/dL — ABNORMAL HIGH (ref 8.7–10.4)
CHLORIDE: 101 mmol/L (ref 98–107)
CO2: 28.2 mmol/L (ref 20.0–31.0)
CREATININE: 1.29 mg/dL — ABNORMAL HIGH
EGFR CKD-EPI AA FEMALE: 60 mL/min/{1.73_m2} (ref >=60)
EGFR CKD-EPI NON-AA FEMALE: 52 mL/min/{1.73_m2} — ABNORMAL LOW (ref >=60)
GLUCOSE RANDOM: 80 mg/dL (ref 70–179)
POTASSIUM: 3.5 mmol/L (ref 3.4–4.5)
PROTEIN TOTAL: 6.9 g/dL (ref 5.7–8.2)
SODIUM: 137 mmol/L (ref 135–145)

## 2020-10-09 LAB — MAGNESIUM: MAGNESIUM: 1.8 mg/dL (ref 1.6–2.6)

## 2020-10-09 LAB — PHOSPHORUS: PHOSPHORUS: 2.4 mg/dL (ref 2.4–5.1)

## 2020-10-09 MED ORDER — SULFAMETHOXAZOLE 800 MG-TRIMETHOPRIM 160 MG TABLET
ORAL_TABLET | Freq: Two times a day (BID) | ORAL | 0 refills | 5.00000 days | Status: CP
Start: 2020-10-09 — End: 2020-10-16

## 2020-10-09 MED ADMIN — belatacept (NULOJIX) 450 mg in sodium chloride (NS) 0.9 % 100 mL IVPB: 5 mg/kg | INTRAVENOUS | @ 15:00:00 | Stop: 2020-10-09

## 2020-10-09 NOTE — Unmapped (Signed)
Pt present here for Belatacept. Denies any recent fever/chills or contact with sick persons. VSS.   Pt has had this medicine in the past w/o any issues. IV??24 G??obtained on RAC??.   Ordered blood work and urine collected prior infusion. Call bell within reach  ??  @ 1015 am Belatacept infusion initiated at this time  ??  @ 1042 am  ??Infusion completed. VSS. Iv flushed per protocol and d/c'd.   Pt denies any complaints. Pt left clinic in stable condition

## 2020-10-09 NOTE — Unmapped (Signed)
Reviewed labs w/ Dr Nestor Lewandowsky. Ordered [facility] x [facility]d [facility]. Pt made aware via telephone. Reports no change in symptoms, no fever.

## 2020-10-10 DIAGNOSIS — N3000 Acute cystitis without hematuria: Principal | ICD-10-CM

## 2020-10-10 LAB — CMV DNA, QUANTITATIVE, PCR
CMV QUANT: <50 [IU]/mL — ABNORMAL HIGH (ref <0)
CMV VIRAL LD: DETECTED — AB

## 2020-10-13 NOTE — Unmapped (Signed)
[facility] Refill Coordination Note    Specialty Medication(s) to be Shipped:   Transplant: Myfortic 180mg  and Prednisone 2.5mg     Other medication(s) to be shipped: calcitriol and omeprazole     Kimberly [name], DOB: 02-23-1981  Phone: [phone] (home)       All above HIPAA information was verified with patient.     Was a Nurse, learning disability used for this call? No    Completed refill call assessment today to schedule patient's medication shipment from the [facility] [phone]).       Specialty medication(s) and dose(s) confirmed: Regimen is correct and unchanged.   Changes to medications: Kimberly [name] reports no changes at this time.  Changes to insurance: No  Questions for the pharmacist: No    Confirmed patient received Welcome Packet with first shipment. The patient will receive a drug information handout for each medication shipped and additional FDA Medication Guides as required.       DISEASE/MEDICATION-SPECIFIC INFORMATION        N/A    SPECIALTY MEDICATION ADHERENCE     Medication Adherence    Patient reported X missed doses in the last month: 0  Specialty Medication: MYFORTIC 180 mg EC tablet  Patient is on additional specialty medications: Yes  Additional Specialty Medications: predniSONE (DELTASONE) 2.5 MG tablet  Patient Reported Additional Medication X Missed Doses in the Last Month: 0  Patient is on more than two specialty medications: No  Any gaps in refill history greater than 2 weeks in the last 3 months: no  Demonstrates understanding of importance of adherence: yes  Informant: patient  Reliability of informant: reliable  Confirmed plan for next specialty medication refill: delivery by pharmacy  Refills needed for supportive medications: not needed        Prednisone 2.5 mg: 14 days of medicine on hand   Myfortic 180 mg: 14 days of medicine on hand     SHIPPING     Shipping address confirmed in Epic.     Delivery Scheduled: Yes, Expected medication delivery date: 10/21/2020.     Medication will be delivered via UPS to the prescription address in Epic WAM.    [name]   [facility] Specialty Technician

## 2020-10-16 DIAGNOSIS — Z940 Kidney transplant status: Principal | ICD-10-CM

## 2020-10-16 DIAGNOSIS — N390 Urinary tract infection, site not specified: Principal | ICD-10-CM

## 2020-10-16 MED ORDER — SULFAMETHOXAZOLE 800 MG-TRIMETHOPRIM 160 MG TABLET
ORAL_TABLET | Freq: Two times a day (BID) | ORAL | 2 refills | 5 days | Status: CP
Start: 2020-10-16 — End: 2020-10-21

## 2020-10-20 MED FILL — MYFORTIC 180 MG TABLET,DELAYED RELEASE: 30 days supply | Qty: 240 | Fill #5 | Status: AC

## 2020-10-20 MED FILL — MYFORTIC 180 MG TABLET,DELAYED RELEASE: ORAL | 30 days supply | Qty: 240 | Fill #5

## 2020-10-20 MED FILL — OMEPRAZOLE 40 MG CAPSULE,DELAYED RELEASE: ORAL | 30 days supply | Qty: 30 | Fill #11

## 2020-10-20 MED FILL — PREDNISONE 2.5 MG TABLET: 30 days supply | Qty: 60 | Fill #1 | Status: AC

## 2020-10-20 MED FILL — OMEPRAZOLE 40 MG CAPSULE,DELAYED RELEASE: 30 days supply | Qty: 30 | Fill #11 | Status: AC

## 2020-10-20 MED FILL — CALCITRIOL 0.25 MCG CAPSULE: ORAL | 30 days supply | Qty: 30 | Fill #10

## 2020-10-20 MED FILL — CALCITRIOL 0.25 MCG CAPSULE: 30 days supply | Qty: 30 | Fill #10 | Status: AC

## 2020-10-20 MED FILL — PREDNISONE 2.5 MG TABLET: ORAL | 30 days supply | Qty: 60 | Fill #1

## 2020-10-27 DIAGNOSIS — Z79899 Other long term (current) drug therapy: Principal | ICD-10-CM

## 2020-10-27 DIAGNOSIS — E119 Type 2 diabetes mellitus without complications: Principal | ICD-10-CM

## 2020-10-27 DIAGNOSIS — Z940 Kidney transplant status: Principal | ICD-10-CM

## 2020-10-30 ENCOUNTER — Ambulatory Visit: Admit: 2020-10-30 | Discharge: 2020-10-31 | Payer: MEDICARE | Attending: Nephrology | Primary: Nephrology

## 2020-10-30 ENCOUNTER — Ambulatory Visit: Admit: 2020-10-30 | Discharge: 2020-10-31 | Payer: MEDICARE

## 2020-10-30 DIAGNOSIS — Z7982 Long term (current) use of aspirin: Principal | ICD-10-CM

## 2020-10-30 DIAGNOSIS — E876 Hypokalemia: Principal | ICD-10-CM

## 2020-10-30 DIAGNOSIS — R609 Edema, unspecified: Principal | ICD-10-CM

## 2020-10-30 DIAGNOSIS — I959 Hypotension, unspecified: Principal | ICD-10-CM

## 2020-10-30 DIAGNOSIS — M7989 Other specified soft tissue disorders: Principal | ICD-10-CM

## 2020-10-30 DIAGNOSIS — E7800 Pure hypercholesterolemia, unspecified: Principal | ICD-10-CM

## 2020-10-30 DIAGNOSIS — F419 Anxiety disorder, unspecified: Principal | ICD-10-CM

## 2020-10-30 DIAGNOSIS — D649 Anemia, unspecified: Principal | ICD-10-CM

## 2020-10-30 DIAGNOSIS — I120 Hypertensive chronic kidney disease with stage 5 chronic kidney disease or end stage renal disease: Principal | ICD-10-CM

## 2020-10-30 DIAGNOSIS — Z940 Kidney transplant status: Principal | ICD-10-CM

## 2020-10-30 DIAGNOSIS — Z992 Dependence on renal dialysis: Principal | ICD-10-CM

## 2020-10-30 DIAGNOSIS — Z79899 Other long term (current) drug therapy: Principal | ICD-10-CM

## 2020-10-30 DIAGNOSIS — E119 Type 2 diabetes mellitus without complications: Principal | ICD-10-CM

## 2020-10-30 DIAGNOSIS — Z885 Allergy status to narcotic agent status: Principal | ICD-10-CM

## 2020-10-30 DIAGNOSIS — Z23 Encounter for immunization: Principal | ICD-10-CM

## 2020-10-30 DIAGNOSIS — D849 Immunodeficiency, unspecified: Principal | ICD-10-CM

## 2020-10-30 DIAGNOSIS — N2581 Secondary hyperparathyroidism of renal origin: Principal | ICD-10-CM

## 2020-10-30 DIAGNOSIS — N186 End stage renal disease: Principal | ICD-10-CM

## 2020-10-30 LAB — CBC W/ AUTO DIFF
BASOPHILS ABSOLUTE COUNT: 0.1 10*9/L (ref 0.0–0.1)
BASOPHILS RELATIVE PERCENT: 1.3 %
EOSINOPHILS ABSOLUTE COUNT: 0.1 10*9/L (ref 0.0–0.7)
EOSINOPHILS RELATIVE PERCENT: 2 %
HEMATOCRIT: 33.2 % — ABNORMAL LOW (ref 35.0–44.0)
HEMOGLOBIN: 11 g/dL — ABNORMAL LOW (ref 12.0–15.5)
LYMPHOCYTES ABSOLUTE COUNT: 1.2 10*9/L (ref 0.7–4.0)
LYMPHOCYTES RELATIVE PERCENT: 19.6 %
MEAN CORPUSCULAR HEMOGLOBIN CONC: 33 g/dL (ref 30.0–36.0)
MEAN CORPUSCULAR HEMOGLOBIN: 26.1 pg (ref 26.0–34.0)
MEAN CORPUSCULAR VOLUME: 79 fL — ABNORMAL LOW (ref 82.0–98.0)
MEAN PLATELET VOLUME: 6.1 fL — ABNORMAL LOW (ref 7.0–10.0)
MONOCYTES ABSOLUTE COUNT: 0.6 10*9/L (ref 0.1–1.0)
MONOCYTES RELATIVE PERCENT: 10.1 %
NEUTROPHILS ABSOLUTE COUNT: 4 10*9/L (ref 1.7–7.7)
NEUTROPHILS RELATIVE PERCENT: 67 %
PLATELET COUNT: 351 10*9/L (ref 150–450)
RED BLOOD CELL COUNT: 4.21 10*12/L (ref 3.90–5.03)
RED CELL DISTRIBUTION WIDTH: 14.6 % (ref 12.0–15.0)
WBC ADJUSTED: 5.9 10*9/L (ref 3.5–10.5)

## 2020-10-30 LAB — URINALYSIS
BILIRUBIN UA: NEGATIVE
GLUCOSE UA: NEGATIVE
KETONES UA: NEGATIVE
LEUKOCYTE ESTERASE UA: NEGATIVE
NITRITE UA: NEGATIVE
PH UA: 6.5 (ref 5.0–9.0)
PROTEIN UA: NEGATIVE
RBC UA: <1 /HPF (ref <4)
SPECIFIC GRAVITY UA: 1.01 (ref 1.005–1.030)
SQUAMOUS EPITHELIAL: <1 /HPF (ref 0–5)
UROBILINOGEN UA: 0.2
WBC UA: 3 /HPF (ref 0–5)

## 2020-10-30 LAB — COMPREHENSIVE METABOLIC PANEL
ALBUMIN: 4.1 g/dL (ref 3.4–5.0)
ALKALINE PHOSPHATASE: 76 U/L (ref 46–116)
ALT (SGPT): 63 U/L — ABNORMAL HIGH (ref 10–49)
ANION GAP: 6 mmol/L (ref 5–14)
AST (SGOT): 40 U/L — ABNORMAL HIGH (ref <=34)
BILIRUBIN TOTAL: 0.3 mg/dL (ref 0.3–1.2)
BLOOD UREA NITROGEN: 15 mg/dL (ref 9–23)
BUN / CREAT RATIO: 13
CALCIUM: 10.3 mg/dL (ref 8.7–10.4)
CHLORIDE: 100 mmol/L (ref 98–107)
CO2: 30.8 mmol/L (ref 20.0–31.0)
CREATININE: 1.19 mg/dL — ABNORMAL HIGH
EGFR CKD-EPI AA FEMALE: 66 mL/min/{1.73_m2} (ref >=60)
EGFR CKD-EPI NON-AA FEMALE: 57 mL/min/{1.73_m2} — ABNORMAL LOW (ref >=60)
GLUCOSE RANDOM: 102 mg/dL (ref 70–179)
POTASSIUM: 3 mmol/L — ABNORMAL LOW (ref 3.4–4.5)
PROTEIN TOTAL: 6.7 g/dL (ref 5.7–8.2)
SODIUM: 137 mmol/L (ref 135–145)

## 2020-10-30 LAB — PROTEIN / CREATININE RATIO, URINE
CREATININE, URINE: 23.2 mg/dL
PROTEIN URINE: 11.9 mg/dL
PROTEIN/CREAT RATIO, URINE: 0.513

## 2020-10-30 LAB — MAGNESIUM: MAGNESIUM: 2 mg/dL (ref 1.6–2.6)

## 2020-10-30 LAB — HEMOGLOBIN A1C
ESTIMATED AVERAGE GLUCOSE: 100 mg/dL
HEMOGLOBIN A1C: 5.1 % (ref 4.8–5.6)

## 2020-10-30 MED ORDER — POTASSIUM CHLORIDE ER 20 MEQ TABLET,EXTENDED RELEASE(PART/CRYST)
ORAL_TABLET | Freq: Two times a day (BID) | ORAL | 0 refills | 3.00000 days | Status: CP
Start: 2020-10-30 — End: 2020-11-02

## 2020-10-30 MED ORDER — CHLORTHALIDONE 25 MG TABLET
ORAL_TABLET | Freq: Every day | ORAL | 11 refills | 30 days | Status: CP
Start: 2020-10-30 — End: 2021-10-30

## 2020-10-30 NOTE — Unmapped (Signed)
AOBP:  Right  arm  Large cuff     1st reading: 135/88  87  2nd reading: 145/85  85  3rd reading:  127/86  84    Average:  136/86 85

## 2020-10-30 NOTE — Unmapped (Signed)
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  2nd kidney transplant - 2000-2002 - DD failed chronic rejection.  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 Her last dialysis was on 03/07/19.  Has a good U.O.    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    Creatinine - value 1.19, stable  Urine analysis: protein negative / WBC 3 / RBC <1 .  Urine protein/creatinine ratio: 0.335  DSA: negative 06/19/20    Last CMV checked: <50 10/09/20  Last BKV checked: Not detected 06/19/20    2) Immunosuppression: Myfortic 720 mg BID / Prednisone 5 mg daily / Belatacept monthly (last on 10/09/20)  - Changes in Immunosuppression - none   - On belatacept due to biopsy findings.  - Medications side effects: No     3) UTI  - History of recent recurrent UTI's. No UTI symptoms today.   - Previously referred to urogyn given risk of future antibiotic resistance    4) Hypotension - Controlled - off midodrine now.  Goal for B.P > 100 systolic.   Changes in B.P medications - none.    5) Hypertension - BP 136/86 in clinic today.   - Stable, no changes.    6) Anemia - stable - Hgb 11.0  Goal for Hemoglobin > 11.0  Ferritin 14.6 (09/04/19)   - Continue iron supplement once daily. Pt is not comfortable with increasing on iron supplement dose. She will work on incorporating iron-rich foods into her diet.   - Vit C supplements     7) MBD - Calcium -WNL/ Phosphorus 2.4 - Secondary hyperparathyroidism  On calcitriol 0.25 mcg daily    8) Electrolytes: Hypokalemia present 3.0 - due to diuretics - start Kcl 40 meq twice daily daily for 3 days.    9) Leg swelling   - On diuretic (chlorthalidone) - increase to 25 mg.     10) Immunizations:  Influenza (inactivated only): received 07/2019  Pneumococcal vaccination (inactivated only): will ask patient  Covid-19 [employer]): 02/05/20. 01/15/20. 08/2020.    11) Cancer screening:  PAP smear - 11/06/19 negative   Mammogram - Age 81.    12) Left UE swelling - patient following up in lymphedema clinic every 6 months.     13) High Cholesterol - Pt was previously on pravastatin 20 mg daily, but d/t all over muscle twitching discontinued it three weeks later.   On Zetia 10 mg daily.    14) Anxiety   -Pt previously endorsed severe night sweats while on Celexa   -On Zoloft 50 mg daily - stable, no changes.     Follow up: 3 months or sooner if indicated.    History of Presenting Illness:   Since last seen, patient continues to receive belatacept infusions (on 02/28/20, 03/27/20, 04/24/20, and 05/22/20).    Patient was admitted from 05/06/20-05/08/20 for  chief complaint of fever. WBC elevated to 17 on admission. Given vancomycin and aztreonam in the ER. Respiratory pathology panel negative. Chest XR clear. Exam and history did not reveal any sources for infection. Discharged on ciprofloxacin 500 mg daily x 5 days.     She presents today with c/o increased edema - weight has not changed much, however she feels a lot bloated. Denies chest pain or tightness. No nausea, vomiting fevers chills headaches lightheadedness or dizziness. NO dysuria or hematuria.       Diabetes: No   HTN: Yes: Hypotension    Controlled:Yes   Adherence      With Medication: yes    With Follow up: yes    Functional Status: independent    Patient was found to have reflux nephropathy at age 31 and was on peritoneal dialysis briefly before receiving a living related renal transplant from mother. She had some thrombotic complication and underwent nephrectomy in 1997. She started dialysis and had a second kidney transplant at age 58 in 64. 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 She received a 3rd kidney transplant in 11/2002 which failed in 2007 due to chronic rejection.    She has decreased vision both eyes.    Review of Systems:     Fever or chills: negative   Sore throat: negative   Fatigue/malaise: negative   Weight loss or gain: negative   New skin rash/lump or bump: negative   Problems with teeth/gums: negative   Chest pain: negative   Cough or shortness of breath: negative   Swelling: +Leg swelling.  Abdominal pain/heartburn/nausea/vomiting or diarrhea: negative   Pain or bleeding when urinating: negative   Twitching/numbness or weakness: negative     Physical Exam:     Vital signs: BP 136/86 (BP Site: R Arm, BP Position: Sitting, BP Cuff Size: Large)  - Pulse 85  - Temp 35.9 ??C (96.7 ??F) (Temporal)  - Ht 162.6 cm (5' 4.02)  - Wt 90.9 kg (200 lb 6.4 oz)  - BMI 34.38 kg/m??    Nursing note and vitals reviewed.   Constitutional: Oriented to person, place, and time. Appears well-developed and well-nourished. No distress.   HENT: Patient wearing mask  Head: Normocephalic and atraumatic.   Eyes: Right eye exhibits no discharge. Left eye exhibits no discharge. No scleral icterus.   Neck: wearing mask  Cardiovascular: Normal rate and regular rhythm.  No murmur heard.   Pulmonary/Chest: Effort normal and breath sounds normal. No respiratory distress.   Abdominal: Soft. Non tender  Musculoskeletal: Normal range of motion. Trace edema B/L L.E.    Neurological: Alert and oriented to person, place, and time.   Psychiatric: Normal mood and affect.     Renal Transplant History:    Race:  Caucasian   Age of recipient (at time of transplant): 40 y.o.   Cause of kidney disease: Reflux nephropathy   Native biopsy: No    Date of transplant: 12/05/18   Type of transplant: KDPI - 39%    - Donor creatinine: 1.3    - Any co morbidities: No    - Infection in donor: No    - HLA Mismatch: N/A    - Ischemia time: 6h 4m    - Crossmatch: negative    - Donor kidney biopsy: No diagnostic abnormalities identified (12/05/18)     Induction: Campath - alemtuzumab   Maintenance IS at the time of transplant: tacrolimus, mycophenolate  DGF: Yes   Diabetes Onset after transplant: No    Allergies:   Allergies   Allergen Reactions   ??? Ancef [Cefazolin] Shortness Of Breath   ??? Adhesive Tape-Silicones      Other reaction(s): Other (See Comments)  Uncoded Allergy. Allergen: plastic tape, Other Reaction: blistering   ??? Demerol [Meperidine] Nausea And Vomiting        Current Medications:   Current Outpatient Medications   Medication Sig Dispense Refill   ??? acetaminophen (TYLENOL) 500 MG tablet Take 2 tablets (1,000 mg total) by mouth every six (6) hours as needed for pain. 100 tablet 0   ??? ascorbic acid, vitamin C, (ASCORBIC ACID) 500 MG tablet Take 500 mg by mouth daily.     ??? aspirin (ECOTRIN) 81 MG tablet Take 1 tablet (81 mg total) by mouth daily. 30 tablet 0   ??? calcitrioL (ROCALTROL) 0.25 MCG capsule Take 1 capsule (0.25 mcg total) by mouth daily. 30 capsule 11   ??? carboxymethylcellulose sodium (REFRESH CELLUVISC) 1 % DpGe Instill drops in affected eye(s) as directed as needed.     ??? chlorthalidone (HYGROTON) 25 MG tablet TAKE 0.5 TABLETS (12.5 MG TOTAL) BY MOUTH DAILY AS NEEDED. 30 tablet 11   ??? ezetimibe (ZETIA) 10 mg tablet Take 1 tablet (10 mg total) by mouth daily. 90 tablet 3   ??? ferrous sulfate 325 (65 FE) MG tablet Take 325 mg by mouth daily.     ??? MYFORTIC 180 mg EC tablet Take 4 tablets (720 mg total) by mouth Two (2) times a day. 240 tablet 11   ??? omeprazole (PRILOSEC) 40 MG capsule Take 1 capsule (40 mg total) by mouth once daily 30 capsule 11   ??? predniSONE (DELTASONE) 2.5 MG tablet Take 2 tablets (5 mg total) by mouth daily. 60 tablet 11   ??? sertraline (ZOLOFT) 50 MG tablet Take 1 tablet (50 mg total) by mouth daily. 90 tablet 3     No current facility-administered medications for this visit.       Past Medical History:   Past Medical History:   Diagnosis Date   ??? Bell's palsy    ??? Disease of thyroid gland    ??? ESRD (end stage renal disease) (CMS-HCC)    ??? Nonarteritic ischemic optic neuropathy    ??? Reflux nephropathy         Laboratory studies:     Recent Results (from the past 170 hour(s))   CBC w/ Differential    Collection Time: 10/30/20 10:47 AM   Result Value Ref Range    WBC 5.9 3.5 - 10.5 10*9/L    RBC 4.21 3.90 - 5.03 10*12/L    HGB 11.0 (L) 12.0 - 15.5 g/dL    HCT 16.1 (L) 09.6 - 44.0 %    MCV 79.0 (L) 82.0 - 98.0 fL    MCH 26.1 26.0 - 34.0 pg    MCHC 33.0 30.0 - 36.0 g/dL    RDW 04.5 40.9 - 81.1 %    MPV 6.1 (L) 7.0 - 10.0 fL    Platelet 351 150 - 450 10*9/L    Neutrophils % 67.0 %    Lymphocytes % 19.6 %    Monocytes % 10.1 %    Eosinophils % 2.0 %    Basophils % 1.3 %    Absolute Neutrophils 4.0 1.7 - 7.7 10*9/L    Absolute Lymphocytes 1.2 0.7 - 4.0 10*9/L    Absolute Monocytes 0.6 0.1 - 1.0 10*9/L  Absolute Eosinophils 0.1 0.0 - 0.7 10*9/L    Absolute Basophils 0.1 0.0 - 0.1 10*9/L   Urinalysis    Collection Time: 10/30/20 11:14 AM   Result Value Ref Range    Color, UA Yellow     Clarity, UA Clear     Specific Gravity, UA 1.010 1.005 - 1.030    pH, UA 6.5 5.0 - 9.0    Leukocyte Esterase, UA Negative Negative    Nitrite, UA Negative Negative    Protein, UA Negative Negative    Glucose, UA Negative Negative    Ketones, UA Negative Negative    Urobilinogen, UA 0.2 mg/dL 0.2 - 2.0 mg/dL    Bilirubin, UA Negative Negative    Blood, UA Small (A) Negative     Leeroy Bock, MD

## 2020-10-30 NOTE — Unmapped (Signed)
Transplant Coordinator, Clinic Visit   Pt seen today by transplant nephrology for follow up, reviewed medications and symptoms.          10/30/20 1110   BP: 136/86   Pulse: 85   Temp: 35.9 ??C (96.7 ??F)   Weight: 90.9 kg (200 lb 6.4 oz)   Height: 162.6 cm (5' 4.02)   PainSc: 0-No pain     This covering coordinator met with patient today in clinic     Assessment  BP: normal   Lightheaded: none  BG: n/a  Headache: none  Hand tremors: none  Numbness/tingling: none  Fevers: none  Chills/sweats: none  Shortness of breath: none  Chest pain or pressure: none  Palpitations: none  Abdominal pain: none  Heart burn: slightly the past couple of week.   Nausea/vomiting: none  Diarrhea/constipation: none  UTI symptoms: none  Swelling: on- going swelling, past few months been getting worse. Generalized edema   Sleep: sleeps ok  Pain: none    Good appetite; reports adequate hydration.     Intake: 2.5-3 L of fluids daily   Output: voiding normally     Any new medications? Vitamin C   Immunosuppressant last taken: not on tacorlimus      Immunization status: 3 pfizer doses of Cvoid given    Functional Score: 100   Normal no complaints; no evidence of  disease.   Employment status is: on disability    I spent a total of 5 minutes with Kimberly [name] reviewing medications and symptoms.      Medication changes to include: potassium supplement 40 meq BID for 3 days and chlorthalidone 25 mg daily   [facility]    RTC in 3 months.   No other needs for primary coordinator at this time

## 2020-11-02 LAB — CMV DNA, QUANTITATIVE, PCR
CMV QUANT: <50 [IU]/mL — ABNORMAL HIGH (ref <0)
CMV VIRAL LD: DETECTED — AB

## 2020-11-02 LAB — VITAMIN D 25 HYDROXY: VITAMIN D, TOTAL (25OH): 8.7 ng/mL — ABNORMAL LOW (ref 20.0–80.0)

## 2020-11-03 DIAGNOSIS — Z79899 Other long term (current) drug therapy: Principal | ICD-10-CM

## 2020-11-03 DIAGNOSIS — Z940 Kidney transplant status: Principal | ICD-10-CM

## 2020-11-03 MED ORDER — ERGOCALCIFEROL (VITAMIN D2) 1,250 MCG (50,000 UNIT) CAPSULE
ORAL_CAPSULE | ORAL | 11 refills | 84 days | Status: CP
Start: 2020-11-03 — End: 2021-01-20

## 2020-11-03 NOTE — Unmapped (Signed)
Reviewed Vitamin D level with Dr. Nestor Lewandowsky- order for Vitamin D 50,000 units weekly for 12 weeks given    Called and spoke with pt, she was made aware of Vitamin D prescription and [facility]

## 2020-11-05 LAB — HLA DS POST TRANSPLANT
ANTI-DONOR DRW #1 MFI: 170 MFI
ANTI-DONOR DRW #2 MFI: 0 MFI
ANTI-DONOR HLA-A #1 MFI: 57 MFI
ANTI-DONOR HLA-A #2 MFI: 282 MFI
ANTI-DONOR HLA-B #1 MFI: 1399 MFI — ABNORMAL HIGH
ANTI-DONOR HLA-B #2 MFI: 104 MFI
ANTI-DONOR HLA-C #1 MFI: 185 MFI
ANTI-DONOR HLA-C #2 MFI: 28 MFI
ANTI-DONOR HLA-DP AG #1 MFI: 120 MFI
ANTI-DONOR HLA-DQB #1 MFI: 0 MFI
ANTI-DONOR HLA-DQB #2 MFI: 18 MFI
ANTI-DONOR HLA-DR #1 MFI: 10 MFI
ANTI-DONOR HLA-DR #2 MFI: 105 MFI

## 2020-11-05 LAB — FSAB CLASS 1 ANTIBODY SPECIFICITY: HLA CLASS 1 ANTIBODY RESULT: POSITIVE

## 2020-11-05 LAB — FSAB CLASS 2 ANTIBODY SPECIFICITY: HLA CL2 AB RESULT: POSITIVE

## 2020-11-06 NOTE — Unmapped (Signed)
[facility] Clinical Assessment & Refill Coordination Note    Kimberly [name], DOB: [birth year]  Phone: [phone] (home)     All above HIPAA information was verified with patient.     Was a Nurse, learning disability used for this call? No    Specialty Medication(s):   Transplant: Myfortic 180mg  and Prednisone 2.5mg      Current Outpatient Medications   Medication Sig Dispense Refill   ??? acetaminophen (TYLENOL) 500 MG tablet Take 2 tablets (1,000 mg total) by mouth every six (6) hours as needed for pain. 100 tablet 0   ??? ascorbic acid, vitamin C, (ASCORBIC ACID) 500 MG tablet Take 500 mg by mouth daily.     ??? aspirin (ECOTRIN) 81 MG tablet Take 1 tablet (81 mg total) by mouth daily. 30 tablet 0   ??? calcitrioL (ROCALTROL) 0.25 MCG capsule Take 1 capsule (0.25 mcg total) by mouth daily. 30 capsule 11   ??? carboxymethylcellulose sodium (REFRESH CELLUVISC) 1 % DpGe Instill drops in affected eye(s) as directed as needed.     ??? chlorthalidone (HYGROTON) 25 MG tablet Take 1 tablet (25 mg total) by mouth daily. 30 tablet 11   ??? ergocalciferol-1,250 mcg, 50,000 unit, (VITAMIN D2-1,250 MCG, 50,000 UNIT,) 1,250 mcg (50,000 unit) capsule Take 1 capsule (1,250 mcg total) by mouth once a week for 12 doses. 12 capsule 11   ??? ezetimibe (ZETIA) 10 mg tablet Take 1 tablet (10 mg total) by mouth daily. 90 tablet 3   ??? ferrous sulfate 325 (65 FE) MG tablet Take 325 mg by mouth daily.     ??? MYFORTIC 180 mg EC tablet Take 4 tablets (720 mg total) by mouth Two (2) times a day. 240 tablet 11   ??? omeprazole (PRILOSEC) 40 MG capsule Take 1 capsule (40 mg total) by mouth once daily 30 capsule 11   ??? predniSONE (DELTASONE) 2.5 MG tablet Take 2 tablets (5 mg total) by mouth daily. 60 tablet 11   ??? sertraline (ZOLOFT) 50 MG tablet Take 1 tablet (50 mg total) by mouth daily. 90 tablet 3     No current facility-administered medications for this visit.        Changes to medications: [facility] reports no changes at this time.    Allergies   Allergen Reactions   ??? Ancef [Cefazolin] Shortness Of Breath   ??? Adhesive Tape-Silicones      Other reaction(s): Other (See Comments)  Uncoded Allergy. Allergen: plastic tape, Other Reaction: blistering   ??? Demerol [Meperidine] Nausea And Vomiting       Changes to allergies: No    SPECIALTY MEDICATION ADHERENCE     Myfortic 180 mg: 21 days of medicine on hand   Prednisone 2.5 mg: 21 days of medicine on hand       Medication Adherence    Patient reported X missed doses in the last month: 0  Specialty Medication: Myfortic 180mg   Patient is on additional specialty medications: Yes  Additional Specialty Medications: Prednisone 2.5mg   Patient Reported Additional Medication X Missed Doses in the Last Month: 0  Patient is on more than two specialty medications: No          Specialty medication(s) dose(s) confirmed: Regimen is correct and unchanged.     Are there any concerns with adherence? No    Adherence counseling provided? Not needed    CLINICAL MANAGEMENT AND INTERVENTION      Clinical Benefit Assessment:    Do you feel the medicine is effective  or helping your condition? Yes    Clinical Benefit counseling provided? Not needed    Adverse Effects Assessment:    Are you experiencing any side effects? No    Are you experiencing difficulty administering your medicine? No    Quality of Life Assessment:    How many days over the past month did your kidney transplant  keep you from your normal activities? For example, brushing your teeth or getting up in the morning. 0    Have you discussed this with your provider? Not needed    Therapy Appropriateness:    Is therapy appropriate? Yes, therapy is appropriate and should be continued    DISEASE/MEDICATION-SPECIFIC INFORMATION      N/A    PATIENT SPECIFIC NEEDS     - Does the patient have any physical, cognitive, or cultural barriers? No    - Is the patient high risk? Yes, patient is taking a REMS drug. Medication is dispensed in compliance with REMS program    - Does the patient require a Care Management Plan? No     - Does the patient require physician intervention or other additional services (i.e. nutrition, smoking cessation, social work)? No      SHIPPING     Specialty Medication(s) to be Shipped:   Transplant: None- patient declined myfortic and prednisone today.    Other medication(s) to be shipped: No additional medications requested for fill at this time     Changes to insurance: No    Delivery Scheduled: Patient declined refill at this time due to has 3 weeks of medicine on hand..     Medication will be delivered via UPS to the confirmed prescription address in [facility].    The patient will receive a drug information handout for each medication shipped and additional FDA Medication Guides as required.  Verified that patient has previously received a Conservation officer, historic buildings.    All of the patient's questions and concerns have been addressed.    Tera Helper   [facility] Specialty Pharmacist

## 2020-11-11 LAB — BK VIRUS QUANTITATIVE PCR, BLOOD: BK BLOOD RESULT: NOT DETECTED

## 2020-11-13 ENCOUNTER — Ambulatory Visit: Admit: 2020-11-13 | Discharge: 2020-11-14 | Payer: MEDICARE

## 2020-11-13 DIAGNOSIS — Z79899 Other long term (current) drug therapy: Principal | ICD-10-CM

## 2020-11-13 DIAGNOSIS — Z940 Kidney transplant status: Principal | ICD-10-CM

## 2020-11-13 LAB — COMPREHENSIVE METABOLIC PANEL
ALBUMIN: 4.1 g/dL (ref 3.4–5.0)
ALKALINE PHOSPHATASE: 81 U/L (ref 46–116)
ALT (SGPT): 59 U/L — ABNORMAL HIGH (ref 10–49)
ANION GAP: 7 mmol/L (ref 5–14)
AST (SGOT): 31 U/L (ref <=34)
BILIRUBIN TOTAL: 0.5 mg/dL (ref 0.3–1.2)
BLOOD UREA NITROGEN: 18 mg/dL (ref 9–23)
BUN / CREAT RATIO: 14
CALCIUM: 10.6 mg/dL — ABNORMAL HIGH (ref 8.7–10.4)
CHLORIDE: 102 mmol/L (ref 98–107)
CO2: 29.3 mmol/L (ref 20.0–31.0)
CREATININE: 1.29 mg/dL — ABNORMAL HIGH
EGFR CKD-EPI AA FEMALE: 60 mL/min/{1.73_m2} (ref >=60)
EGFR CKD-EPI NON-AA FEMALE: 52 mL/min/{1.73_m2} — ABNORMAL LOW (ref >=60)
GLUCOSE RANDOM: 84 mg/dL (ref 70–179)
POTASSIUM: 3.5 mmol/L (ref 3.4–4.5)
PROTEIN TOTAL: 6.9 g/dL (ref 5.7–8.2)
SODIUM: 138 mmol/L (ref 135–145)

## 2020-11-13 LAB — CBC W/ AUTO DIFF
BASOPHILS ABSOLUTE COUNT: 0 10*9/L (ref 0.0–0.1)
BASOPHILS RELATIVE PERCENT: 0.6 %
EOSINOPHILS ABSOLUTE COUNT: 0.1 10*9/L (ref 0.0–0.7)
EOSINOPHILS RELATIVE PERCENT: 0.8 %
HEMATOCRIT: 34.8 % — ABNORMAL LOW (ref 35.0–44.0)
HEMOGLOBIN: 11.2 g/dL — ABNORMAL LOW (ref 12.0–15.5)
LYMPHOCYTES ABSOLUTE COUNT: 0.9 10*9/L (ref 0.7–4.0)
LYMPHOCYTES RELATIVE PERCENT: 11.5 %
MEAN CORPUSCULAR HEMOGLOBIN CONC: 32.2 g/dL (ref 30.0–36.0)
MEAN CORPUSCULAR HEMOGLOBIN: 26.1 pg (ref 26.0–34.0)
MEAN CORPUSCULAR VOLUME: 81.1 fL — ABNORMAL LOW (ref 82.0–98.0)
MEAN PLATELET VOLUME: 6.2 fL — ABNORMAL LOW (ref 7.0–10.0)
MONOCYTES ABSOLUTE COUNT: 0.5 10*9/L (ref 0.1–1.0)
MONOCYTES RELATIVE PERCENT: 6.7 %
NEUTROPHILS ABSOLUTE COUNT: 6.4 10*9/L (ref 1.7–7.7)
NEUTROPHILS RELATIVE PERCENT: 80.4 %
PLATELET COUNT: 332 10*9/L (ref 150–450)
RED BLOOD CELL COUNT: 4.29 10*12/L (ref 3.90–5.03)
RED CELL DISTRIBUTION WIDTH: 15.3 % — ABNORMAL HIGH (ref 12.0–15.0)
WBC ADJUSTED: 8 10*9/L (ref 3.5–10.5)

## 2020-11-13 LAB — URINALYSIS
BACTERIA: NONE SEEN /HPF
BILIRUBIN UA: NEGATIVE
GLUCOSE UA: NEGATIVE
KETONES UA: NEGATIVE
LEUKOCYTE ESTERASE UA: NEGATIVE
NITRITE UA: NEGATIVE
PH UA: 6.5 (ref 5.0–9.0)
PROTEIN UA: NEGATIVE
RBC UA: 1 /HPF (ref <4)
SPECIFIC GRAVITY UA: 1.01 (ref 1.005–1.030)
SQUAMOUS EPITHELIAL: <1 /HPF (ref 0–5)
UROBILINOGEN UA: 0.2
WBC UA: <1 /HPF (ref 0–5)

## 2020-11-13 LAB — MAGNESIUM: MAGNESIUM: 1.8 mg/dL (ref 1.6–2.6)

## 2020-11-13 LAB — PHOSPHORUS: PHOSPHORUS: 2 mg/dL — ABNORMAL LOW (ref 2.4–5.1)

## 2020-11-13 MED ADMIN — belatacept (NULOJIX) 462.5 mg in sodium chloride (NS) 0.9 % 100 mL IVPB: 5 mg/kg | INTRAVENOUS | @ 19:00:00 | Stop: 2020-11-13

## 2020-11-13 NOTE — Unmapped (Signed)
1305 Pt arrived for monthly Belatacept. No new symptoms. Urine obtained for lab. IV started. Labs drawn.    1336 Belatacept 462.5 mg infusing over 30 minutes.     1407 Belatacept infusion completed.     1409 PIV removed. Dc'd from infusion center.

## 2020-11-14 LAB — CMV DNA, QUANTITATIVE, PCR
CMV QUANT: <50 [IU]/mL — ABNORMAL HIGH (ref <0)
CMV VIRAL LD: DETECTED — AB

## 2020-11-23 MED ORDER — OMEPRAZOLE 40 MG CAPSULE,DELAYED RELEASE
ORAL_CAPSULE | Freq: Every day | ORAL | 11 refills | 30.00000 days
Start: 2020-11-23 — End: ?

## 2020-11-23 NOTE — Unmapped (Signed)
[facility] Refill Coordination Note    Specialty Medication(s) to be Shipped:   Transplant: Myfortic 180mg     Other medication(s) to be shipped: Calitriol, omeprazole, prednisone     Kimberly [name], DOB: 20-Nov-1980  Phone: [phone] (home)       All above HIPAA information was verified with patient.     Was a Nurse, learning disability used for this call? No    Completed refill call assessment today to schedule patient's medication shipment from the [facility] [phone]).       Specialty medication(s) and dose(s) confirmed: Regimen is correct and unchanged.   Changes to medications: [name] reports no changes at this time.  Changes to insurance: No  Questions for the pharmacist: No    Confirmed patient received Welcome Packet with first shipment. The patient will receive a drug information handout for each medication shipped and additional FDA Medication Guides as required.       DISEASE/MEDICATION-SPECIFIC INFORMATION        N/A    SPECIALTY MEDICATION ADHERENCE     Medication Adherence    Patient reported X missed doses in the last month: 0  Specialty Medication: Myfortic 180  Patient is on additional specialty medications: No  Patient is on more than two specialty medications: No  Any gaps in refill history greater than 2 weeks in the last 3 months: no  Demonstrates understanding of importance of adherence: yes  Informant: patient                Myfortic 180mg : Patient has 11 days of medication on hand       SHIPPING     Shipping address confirmed in Epic.     Delivery Scheduled: Yes, Expected medication delivery date: 2/4.     Medication will be delivered via UPS to the prescription address in Epic WAM.    Olga Millers   [facility] Specialty Technician

## 2020-11-24 MED ORDER — OMEPRAZOLE 40 MG CAPSULE,DELAYED RELEASE
ORAL_CAPSULE | Freq: Every day | ORAL | 0 refills | 30.00000 days
Start: 2020-11-24 — End: ?

## 2020-11-26 MED FILL — OMEPRAZOLE 40 MG CAPSULE,DELAYED RELEASE: ORAL | 30 days supply | Qty: 30 | Fill #0

## 2020-11-26 MED FILL — PREDNISONE 2.5 MG TABLET: ORAL | 30 days supply | Qty: 60 | Fill #2

## 2020-11-26 MED FILL — CALCITRIOL 0.25 MCG CAPSULE: ORAL | 30 days supply | Qty: 30 | Fill #11

## 2020-11-26 MED FILL — MYFORTIC 180 MG TABLET,DELAYED RELEASE: ORAL | 30 days supply | Qty: 240 | Fill #6

## 2020-12-01 DIAGNOSIS — Z940 Kidney transplant status: Principal | ICD-10-CM

## 2020-12-01 DIAGNOSIS — Z79899 Other long term (current) drug therapy: Principal | ICD-10-CM

## 2020-12-11 ENCOUNTER — Ambulatory Visit: Admit: 2020-12-11 | Discharge: 2020-12-12 | Payer: MEDICARE

## 2020-12-11 LAB — COMPREHENSIVE METABOLIC PANEL
ALBUMIN: 4.2 g/dL (ref 3.4–5.0)
ALKALINE PHOSPHATASE: 83 U/L (ref 46–116)
ALT (SGPT): 56 U/L — ABNORMAL HIGH (ref 10–49)
ANION GAP: 8 mmol/L (ref 5–14)
AST (SGOT): 27 U/L (ref <=34)
BILIRUBIN TOTAL: 0.4 mg/dL (ref 0.3–1.2)
BLOOD UREA NITROGEN: 16 mg/dL (ref 9–23)
BUN / CREAT RATIO: 13
CALCIUM: 10.7 mg/dL — ABNORMAL HIGH (ref 8.7–10.4)
CHLORIDE: 102 mmol/L (ref 98–107)
CO2: 26.7 mmol/L (ref 20.0–31.0)
CREATININE: 1.28 mg/dL — ABNORMAL HIGH
EGFR CKD-EPI AA FEMALE: 60 mL/min/{1.73_m2} (ref >=60)
EGFR CKD-EPI NON-AA FEMALE: 52 mL/min/{1.73_m2} — ABNORMAL LOW (ref >=60)
GLUCOSE RANDOM: 99 mg/dL (ref 70–179)
POTASSIUM: 3.5 mmol/L (ref 3.4–4.5)
PROTEIN TOTAL: 6.7 g/dL (ref 5.7–8.2)
SODIUM: 137 mmol/L (ref 135–145)

## 2020-12-11 LAB — URINALYSIS
BACTERIA: NONE SEEN /HPF
BILIRUBIN UA: NEGATIVE
GLUCOSE UA: NEGATIVE
KETONES UA: NEGATIVE
LEUKOCYTE ESTERASE UA: NEGATIVE
NITRITE UA: NEGATIVE
PH UA: 6 (ref 5.0–9.0)
PROTEIN UA: NEGATIVE
RBC UA: <1 /HPF (ref <4)
RENAL TUBULAR EPITHELIAL CELLS: 2 /HPF — ABNORMAL HIGH
SPECIFIC GRAVITY UA: <=1.005 (ref 1.005–1.030)
SQUAMOUS EPITHELIAL: <1 /HPF (ref 0–5)
UROBILINOGEN UA: 0.2
WBC UA: 2 /HPF (ref 0–5)

## 2020-12-11 LAB — CBC W/ AUTO DIFF
BASOPHILS ABSOLUTE COUNT: 0.1 10*9/L (ref 0.0–0.1)
BASOPHILS RELATIVE PERCENT: 0.7 %
EOSINOPHILS ABSOLUTE COUNT: 0.1 10*9/L (ref 0.0–0.7)
EOSINOPHILS RELATIVE PERCENT: 1.2 %
HEMATOCRIT: 36.1 % (ref 35.0–44.0)
HEMOGLOBIN: 12 g/dL (ref 12.0–15.5)
LYMPHOCYTES ABSOLUTE COUNT: 1.3 10*9/L (ref 0.7–4.0)
LYMPHOCYTES RELATIVE PERCENT: 17.3 %
MEAN CORPUSCULAR HEMOGLOBIN CONC: 33.3 g/dL (ref 30.0–36.0)
MEAN CORPUSCULAR HEMOGLOBIN: 27.1 pg (ref 26.0–34.0)
MEAN CORPUSCULAR VOLUME: 81.3 fL — ABNORMAL LOW (ref 82.0–98.0)
MEAN PLATELET VOLUME: 6.2 fL — ABNORMAL LOW (ref 7.0–10.0)
MONOCYTES ABSOLUTE COUNT: 0.7 10*9/L (ref 0.1–1.0)
MONOCYTES RELATIVE PERCENT: 8.7 %
NEUTROPHILS ABSOLUTE COUNT: 5.6 10*9/L (ref 1.7–7.7)
NEUTROPHILS RELATIVE PERCENT: 72.1 %
PLATELET COUNT: 345 10*9/L (ref 150–450)
RED BLOOD CELL COUNT: 4.43 10*12/L (ref 3.90–5.03)
RED CELL DISTRIBUTION WIDTH: 15.7 % — ABNORMAL HIGH (ref 12.0–15.0)
WBC ADJUSTED: 7.7 10*9/L (ref 3.5–10.5)

## 2020-12-11 LAB — PHOSPHORUS: PHOSPHORUS: 2.5 mg/dL (ref 2.4–5.1)

## 2020-12-11 LAB — MAGNESIUM: MAGNESIUM: 1.9 mg/dL (ref 1.6–2.6)

## 2020-12-11 MED ADMIN — belatacept (NULOJIX) 462.5 mg in sodium chloride (NS) 0.9 % 100 mL IVPB: 5 mg/kg | INTRAVENOUS | @ 20:00:00 | Stop: 2020-12-11

## 2020-12-12 NOTE — Unmapped (Signed)
1445??Patient in today for Belatacept??infusion. Patient has no s/s of infection. No issues from the last infusion.Labs drawn and sent to lab.  1526??Belatacept??started ,patient instructed to use call bell /call nurse for any s/s of unsual symptoms during infusion. Patient educated on possible s/s of reaction,such as chestpain ,itching,shortness of breath lightheadedness and any kind of discomfort. Patient verbalized understanding.  1558??Infusion completed and well tolerated.  1615??Belatacept 462.5??mg/100 ml??NS infused over 30 min. per protocol . Pt alert and oriented, offered no complaints during infusion. IV flushed with 10ml NS post infusion. Pt tolerated infusion well.

## 2020-12-15 LAB — CMV DNA, QUANTITATIVE, PCR: CMV VIRAL LD: NOT DETECTED

## 2020-12-21 DIAGNOSIS — Z940 Kidney transplant status: Principal | ICD-10-CM

## 2020-12-21 MED ORDER — CALCITRIOL 0.25 MCG CAPSULE
ORAL_CAPSULE | Freq: Every day | ORAL | 11 refills | 30.00000 days
Start: 2020-12-21 — End: 2021-12-21

## 2020-12-21 MED ORDER — OMEPRAZOLE 40 MG CAPSULE,DELAYED RELEASE
ORAL_CAPSULE | Freq: Every day | ORAL | 0 refills | 30 days
Start: 2020-12-21 — End: ?

## 2020-12-21 NOTE — Unmapped (Signed)
[facility] Refill Coordination Note    Specialty Medication(s) to be Shipped:   Transplant: Myfortic 180mg  and Prednisone 2.5mg     Other medication(s) to be shipped: calcitriol, omeprazole     Kimberly [name], DOB: [birth year]  Phone: [phone] (home)       All above HIPAA information was verified with patient.     Was a Nurse, learning disability used for this call? No    Completed refill call assessment today to schedule patient's medication shipment from the [facility] [phone]).       Specialty medication(s) and dose(s) confirmed: Regimen is correct and unchanged.   Changes to medications: Kimberly [name] reports no changes at this time.  Changes to insurance: No  Questions for the pharmacist: No    Confirmed patient received Welcome Packet with first shipment. The patient will receive a drug information handout for each medication shipped and additional FDA Medication Guides as required.       DISEASE/MEDICATION-SPECIFIC INFORMATION        N/A    SPECIALTY MEDICATION ADHERENCE     Medication Adherence    Patient reported X missed doses in the last month: 0  Specialty Medication: Myfortic 180mg   Patient is on additional specialty medications: Yes  Additional Specialty Medications: Prednisone 2.5mg   Patient Reported Additional Medication X Missed Doses in the Last Month: 0  Patient is on more than two specialty medications: No           Myfortic 180mg  10 days worth of medication on hand.  Prednisone 2.5mg   8 days worth of medication on hand.                SHIPPING     Shipping address confirmed in Epic.     Delivery Scheduled: Yes, Expected medication delivery date: 12/24/20.     Medication will be delivered via UPS to the prescription address in Epic WAM.    Kimberly [name]   [facility] Specialty Technician

## 2020-12-22 MED ORDER — OMEPRAZOLE 40 MG CAPSULE,DELAYED RELEASE
ORAL_CAPSULE | Freq: Every day | ORAL | 0 refills | 30.00000 days
Start: 2020-12-22 — End: ?

## 2020-12-23 MED FILL — OMEPRAZOLE 40 MG CAPSULE,DELAYED RELEASE: ORAL | 30 days supply | Qty: 30 | Fill #0

## 2020-12-23 MED FILL — PREDNISONE 2.5 MG TABLET: ORAL | 30 days supply | Qty: 60 | Fill #3

## 2020-12-23 MED FILL — MYFORTIC 180 MG TABLET,DELAYED RELEASE: ORAL | 30 days supply | Qty: 240 | Fill #7

## 2020-12-23 MED FILL — CALCITRIOL 0.25 MCG CAPSULE: ORAL | 30 days supply | Qty: 30 | Fill #0

## 2021-01-08 ENCOUNTER — Ambulatory Visit: Admit: 2021-01-08 | Discharge: 2021-01-09 | Payer: MEDICARE

## 2021-01-08 LAB — COMPREHENSIVE METABOLIC PANEL
ALBUMIN: 4.3 g/dL (ref 3.4–5.0)
ALKALINE PHOSPHATASE: 79 U/L (ref 46–116)
ALT (SGPT): 62 U/L — ABNORMAL HIGH (ref 10–49)
ANION GAP: 7 mmol/L (ref 5–14)
AST (SGOT): 40 U/L — ABNORMAL HIGH (ref <=34)
BILIRUBIN TOTAL: 0.4 mg/dL (ref 0.3–1.2)
BLOOD UREA NITROGEN: 19 mg/dL (ref 9–23)
BUN / CREAT RATIO: 14
CALCIUM: 10.7 mg/dL — ABNORMAL HIGH (ref 8.7–10.4)
CHLORIDE: 101 mmol/L (ref 98–107)
CO2: 27.2 mmol/L (ref 20.0–31.0)
CREATININE: 1.39 mg/dL — ABNORMAL HIGH
EGFR CKD-EPI AA FEMALE: 55 mL/min/{1.73_m2} — ABNORMAL LOW (ref >=60)
EGFR CKD-EPI NON-AA FEMALE: 47 mL/min/{1.73_m2} — ABNORMAL LOW (ref >=60)
GLUCOSE RANDOM: 96 mg/dL (ref 70–179)
POTASSIUM: 3.4 mmol/L (ref 3.4–4.8)
PROTEIN TOTAL: 6.7 g/dL (ref 5.7–8.2)
SODIUM: 135 mmol/L (ref 135–145)

## 2021-01-08 LAB — URINALYSIS
BACTERIA: NONE SEEN /HPF
BILIRUBIN UA: NEGATIVE
GLUCOSE UA: NEGATIVE
KETONES UA: NEGATIVE
LEUKOCYTE ESTERASE UA: NEGATIVE
NITRITE UA: NEGATIVE
PH UA: 6 (ref 5.0–9.0)
PROTEIN UA: NEGATIVE
RBC UA: 2 /HPF (ref <4)
SPECIFIC GRAVITY UA: <=1.005 (ref 1.005–1.030)
SQUAMOUS EPITHELIAL: <1 /HPF (ref 0–5)
UROBILINOGEN UA: 0.2
WBC UA: <1 /HPF (ref 0–5)

## 2021-01-08 LAB — MAGNESIUM: MAGNESIUM: 1.9 mg/dL (ref 1.6–2.6)

## 2021-01-08 LAB — CBC W/ AUTO DIFF
BASOPHILS ABSOLUTE COUNT: 0.1 10*9/L (ref 0.0–0.1)
BASOPHILS RELATIVE PERCENT: 0.7 %
EOSINOPHILS ABSOLUTE COUNT: 0.1 10*9/L (ref 0.0–0.5)
EOSINOPHILS RELATIVE PERCENT: 0.8 %
HEMATOCRIT: 36.5 % (ref 34.0–44.0)
HEMOGLOBIN: 12.5 g/dL (ref 11.3–14.9)
LYMPHOCYTES ABSOLUTE COUNT: 1.4 10*9/L (ref 1.1–3.6)
LYMPHOCYTES RELATIVE PERCENT: 16.5 %
MEAN CORPUSCULAR HEMOGLOBIN CONC: 34.2 g/dL (ref 32.0–36.0)
MEAN CORPUSCULAR HEMOGLOBIN: 27.2 pg (ref 25.9–32.4)
MEAN CORPUSCULAR VOLUME: 79.5 fL (ref 77.6–95.7)
MEAN PLATELET VOLUME: 6.2 fL — ABNORMAL LOW (ref 6.8–10.7)
MONOCYTES ABSOLUTE COUNT: 0.6 10*9/L (ref 0.3–0.8)
MONOCYTES RELATIVE PERCENT: 7.2 %
NEUTROPHILS ABSOLUTE COUNT: 6.2 10*9/L (ref 1.8–7.8)
NEUTROPHILS RELATIVE PERCENT: 74.8 %
PLATELET COUNT: 342 10*9/L (ref 150–450)
RED BLOOD CELL COUNT: 4.6 10*12/L (ref 3.95–5.13)
RED CELL DISTRIBUTION WIDTH: 14.3 % (ref 12.2–15.2)
WBC ADJUSTED: 8.2 10*9/L (ref 3.6–11.2)

## 2021-01-08 LAB — PHOSPHORUS: PHOSPHORUS: 2.7 mg/dL (ref 2.4–5.1)

## 2021-01-08 MED ADMIN — belatacept (NULOJIX) 462.5 mg in sodium chloride (NS) 0.9 % 100 mL IVPB: 5 mg/kg | INTRAVENOUS | @ 19:00:00 | Stop: 2021-01-08

## 2021-01-08 NOTE — Unmapped (Signed)
1445??Patient in today for Belatacept??infusion. Patient has no s/s of infection. No issues from the last infusion.Labs drawn and sent to lab.  1511??Belatacept??started ,patient instructed to use call bell /call nurse for any s/s of unsual symptoms during infusion. Patient educated on possible s/s of reaction,such as chestpain ,itching,shortness of breath lightheadedness and any kind of discomfort. Patient verbalized understanding.  1540??Infusion completed and well tolerated.  1555??Belatacept 462.5??mg/100 ml??NS infused over??30 min.??per protocol . Pt alert and oriented, offered no complaints during infusion. IV flushed with 10ml NS post infusion. Pt tolerated infusion well.

## 2021-01-11 LAB — CMV DNA, QUANTITATIVE, PCR: CMV VIRAL LD: NOT DETECTED

## 2021-01-24 DIAGNOSIS — Z79899 Other long term (current) drug therapy: Principal | ICD-10-CM

## 2021-01-24 DIAGNOSIS — Z940 Kidney transplant status: Principal | ICD-10-CM

## 2021-01-25 ENCOUNTER — Ambulatory Visit: Admit: 2021-01-25 | Discharge: 2021-01-26 | Payer: MEDICARE

## 2021-01-25 MED FILL — MYFORTIC 180 MG TABLET,DELAYED RELEASE: ORAL | 30 days supply | Qty: 240 | Fill #8

## 2021-01-25 MED FILL — PREDNISONE 2.5 MG TABLET: ORAL | 30 days supply | Qty: 60 | Fill #4

## 2021-01-25 MED FILL — CALCITRIOL 0.25 MCG CAPSULE: ORAL | 30 days supply | Qty: 30 | Fill #1

## 2021-01-25 NOTE — Unmapped (Signed)
[facility] Refill Coordination Note    Specialty Medication(s) to be Shipped:   Transplant: Myfortic 180mg  and Prednisone 2.5mg     Other medication(s) to be shipped: calcitriol     Kimberly [name], DOB: January 20, 1981  Phone: [phone] (home)       All above HIPAA information was verified with patient.     Was a Nurse, learning disability used for this call? No    Completed refill call assessment today to schedule patient's medication shipment from the [facility] [phone]).       Specialty medication(s) and dose(s) confirmed: Regimen is correct and unchanged.   Changes to medications: Kimberly [name] reports no changes at this time.  Changes to insurance: No  Questions for the pharmacist: No    Confirmed patient received a Conservation officer, historic buildings and a Surveyor, mining with first shipment. The patient will receive a drug information handout for each medication shipped and additional FDA Medication Guides as required.       DISEASE/MEDICATION-SPECIFIC INFORMATION        N/A    SPECIALTY MEDICATION ADHERENCE     Medication Adherence    Patient reported X missed doses in the last month: 0  Specialty Medication: Myfortic 180mg   Patient is on additional specialty medications: Yes  Additional Specialty Medications: Prednisone 2.5mg   Patient Reported Additional Medication X Missed Doses in the Last Month: 0  Patient is on more than two specialty medications: No        Myfortic 180 mg: 5 days of medicine on hand   Prednisone 5 mg: 5 days of medicine on hand     SHIPPING     Shipping address confirmed in Epic.     Delivery Scheduled: Yes, Expected medication delivery date: 01/26/2021.     Medication will be delivered via UPS to the prescription address in Epic WAM.    [name] Specialty Technician

## 2021-02-05 ENCOUNTER — Ambulatory Visit: Admit: 2021-02-05 | Discharge: 2021-02-06 | Payer: MEDICARE

## 2021-02-05 LAB — CBC W/ AUTO DIFF
BASOPHILS ABSOLUTE COUNT: 0.1 10*9/L (ref 0.0–0.1)
BASOPHILS RELATIVE PERCENT: 1 %
EOSINOPHILS ABSOLUTE COUNT: 0 10*9/L (ref 0.0–0.5)
EOSINOPHILS RELATIVE PERCENT: 0.3 %
HEMATOCRIT: 36.4 % (ref 34.0–44.0)
HEMOGLOBIN: 12.5 g/dL (ref 11.3–14.9)
LYMPHOCYTES ABSOLUTE COUNT: 1.2 10*9/L (ref 1.1–3.6)
LYMPHOCYTES RELATIVE PERCENT: 11.7 %
MEAN CORPUSCULAR HEMOGLOBIN CONC: 34.4 g/dL (ref 32.0–36.0)
MEAN CORPUSCULAR HEMOGLOBIN: 26.8 pg (ref 25.9–32.4)
MEAN CORPUSCULAR VOLUME: 77.8 fL (ref 77.6–95.7)
MEAN PLATELET VOLUME: 6.6 fL — ABNORMAL LOW (ref 6.8–10.7)
MONOCYTES ABSOLUTE COUNT: 0.4 10*9/L (ref 0.3–0.8)
MONOCYTES RELATIVE PERCENT: 4 %
NEUTROPHILS ABSOLUTE COUNT: 8.7 10*9/L — ABNORMAL HIGH (ref 1.8–7.8)
NEUTROPHILS RELATIVE PERCENT: 83 %
NUCLEATED RED BLOOD CELLS: 0 /100{WBCs} (ref <=4)
PLATELET COUNT: 307 10*9/L (ref 150–450)
RED BLOOD CELL COUNT: 4.68 10*12/L (ref 3.95–5.13)
RED CELL DISTRIBUTION WIDTH: 13.8 % (ref 12.2–15.2)
WBC ADJUSTED: 10.5 10*9/L (ref 3.6–11.2)

## 2021-02-05 LAB — COMPREHENSIVE METABOLIC PANEL
ALBUMIN: 3.8 g/dL (ref 3.4–5.0)
ALKALINE PHOSPHATASE: 78 U/L (ref 46–116)
ALT (SGPT): 69 U/L — ABNORMAL HIGH (ref 10–49)
ANION GAP: 7 mmol/L (ref 5–14)
AST (SGOT): 31 U/L (ref <=34)
BILIRUBIN TOTAL: 0.5 mg/dL (ref 0.3–1.2)
BLOOD UREA NITROGEN: 22 mg/dL (ref 9–23)
BUN / CREAT RATIO: 17
CALCIUM: 10.7 mg/dL — ABNORMAL HIGH (ref 8.7–10.4)
CHLORIDE: 101 mmol/L (ref 98–107)
CO2: 27.3 mmol/L (ref 20.0–31.0)
CREATININE: 1.29 mg/dL — ABNORMAL HIGH
EGFR CKD-EPI AA FEMALE: 60 mL/min/{1.73_m2} (ref >=60)
EGFR CKD-EPI NON-AA FEMALE: 52 mL/min/{1.73_m2} — ABNORMAL LOW (ref >=60)
GLUCOSE RANDOM: 94 mg/dL (ref 70–179)
POTASSIUM: 3.5 mmol/L (ref 3.5–5.1)
PROTEIN TOTAL: 6.7 g/dL (ref 5.7–8.2)
SODIUM: 135 mmol/L (ref 135–145)

## 2021-02-05 LAB — URINALYSIS
BILIRUBIN UA: NEGATIVE
GLUCOSE UA: NEGATIVE
KETONES UA: NEGATIVE
LEUKOCYTE ESTERASE UA: NEGATIVE
NITRITE UA: NEGATIVE
PH UA: 6 (ref 5.0–9.0)
RBC UA: 2 /HPF (ref <4)
SPECIFIC GRAVITY UA: 1.01 (ref 1.005–1.030)
SQUAMOUS EPITHELIAL: <1 /HPF (ref 0–5)
UROBILINOGEN UA: 0.2
WBC UA: 2 /HPF (ref 0–5)

## 2021-02-05 LAB — MAGNESIUM: MAGNESIUM: 1.5 mg/dL — ABNORMAL LOW (ref 1.6–2.6)

## 2021-02-05 LAB — PHOSPHORUS: PHOSPHORUS: 2.4 mg/dL (ref 2.4–5.1)

## 2021-02-05 MED ADMIN — belatacept (NULOJIX) 462.5 mg in sodium chloride (NS) 0.9 % 100 mL IVPB: 5 mg/kg | INTRAVENOUS | @ 19:00:00 | Stop: 2021-02-05

## 2021-02-05 NOTE — Unmapped (Signed)
[facility] for Belatacept infusion, S/P Kidney Transplant.  Patient has received previous doses of Belatacept  and tolerated them well. Patient denies any new concerns or questions, denies any changes to her medical history, medications and allergies reviewed by Clinical research associate. 24G PIV placed right AC, patient tolerated well, labs obtained per standing orders, no premeds ordered. Call light at patient's side, pt instructed on how to use.     1457 Belatacept 462.5 mg in 100 ml started per provider order     1525 Belatacept complete.      Patient tolerated infusion with no ill effects, PIV flushed with normal saline per infusion protocol, PIV D/C'd, gauze and coban applied. Vitals stable and comparable to baseline. Patient discharged from Infusion Center in stable condition with no acute distress. Next infusion appointment made for four weeks.

## 2021-02-06 LAB — CMV DNA, QUANTITATIVE, PCR
CMV QUANT: <50 [IU]/mL — ABNORMAL HIGH (ref <0)
CMV VIRAL LD: DETECTED — AB

## 2021-02-22 NOTE — Unmapped (Signed)
[facility] Refill Coordination Note    Specialty Medication(s) to be Shipped:   Transplant: Myfortic 180mg  and Prednisone 2.5mg     Other medication(s) to be shipped: calcitriol 0.34mcg     Kimberly [name], DOB: August 29, 1981  Phone: [phone] (home)       All above HIPAA information was verified with patient.     Was a Nurse, learning disability used for this call? No    Completed refill call assessment today to schedule patient's medication shipment from the [facility] [phone]).  All relevant notes have been reviewed.     Specialty medication(s) and dose(s) confirmed: Regimen is correct and unchanged.   Changes to medications: Melea reports no changes at this time.  Changes to insurance: No  New side effects reported not previously addressed with a pharmacist or physician: None reported  Questions for the pharmacist: No    Confirmed patient received a Conservation officer, historic buildings and a Surveyor, mining with first shipment. The patient will receive a drug information handout for each medication shipped and additional FDA Medication Guides as required.       DISEASE/MEDICATION-SPECIFIC INFORMATION        N/A    SPECIALTY MEDICATION ADHERENCE     Medication Adherence    Patient reported X missed doses in the last month: 0  Specialty Medication: Myfortic 180mg   Patient is on additional specialty medications: Yes  Additional Specialty Medications: Prednisone 2.5mg   Patient is on more than two specialty medications: No  Informant: patient              Were doses missed due to medication being on hold? No    Myfortic 180 mg: 10 days of medicine on hand   Prednisone 2.5 mg: 10 days of medicine on hand       REFERRAL TO PHARMACIST     Referral to the pharmacist: Not needed      [facility]     Shipping address confirmed in Epic.     Delivery Scheduled: Yes, Expected medication delivery date: 03/01/21.     Medication will be delivered via UPS to the prescription address in Epic WAM.    Jasper Loser   [facility] Specialty Technician

## 2021-02-23 DIAGNOSIS — Z940 Kidney transplant status: Principal | ICD-10-CM

## 2021-02-24 ENCOUNTER — Ambulatory Visit: Admit: 2021-02-24 | Discharge: 2021-02-25 | Payer: MEDICARE

## 2021-02-24 ENCOUNTER — Ambulatory Visit: Admit: 2021-02-24 | Discharge: 2021-02-25 | Payer: MEDICARE | Attending: Nephrology | Primary: Nephrology

## 2021-02-24 DIAGNOSIS — D849 Immunodeficiency, unspecified: Principal | ICD-10-CM

## 2021-02-24 DIAGNOSIS — Z940 Kidney transplant status: Principal | ICD-10-CM

## 2021-02-24 LAB — COMPREHENSIVE METABOLIC PANEL
ALBUMIN: 4.1 g/dL (ref 3.4–5.0)
ALKALINE PHOSPHATASE: 80 U/L (ref 46–116)
ALT (SGPT): 56 U/L — ABNORMAL HIGH (ref 10–49)
ANION GAP: 4 mmol/L — ABNORMAL LOW (ref 5–14)
AST (SGOT): 26 U/L (ref <=34)
BILIRUBIN TOTAL: 0.5 mg/dL (ref 0.3–1.2)
BLOOD UREA NITROGEN: 15 mg/dL (ref 9–23)
BUN / CREAT RATIO: 11
CALCIUM: 10.6 mg/dL — ABNORMAL HIGH (ref 8.7–10.4)
CHLORIDE: 102 mmol/L (ref 98–107)
CO2: 30.2 mmol/L (ref 20.0–31.0)
CREATININE: 1.31 mg/dL — ABNORMAL HIGH
EGFR CKD-EPI AA FEMALE: 59 mL/min/{1.73_m2} — ABNORMAL LOW (ref >=60)
EGFR CKD-EPI NON-AA FEMALE: 51 mL/min/{1.73_m2} — ABNORMAL LOW (ref >=60)
GLUCOSE RANDOM: 92 mg/dL (ref 70–179)
POTASSIUM: 3.3 mmol/L — ABNORMAL LOW (ref 3.4–4.8)
PROTEIN TOTAL: 7.1 g/dL (ref 5.7–8.2)
SODIUM: 136 mmol/L (ref 135–145)

## 2021-02-24 LAB — CBC W/ AUTO DIFF
BASOPHILS ABSOLUTE COUNT: 0.1 10*9/L (ref 0.0–0.1)
BASOPHILS RELATIVE PERCENT: 1.1 %
EOSINOPHILS ABSOLUTE COUNT: 0.1 10*9/L (ref 0.0–0.5)
EOSINOPHILS RELATIVE PERCENT: 1.8 %
HEMATOCRIT: 37.3 % (ref 34.0–44.0)
HEMOGLOBIN: 12.7 g/dL (ref 11.3–14.9)
LYMPHOCYTES ABSOLUTE COUNT: 1.4 10*9/L (ref 1.1–3.6)
LYMPHOCYTES RELATIVE PERCENT: 22.1 %
MEAN CORPUSCULAR HEMOGLOBIN CONC: 34.1 g/dL (ref 32.0–36.0)
MEAN CORPUSCULAR HEMOGLOBIN: 26.6 pg (ref 25.9–32.4)
MEAN CORPUSCULAR VOLUME: 77.9 fL (ref 77.6–95.7)
MEAN PLATELET VOLUME: 6.3 fL — ABNORMAL LOW (ref 6.8–10.7)
MONOCYTES ABSOLUTE COUNT: 0.6 10*9/L (ref 0.3–0.8)
MONOCYTES RELATIVE PERCENT: 10 %
NEUTROPHILS ABSOLUTE COUNT: 4.1 10*9/L (ref 1.8–7.8)
NEUTROPHILS RELATIVE PERCENT: 65 %
NUCLEATED RED BLOOD CELLS: 0 /100{WBCs} (ref <=4)
PLATELET COUNT: 349 10*9/L (ref 150–450)
RED BLOOD CELL COUNT: 4.79 10*12/L (ref 3.95–5.13)
RED CELL DISTRIBUTION WIDTH: 14 % (ref 12.2–15.2)
WBC ADJUSTED: 6.2 10*9/L (ref 3.6–11.2)

## 2021-02-24 LAB — PROTEIN / CREATININE RATIO, URINE
CREATININE, URINE: 38 mg/dL
PROTEIN URINE: 10 mg/dL
PROTEIN/CREAT RATIO, URINE: 0.263

## 2021-02-24 LAB — PHOSPHORUS: PHOSPHORUS: 2 mg/dL — ABNORMAL LOW (ref 2.4–5.1)

## 2021-02-24 LAB — LIPID PANEL
CHOLESTEROL/HDL RATIO SCREEN: 4.5 (ref 1.0–4.5)
CHOLESTEROL: 213 mg/dL — ABNORMAL HIGH (ref <=200)
HDL CHOLESTEROL: 47 mg/dL (ref 40–60)
LDL CHOLESTEROL CALCULATED: 117 mg/dL — ABNORMAL HIGH (ref 40–99)
NON-HDL CHOLESTEROL: 166 mg/dL — ABNORMAL HIGH (ref 70–130)
TRIGLYCERIDES: 247 mg/dL — ABNORMAL HIGH (ref 0–150)
VLDL CHOLESTEROL CAL: 49.4 mg/dL — ABNORMAL HIGH (ref 9–37)

## 2021-02-24 LAB — MAGNESIUM: MAGNESIUM: 1.8 mg/dL (ref 1.6–2.6)

## 2021-02-24 NOTE — Unmapped (Signed)
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  2nd kidney transplant - 2000-2002 - DD failed chronic rejection.  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 Her last dialysis was on 03/07/19.  Has a good U.O.    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    Creatinine - value 1.31, stable  Urine analysis: protein trace / WBC 2 / RBC 2 .  Urine protein/creatinine ratio: 0.263  DSA: B7 - 1399 10/30/20    Last CMV checked: 02/24/21 - not detected  Last BKV checked: Not detected 10/30/20    2) Immunosuppression: Myfortic 720 mg BID / Prednisone 5 mg every other day and 2.5 mg every other day / Belatacept monthly (last on 02/05/21)  - Changes in Immunosuppression - none   - On belatacept due to biopsy findings.  - Medications side effects: No     3) UTI  - History of recent recurrent UTI's. No UTI symptoms today.   - Previously referred to urogyn given risk of future antibiotic resistance    4) Hypotension - Controlled - off midodrine now.  Goal for B.P > 100 systolic.   Changes in B.P medications - none.    5) Hypertension - BP 135/91 in clinic today.   - Stable, no changes.    6) Anemia - stable - Hgb 12.7  Goal for Hemoglobin > 11.0  Ferritin 14.6 (09/04/19)   - Continue iron supplement once daily. Pt is not comfortable with increasing on iron supplement dose. She will work on incorporating iron-rich foods into her diet.   - Vit C supplements     7) MBD - Calcium - 10.6/ Phosphorus 2.0 - Secondary hyperparathyroidism  On calcitriol 0.25 mcg daily    8) Electrolytes: Mild hypokalemia present due to diuretics, will monitor.    9) Leg swelling   - Continue chlorthalidone 25 mg daily.     10) Immunizations:  Influenza (inactivated only): received 10/30/2020  Pneumococcal vaccination (inactivated only): will ask patient  Covid-19 [employer]): 02/05/20. 01/15/20. 08/2020. Pt will receive Evusheld injection next Friday (same day as belatacept infusion).     11) Cancer screening:  PAP smear - 11/06/19 negative   Mammogram - Age 45. Has referral.   Pt will schedule dermatology appointment for cancer screening.     12) Left UE swelling - patient following up in lymphedema clinic every 6 months.     13) High Cholesterol - Pt was previously on pravastatin 20 mg daily, but d/t all over muscle twitching discontinued it three weeks later.   On Zetia 10 mg daily. Tolerating well.    14) Anxiety   -Pt previously endorsed severe night sweats while on Celexa   -On Zoloft 50 mg daily - stable, no changes.     Follow up: 3 months or sooner if indicated.    History of  Presenting Illness:   Since patient's last visit in the transplant clinic - patient has been doing well in terms of transplant, taking transplant medications regularly, no episodes of rejection and no side effects of medications.    Patient continues to receive belatacept infusions (last on 02/05/21).     Today she presents feeling overall well. Continues to follow-up with lymphedema clinic for left UE swelling. States LE swelling has significantly improved since decreasing prednisone. States she is also anticipating significant life stressors the next couple of months but is otherwise doing well.      Diabetes: No   HTN: Yes: Hypotension    Controlled:Yes   Adherence      With Medication: yes    With Follow up: yes    Functional Status: independent    Patient was found to have reflux nephropathy at age 56 and was on peritoneal dialysis briefly before receiving a living related renal transplant from mother. She had some thrombotic complication and underwent nephrectomy in 1997. She started dialysis and had a second kidney transplant at age 33 in 40. 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 She received a 3rd kidney transplant in 11/2002 which failed in 2007 due to chronic rejection.    She has decreased vision both eyes.    Review of Systems:     Fever or chills: negative   Sore throat: negative   Fatigue/malaise: negative   Weight loss or gain: negative   New skin rash/lump or bump: negative   Problems with teeth/gums: negative   Chest pain: negative   Cough or shortness of breath: negative   Swelling: negative   Abdominal pain/heartburn/nausea/vomiting or diarrhea: negative   Pain or bleeding when urinating: negative   Twitching/numbness or weakness: negative     Physical Exam:     BP 135/91 (BP Site: R Arm, BP Position: Sitting, BP Cuff Size: Large)  - Pulse 85  - Temp 36 ??C (96.8 ??F) (Temporal)  - Wt 93.2 kg (205 lb 6.4 oz)  - LMP 02/14/2021  - BMI 35.24 kg/m??     Nursing note and vitals reviewed.   Constitutional: Oriented to person, place, and time. Appears well-developed and well-nourished. No distress.   HENT: Wearing mask   Head: Normocephalic and atraumatic.   Eyes: Right eye exhibits no discharge. Left eye exhibits no discharge. No scleral icterus.   Neck: Normal range of motion. Neck supple.   Cardiovascular: Normal rate and regular rhythm. Exam reveals no gallop and no friction rub. No murmur heard.   Pulmonary/Chest: Effort normal and breath sounds normal. No respiratory distress.   Abdominal: Soft. Non tender  Musculoskeletal: Normal range of motion. No edema and no tenderness.   Neurological: Alert and oriented to person, place, and time.   Skin: Skin is warm and dry. No rash noted. Not diaphoretic. No erythema. No pallor.   Psychiatric: Normal mood and affect. Behavior is normal. Judgment and thought content normal.     Renal Transplant History:    Race:  Caucasian   Age of recipient (at time of transplant): 40 y.o.   Cause of kidney disease: Reflux nephropathy   Native biopsy: No    Date of transplant: 12/05/18   Type of transplant: KDPI - 39%    - Donor creatinine: 1.3    - Any co morbidities: No    - Infection in donor: No    - HLA Mismatch: N/A    -  Ischemia time: 6h 66m    - Crossmatch: negative    - Donor kidney biopsy: No diagnostic abnormalities identified (12/05/18)     Induction: Campath - alemtuzumab   Maintenance IS at the time of transplant: tacrolimus, mycophenolate   DGF: Yes   Diabetes Onset after transplant: No    Allergies:   Allergies   Allergen Reactions   ??? Ancef [Cefazolin] Shortness Of Breath   ??? Adhesive Tape-Silicones      Other reaction(s): Other (See Comments)  Uncoded Allergy. Allergen: plastic tape, Other Reaction: blistering   ??? Demerol [Meperidine] Nausea And Vomiting        Current Medications:   Current Outpatient Medications   Medication Sig Dispense Refill   ??? acetaminophen (TYLENOL) 500 MG tablet Take 2 tablets (1,000 mg total) by mouth every six (6) hours as needed for pain. 100 tablet 0   ??? ascorbic acid, vitamin C, (ASCORBIC ACID) 500 MG tablet Take 500 mg by mouth daily.     ??? aspirin (ECOTRIN) 81 MG tablet Take 1 tablet (81 mg total) by mouth daily. 30 tablet 0   ??? calcitrioL (ROCALTROL) 0.25 MCG capsule Take 1 capsule (0.25 mcg total) by mouth daily. 30 capsule 11   ??? carboxymethylcellulose sodium (REFRESH CELLUVISC) 1 % DpGe Instill drops in affected eye(s) as directed as needed.     ??? chlorthalidone (HYGROTON) 25 MG tablet Take 1 tablet (25 mg total) by mouth daily. 30 tablet 11   ??? ezetimibe (ZETIA) 10 mg tablet Take 1 tablet (10 mg total) by mouth daily. 90 tablet 3   ??? ferrous sulfate 325 (65 FE) MG tablet Take 325 mg by mouth daily.     ??? MYFORTIC 180 mg EC tablet Take 4 tablets (720 mg total) by mouth Two (2) times a day. 240 tablet 11   ??? omeprazole (PRILOSEC) 40 MG capsule Take 1 capsule (40 mg total) by mouth daily. 30 capsule 0   ??? predniSONE (DELTASONE) 2.5 MG tablet Take 2 tablets (5 mg total) by mouth daily. 60 tablet 11   ??? sertraline (ZOLOFT) 50 MG tablet Take 1 tablet (50 mg total) by mouth daily. 90 tablet 3     No current facility-administered medications for this visit.       Past Medical History:   Past Medical History:   Diagnosis Date   ??? Bell's palsy    ??? Disease of thyroid gland    ??? ESRD (end stage renal disease) (CMS-HCC)    ??? Nonarteritic ischemic optic neuropathy    ??? Reflux nephropathy         Laboratory studies:     No results found for this or any previous visit (from the past 170 hour(s)).     Scribe's Attestation: Leeroy Bock, MD obtained and performed the history, physical exam and medical decision making elements that were entered into the chart.  Signed by Sharion Balloon, Scribe, on Feb 24, 2021 at 10:47 AM.    Documentation assistance provided by the Scribe. I was present during the time the encounter was recorded. The information recorded by the Scribe was done at my direction and has been reviewed and validated by me.

## 2021-02-25 LAB — CMV DNA, QUANTITATIVE, PCR: CMV VIRAL LD: NOT DETECTED

## 2021-02-25 LAB — VITAMIN D 25 HYDROXY: VITAMIN D, TOTAL (25OH): 35.5 ng/mL (ref 20.0–80.0)

## 2021-02-26 DIAGNOSIS — Z940 Kidney transplant status: Principal | ICD-10-CM

## 2021-02-26 DIAGNOSIS — Z79899 Other long term (current) drug therapy: Principal | ICD-10-CM

## 2021-02-26 MED FILL — PREDNISONE 2.5 MG TABLET: ORAL | 30 days supply | Qty: 60 | Fill #5

## 2021-02-26 MED FILL — CALCITRIOL 0.25 MCG CAPSULE: ORAL | 30 days supply | Qty: 30 | Fill #2

## 2021-02-26 MED FILL — MYFORTIC 180 MG TABLET,DELAYED RELEASE: ORAL | 30 days supply | Qty: 240 | Fill #9

## 2021-03-01 LAB — HLA DS POST TRANSPLANT
ANTI-DONOR DRW #1 MFI: 378 MFI
ANTI-DONOR DRW #2 MFI: 36 MFI
ANTI-DONOR HLA-A #1 MFI: 17 MFI
ANTI-DONOR HLA-A #2 MFI: 218 MFI
ANTI-DONOR HLA-B #1 MFI: 1647 MFI — ABNORMAL HIGH
ANTI-DONOR HLA-B #2 MFI: 82 MFI
ANTI-DONOR HLA-C #1 MFI: 107 MFI
ANTI-DONOR HLA-C #2 MFI: 20 MFI
ANTI-DONOR HLA-DP #2 MFI: 0 MFI
ANTI-DONOR HLA-DQB #1 MFI: 4 MFI
ANTI-DONOR HLA-DQB #2 MFI: 69 MFI
ANTI-DONOR HLA-DR #1 MFI: 5 MFI
ANTI-DONOR HLA-DR #2 MFI: 62 MFI

## 2021-03-01 LAB — FSAB CLASS 2 ANTIBODY SPECIFICITY: HLA CL2 AB RESULT: POSITIVE

## 2021-03-01 LAB — FSAB CLASS 1 ANTIBODY SPECIFICITY: HLA CLASS 1 ANTIBODY RESULT: POSITIVE

## 2021-03-01 LAB — BK VIRUS QUANTITATIVE PCR, BLOOD: BK BLOOD RESULT: NOT DETECTED

## 2021-03-05 ENCOUNTER — Ambulatory Visit: Admit: 2021-03-05 | Discharge: 2021-03-05 | Payer: MEDICARE

## 2021-03-05 ENCOUNTER — Ambulatory Visit: Admit: 2021-03-05 | Discharge: 2021-03-05 | Payer: MEDICARE | Attending: Nephrology | Primary: Nephrology

## 2021-03-05 DIAGNOSIS — Z940 Kidney transplant status: Principal | ICD-10-CM

## 2021-03-05 DIAGNOSIS — D849 Immunodeficiency, unspecified: Principal | ICD-10-CM

## 2021-03-05 LAB — COMPREHENSIVE METABOLIC PANEL
ALBUMIN: 4.1 g/dL (ref 3.4–5.0)
ALKALINE PHOSPHATASE: 76 U/L (ref 46–116)
ALT (SGPT): 53 U/L — ABNORMAL HIGH (ref 10–49)
ANION GAP: 8 mmol/L (ref 5–14)
AST (SGOT): 32 U/L (ref <=34)
BILIRUBIN TOTAL: 0.5 mg/dL (ref 0.3–1.2)
BLOOD UREA NITROGEN: 18 mg/dL (ref 9–23)
BUN / CREAT RATIO: 13
CALCIUM: 10.4 mg/dL (ref 8.7–10.4)
CHLORIDE: 101 mmol/L (ref 98–107)
CO2: 27.4 mmol/L (ref 20.0–31.0)
CREATININE: 1.34 mg/dL — ABNORMAL HIGH
EGFR CKD-EPI AA FEMALE: 57 mL/min/{1.73_m2} — ABNORMAL LOW (ref >=60)
EGFR CKD-EPI NON-AA FEMALE: 50 mL/min/{1.73_m2} — ABNORMAL LOW (ref >=60)
GLUCOSE RANDOM: 106 mg/dL (ref 70–179)
POTASSIUM: 3.4 mmol/L — ABNORMAL LOW (ref 3.5–5.1)
PROTEIN TOTAL: 7 g/dL (ref 5.7–8.2)
SODIUM: 136 mmol/L (ref 135–145)

## 2021-03-05 LAB — CBC W/ AUTO DIFF
BASOPHILS ABSOLUTE COUNT: 0.1 10*9/L (ref 0.0–0.1)
BASOPHILS RELATIVE PERCENT: 1.1 %
EOSINOPHILS ABSOLUTE COUNT: 0.1 10*9/L (ref 0.0–0.5)
EOSINOPHILS RELATIVE PERCENT: 1.1 %
HEMATOCRIT: 36.9 % (ref 34.0–44.0)
HEMOGLOBIN: 12.6 g/dL (ref 11.3–14.9)
LYMPHOCYTES ABSOLUTE COUNT: 1.2 10*9/L (ref 1.1–3.6)
LYMPHOCYTES RELATIVE PERCENT: 15.9 %
MEAN CORPUSCULAR HEMOGLOBIN CONC: 34.2 g/dL (ref 32.0–36.0)
MEAN CORPUSCULAR HEMOGLOBIN: 26.9 pg (ref 25.9–32.4)
MEAN CORPUSCULAR VOLUME: 78.8 fL (ref 77.6–95.7)
MEAN PLATELET VOLUME: 6.1 fL — ABNORMAL LOW (ref 6.8–10.7)
MONOCYTES ABSOLUTE COUNT: 0.5 10*9/L (ref 0.3–0.8)
MONOCYTES RELATIVE PERCENT: 6.7 %
NEUTROPHILS ABSOLUTE COUNT: 5.9 10*9/L (ref 1.8–7.8)
NEUTROPHILS RELATIVE PERCENT: 75.2 %
PLATELET COUNT: 303 10*9/L (ref 150–450)
RED BLOOD CELL COUNT: 4.68 10*12/L (ref 3.95–5.13)
RED CELL DISTRIBUTION WIDTH: 14.2 % (ref 12.2–15.2)
WBC ADJUSTED: 7.8 10*9/L (ref 3.6–11.2)

## 2021-03-05 LAB — LIPID PANEL
CHOLESTEROL/HDL RATIO SCREEN: 5.2 — ABNORMAL HIGH (ref 1.0–4.5)
CHOLESTEROL: 218 mg/dL — ABNORMAL HIGH (ref <=200)
HDL CHOLESTEROL: 42 mg/dL (ref 40–60)
NON-HDL CHOLESTEROL: 176 mg/dL — ABNORMAL HIGH (ref 70–130)
TRIGLYCERIDES: 409 mg/dL — ABNORMAL HIGH (ref 0–150)

## 2021-03-05 LAB — MAGNESIUM: MAGNESIUM: 1.7 mg/dL (ref 1.6–2.6)

## 2021-03-05 LAB — PHOSPHORUS: PHOSPHORUS: 2.6 mg/dL (ref 2.4–5.1)

## 2021-03-05 MED ADMIN — belatacept (NULOJIX) 462.5 mg in sodium chloride (NS) 0.9 % 100 mL IVPB: 5 mg/kg | INTRAVENOUS | @ 20:00:00 | Stop: 2021-03-05

## 2021-03-05 MED ADMIN — tixagevimab-cilgavimab 300 mg/3 mL- 300 mg/3 mL injection 6 mL: 6 mL | INTRAMUSCULAR | @ 18:00:00 | Stop: 2021-03-05

## 2021-03-05 NOTE — Unmapped (Signed)
Pt to [facility] for infusion of Belatacept. Pt denies new complaints/ infections. VSS  labs drawn .   1533 Belatacept 462.5 mg  1601 Belatacept complete   VSS- PIV removed per protocol. Discharged to home without issue.

## 2021-03-05 NOTE — Unmapped (Signed)
Pt here for Evusheld injections. Education and handout provided to pt and questions answered. Signs and symptoms gone over with patient and they verbalized understanding. First injection given to right side gluteal muscle and second given to left side. Pt tolerated without difficulty. Pt in lobby for one hour observation.     BP: 124/83  P: 87  O2: 100

## 2021-03-06 LAB — BK VIRUS QUANTITATIVE PCR, BLOOD: BK BLOOD RESULT: NOT DETECTED

## 2021-03-08 LAB — CMV DNA, QUANTITATIVE, PCR: CMV VIRAL LD: NOT DETECTED

## 2021-03-09 DIAGNOSIS — Z940 Kidney transplant status: Principal | ICD-10-CM

## 2021-03-09 LAB — VITAMIN D 25 HYDROXY: VITAMIN D, TOTAL (25OH): 26.1 ng/mL (ref 20.0–80.0)

## 2021-03-10 ENCOUNTER — Ambulatory Visit: Admit: 2021-03-10 | Discharge: 2021-03-11 | Payer: MEDICARE

## 2021-03-10 NOTE — Unmapped (Signed)
Patient reminded to repeat cytology test per Dr Nestor Lewandowsky

## 2021-03-16 LAB — HLA DS POST TRANSPLANT

## 2021-03-23 NOTE — Unmapped (Signed)
[facility] Clinical Assessment & Refill Coordination Note    Kimberly [name], DOB: 1981-02-24  Phone: [phone] (home)     All above HIPAA information was verified with patient.     Was a Nurse, learning disability used for this call? No    Specialty Medication(s):   Transplant: Myfortic 180mg  and Prednisone 2.5mg      Current Outpatient Medications   Medication Sig Dispense Refill   ??? acetaminophen (TYLENOL) 500 MG tablet Take 2 tablets (1,000 mg total) by mouth every six (6) hours as needed for pain. 100 tablet 0   ??? ascorbic acid, vitamin C, (ASCORBIC ACID) 500 MG tablet Take 500 mg by mouth daily.     ??? aspirin (ECOTRIN) 81 MG tablet Take 1 tablet (81 mg total) by mouth daily. 30 tablet 0   ??? calcitrioL (ROCALTROL) 0.25 MCG capsule Take 1 capsule (0.25 mcg total) by mouth daily. 30 capsule 11   ??? carboxymethylcellulose sodium (REFRESH CELLUVISC) 1 % DpGe Instill drops in affected eye(s) as directed as needed.     ??? chlorthalidone (HYGROTON) 25 MG tablet Take 1 tablet (25 mg total) by mouth daily. 30 tablet 11   ??? ezetimibe (ZETIA) 10 mg tablet Take 1 tablet (10 mg total) by mouth daily. 90 tablet 3   ??? ferrous sulfate 325 (65 FE) MG tablet Take 325 mg by mouth daily.     ??? MYFORTIC 180 mg EC tablet Take 4 tablets (720 mg total) by mouth Two (2) times a day. 240 tablet 11   ??? omeprazole (PRILOSEC) 40 MG capsule Take 1 capsule (40 mg total) by mouth daily. 30 capsule 0   ??? predniSONE (DELTASONE) 2.5 MG tablet Take 2 tablets (5 mg total) by mouth daily. 60 tablet 11   ??? sertraline (ZOLOFT) 50 MG tablet Take 1 tablet (50 mg total) by mouth daily. 90 tablet 3     No current facility-administered medications for this visit.        Changes to medications: Lockie reports no changes at this time.    Allergies   Allergen Reactions   ??? Ancef [Cefazolin] Shortness Of Breath   ??? Adhesive Tape-Silicones      Other reaction(s): Other (See Comments)  Uncoded Allergy. Allergen: plastic tape, Other Reaction: blistering   ??? Demerol [Meperidine] Nausea And Vomiting       Changes to allergies: No    SPECIALTY MEDICATION ADHERENCE     Myfortic 180mg   : 12 days of medicine on hand   Prednisone 2.5mg   : 12 days of medicine on hand       Medication Adherence    Patient reported X missed doses in the last month: 0  Specialty Medication: myfortic 180mg   Patient is on additional specialty medications: Yes  Additional Specialty Medications: Prednisone 2.5mg   Patient Reported Additional Medication X Missed Doses in the Last Month: 0          Specialty medication(s) dose(s) confirmed: Regimen is correct and unchanged.     Are there any concerns with adherence? No    Adherence counseling provided? Not needed    CLINICAL MANAGEMENT AND INTERVENTION      Clinical Benefit Assessment:    Do you feel the medicine is effective or helping your condition? Yes    Clinical Benefit counseling provided? Not needed    Adverse Effects Assessment:    Are you experiencing any side effects? No    Are you experiencing difficulty administering your medicine? No  Quality of Life Assessment:    How many days over the past month did your transplant  keep you from your normal activities? For example, brushing your teeth or getting up in the morning. 0    Have you discussed this with your provider? Not needed    Acute Infection Status:    Acute infections noted within Epic:  No active infections  Patient reported infection: None    Therapy Appropriateness:    Is therapy appropriate? Yes, therapy is appropriate and should be continued    DISEASE/MEDICATION-SPECIFIC INFORMATION      N/A    PATIENT SPECIFIC NEEDS     - Does the patient have any physical, cognitive, or cultural barriers? No    - Is the patient high risk? Yes, patient is taking a REMS drug. Medication is dispensed in compliance with REMS program    - Does the patient require a Care Management Plan? No     - Does the patient require physician intervention or other additional services (i.e. nutrition, smoking cessation, social work)? No      SHIPPING     Specialty Medication(s) to be Shipped:   Transplant: Myfortic 180mg  and Prednisone 2.5mg     Other medication(s) to be shipped: calcitriol     Changes to insurance: No    Delivery Scheduled: Yes, Expected medication delivery date: 03/30/2021.     Medication will be delivered via UPS to the confirmed prescription address in [facility].    The patient will receive a drug information handout for each medication shipped and additional FDA Medication Guides as required.  Verified that patient has previously received a Conservation officer, historic buildings and a Surveyor, mining.    The patient or caregiver noted above participated in the development of this care plan and knows that they can request review of or adjustments to the care plan at any time.      All of the patient's questions and concerns have been addressed.    Thad Ranger   [facility] Specialty Pharmacist

## 2021-03-29 MED FILL — PREDNISONE 2.5 MG TABLET: ORAL | 30 days supply | Qty: 60 | Fill #6

## 2021-03-29 MED FILL — CALCITRIOL 0.25 MCG CAPSULE: ORAL | 30 days supply | Qty: 30 | Fill #3

## 2021-03-29 MED FILL — MYFORTIC 180 MG TABLET,DELAYED RELEASE: ORAL | 30 days supply | Qty: 240 | Fill #10

## 2021-04-02 ENCOUNTER — Ambulatory Visit: Admit: 2021-04-02 | Discharge: 2021-04-03 | Payer: MEDICARE

## 2021-04-02 LAB — CBC W/ AUTO DIFF
BASOPHILS ABSOLUTE COUNT: 0.1 10*9/L (ref 0.0–0.1)
BASOPHILS RELATIVE PERCENT: 0.8 %
EOSINOPHILS ABSOLUTE COUNT: 0.1 10*9/L (ref 0.0–0.5)
EOSINOPHILS RELATIVE PERCENT: 0.9 %
HEMATOCRIT: 37 % (ref 34.0–44.0)
HEMOGLOBIN: 12.7 g/dL (ref 11.3–14.9)
LYMPHOCYTES ABSOLUTE COUNT: 1.4 10*9/L (ref 1.1–3.6)
LYMPHOCYTES RELATIVE PERCENT: 16.6 %
MEAN CORPUSCULAR HEMOGLOBIN CONC: 34.4 g/dL (ref 32.0–36.0)
MEAN CORPUSCULAR HEMOGLOBIN: 27.1 pg (ref 25.9–32.4)
MEAN CORPUSCULAR VOLUME: 78.7 fL (ref 77.6–95.7)
MEAN PLATELET VOLUME: 6.4 fL — ABNORMAL LOW (ref 6.8–10.7)
MONOCYTES ABSOLUTE COUNT: 0.6 10*9/L (ref 0.3–0.8)
MONOCYTES RELATIVE PERCENT: 7 %
NEUTROPHILS ABSOLUTE COUNT: 6.3 10*9/L (ref 1.8–7.8)
NEUTROPHILS RELATIVE PERCENT: 74.7 %
PLATELET COUNT: 346 10*9/L (ref 150–450)
RED BLOOD CELL COUNT: 4.7 10*12/L (ref 3.95–5.13)
RED CELL DISTRIBUTION WIDTH: 15.1 % (ref 12.2–15.2)
WBC ADJUSTED: 8.5 10*9/L (ref 3.6–11.2)

## 2021-04-02 LAB — URINALYSIS
BACTERIA: NONE SEEN /HPF
BILIRUBIN UA: NEGATIVE
BLOOD UA: NEGATIVE
GLUCOSE UA: NEGATIVE
KETONES UA: NEGATIVE
LEUKOCYTE ESTERASE UA: NEGATIVE
NITRITE UA: NEGATIVE
PH UA: 7 (ref 5.0–9.0)
RBC UA: 4 /HPF — ABNORMAL HIGH (ref <4)
SPECIFIC GRAVITY UA: <=1.005 (ref 1.005–1.030)
SQUAMOUS EPITHELIAL: 2 /HPF (ref 0–5)
UROBILINOGEN UA: 0.2
WBC UA: 1 /HPF (ref 0–5)

## 2021-04-02 LAB — COMPREHENSIVE METABOLIC PANEL
ALBUMIN: 4.2 g/dL (ref 3.4–5.0)
ALKALINE PHOSPHATASE: 79 U/L (ref 46–116)
ALT (SGPT): 44 U/L (ref 10–49)
ANION GAP: 8 mmol/L (ref 5–14)
AST (SGOT): 24 U/L (ref <=34)
BILIRUBIN TOTAL: 0.6 mg/dL (ref 0.3–1.2)
BLOOD UREA NITROGEN: 15 mg/dL (ref 9–23)
BUN / CREAT RATIO: 12
CALCIUM: 10 mg/dL (ref 8.7–10.4)
CHLORIDE: 101 mmol/L (ref 98–107)
CO2: 28 mmol/L (ref 20.0–31.0)
CREATININE: 1.26 mg/dL — ABNORMAL HIGH
EGFR CKD-EPI (2021) FEMALE: 55 mL/min/{1.73_m2} — ABNORMAL LOW (ref >=60)
GLUCOSE RANDOM: 81 mg/dL (ref 70–179)
POTASSIUM: 3.8 mmol/L (ref 3.4–4.8)
PROTEIN TOTAL: 7.1 g/dL (ref 5.7–8.2)
SODIUM: 137 mmol/L (ref 135–145)

## 2021-04-02 LAB — MAGNESIUM: MAGNESIUM: 1.7 mg/dL (ref 1.6–2.6)

## 2021-04-02 LAB — PHOSPHORUS: PHOSPHORUS: 1.6 mg/dL — ABNORMAL LOW (ref 2.4–5.1)

## 2021-04-02 MED ADMIN — belatacept (NULOJIX) 475 mg in sodium chloride (NS) 0.9 % 100 mL IVPB: 5 mg/kg | INTRAVENOUS | @ 19:00:00 | Stop: 2021-04-02

## 2021-04-02 NOTE — Unmapped (Signed)
[facility] Bel[facility]pt [facility]. Patient denies any new concerns or questions, denies any changes to her medical history, medications and allergies reviewed by Clinical research associate. 24G PIV placed RAC, patient tolerated well, labs obtained per standing orders, no premeds ordered. Call light at patient's side, pt instructed on how to use.     1519 Belatacept 475 mg in 100 ml started per provider order     1548 Belatacept complete.      Patient tolerated infusion with no ill effects, PIV flushed with normal saline per infusion protocol, PIV D/C'd, gauze and coban applied. Vitals stable and comparable to baseline. Patient discharged from Infusion Center in stable condition with no acute distress. Next infusion appointment made for four weeks.

## 2021-04-22 NOTE — Unmapped (Signed)
[facility] Refill Coordination Note    Specialty Medication(s) to be Shipped:   Transplant: Myfortic 180mg  and Prednisone 2.5mg     Other medication(s) to be shipped: calcitriol     GOLDA ZAVALZA, DOB: [birth year]  Phone: [phone] (home)       All above HIPAA information was verified with patient.     Was a Nurse, learning disability used for this call? No    Completed refill call assessment today to schedule patient's medication shipment from the [facility] [phone]).  All relevant notes have been reviewed.     Specialty medication(s) and dose(s) confirmed: Regimen is correct and unchanged.   Changes to medications: Elliana reports no changes at this time.  Changes to insurance: No  New side effects reported not previously addressed with a pharmacist or physician: None reported  Questions for the pharmacist: No    Confirmed patient received a Conservation officer, historic buildings and a Surveyor, mining with first shipment. The patient will receive a drug information handout for each medication shipped and additional FDA Medication Guides as required.       DISEASE/MEDICATION-SPECIFIC INFORMATION        N/A    SPECIALTY MEDICATION ADHERENCE     Medication Adherence    Patient reported X missed doses in the last month: 0  Specialty Medication: Myfortic 180mg   Patient is on additional specialty medications: Yes  Additional Specialty Medications: Prednisone 2.5mg    Patient Reported Additional Medication X Missed Doses in the Last Month: 0  Patient is on more than two specialty medications: No        Were doses missed due to medication being on hold? No    Myfortic 180 mg: 7 days of medicine on hand   Prednisone 2.5 mg: 7 days of medicine on hand     REFERRAL TO PHARMACIST     Referral to the pharmacist: Not needed      [facility]     Shipping address confirmed in Epic.     Delivery Scheduled: Yes, Expected medication delivery date: 04/28/2021.     Medication will be delivered via UPS to the prescription address in Epic WAM.    [name] [facility] Specialty Technician

## 2021-04-27 MED FILL — CALCITRIOL 0.25 MCG CAPSULE: ORAL | 30 days supply | Qty: 30 | Fill #4

## 2021-04-27 MED FILL — PREDNISONE 2.5 MG TABLET: ORAL | 30 days supply | Qty: 60 | Fill #7

## 2021-04-27 MED FILL — MYFORTIC 180 MG TABLET,DELAYED RELEASE: ORAL | 30 days supply | Qty: 240 | Fill #11

## 2021-04-30 ENCOUNTER — Ambulatory Visit: Admit: 2021-04-30 | Discharge: 2021-05-01 | Payer: MEDICARE

## 2021-04-30 LAB — CBC W/ AUTO DIFF
BASOPHILS ABSOLUTE COUNT: 0.1 10*9/L (ref 0.0–0.1)
BASOPHILS RELATIVE PERCENT: 1.3 %
EOSINOPHILS ABSOLUTE COUNT: 0.1 10*9/L (ref 0.0–0.5)
EOSINOPHILS RELATIVE PERCENT: 1.3 %
HEMATOCRIT: 38 % (ref 34.0–44.0)
HEMOGLOBIN: 12.9 g/dL (ref 11.3–14.9)
LYMPHOCYTES ABSOLUTE COUNT: 1.4 10*9/L (ref 1.1–3.6)
LYMPHOCYTES RELATIVE PERCENT: 22 %
MEAN CORPUSCULAR HEMOGLOBIN CONC: 34 g/dL (ref 32.0–36.0)
MEAN CORPUSCULAR HEMOGLOBIN: 26.8 pg (ref 25.9–32.4)
MEAN CORPUSCULAR VOLUME: 79 fL (ref 77.6–95.7)
MEAN PLATELET VOLUME: 6.5 fL — ABNORMAL LOW (ref 6.8–10.7)
MONOCYTES ABSOLUTE COUNT: 0.5 10*9/L (ref 0.3–0.8)
MONOCYTES RELATIVE PERCENT: 8.4 %
NEUTROPHILS ABSOLUTE COUNT: 4.4 10*9/L (ref 1.8–7.8)
NEUTROPHILS RELATIVE PERCENT: 67 %
NUCLEATED RED BLOOD CELLS: 0 /100{WBCs} (ref <=4)
PLATELET COUNT: 362 10*9/L (ref 150–450)
RED BLOOD CELL COUNT: 4.81 10*12/L (ref 3.95–5.13)
RED CELL DISTRIBUTION WIDTH: 14.8 % (ref 12.2–15.2)
WBC ADJUSTED: 6.5 10*9/L (ref 3.6–11.2)

## 2021-04-30 LAB — MAGNESIUM: MAGNESIUM: 1.8 mg/dL (ref 1.6–2.6)

## 2021-04-30 LAB — PHOSPHORUS: PHOSPHORUS: 2.3 mg/dL — ABNORMAL LOW (ref 2.4–5.1)

## 2021-04-30 LAB — COMPREHENSIVE METABOLIC PANEL
ALBUMIN: 4.4 g/dL (ref 3.4–5.0)
ALKALINE PHOSPHATASE: 68 U/L (ref 46–116)
ALT (SGPT): 49 U/L (ref 10–49)
ANION GAP: 7 mmol/L (ref 5–14)
AST (SGOT): 29 U/L (ref <=34)
BILIRUBIN TOTAL: 0.6 mg/dL (ref 0.3–1.2)
BLOOD UREA NITROGEN: 17 mg/dL (ref 9–23)
BUN / CREAT RATIO: 13
CALCIUM: 10.2 mg/dL (ref 8.7–10.4)
CHLORIDE: 98 mmol/L (ref 98–107)
CO2: 29.1 mmol/L (ref 20.0–31.0)
CREATININE: 1.36 mg/dL — ABNORMAL HIGH
EGFR CKD-EPI (2021) FEMALE: 51 mL/min/{1.73_m2} — ABNORMAL LOW (ref >=60)
GLUCOSE RANDOM: 112 mg/dL (ref 70–179)
POTASSIUM: 3.4 mmol/L (ref 3.4–4.8)
PROTEIN TOTAL: 7.3 g/dL (ref 5.7–8.2)
SODIUM: 134 mmol/L — ABNORMAL LOW (ref 135–145)

## 2021-04-30 MED ADMIN — belatacept (NULOJIX) 462.5 mg in sodium chloride (NS) 0.9 % 100 mL IVPB: 5 mg/kg | INTRAVENOUS | @ 19:00:00 | Stop: 2021-04-30

## 2021-04-30 NOTE — Unmapped (Signed)
[facility] Bel[facility]pt infusion, S/P Kidney Transplant.  Patient has tolerated previous doses of Belatacept. Patient denies any new concerns or questions, denies any changes to her medical history, medications and allergies reviewed by Clinical research associate. 24G PIV placed in RAC, patient tolerated well, labs obtained per standing orders, no premeds ordered. Call light at patient's side, pt instructed on how to use.     1527 Belatacept 462.5 mg in 100 ml started per provider order     1559 Belatacept complete.      Patient tolerated infusion with no ill effects, PIV flushed with normal saline per infusion protocol, PIV D/C'd, gauze and coban applied. Vitals stable and comparable to baseline. Patient discharged from Infusion Center in stable condition with no acute distress. Next infusion appointment made for four weeks.

## 2021-05-18 DIAGNOSIS — Z940 Kidney transplant status: Principal | ICD-10-CM

## 2021-05-18 MED ORDER — MYFORTIC 180 MG TABLET,DELAYED RELEASE
ORAL_TABLET | Freq: Two times a day (BID) | ORAL | 11 refills | 30 days | Status: CP
Start: 2021-05-18 — End: 2022-05-18
  Filled 2021-05-24: qty 240, 30d supply, fill #0

## 2021-05-18 NOTE — Unmapped (Signed)
Pt request for RX Refill    MYFORTIC 180 mg EC tablet

## 2021-05-18 NOTE — Unmapped (Signed)
[facility] Refill Coordination Note    Specialty Medication(s) to be Shipped:   Transplant: Myfortic 180mg  and Prednisone 2.5mg     Other medication(s) to be shipped: calcitriol     Kimberly [name], DOB: [birth year]  Phone: [phone] (home)       All above HIPAA information was verified with patient.     Was a Nurse, learning disability used for this call? No    Completed refill call assessment today to schedule patient's medication shipment from the [facility] [phone]).  All relevant notes have been reviewed.     Specialty medication(s) and dose(s) confirmed: Regimen is correct and unchanged.   Changes to medications: Kimberly [name] reports no changes at this time.  Changes to insurance: No  New side effects reported not previously addressed with a pharmacist or physician: None reported  Questions for the pharmacist: No    Confirmed patient received a Conservation officer, historic buildings and a Surveyor, mining with first shipment. The patient will receive a drug information handout for each medication shipped and additional FDA Medication Guides as required.       DISEASE/MEDICATION-SPECIFIC INFORMATION        N/A    SPECIALTY MEDICATION ADHERENCE     Medication Adherence    Patient reported X missed doses in the last month: 0  Specialty Medication: predniSONE (DELTASONE) 2.5 MG tablet  Patient is on additional specialty medications: Yes  Additional Specialty Medications: MYFORTIC 180 mg EC tablet  Patient Reported Additional Medication X Missed Doses in the Last Month: 0              Were doses missed due to medication being on hold? No   Myfortic 180mg   10 days worth of medication on hand.  Prednisone 2.5mg   10 days worth of medication on hand.        REFERRAL TO PHARMACIST     Referral to the pharmacist: Not needed      [facility]     Shipping address confirmed in Epic.     Delivery Scheduled: Yes, Expected medication delivery date: 05/25/21.     Medication will be delivered via UPS to the prescription address in Epic WAM.    Kimberly [name]   [facility] Specialty Technician

## 2021-05-24 MED FILL — CALCITRIOL 0.25 MCG CAPSULE: ORAL | 30 days supply | Qty: 30 | Fill #5

## 2021-05-24 MED FILL — PREDNISONE 2.5 MG TABLET: ORAL | 30 days supply | Qty: 60 | Fill #8

## 2021-05-26 ENCOUNTER — Ambulatory Visit: Admit: 2021-05-26 | Discharge: 2021-05-27 | Payer: MEDICARE

## 2021-05-26 ENCOUNTER — Ambulatory Visit: Admit: 2021-05-26 | Discharge: 2021-05-27 | Payer: MEDICARE | Attending: Nephrology | Primary: Nephrology

## 2021-05-26 DIAGNOSIS — Z940 Kidney transplant status: Principal | ICD-10-CM

## 2021-05-26 DIAGNOSIS — Z79899 Other long term (current) drug therapy: Principal | ICD-10-CM

## 2021-05-26 LAB — CBC W/ AUTO DIFF
BASOPHILS ABSOLUTE COUNT: 0.1 10*9/L (ref 0.0–0.1)
BASOPHILS RELATIVE PERCENT: 1 %
EOSINOPHILS ABSOLUTE COUNT: 0.1 10*9/L (ref 0.0–0.5)
EOSINOPHILS RELATIVE PERCENT: 1.7 %
HEMATOCRIT: 37.1 % (ref 34.0–44.0)
HEMOGLOBIN: 12.6 g/dL (ref 11.3–14.9)
LYMPHOCYTES ABSOLUTE COUNT: 1.3 10*9/L (ref 1.1–3.6)
LYMPHOCYTES RELATIVE PERCENT: 21.2 %
MEAN CORPUSCULAR HEMOGLOBIN CONC: 33.9 g/dL (ref 32.0–36.0)
MEAN CORPUSCULAR HEMOGLOBIN: 27.1 pg (ref 25.9–32.4)
MEAN CORPUSCULAR VOLUME: 80.1 fL (ref 77.6–95.7)
MEAN PLATELET VOLUME: 6.3 fL — ABNORMAL LOW (ref 6.8–10.7)
MONOCYTES ABSOLUTE COUNT: 0.6 10*9/L (ref 0.3–0.8)
MONOCYTES RELATIVE PERCENT: 9.2 %
NEUTROPHILS ABSOLUTE COUNT: 4.1 10*9/L (ref 1.8–7.8)
NEUTROPHILS RELATIVE PERCENT: 66.9 %
PLATELET COUNT: 296 10*9/L (ref 150–450)
RED BLOOD CELL COUNT: 4.64 10*12/L (ref 3.95–5.13)
RED CELL DISTRIBUTION WIDTH: 14.4 % (ref 12.2–15.2)
WBC ADJUSTED: 6.1 10*9/L (ref 3.6–11.2)

## 2021-05-26 LAB — PROTEIN / CREATININE RATIO, URINE
CREATININE, URINE: 33.6 mg/dL
PROTEIN URINE: <6 mg/dL

## 2021-05-26 LAB — URINALYSIS
BILIRUBIN UA: NEGATIVE
GLUCOSE UA: NEGATIVE
KETONES UA: NEGATIVE
LEUKOCYTE ESTERASE UA: NEGATIVE
NITRITE UA: NEGATIVE
PH UA: 6 (ref 5.0–9.0)
PROTEIN UA: NEGATIVE
RBC UA: 2 /HPF (ref <4)
SPECIFIC GRAVITY UA: 1.01 (ref 1.005–1.030)
SQUAMOUS EPITHELIAL: <1 /HPF (ref 0–5)
UROBILINOGEN UA: 0.2
WBC UA: <1 /HPF (ref 0–5)

## 2021-05-26 LAB — PHOSPHORUS: PHOSPHORUS: 2.4 mg/dL (ref 2.4–5.1)

## 2021-05-26 LAB — COMPREHENSIVE METABOLIC PANEL
ALBUMIN: 4.4 g/dL (ref 3.4–5.0)
ALKALINE PHOSPHATASE: 70 U/L (ref 46–116)
ALT (SGPT): 58 U/L — ABNORMAL HIGH (ref 10–49)
ANION GAP: 7 mmol/L (ref 5–14)
AST (SGOT): 24 U/L (ref <=34)
BILIRUBIN TOTAL: 0.7 mg/dL (ref 0.3–1.2)
BLOOD UREA NITROGEN: 19 mg/dL (ref 9–23)
BUN / CREAT RATIO: 14
CALCIUM: 10.6 mg/dL — ABNORMAL HIGH (ref 8.7–10.4)
CHLORIDE: 100 mmol/L (ref 98–107)
CO2: 28 mmol/L (ref 20.0–31.0)
CREATININE: 1.37 mg/dL — ABNORMAL HIGH
EGFR CKD-EPI (2021) FEMALE: 50 mL/min/{1.73_m2} — ABNORMAL LOW (ref >=60)
GLUCOSE RANDOM: 92 mg/dL (ref 70–99)
POTASSIUM: 3.3 mmol/L — ABNORMAL LOW (ref 3.4–4.8)
PROTEIN TOTAL: 7.2 g/dL (ref 5.7–8.2)
SODIUM: 135 mmol/L (ref 135–145)

## 2021-05-26 LAB — LIPID PANEL
CHOLESTEROL/HDL RATIO SCREEN: 5.7 — ABNORMAL HIGH (ref 1.0–4.5)
CHOLESTEROL: 211 mg/dL — ABNORMAL HIGH (ref <=200)
HDL CHOLESTEROL: 37 mg/dL — ABNORMAL LOW (ref 40–60)
LDL CHOLESTEROL CALCULATED: 118 mg/dL — ABNORMAL HIGH (ref 40–99)
NON-HDL CHOLESTEROL: 174 mg/dL — ABNORMAL HIGH (ref 70–130)
TRIGLYCERIDES: 281 mg/dL — ABNORMAL HIGH (ref 0–150)
VLDL CHOLESTEROL CAL: 56.2 mg/dL — ABNORMAL HIGH (ref 9–37)

## 2021-05-26 LAB — MAGNESIUM: MAGNESIUM: 1.7 mg/dL (ref 1.6–2.6)

## 2021-05-26 LAB — VITAMIN D 25 HYDROXY: VITAMIN D, TOTAL (25OH): 21.6 ng/mL (ref 20.0–80.0)

## 2021-05-26 MED ORDER — SERTRALINE 100 MG TABLET
ORAL_TABLET | Freq: Every day | ORAL | 3 refills | 90 days | Status: CP
Start: 2021-05-26 — End: 2022-05-26

## 2021-05-26 NOTE — Unmapped (Signed)
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  2nd kidney transplant - 2000-2002 - DD failed chronic rejection.  3rd kidney transplant - 2004 - 2007 - DD failed in 2007 chronic rejection.    - Post-transplant course complicated by delayed graft function.     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    Creatinine - value 1.37, stable   Urine analysis: protein trace / WBC <1 / RBC 2 .  Urine protein/creatinine ratio: negative (05/26/21)  DSA: B7 (4098) - 02/24/21    Last CMV checked: not detected - 03/05/21  Last BKV checked: not detected - 03/05/21    2) Immunosuppression: Myfortic 720 mg BID / Prednisone 5 mg every other day and 2.5 mg every other day / Belatacept monthly (last on 04/30/21)  - On belatacept due to biopsy findings.  - Changes in Immunosuppression - None today. Will continue current dose of prednisone until end of the year, then consider decreasing to 2.5mg  daily.  - Medications side effects: No    3) UTI: Asymptomatic today  - History of recent recurrent UTI's.   - Previously referred to urogyn given risk of future antibiotic resistance    4) Hypotension - Controlled - off midodrine now.  Goal for B.P > 100 systolic.   Changes in B.P medications - none.    5) Hypertension - BP 118/81 in clinic today.  - Stable, no changes.    6) Anemia - stable - Hgb 12.6  Goal for Hemoglobin > 11.0  Ferritin 14.6 (09/04/19)   - Continue iron supplement once daily.   - Vit C supplements     7) MBD - Calcium slightly elevated / Phosphorus 2.4 - Secondary hyperparathyroidism  On calcitriol 0.25 mcg daily    8) Electrolytes: Mild hypokalemia  - continue to monitor.    9) Leg swelling   - Continue chlorthalidone 25 mg daily.     10) Immunizations:  Influenza (inactivated only): received 10/30/2020  Pneumococcal vaccination (inactivated only): will ask patient  Covid-19 [employer]): 01/15/20, 02/05/20, 07/2020. Evusheld antibody injection received 03/05/21.    11) Cancer screening:  PAP smear - 11/06/19 negative   Mammogram - Age 61. Due, pt will schedule.  Dermatology - Pt will schedule appointment for cancer screening - Due.    12) Left UE swelling - patient following up in lymphedema clinic every 6 months.     13) High Cholesterol - Pt was previously on pravastatin 20 mg daily, but d/t all over muscle twitching discontinued it three weeks later.  On Zetia 10 mg daily. Tolerating well.    14) Anxiety: could be better controlled, 1-2x episodes per week  - Pt previously endorsed severe night sweats while on Celexa.  - Increase Zoloft from 50mg  to 100mg  daily.  - If anxiety episodes persist despite dose increase, consider Psych referral at next visit.    Follow up: 3 months or sooner if indicated.    History of Presenting  Illness:   Since patient's last visit in the transplant clinic - patient has been doing well in terms of transplant, taking transplant medications regularly, no episodes of rejection and no side effects of medications.    Patient continues to receive belatacept infusions (last on 04/30/21).    Patient presents today doing okay. Her weight has been fluctuating but overall stable. She has not required midodrine in the interim. Her anxiety is okay, though she has 1-2 episodes per week; experiences chest tightness and shuts down, feeling low motivation. This improves with deep breathing exercises. Continues to follow in lymphedema clinic about q 6 months; left arm is doing well.  Denies chest pain or tightness, shortness of breath, nausea, vomiting, diarrhea, fever, chills, abdominal pain, headache, lightheadedness, dizziness, lower extremity edema, recent infections, difficulty voiding, or dysuria.     Diabetes: No   HTN: Yes: Hypotension    Controlled:Yes   Adherence      With Medication: yes    With Follow up: yes    Functional Status: independent    Patient was found to have reflux nephropathy at age 21 and was on peritoneal dialysis briefly before receiving a living related renal transplant from mother. She had some thrombotic complication and underwent nephrectomy in 1997. She started dialysis and had a second kidney transplant at age 23 in 26. 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 She received a 3rd kidney transplant in 11/2002 which failed in 2007 due to chronic rejection.    She has decreased vision both eyes.    Review of Systems:     Fever or chills: negative  Sore throat: negative   Fatigue/malaise: negative   Weight loss or gain: negative   New skin rash/lump or bump: negative   Problems with teeth/gums: negative   Chest pain: negative   Cough or shortness of breath: negative   Swelling: negative   Abdominal pain/heartburn/nausea/vomiting or diarrhea: negative   Pain or bleeding when urinating: negative   Twitching/numbness or weakness: negative     Physical Exam:     BP 118/81 (BP Site: R Arm, BP Position: Sitting, BP Cuff Size: Large)  - Pulse 81  - Temp 36.1 ??C (96.9 ??F) (Temporal)  - Ht 162.6 cm (5' 4)  - Wt 94.3 kg (208 lb)  - BMI 35.70 kg/m??     Nursing note and vitals reviewed.   Constitutional: Oriented to person, place, and time. Appears well-developed and well-nourished. No distress.   HENT: Wearing mask   Head: Normocephalic and atraumatic.   Eyes: Right eye exhibits no discharge. Left eye exhibits no discharge. No scleral icterus.   Neck: Normal range of motion. Neck supple.   Cardiovascular: Normal rate and regular rhythm. Exam reveals no gallop and no friction rub. No murmur heard.   Pulmonary/Chest: Effort normal and breath sounds normal. No respiratory distress.   Abdominal: Soft. Non tender  Musculoskeletal: Normal range of motion. No edema and no tenderness.   Neurological: Alert and oriented to person, place, and time.   Skin: Skin is warm and dry. No rash noted. Not diaphoretic. No erythema. No pallor.   Psychiatric: Normal mood and affect. Behavior is normal. Judgment and thought content normal.     Renal Transplant History:    Race:  Caucasian   Age of recipient (at time of transplant): 40 y.o.   Cause of kidney disease: Reflux nephropathy   Native biopsy: No    Date of  transplant: 12/05/18   Type of transplant: KDPI - 39%    - Donor creatinine: 1.3    - Any co morbidities: No    - Infection in donor: No    - HLA Mismatch: N/A    - Ischemia time: 6h 22m    - Crossmatch: negative    - Donor kidney biopsy: No diagnostic abnormalities identified (12/05/18)     Induction: Campath - alemtuzumab   Maintenance IS at the time of transplant: tacrolimus, mycophenolate   DGF: Yes   Diabetes Onset after transplant: No    Allergies:   Allergies   Allergen Reactions   ??? Ancef [Cefazolin] Shortness Of Breath   ??? Adhesive Tape-Silicones      Other reaction(s): Other (See Comments)  Uncoded Allergy. Allergen: plastic tape, Other Reaction: blistering   ??? Demerol [Meperidine] Nausea And Vomiting   ??? Pravastatin      Other reaction(s): Other (See Comments)  Muscle Twitching        Current Medications:   Current Outpatient Medications   Medication Sig Dispense Refill   ??? acetaminophen (TYLENOL) 500 MG tablet Take 2 tablets (1,000 mg total) by mouth every six (6) hours as needed for pain. 100 tablet 0   ??? ascorbic acid, vitamin C, (ASCORBIC ACID) 500 MG tablet Take 500 mg by mouth daily.     ??? aspirin (ECOTRIN) 81 MG tablet Take 1 tablet (81 mg total) by mouth daily. 30 tablet 0   ??? calcitrioL (ROCALTROL) 0.25 MCG capsule Take 1 capsule (0.25 mcg total) by mouth daily. 30 capsule 11   ??? carboxymethylcellulose sodium (REFRESH CELLUVISC) 1 % DpGe Instill drops in affected eye(s) as directed as needed.     ??? chlorthalidone (HYGROTON) 25 MG tablet Take 1 tablet (25 mg total) by mouth daily. 30 tablet 11   ??? ezetimibe (ZETIA) 10 mg tablet Take 1 tablet (10 mg total) by mouth daily. 90 tablet 3   ??? ferrous sulfate 325 (65 FE) MG tablet Take 325 mg by mouth daily.     ??? MYFORTIC 180 mg EC tablet Take 4 tablets (720 mg total) by mouth Two (2) times a day. 240 tablet 11   ??? omeprazole (PRILOSEC) 40 MG capsule Take 1 capsule (40 mg total) by mouth daily. 30 capsule 0   ??? predniSONE (DELTASONE) 2.5 MG tablet Take 2 tablets (5 mg total) by mouth daily. 60 tablet 11   ??? sertraline (ZOLOFT) 50 MG tablet Take 1 tablet (50 mg total) by mouth daily. 90 tablet 3     No current facility-administered medications for this visit.       Past Medical History:   Past Medical History:   Diagnosis Date   ??? Bell's palsy    ??? Disease of thyroid gland    ??? ESRD (end stage renal disease) (CMS-HCC)    ??? Nonarteritic ischemic optic neuropathy    ??? Reflux nephropathy         Laboratory studies:     No results found for this or any previous visit (from the past 170 hour(s)).      Scribe's Attestation: Leeroy Bock, MD obtained and performed the history, physical exam and medical decision making elements that were entered into the chart.  Signed by Gaye Alken, Scribe, on May 26, 2021 at 8:44 AM.    Documentation assistance provided by the Scribe. I was present during the time the encounter was recorded. The information recorded by the Scribe was done at my direction and  has been reviewed and validated by me.

## 2021-05-27 LAB — CMV DNA, QUANTITATIVE, PCR: CMV VIRAL LD: NOT DETECTED

## 2021-05-28 ENCOUNTER — Ambulatory Visit: Admit: 2021-05-28 | Discharge: 2021-05-29 | Payer: MEDICARE

## 2021-05-28 MED ADMIN — belatacept (NULOJIX) 462.5 mg in sodium chloride (NS) 0.9 % 100 mL IVPB: 5 mg/kg | INTRAVENOUS | @ 19:00:00 | Stop: 2021-05-28

## 2021-05-28 NOTE — Unmapped (Signed)
[facility].  Patient has received previous doses of Belatacept in the Transplant Clinic and tolerated them well. Patient denies any new concerns or questions, denies any changes to her medical history, medications and allergies reviewed by Clinical research associate. 24G PIV placed in RAC, patient tolerated well, labs obtained per standing orders, no premeds ordered. Call light at patient's side, pt instructed on how to use.     1514 Belatacept 462.5 mg in 100 ml started per provider order     1545 Belatacept complete.      Patient tolerated infusion with no ill effects, PIV flushed with normal saline per infusion protocol, PIV D/C'd, gauze and coban applied. Vitals stable and comparable to baseline. Patient discharged from Infusion Center in stable condition with no acute distress.

## 2021-05-31 LAB — BK VIRUS QUANTITATIVE PCR, BLOOD: BK BLOOD RESULT: NOT DETECTED

## 2021-06-02 LAB — HLA DS POST TRANSPLANT
ANTI-DONOR DRW #1 MFI: 406 MFI
ANTI-DONOR DRW #2 MFI: 25 MFI
ANTI-DONOR HLA-A #1 MFI: 2 MFI
ANTI-DONOR HLA-A #2 MFI: 223 MFI
ANTI-DONOR HLA-B #1 MFI: 1463 MFI — ABNORMAL HIGH
ANTI-DONOR HLA-B #2 MFI: 61 MFI
ANTI-DONOR HLA-C #1 MFI: 93 MFI
ANTI-DONOR HLA-C #2 MFI: 0 MFI
ANTI-DONOR HLA-DQB #1 MFI: 0 MFI
ANTI-DONOR HLA-DQB #2 MFI: 63 MFI
ANTI-DONOR HLA-DR #1 MFI: 0 MFI
ANTI-DONOR HLA-DR #2 MFI: 40 MFI

## 2021-06-02 LAB — FSAB CLASS 1 ANTIBODY SPECIFICITY: HLA CLASS 1 ANTIBODY RESULT: POSITIVE

## 2021-06-02 LAB — FSAB CLASS 2 ANTIBODY SPECIFICITY: HLA CL2 AB RESULT: POSITIVE

## 2021-06-14 NOTE — Unmapped (Signed)
[facility] Refill Coordination Note    Specialty Medication(s) to be Shipped:   Transplant: Myfortic 180mg  and Prednisone 2.5mg     Other medication(s) to be shipped: calcitiol     Kimberly [name], DOB: 11-Apr-1981  Phone: [phone] (home)       All above HIPAA information was verified with patient.     Was a Nurse, learning disability used for this call? No    Completed refill call assessment today to schedule patient's medication shipment from the [facility] [phone]).  All relevant notes have been reviewed.     Specialty medication(s) and dose(s) confirmed: Regimen is correct and unchanged.   Changes to medications: [name] reports no changes at this time.  Changes to insurance: No  New side effects reported not previously addressed with a pharmacist or physician: None reported  Questions for the pharmacist: No    Confirmed patient received a Conservation officer, historic buildings and a Surveyor, mining with first shipment. The patient will receive a drug information handout for each medication shipped and additional FDA Medication Guides as required.       DISEASE/MEDICATION-SPECIFIC INFORMATION        N/A    SPECIALTY MEDICATION ADHERENCE     Medication Adherence    Patient reported X missed doses in the last month: 0  Specialty Medication: predniSONE (DELTASONE) 2.5 MG tablet  Patient is on additional specialty medications: Yes  Additional Specialty Medications: MYFORTIC 180 mg EC tablet  Patient Reported Additional Medication X Missed Doses in the Last Month: 0      Myfortic 180mg    8 days worth of medication on hand.  Prednisone 2.5mg   8 days worth of medication on hand.        Were doses missed due to medication being on hold? No      REFERRAL TO PHARMACIST     Referral to the pharmacist: Not needed      [facility]     Shipping address confirmed in Epic.     Delivery Scheduled: Yes, Expected medication delivery date: 06/22/21.     Medication will be delivered via UPS to the prescription address in Epic WAM.    [place] A Afnan Emberton   [facility] Specialty Technician

## 2021-06-22 MED FILL — CALCITRIOL 0.25 MCG CAPSULE: ORAL | 30 days supply | Qty: 30 | Fill #6

## 2021-06-22 MED FILL — MYFORTIC 180 MG TABLET,DELAYED RELEASE: ORAL | 30 days supply | Qty: 240 | Fill #1

## 2021-06-22 MED FILL — PREDNISONE 2.5 MG TABLET: ORAL | 30 days supply | Qty: 60 | Fill #9

## 2021-06-26 DIAGNOSIS — Z940 Kidney transplant status: Principal | ICD-10-CM

## 2021-06-26 DIAGNOSIS — Z79899 Other long term (current) drug therapy: Principal | ICD-10-CM

## 2021-07-02 ENCOUNTER — Ambulatory Visit: Admit: 2021-07-02 | Discharge: 2021-07-03 | Payer: MEDICARE

## 2021-07-02 LAB — LIPID PANEL
CHOLESTEROL/HDL RATIO SCREEN: 8.5 — ABNORMAL HIGH (ref 1.0–4.5)
CHOLESTEROL: 205 mg/dL — ABNORMAL HIGH (ref <=200)
HDL CHOLESTEROL: 24 mg/dL — ABNORMAL LOW (ref 40–60)
NON-HDL CHOLESTEROL: 181 mg/dL — ABNORMAL HIGH (ref 70–130)
TRIGLYCERIDES: 534 mg/dL — ABNORMAL HIGH (ref 0–150)

## 2021-07-02 LAB — CBC W/ AUTO DIFF
BASOPHILS ABSOLUTE COUNT: 0 10*9/L (ref 0.0–0.1)
BASOPHILS RELATIVE PERCENT: 0.5 %
EOSINOPHILS ABSOLUTE COUNT: 0 10*9/L (ref 0.0–0.5)
EOSINOPHILS RELATIVE PERCENT: 0.4 %
HEMATOCRIT: 37.2 % (ref 34.0–44.0)
HEMOGLOBIN: 12.9 g/dL (ref 11.3–14.9)
LYMPHOCYTES ABSOLUTE COUNT: 1.4 10*9/L (ref 1.1–3.6)
LYMPHOCYTES RELATIVE PERCENT: 14.2 %
MEAN CORPUSCULAR HEMOGLOBIN CONC: 34.8 g/dL (ref 32.0–36.0)
MEAN CORPUSCULAR HEMOGLOBIN: 27.9 pg (ref 25.9–32.4)
MEAN CORPUSCULAR VOLUME: 80.3 fL (ref 77.6–95.7)
MEAN PLATELET VOLUME: 6.2 fL — ABNORMAL LOW (ref 6.8–10.7)
MONOCYTES ABSOLUTE COUNT: 0.6 10*9/L (ref 0.3–0.8)
MONOCYTES RELATIVE PERCENT: 5.8 %
NEUTROPHILS ABSOLUTE COUNT: 7.7 10*9/L (ref 1.8–7.8)
NEUTROPHILS RELATIVE PERCENT: 79.1 %
NUCLEATED RED BLOOD CELLS: 0 /100{WBCs} (ref <=4)
PLATELET COUNT: 356 10*9/L (ref 150–450)
RED BLOOD CELL COUNT: 4.63 10*12/L (ref 3.95–5.13)
RED CELL DISTRIBUTION WIDTH: 14.5 % (ref 12.2–15.2)
WBC ADJUSTED: 9.7 10*9/L (ref 3.6–11.2)

## 2021-07-02 LAB — COMPREHENSIVE METABOLIC PANEL
ALBUMIN: 4.4 g/dL (ref 3.4–5.0)
ALKALINE PHOSPHATASE: 88 U/L (ref 46–116)
ALT (SGPT): 55 U/L — ABNORMAL HIGH (ref 10–49)
ANION GAP: 11 mmol/L (ref 5–14)
AST (SGOT): 28 U/L (ref <=34)
BILIRUBIN TOTAL: 0.4 mg/dL (ref 0.3–1.2)
BLOOD UREA NITROGEN: 17 mg/dL (ref 9–23)
BUN / CREAT RATIO: 13
CALCIUM: 10.2 mg/dL (ref 8.7–10.4)
CHLORIDE: 101 mmol/L (ref 98–107)
CO2: 23.4 mmol/L (ref 20.0–31.0)
CREATININE: 1.33 mg/dL — ABNORMAL HIGH
EGFR CKD-EPI (2021) FEMALE: 52 mL/min/{1.73_m2} — ABNORMAL LOW (ref >=60)
GLUCOSE RANDOM: 118 mg/dL (ref 70–179)
POTASSIUM: 3.6 mmol/L (ref 3.4–4.8)
PROTEIN TOTAL: 7.1 g/dL (ref 5.7–8.2)
SODIUM: 135 mmol/L (ref 135–145)

## 2021-07-02 LAB — PROTEIN / CREATININE RATIO, URINE
CREATININE, URINE: 29.9 mg/dL
PROTEIN URINE: 6.7 mg/dL
PROTEIN/CREAT RATIO, URINE: 0.224

## 2021-07-02 LAB — MAGNESIUM: MAGNESIUM: 1.8 mg/dL (ref 1.6–2.6)

## 2021-07-02 MED ADMIN — belatacept (NULOJIX) 475 mg in sodium chloride (NS) 0.9 % 100 mL IVPB: 5 mg/kg | INTRAVENOUS | @ 18:00:00 | Stop: 2021-07-02

## 2021-07-02 NOTE — Unmapped (Signed)
Please udate therapy plan with labs that you want every four weeks with infusion. Thank you   Tereso Newcomer RN BSN TIC

## 2021-07-02 NOTE — Unmapped (Signed)
1400 Pt in for infusion. Pt denies any S\s of infection. Pt call light with in reach. Pt teaching regarding S\s of adverse reaction and how to use call light to summon staff if experiencing any  S\s during infusion. Pt verbalizing understanding. PT MD and Nurse coordinator both sent note to please update therapy plan with labs they want drawn with each infusion. There are no lab orders on the therapy plan as it stands.      1419   Belatacept 475mg  in  184ml@200ml /hr      1425    IV infusing. Pt denies any complaints. .       1450  Belatacept infusion complete. Pt denies any complaints.       1500 VSS noS-S of adverse reaction. Pt dcd home .

## 2021-07-05 LAB — CMV DNA, QUANTITATIVE, PCR: CMV VIRAL LD: NOT DETECTED

## 2021-07-06 DIAGNOSIS — Z940 Kidney transplant status: Principal | ICD-10-CM

## 2021-07-06 DIAGNOSIS — E785 Hyperlipidemia, unspecified: Principal | ICD-10-CM

## 2021-07-06 LAB — VITAMIN D 25 HYDROXY: VITAMIN D, TOTAL (25OH): 17.9 ng/mL — ABNORMAL LOW (ref 20.0–80.0)

## 2021-07-09 ENCOUNTER — Ambulatory Visit: Admit: 2021-07-09 | Discharge: 2021-07-10 | Payer: MEDICARE

## 2021-07-09 DIAGNOSIS — E781 Pure hyperglyceridemia: Principal | ICD-10-CM

## 2021-07-09 LAB — LIPID PANEL
CHOLESTEROL/HDL RATIO SCREEN: 4.5 (ref 1.0–4.5)
CHOLESTEROL: 178 mg/dL (ref <=200)
HDL CHOLESTEROL: 40 mg/dL (ref 40–60)
LDL CHOLESTEROL CALCULATED: 103 mg/dL — ABNORMAL HIGH (ref 40–99)
NON-HDL CHOLESTEROL: 138 mg/dL — ABNORMAL HIGH (ref 70–130)
TRIGLYCERIDES: 177 mg/dL — ABNORMAL HIGH (ref 0–150)
VLDL CHOLESTEROL CAL: 35.4 mg/dL (ref 9–37)

## 2021-07-09 MED ORDER — OMEGA-3 ACID ETHYL ESTERS 1 GRAM CAPSULE
ORAL_CAPSULE | Freq: Two times a day (BID) | ORAL | 3 refills | 90.00000 days | Status: CP
Start: 2021-07-09 — End: 2022-07-09

## 2021-07-09 MED ORDER — EZETIMIBE 10 MG TABLET
ORAL_TABLET | Freq: Every day | ORAL | 3 refills | 90 days | Status: CP
Start: 2021-07-09 — End: 2022-07-09

## 2021-07-20 NOTE — Unmapped (Signed)
[facility] Refill Coordination Note    Specialty Medication(s) to be Shipped:   Transplant: Myfortic 180mg  and Prednisone 2.5mg     Other medication(s) to be shipped: Calcitriol     Kimberly [name], DOB: [birth year]  Phone: [phone] (home)       All above HIPAA information was verified with patient.     Was a Nurse, learning disability used for this call? No    Completed refill call assessment today to schedule patient's medication shipment from the [facility] [phone]).  All relevant notes have been reviewed.     Specialty medication(s) and dose(s) confirmed: Regimen is correct and unchanged.   Changes to medications: [name] reports no changes at this time.  Changes to insurance: No  New side effects reported not previously addressed with a pharmacist or physician: None reported  Questions for the pharmacist: No    Confirmed patient received a Conservation officer, historic buildings and a Surveyor, mining with first shipment. The patient will receive a drug information handout for each medication shipped and additional FDA Medication Guides as required.       DISEASE/MEDICATION-SPECIFIC INFORMATION        N/A    SPECIALTY MEDICATION ADHERENCE     Medication Adherence    Patient reported X missed doses in the last month: 0  Specialty Medication: MYFORTIC 180 MG EC tablet (mycophenolate)  Patient is on additional specialty medications: Yes  Additional Specialty Medications: predniSONE 2.5 MG tablet (DELTASONE)  Patient Reported Additional Medication X Missed Doses in the Last Month: 0  Patient is on more than two specialty medications: No              Were doses missed due to medication being on hold? No    Myfortic 180 mg: 7 days of medicine on hand   Prednisone 2.5 mg: 7 days of medicine on hand       REFERRAL TO PHARMACIST     Referral to the pharmacist: Not needed      [facility]     Shipping address confirmed in Epic.     Delivery Scheduled: Yes, Expected medication delivery date: 07/22/21. Medication will be delivered via UPS to the prescription address in Epic W[facility].    Tera Helper   [facility] Specialty Pharmacist

## 2021-07-21 MED FILL — CALCITRIOL 0.25 MCG CAPSULE: ORAL | 30 days supply | Qty: 30 | Fill #7

## 2021-07-21 MED FILL — MYFORTIC 180 MG TABLET,DELAYED RELEASE: ORAL | 30 days supply | Qty: 240 | Fill #2

## 2021-07-21 MED FILL — PREDNISONE 2.5 MG TABLET: ORAL | 30 days supply | Qty: 60 | Fill #10

## 2021-07-30 ENCOUNTER — Ambulatory Visit: Admit: 2021-07-30 | Discharge: 2021-07-31 | Payer: MEDICARE

## 2021-07-30 LAB — PHOSPHORUS: PHOSPHORUS: 1.5 mg/dL — ABNORMAL LOW (ref 2.4–5.1)

## 2021-07-30 LAB — URINALYSIS
BILIRUBIN UA: NEGATIVE
BLOOD UA: NEGATIVE
GLUCOSE UA: NEGATIVE
KETONES UA: NEGATIVE
NITRITE UA: NEGATIVE
PH UA: 6.5 (ref 5.0–9.0)
PROTEIN UA: 30 — AB
RBC UA: <1 /HPF (ref <4)
SPECIFIC GRAVITY UA: 1.01 (ref 1.005–1.030)
SQUAMOUS EPITHELIAL: <1 /HPF (ref 0–5)
UROBILINOGEN UA: 0.2
WBC UA: 20 /HPF — ABNORMAL HIGH (ref 0–5)

## 2021-07-30 LAB — CBC W/ AUTO DIFF
BASOPHILS ABSOLUTE COUNT: 0.1 10*9/L (ref 0.0–0.1)
BASOPHILS RELATIVE PERCENT: 0.8 %
EOSINOPHILS ABSOLUTE COUNT: 0.1 10*9/L (ref 0.0–0.5)
EOSINOPHILS RELATIVE PERCENT: 0.6 %
HEMATOCRIT: 36.2 % (ref 34.0–44.0)
HEMOGLOBIN: 12.4 g/dL (ref 11.3–14.9)
LYMPHOCYTES ABSOLUTE COUNT: 1.4 10*9/L (ref 1.1–3.6)
LYMPHOCYTES RELATIVE PERCENT: 15 %
MEAN CORPUSCULAR HEMOGLOBIN CONC: 34.2 g/dL (ref 32.0–36.0)
MEAN CORPUSCULAR HEMOGLOBIN: 27.5 pg (ref 25.9–32.4)
MEAN CORPUSCULAR VOLUME: 80.5 fL (ref 77.6–95.7)
MEAN PLATELET VOLUME: 6.2 fL — ABNORMAL LOW (ref 6.8–10.7)
MONOCYTES ABSOLUTE COUNT: 0.4 10*9/L (ref 0.3–0.8)
MONOCYTES RELATIVE PERCENT: 4.6 %
NEUTROPHILS ABSOLUTE COUNT: 7.3 10*9/L (ref 1.8–7.8)
NEUTROPHILS RELATIVE PERCENT: 79 %
NUCLEATED RED BLOOD CELLS: 0 /100{WBCs} (ref <=4)
PLATELET COUNT: 316 10*9/L (ref 150–450)
RED BLOOD CELL COUNT: 4.5 10*12/L (ref 3.95–5.13)
RED CELL DISTRIBUTION WIDTH: 14.4 % (ref 12.2–15.2)
WBC ADJUSTED: 9.2 10*9/L (ref 3.6–11.2)

## 2021-07-30 LAB — MAGNESIUM: MAGNESIUM: 1.8 mg/dL (ref 1.6–2.6)

## 2021-07-30 LAB — COMPREHENSIVE METABOLIC PANEL
ALBUMIN: 4.4 g/dL (ref 3.4–5.0)
ALKALINE PHOSPHATASE: 84 U/L (ref 46–116)
ALT (SGPT): 53 U/L — ABNORMAL HIGH (ref 10–49)
ANION GAP: 9 mmol/L (ref 5–14)
BILIRUBIN TOTAL: 0.5 mg/dL (ref 0.3–1.2)
BLOOD UREA NITROGEN: 14 mg/dL (ref 9–23)
BUN / CREAT RATIO: 11
CALCIUM: 10.1 mg/dL (ref 8.7–10.4)
CHLORIDE: 98 mmol/L (ref 98–107)
CO2: 26.6 mmol/L (ref 20.0–31.0)
CREATININE: 1.23 mg/dL — ABNORMAL HIGH
EGFR CKD-EPI (2021) FEMALE: 57 mL/min/{1.73_m2} — ABNORMAL LOW (ref >=60)
GLUCOSE RANDOM: 91 mg/dL (ref 70–179)
PROTEIN TOTAL: 7.3 g/dL (ref 5.7–8.2)
SODIUM: 134 mmol/L — ABNORMAL LOW (ref 135–145)

## 2021-07-30 MED ADMIN — belatacept (NULOJIX) 462.5 mg in sodium chloride (NS) 0.9 % 100 mL IVPB: 5 mg/kg | INTRAVENOUS | @ 19:00:00 | Stop: 2021-07-30

## 2021-07-30 NOTE — Unmapped (Signed)
[facility] Belatacept infusion, S/P Kidney Transplant.     Patient has received previous doses of Belatacept in the Transplant Clinic and tolerated them well. Patient denies any new concerns or questions, denies any changes to her medical history, medications and allergies reviewed by Clinical research associate. 24G PIV placed, patient tolerated well, labs obtained per standing orders, no premeds ordered. Call light at patient's side, pt instructed on how to use.     1510 Belatacept 462.5 mg in 100 ml started per provider order     1540 Belatacept complete.      Patient tolerated infusion with no ill effects, PIV flushed with normal saline per infusion protocol, PIV D/C'd, gauze and coban applied. Vitals stable and comparable to baseline. Patient discharged from Infusion Center in stable condition with no acute distress. Next infusion appointment made for four weeks.98.2

## 2021-07-31 LAB — CMV DNA, QUANTITATIVE, PCR: CMV VIRAL LD: NOT DETECTED

## 2021-08-02 NOTE — Unmapped (Signed)
Called patient to review UTI s/s, last UC positive10,000 to 50,000 CFU/mL Enterococcus. Patient report no s/s of UTI.

## 2021-08-04 DIAGNOSIS — Z940 Kidney transplant status: Principal | ICD-10-CM

## 2021-08-04 DIAGNOSIS — N390 Urinary tract infection, site not specified: Principal | ICD-10-CM

## 2021-08-10 NOTE — Unmapped (Signed)
Patient husband called concerned that might have cancer. Will discuss with Dr Nestor Lewandowsky and follow up.  Diagnosis   ??  A. Urine, cytology:  ??  - Suspicious for high-grade urothelial carcinoma.    - Clusters of suspicious urothelial cells, some with glandular differentiation including cytoplasmic vacuoles and prominent nucleoli present.  - No decoy cells identified.

## 2021-08-16 NOTE — Unmapped (Signed)
[facility] Clinical Assessment & Refill Coordination Note    Kimberly [name], DOB: 05-Aug-1981  Phone: [phone] (home)     All above HIPAA information was verified with patient.     Was a Nurse, learning disability used for this call? No    Specialty Medication(s):   Transplant: Myfortic 180mg  and Prednisone 2.5mg      Current Outpatient Medications   Medication Sig Dispense Refill   ??? acetaminophen (TYLENOL) 500 MG tablet Take 2 tablets (1,000 mg total) by mouth every six (6) hours as needed for pain. 100 tablet 0   ??? ascorbic acid, vitamin C, (ASCORBIC ACID) 500 MG tablet Take 500 mg by mouth daily.     ??? aspirin (ECOTRIN) 81 MG tablet Take 1 tablet (81 mg total) by mouth daily. 30 tablet 0   ??? calcitrioL (ROCALTROL) 0.25 MCG capsule Take 1 capsule (0.25 mcg total) by mouth daily. 30 capsule 11   ??? carboxymethylcellulose sodium (REFRESH CELLUVISC) 1 % DpGe Instill drops in affected eye(s) as directed as needed.     ??? chlorthalidone (HYGROTON) 25 MG tablet Take 1 tablet (25 mg total) by mouth daily. 30 tablet 11   ??? ezetimibe (ZETIA) 10 mg tablet Take 1 tablet (10 mg total) by mouth daily. 90 tablet 3   ??? ferrous sulfate 325 (65 FE) MG tablet Take 325 mg by mouth daily.     ??? MYFORTIC 180 mg EC tablet Take 4 tablets (720 mg total) by mouth Two (2) times a day. 240 tablet 11   ??? omega-3 acid ethyl esters (LOVAZA) 1 gram capsule Take 1 capsule (1 g total) by mouth Two (2) times a day. E78.1 hypertriglyceridemia 180 capsule 3   ??? omeprazole (PRILOSEC) 40 MG capsule Take 1 capsule (40 mg total) by mouth daily. 30 capsule 0   ??? predniSONE (DELTASONE) 2.5 MG tablet Take 2 tablets (5 mg total) by mouth daily. 60 tablet 11   ??? sertraline (ZOLOFT) 100 MG tablet Take 1 tablet (100 mg total) by mouth daily. 90 tablet 3     No current facility-administered medications for this visit.        Changes to medications: Alla reports no changes at this time.    Allergies   Allergen Reactions   ??? Ancef [Cefazolin] Shortness Of Breath   ??? Adhesive Tape-Silicones      Other reaction(s): Other (See Comments)  Uncoded Allergy. Allergen: plastic tape, Other Reaction: blistering   ??? Demerol [Meperidine] Nausea And Vomiting   ??? Pravastatin      Other reaction(s): Other (See Comments)  Muscle Twitching       Changes to allergies: No    SPECIALTY MEDICATION ADHERENCE     Myfortic 180 mg: 10 days of medicine on hand   Prednisone 2.5 mg: 10 days of medicine on hand       Medication Adherence    Patient reported X missed doses in the last month: 0  Specialty Medication: MYfortic 180mg   Patient is on additional specialty medications: Yes  Additional Specialty Medications: Prednisone 2.5mg   Patient Reported Additional Medication X Missed Doses in the Last Month: 0  Patient is on more than two specialty medications: No          Specialty medication(s) dose(s) confirmed: Regimen is correct and unchanged.     Are there any concerns with adherence? No    Adherence counseling provided? Not needed    CLINICAL MANAGEMENT AND INTERVENTION      Clinical Benefit  Assessment:    Do you feel the medicine is effective or helping your condition? Yes    Clinical Benefit counseling provided? Not needed    Adverse Effects Assessment:    Are you experiencing any side effects? No    Are you experiencing difficulty administering your medicine? No    Quality of Life Assessment:         How many days over the past month did your kidney transplant  keep you from your normal activities? For example, brushing your teeth or getting up in the morning. 0    Have you discussed this with your provider? Not needed    Acute Infection Status:    Acute infections noted within Epic:  No active infections  Patient reported infection: None    Therapy Appropriateness:    Is therapy appropriate and patient progressing towards therapeutic goals? Yes, therapy is appropriate and should be continued    DISEASE/MEDICATION-SPECIFIC INFORMATION      N/A    PATIENT SPECIFIC NEEDS     - Does the patient have any physical, cognitive, or cultural barriers? No    - Is the patient high risk? Yes, patient is taking a REMS drug. Medication is dispensed in compliance with REMS program    - Does the patient require a Care Management Plan? No     - Does the patient require physician intervention or other additional services (i.e. nutrition, smoking cessation, social work)? No      SHIPPING     Specialty Medication(s) to be Shipped:   Transplant:  mycophenolic acid 180mg  and Prednisone 2.5mg     Other medication(s) to be shipped: calcitriol     Changes to insurance: No    Delivery Scheduled: Yes, Expected medication delivery date: 08/24/21.     Medication will be delivered via UPS to the confirmed prescription address in [facility].    The patient will receive a drug information handout for each medication shipped and additional FDA Medication Guides as required.  Verified that patient has previously received a Conservation officer, historic buildings and a Surveyor, mining.    The patient or caregiver noted above participated in the development of this care plan and knows that they can request review of or adjustments to the care plan at any time.      All of the patient's questions and concerns have been addressed.    Tera Helper   [facility] Specialty Pharmacist

## 2021-08-23 DIAGNOSIS — Z940 Kidney transplant status: Principal | ICD-10-CM

## 2021-08-23 MED ORDER — MYCOPHENOLATE SODIUM 180 MG TABLET,DELAYED RELEASE
ORAL_TABLET | Freq: Two times a day (BID) | ORAL | 11 refills | 30 days | Status: CP
Start: 2021-08-23 — End: 2022-08-23
  Filled 2021-08-23: qty 240, 30d supply, fill #0

## 2021-08-23 MED FILL — PREDNISONE 2.5 MG TABLET: ORAL | 30 days supply | Qty: 60 | Fill #11

## 2021-08-23 MED FILL — CALCITRIOL 0.25 MCG CAPSULE: ORAL | 30 days supply | Qty: 30 | Fill #8

## 2021-08-24 DIAGNOSIS — Z940 Kidney transplant status: Principal | ICD-10-CM

## 2021-08-24 MED ORDER — MYCOPHENOLATE MOFETIL 500 MG TABLET
ORAL_TABLET | Freq: Two times a day (BID) | ORAL | 11 refills | 30.00000 days | Status: CP
Start: 2021-08-24 — End: 2022-08-24
  Filled 2021-09-27: qty 120, 30d supply, fill #0

## 2021-08-24 NOTE — Unmapped (Signed)
[facility] Clinical Assessment & Refill Coordination Note    Kimberly [name], DOB: [birth year]  Phone: [phone] (home)     All above HIPAA information was verified with patient.     Was a Nurse, learning disability used for this call? No    Specialty Medication(s):   Transplant: mycophenolate mofetil 500mg ,  mycophenolic acid 180mg  and Prednisone 2.5mg      Current Outpatient Medications   Medication Sig Dispense Refill   ??? acetaminophen (TYLENOL) 500 MG tablet Take 2 tablets (1,000 mg total) by mouth every six (6) hours as needed for pain. 100 tablet 0   ??? ascorbic acid, vitamin C, (ASCORBIC ACID) 500 MG tablet Take 500 mg by mouth daily.     ??? aspirin (ECOTRIN) 81 MG tablet Take 1 tablet (81 mg total) by mouth daily. 30 tablet 0   ??? calcitrioL (ROCALTROL) 0.25 MCG capsule Take 1 capsule (0.25 mcg total) by mouth daily. 30 capsule 11   ??? carboxymethylcellulose sodium (REFRESH CELLUVISC) 1 % DpGe Instill drops in affected eye(s) as directed as needed.     ??? chlorthalidone (HYGROTON) 25 MG tablet Take 1 tablet (25 mg total) by mouth daily. 30 tablet 11   ??? ezetimibe (ZETIA) 10 mg tablet Take 1 tablet (10 mg total) by mouth daily. 90 tablet 3   ??? ferrous sulfate 325 (65 FE) MG tablet Take 325 mg by mouth daily.     ??? mycophenolate (CELLCEPT) 500 mg tablet Take 2 tablets (1,000 mg total) by mouth Two (2) times a day. 120 tablet 11   ??? omega-3 acid ethyl esters (LOVAZA) 1 gram capsule Take 1 capsule (1 g total) by mouth Two (2) times a day. E78.1 hypertriglyceridemia 180 capsule 3   ??? omeprazole (PRILOSEC) 40 MG capsule Take 1 capsule (40 mg total) by mouth daily. 30 capsule 0   ??? predniSONE (DELTASONE) 2.5 MG tablet Take 2 tablets (5 mg total) by mouth daily. 60 tablet 11   ??? sertraline (ZOLOFT) 100 MG tablet Take 1 tablet (100 mg total) by mouth daily. 90 tablet 3     No current facility-administered medications for this visit.        Changes to medications: see mycophen below    Allergies Allergen Reactions   ??? Ancef [Cefazolin] Shortness Of Breath   ??? Adhesive Tape-Silicones      Other reaction(s): Other (See Comments)  Uncoded Allergy. Allergen: plastic tape, Other Reaction: blistering   ??? Demerol [Meperidine] Nausea And Vomiting   ??? Pravastatin      Other reaction(s): Other (See Comments)  Muscle Twitching       Changes to allergies: No    SPECIALTY MEDICATION ADHERENCE     Mycophenolate 180mg   : 30 days of medicine on hand   Mycophenolate 500mg   : 0 days of medicine on hand   Prednisone 2.5mg   : 30 days of medicine on hand     Medication Adherence    Patient reported X missed doses in the last month: 0  Specialty Medication: mycophenolate 180mg   Patient is on additional specialty medications: Yes  Additional Specialty Medications: Mycophenolate 500mg   Patient Reported Additional Medication X Missed Doses in the Last Month: 0  Patient is on more than two specialty medications: Yes  Specialty Medication: prednisone 2.5mg   Patient Reported Additional Medication X Missed Doses in the Last Month: 0          Specialty medication(s) dose(s) confirmed: Patient reports changes to the regimen as follows: mycophenolate  will change to generic cellcept on next fill - see onboarding from today     Are there any concerns with adherence? No    Adherence counseling provided? Not needed    CLINICAL MANAGEMENT AND INTERVENTION      Clinical Benefit Assessment:    Do you feel the medicine is effective or helping your condition? Yes    Clinical Benefit counseling provided? Not needed    Adverse Effects Assessment:    Are you experiencing any side effects? No    Are you experiencing difficulty administering your medicine? No    Quality of Life Assessment:         How many days over the past month did your transplant  keep you from your normal activities? For example, brushing your teeth or getting up in the morning. 0    Have you discussed this with your provider? Not needed    Acute Infection Status:    Acute infections noted within Epic:  No active infections  Patient reported infection: None    Therapy Appropriateness:    Is therapy appropriate and patient progressing towards therapeutic goals? Yes, therapy is appropriate and should be continued    DISEASE/MEDICATION-SPECIFIC INFORMATION      N/A    PATIENT SPECIFIC NEEDS     - Does the patient have any physical, cognitive, or cultural barriers? No    - Is the patient high risk? Yes, patient is taking a REMS drug. Medication is dispensed in compliance with REMS program    - Does the patient require a Care Management Plan? No     - Does the patient require physician intervention or other additional services (i.e. nutrition, smoking cessation, social work)? No      SHIPPING     Specialty Medication(s) to be Shipped:   na    Other medication(s) to be shipped: No additional medications requested for fill at this time     Changes to insurance: No    Delivery Scheduled: Yes, Expected medication delivery date: supply on hand, wants call back in 3 weeks.     Medication will be delivered via na to the confirmed na address in [facility].    The patient will receive a drug information handout for each medication shipped and additional FDA Medication Guides as required.  Verified that patient has previously received a Conservation officer, historic buildings and a Surveyor, mining.    The patient or caregiver noted above participated in the development of this care plan and knows that they can request review of or adjustments to the care plan at any time.      All of the patient's questions and concerns have been addressed.    Thad Ranger   [facility] Specialty Pharmacist

## 2021-08-24 NOTE — Unmapped (Signed)
The following medication is onboarded in this note:  1. Mycophenolate (generic cellcept) - $35 / 30 days    [facility]   Patient Onboarding/Medication Counseling    Kimberly [name] is a 40 y.o. female with kidney transplant who I am counseling today on change in formulation/salt form of therapy.  I am speaking to the patient. Patient currently taking mycophenolate generic myfortic. Changing to cellcept generic due to cost. cpp LC ok'ed [place] to make this change. Pt said she was on cellcept  In past.    Was a Nurse, learning disability used for this call? No    Verified patient's date of birth / HIPAA.    Specialty medication(s) to be sent: na      Non-specialty medications/supplies to be sent: na      Medications not needed at this time: na     The patient declined counseling on medication administration, missed dose instructions, goals of therapy, side effects and monitoring parameters, warnings and precautions, drug/food interactions and storage, handling precautions, and disposal because they have taken the medication previously. The information in the declined sections below are for informational purposes only and was not discussed with patient.       Cellcept (mycopheonlate mofetil)    Medication & Administration     Dosage: Take 2 tablets (1000mg  total) by mouth two times daily    Administration:   ??? Take by mouth with or without food.   ??? Taking with food can minimize GI side effects.   ??? Swallow capsules whole, do not crush or chew.  ??? Oral suspension should be shaken well prior to administration.  Do not mix with other medications and discard any unused portion 60 days after constitution.      Adherence/Missed dose instructions:  Take a missed dose as soon as you remember it . If it is close to the time of your next dose, skip the missed dose and resume your normal schedule.Never take 2 doses to try and catch up from a missed dose.    Goals of Therapy     Prevent organ rejection    Side Effects & Monitoring Parameters     ??? Feeling tired or weak  ??? Shakiness  ??? Trouble sleeping  ??? Diarrhea, abdominal pain, nausea, vomiting, constipation or decreased appetite  ??? Decreases in blood counts   ??? Back or joint pain  ??? Hypertension or hypotension  ??? High blood sugar  ??? Headache  ??? Skin rash    The following side effects should be reported to the provider:  ??? Reduced immune function - report signs of infection such as fever; chills; body aches; very bad sore throat; ear or sinus pain; cough; more sputum or change in color of sputum; pain with passing urine; wound that will not heal, etc.  Also at a slightly higher risk of some malignancies (mainly skin and blood cancers) due to this reduced immune function.  ??? Allergic reaction (rash, hives, swelling, shortness of breath)  ??? High blood sugar (confusion, feeling sleepy, more thirst, more hungry, passing urine more often, flushing, fast breathing, or breath that smells like fruit)  ??? Electrolyte issues (mood changes, confusion, muscle pain or weakness, a heartbeat that does not feel normal, seizures, not hungry, or very bad upset stomach or throwing up)  ??? High or low blood pressure (bad headache or dizziness, passing out, or change in eyesight)  ??? Kidney issues (unable to pass urine, change in how much urine is passed, blood  in the urine, or a big weight gain)  ??? Skin (oozing, heat, swelling, redness, or pain), UTI and other infections   ??? Chest pain or pressure  ??? Abnormal heartbeat  ??? Unexplained bleeding or bruising  ??? Abnormal burning, numbness, or tingling  ??? Muscle cramps,  ??? Yellowing of skin or eyes    Monitoring parameters  ??? Pregnancy   ??? CBC   ??? Renal and hepatic function    Contraindications, Warnings, & Precautions     ??? *This is a REMS drug and an FDA-approved patient medication guide will be printed with each dispensation  ??? Black Box Warning: Infections   ??? Black Box Warning: Lymphoproliferative disorders - risk of development of lymphoma and skin malignancy is increased  ??? Black Box Warning: Use during pregnancy is associated with increased risks of first trimester pregnancy loss and congenital malformations.   ??? Black Box Warning: Females of reproductive potential should use contraception during treatment and for 6 weeks after therapy is discontinued  ??? CNS depression  ??? New or reactivated viral infections  ??? Neutropenia  ??? Female patients and/or their female partners should use effective contraception during treatment of the female patient and for at least 3 months after last dose.  ??? Breastfeeding is not recommended during therapy and for 6 weeks after last dose    Drug/Food Interactions     ??? Medication list reviewed in Epic. The patient was instructed to inform the care team before taking any new medications or supplements. no interactions noted that clinic is not already monitoring.   ??? Separate doses of antacids and this medication  ??? Check with your doctor before getting any vaccinations    Storage, Handling Precautions, & Disposal     ??? Store at room temperature in a dry place  ??? This medication is considered hazardous. Wash hands after handling and store out of reach or others, including children and pets.        Current Medications (including OTC/herbals), Comorbidities and Allergies     Current Outpatient Medications   Medication Sig Dispense Refill   ??? acetaminophen (TYLENOL) 500 MG tablet Take 2 tablets (1,000 mg total) by mouth every six (6) hours as needed for pain. 100 tablet 0   ??? ascorbic acid, vitamin C, (ASCORBIC ACID) 500 MG tablet Take 500 mg by mouth daily.     ??? aspirin (ECOTRIN) 81 MG tablet Take 1 tablet (81 mg total) by mouth daily. 30 tablet 0   ??? calcitrioL (ROCALTROL) 0.25 MCG capsule Take 1 capsule (0.25 mcg total) by mouth daily. 30 capsule 11   ??? carboxymethylcellulose sodium (REFRESH CELLUVISC) 1 % DpGe Instill drops in affected eye(s) as directed as needed.     ??? chlorthalidone (HYGROTON) 25 MG tablet Take 1 tablet (25 mg total) by mouth daily. 30 tablet 11   ??? ezetimibe (ZETIA) 10 mg tablet Take 1 tablet (10 mg total) by mouth daily. 90 tablet 3   ??? ferrous sulfate 325 (65 FE) MG tablet Take 325 mg by mouth daily.     ??? mycophenolate (CELLCEPT) 500 mg tablet Take 2 tablets (1,000 mg total) by mouth Two (2) times a day. 120 tablet 11   ??? omega-3 acid ethyl esters (LOVAZA) 1 gram capsule Take 1 capsule (1 g total) by mouth Two (2) times a day. E78.1 hypertriglyceridemia 180 capsule 3   ??? omeprazole (PRILOSEC) 40 MG capsule Take 1 capsule (40 mg total) by mouth daily. 30 capsule 0   ???  predniSONE (DELTASONE) 2.5 MG tablet Take 2 tablets (5 mg total) by mouth daily. 60 tablet 11   ??? sertraline (ZOLOFT) 100 MG tablet Take 1 tablet (100 mg total) by mouth daily. 90 tablet 3     No current facility-administered medications for this visit.       Allergies   Allergen Reactions   ??? Ancef [Cefazolin] Shortness Of Breath   ??? Adhesive Tape-Silicones      Other reaction(s): Other (See Comments)  Uncoded Allergy. Allergen: plastic tape, Other Reaction: blistering   ??? Demerol [Meperidine] Nausea And Vomiting   ??? Pravastatin      Other reaction(s): Other (See Comments)  Muscle Twitching       Patient Active Problem List   Diagnosis   ??? Chronic skin ulcer (CMS-HCC)   ??? Kidney replaced by transplant   ??? Anemia   ??? Ischemic optic neuropathy of both eyes   ??? 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 Closed nondisplaced fracture of lateral malleolus of right fibula   ??? Chronic hypotension   ??? Hyperparathyroidism due to renal insufficiency (CMS-HCC)   ??? ESRD (end stage renal disease) (CMS-HCC)   ??? Bell's palsy   ??? PCT (porphyria cutanea tarda) (CMS-HCC)   ??? Fever   ??? Left arm swelling   ??? Encounter for long-term (current) drug use   ??? Immunosuppressed status (CMS-HCC)   ??? Hypertriglyceridemia       Reviewed and up to date in Epic.    Appropriateness of Therapy     Acute infections noted within Epic:  No active infections  Patient reported infection: None    Is medication and dose appropriate based on diagnosis and infection status? Yes    Prescription has been clinically reviewed: Yes      Baseline Quality of Life Assessment      How many days over the past month did your transplant  keep you from your normal activities? For example, brushing your teeth or getting up in the morning. 0    Financial Information     Medication Assistance provided: None Required    Anticipated copay of $35/30ds reviewed with patient. Verified delivery address.    Delivery Information     Scheduled delivery date: na- pt wants call back in 2-3 weeks    Expected start date: pt will start next month once finishes the generic myfortic she has on hand    Medication will be delivered via na to the na address in [place].  This shipment will not require a signature.      Explained the services we provide at [facility] and that each month we would call to set up refills.  Stressed importance of returning phone calls so that we could ensure they receive their medications in time each month.  Informed patient that we should be setting up refills 7-10 days prior to when they will run out of medication.  A pharmacist will reach out to perform a clinical assessment periodically.  Informed patient that a welcome packet, containing information about our pharmacy and other support services, a Notice of Privacy Practices, and a drug information handout will be sent.      The patient or caregiver noted above participated in the development of this care plan and knows that they can request review of or adjustments to the care plan at any time.      Patient or caregiver verbalized understanding of the above information as well  as how to contact the pharmacy at [phone] option 4 with any questions/concerns.  The pharmacy is open Monday through Friday 8:30am-4:30pm.  A pharmacist is available 24/7 via pager to answer any clinical questions they may have.    Patient Specific Needs     - Does the patient have any physical, cognitive, or cultural barriers? No    - Does the patient have adequate living arrangements? (i.e. the ability to store and take their medication appropriately) Yes    - Did you identify any home environmental safety or security hazards? No    - Patient prefers to have medications discussed with  Patient     - Is the patient or caregiver able to read and understand education materials at a high school level or above? Yes    - Patient's primary language is  English     - Is the patient high risk? Yes, patient is taking a REMS drug. Medication is dispensed in compliance with REMS program    - Does the patient require physician intervention or other additional services (i.e. dietary/nutrition, smoking cessation, social work)? No      Thad Ranger  [facility] Specialty Pharmacist

## 2021-08-24 NOTE — Unmapped (Signed)
[facility] Specialty Medication Onboarding    Specialty Medication: Mycophenolate 500mg  tablet  Prior Authorization: Not Required   Financial Assistance: No - copay card or gant not available   Final Copay/Day Supply: $35 / 30 days    Insurance Restrictions: Yes - max 1 month supply     Notes to Pharmacist: N/A    The triage team has completed the benefits investigation and has determined that the patient is able to fill this medication at [facility]. Please contact the patient to complete the onboarding or follow up with the prescribing physician as needed.

## 2021-08-27 ENCOUNTER — Ambulatory Visit: Admit: 2021-08-27 | Discharge: 2021-08-28 | Payer: MEDICARE

## 2021-08-27 LAB — MAGNESIUM: MAGNESIUM: 1.7 mg/dL (ref 1.6–2.6)

## 2021-08-27 LAB — CBC W/ AUTO DIFF
BASOPHILS ABSOLUTE COUNT: 0.1 10*9/L (ref 0.0–0.1)
BASOPHILS RELATIVE PERCENT: 0.7 %
EOSINOPHILS ABSOLUTE COUNT: 0 10*9/L (ref 0.0–0.5)
EOSINOPHILS RELATIVE PERCENT: 0.3 %
HEMATOCRIT: 37.3 % (ref 34.0–44.0)
HEMOGLOBIN: 12.6 g/dL (ref 11.3–14.9)
LYMPHOCYTES ABSOLUTE COUNT: 1.5 10*9/L (ref 1.1–3.6)
LYMPHOCYTES RELATIVE PERCENT: 13.5 %
MEAN CORPUSCULAR HEMOGLOBIN CONC: 33.7 g/dL (ref 32.0–36.0)
MEAN CORPUSCULAR HEMOGLOBIN: 26.8 pg (ref 25.9–32.4)
MEAN CORPUSCULAR VOLUME: 79.6 fL (ref 77.6–95.7)
MEAN PLATELET VOLUME: 6.8 fL (ref 6.8–10.7)
MONOCYTES ABSOLUTE COUNT: 0.4 10*9/L (ref 0.3–0.8)
MONOCYTES RELATIVE PERCENT: 3.4 %
NEUTROPHILS ABSOLUTE COUNT: 9 10*9/L — ABNORMAL HIGH (ref 1.8–7.8)
NEUTROPHILS RELATIVE PERCENT: 82.1 %
NUCLEATED RED BLOOD CELLS: 0 /100{WBCs} (ref <=4)
PLATELET COUNT: 415 10*9/L (ref 150–450)
RED BLOOD CELL COUNT: 4.69 10*12/L (ref 3.95–5.13)
RED CELL DISTRIBUTION WIDTH: 14.3 % (ref 12.2–15.2)
WBC ADJUSTED: 11 10*9/L (ref 3.6–11.2)

## 2021-08-27 LAB — URINALYSIS
BACTERIA: NONE SEEN /HPF
BILIRUBIN UA: NEGATIVE
BLOOD UA: NEGATIVE
GLUCOSE UA: NEGATIVE
KETONES UA: NEGATIVE
LEUKOCYTE ESTERASE UA: NEGATIVE
NITRITE UA: NEGATIVE
PH UA: 6.5 (ref 5.0–9.0)
PROTEIN UA: NEGATIVE
RBC UA: <1 /HPF (ref <=4)
SPECIFIC GRAVITY UA: 1.006 (ref 1.003–1.030)
SQUAMOUS EPITHELIAL: <1 /HPF (ref 0–5)
UROBILINOGEN UA: <2
WBC UA: 3 /HPF (ref 0–5)

## 2021-08-27 LAB — PHOSPHORUS: PHOSPHORUS: 2.4 mg/dL (ref 2.4–5.1)

## 2021-08-27 LAB — COMPREHENSIVE METABOLIC PANEL
ALBUMIN: 4.4 g/dL (ref 3.4–5.0)
ALKALINE PHOSPHATASE: 75 U/L (ref 46–116)
ALT (SGPT): 27 U/L (ref 10–49)
ANION GAP: 11 mmol/L (ref 5–14)
AST (SGOT): 16 U/L (ref <=34)
BILIRUBIN TOTAL: 0.4 mg/dL (ref 0.3–1.2)
BLOOD UREA NITROGEN: 17 mg/dL (ref 9–23)
BUN / CREAT RATIO: 14
CALCIUM: 10.6 mg/dL — ABNORMAL HIGH (ref 8.7–10.4)
CHLORIDE: 97 mmol/L — ABNORMAL LOW (ref 98–107)
CO2: 26.2 mmol/L (ref 20.0–31.0)
CREATININE: 1.25 mg/dL — ABNORMAL HIGH
EGFR CKD-EPI (2021) FEMALE: 56 mL/min/{1.73_m2} — ABNORMAL LOW (ref >=60)
GLUCOSE RANDOM: 89 mg/dL (ref 70–179)
POTASSIUM: 3.2 mmol/L — ABNORMAL LOW (ref 3.4–4.8)
PROTEIN TOTAL: 7.3 g/dL (ref 5.7–8.2)
SODIUM: 134 mmol/L — ABNORMAL LOW (ref 135–145)

## 2021-08-27 MED ADMIN — belatacept (NULOJIX) 475 mg in sodium chloride (NS) 0.9 % 100 mL IVPB: 5 mg/kg | INTRAVENOUS | @ 19:00:00 | Stop: 2021-08-27

## 2021-08-28 LAB — CMV DNA, QUANTITATIVE, PCR
CMV QUANT: <50 [IU]/mL — ABNORMAL HIGH (ref <0)
CMV VIRAL LD: DETECTED — AB

## 2021-08-28 NOTE — Unmapped (Signed)
Pt to [facility] for infusion of Belatacept. Pt denies new complaints/ infections. IV placed in right arm. VSS  labs drawn .   1507 Belatacept 475 mg  1535 Belatacept complete   VSS- PIV removed per protocol. Discharged to home without issue.

## 2021-08-29 NOTE — Unmapped (Signed)
Patient report no UTI symptoms UC+ 10,000 to 50,000 CFU/mL Enterococcus faecalis??Abnormal. Urinalysis with 3 WBC, no  nitrite or leucocyte esterase. No treatment at this time.    Results for LORETA, BLOUCH (MRN [id number]) as of 08/27/2021 14:37   Ref. Range 08/27/2021 14:37   Spec Grav, UA Latest Ref Range: 1.003 - 1.030  1.006   Color, UA Unknown Light Yellow   Clarity, UA Unknown Clear   pH, UA Latest Ref Range: 5.0 - 9.0  6.5   Leukocyte Esterase, UA Latest Ref Range: Negative  Negative   Nitrite, UA Latest Ref Range: Negative  Negative   Protein, UA Latest Ref Range: Negative  Negative   Glucose, UA Latest Ref Range: Negative  Negative   Ketones, UA Latest Ref Range: Negative  Negative   Urobilinogen, UA Latest Ref Range: <2.0 mg/dL  <8.1 mg/dL   Bilirubin, UA Latest Ref Range: Negative  Negative   Blood, UA Latest Ref Range: Negative  Negative   WBC, UA Latest Ref Range: 0 - 5 /HPF 3   RBC, UA Latest Ref Range: <=4 /HPF <1   Squam Epithel, UA Latest Ref Range: 0 - 5 /HPF <1   Bacteria, UA Latest Ref Range: None Seen /HPF None Seen

## 2021-09-05 DIAGNOSIS — Z940 Kidney transplant status: Principal | ICD-10-CM

## 2021-09-05 DIAGNOSIS — Z79899 Other long term (current) drug therapy: Principal | ICD-10-CM

## 2021-09-13 NOTE — Unmapped (Signed)
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  2nd kidney transplant - 2000-2002 - DD failed chronic rejection.  3rd kidney transplant - 2004 - 2007 - DD failed in 2007 chronic rejection.    - Post-transplant course complicated by delayed graft function.     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    Creatinine - value 1.25, stable   Urine analysis: protein neg / WBC 95 / RBC 1 - waiting for culture  Urine protein/creatinine ratio: 0.224 (07/02/21)  DSA: B7 (1610) - 05/26/21    Last CMV checked: <50 - 08/27/21  Last BKV checked: not detected - 03/05/21    2) Immunosuppression: Myfortic 720 mg BID / Prednisone 5 mg every other day and 2.5 mg every other day / Belatacept monthly (last on 08/27/21)  - On belatacept due to biopsy findings.  - Changes in Immunosuppression - None today. Will continue current dose of prednisone until end of the year, then consider decreasing to 2.5mg  daily.  - Medications side effects: No    3) UTI: Asymptomatic today  - History of recent recurrent UTI's.   - Previously referred to urogyn given risk of future antibiotic resistance    4) Hypotension - Controlled - off midodrine now.  Goal for B.P > 100 systolic.   Changes in B.P medications - none.    5) Hypertension - BP 108/71 in clinic today.  - Stable, no changes.    6) Anemia - stable - Hgb 12.6  Goal for Hemoglobin > 11.0  Ferritin 14.6 (09/04/19)   - Continue iron supplement once daily.   - Vit C supplements     7) MBD - Calcium slightly elevated 10.6 / Phosphorus 2.4 - Secondary hyperparathyroidism  On calcitriol 0.25 mcg daily    8) Electrolytes: Mild hypokalemia and hyponatremia - continue to monitor.    9) Leg swelling   - Decrease chlorthalidone to 12.5 mg daily.     10) Immunizations:  Influenza (inactivated only): received fall 2022  Pneumococcal vaccination (inactivated only): will ask patient  Covid-19 [employer]): 01/15/20, 02/05/20, 07/2020, 12/28/20. Evusheld antibody injection received 03/05/21.    11) Cancer screening:  PAP smear - 11/06/19 negative   Mammogram - Age 66. Due, pt will schedule.  Dermatology - Pt will schedule appointment for cancer screening - Due.    12) Left UE swelling - patient following up in lymphedema clinic every 6 months.     13) High Cholesterol - Pt was previously on pravastatin 20 mg daily, but d/t all over muscle twitching discontinued it three weeks later.  On Zetia 10 mg daily. Tolerating well.    14) Anxiety: improved  - Pt previously endorsed severe night sweats while on Celexa.  - Continue Zoloft 100mg  daily.      Follow up: 3 months.    History of Presenting Illness:   Since patient's last visit in the transplant clinic - patient has been doing well in terms of  transplant, taking transplant medications regularly, no episodes of rejection and no side effects of medications.    Patient continues to receive belatacept infusions (last on 08/27/21).    Today patient presents feeling well. Her BP is low in clinic 108/71, reports normally 110-112 at home and denies lightheadedness. She is switching Myfortic to CellCept next month. Notes her anxiety is improved. Lymphedema is unchanged. Denies recent infections.     Diabetes: No   HTN: Yes: Hypotension    Controlled:Yes   Adherence      With Medication: yes    With Follow up: yes    Functional Status: independent    Patient was found to have reflux nephropathy at age 53 and was on peritoneal dialysis briefly before receiving a living related renal transplant from mother. She had some thrombotic complication and underwent nephrectomy in 1997. She started dialysis and had a second kidney transplant at age 64 in 62. 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 She received a 3rd kidney transplant in 11/2002 which failed in 2007 due to chronic rejection.    She has decreased vision both eyes.    Review of Systems:     Fever or chills: negative  Sore throat: negative   Fatigue/malaise: negative   Weight loss or gain: negative   New skin rash/lump or bump: negative   Problems with teeth/gums: negative   Chest pain: negative   Cough or shortness of breath: negative   Swelling: negative   Abdominal pain/heartburn/nausea/vomiting or diarrhea: negative   Pain or bleeding when urinating: negative   Twitching/numbness or weakness: negative     Physical Exam:     BP 108/71 (BP Site: R Arm, BP Position: Sitting, BP Cuff Size: Large)  - Pulse 85  - Temp 36.6 ??C (97.9 ??F) (Temporal)  - Ht 162.6 cm (5' 4)  - Wt 95.2 kg (209 lb 12.8 oz)  - LMP 09/02/2021  - BMI 36.01 kg/m??     Nursing note and vitals reviewed.   Constitutional: Oriented to person, place, and time. Appears well-developed and well-nourished. No distress.   HENT: Wearing mask   Head: Normocephalic and atraumatic.   Eyes: Right eye exhibits no discharge. Left eye exhibits no discharge. No scleral icterus.   Neck: Normal range of motion. Neck supple.   Cardiovascular: Normal rate and regular rhythm. Exam reveals no gallop and no friction rub. No murmur heard.   Pulmonary/Chest: Effort normal and breath sounds normal. No respiratory distress.   Abdominal: Soft. Non tender  Musculoskeletal: Normal range of motion. No edema and no tenderness.   Neurological: Alert and oriented to person, place, and time.       Renal Transplant History:    Race:  Caucasian   Age of recipient (at time of transplant): 40 y.o.   Cause of kidney disease: Reflux nephropathy   Native biopsy: No    Date of transplant: 12/05/18   Type of transplant: KDPI - 39%    - Donor creatinine: 1.3    - Any co morbidities: No    - Infection in donor: No    - HLA Mismatch: N/A    - Ischemia time: 6h 44m    - Crossmatch: negative    - Donor kidney biopsy: No diagnostic abnormalities identified (12/05/18)     Induction: [place] - alemtuzumab   Maintenance IS at the time of transplant: tacrolimus, mycophenolate   DGF: Yes   Diabetes Onset after transplant:  No    Allergies:   Allergies   Allergen Reactions   ??? Ancef [Cefazolin] Shortness Of Breath   ??? Adhesive Tape-Silicones      Other reaction(s): Other (See Comments)  Uncoded Allergy. Allergen: plastic tape, Other Reaction: blistering   ??? Demerol [Meperidine] Nausea And Vomiting   ??? Pravastatin      Other reaction(s): Other (See Comments)  Muscle Twitching        Current Medications:   Current Outpatient Medications   Medication Sig Dispense Refill   ??? acetaminophen (TYLENOL) 500 MG tablet Take 2 tablets (1,000 mg total) by mouth every six (6) hours as needed for pain. 100 tablet 0   ??? ascorbic acid, vitamin C, (VITAMIN C) 500 MG tablet Take 500 mg by mouth daily.     ??? calcitrioL (ROCALTROL) 0.25 MCG capsule Take 1 capsule (0.25 mcg total) by mouth daily. 30 capsule 11   ??? carboxymethylcellulose sodium (REFRESH CELLUVISC) 1 % DpGe Instill drops in affected eye(s) as directed as needed.     ??? chlorthalidone (HYGROTON) 25 MG tablet Take 1 tablet (25 mg total) by mouth daily. 30 tablet 11   ??? ezetimibe (ZETIA) 10 mg tablet Take 1 tablet (10 mg total) by mouth daily. 90 tablet 3   ??? ferrous sulfate 325 (65 FE) MG tablet Take 325 mg by mouth daily.     ??? omega-3 acid ethyl esters (LOVAZA) 1 gram capsule Take 1 capsule (1 g total) by mouth Two (2) times a day. E78.1 hypertriglyceridemia 180 capsule 3   ??? omeprazole (PRILOSEC) 40 MG capsule Take 1 capsule (40 mg total) by mouth daily. 30 capsule 0   ??? predniSONE (DELTASONE) 2.5 MG tablet Take 2 tablets (5 mg total) by mouth daily. (Patient taking differently: Take 5 mg by mouth daily. Take 2.5mg  one day and 5mg  next day alternating.) 60 tablet 11   ??? sertraline (ZOLOFT) 100 MG tablet Take 1 tablet (100 mg total) by mouth daily. 90 tablet 3   ??? aspirin (ECOTRIN) 81 MG tablet Take 1 tablet (81 mg total) by mouth daily. 30 tablet 0   ??? mycophenolate (CELLCEPT) 500 mg tablet Take 2 tablets (1,000 mg total) by mouth Two (2) times a day. (Patient not taking: Reported on 09/22/2021) 120 tablet 11     No current facility-administered medications for this visit.       Past Medical History:   Past Medical History:   Diagnosis Date   ??? Bell's palsy    ??? Disease of thyroid gland    ??? ESRD (end stage renal disease) (CMS-HCC)    ??? Nonarteritic ischemic optic neuropathy    ??? Reflux nephropathy         Laboratory studies:     No results found for this or any previous visit (from the past 170 hour(s)).    Scribe's Attestation: Leeroy Bock, MD obtained and performed the history, physical exam and medical decision making elements that were entered into the chart.  Signed by Lauralee Evener, Scribe, on September 22, 2021 at 9:16 AM.    Documentation assistance provided by the Scribe. I was present during the time the encounter was recorded. The information recorded by the Scribe was done at my direction and has been reviewed and validated by me.

## 2021-09-20 NOTE — Unmapped (Signed)
[facility] Clinical Assessment & Refill Coordination Note    Kimberly [name], DOB: [birth year]  Phone: [phone] (home)     All above HIPAA information was verified with patient.     Was a Nurse, learning disability used for this call? No    Specialty Medication(s):   Transplant: mycophenolate mofetil 500mg ,  mycophenolic acid 180mg  and Prednisone 2.5mg      Current Outpatient Medications   Medication Sig Dispense Refill   ??? acetaminophen (TYLENOL) 500 MG tablet Take 2 tablets (1,000 mg total) by mouth every six (6) hours as needed for pain. 100 tablet 0   ??? ascorbic acid, vitamin C, (ASCORBIC ACID) 500 MG tablet Take 500 mg by mouth daily.     ??? aspirin (ECOTRIN) 81 MG tablet Take 1 tablet (81 mg total) by mouth daily. 30 tablet 0   ??? calcitrioL (ROCALTROL) 0.25 MCG capsule Take 1 capsule (0.25 mcg total) by mouth daily. 30 capsule 11   ??? carboxymethylcellulose sodium (REFRESH CELLUVISC) 1 % DpGe Instill drops in affected eye(s) as directed as needed.     ??? chlorthalidone (HYGROTON) 25 MG tablet Take 1 tablet (25 mg total) by mouth daily. 30 tablet 11   ??? ezetimibe (ZETIA) 10 mg tablet Take 1 tablet (10 mg total) by mouth daily. 90 tablet 3   ??? ferrous sulfate 325 (65 FE) MG tablet Take 325 mg by mouth daily.     ??? mycophenolate (CELLCEPT) 500 mg tablet Take 2 tablets (1,000 mg total) by mouth Two (2) times a day. 120 tablet 11   ??? omega-3 acid ethyl esters (LOVAZA) 1 gram capsule Take 1 capsule (1 g total) by mouth Two (2) times a day. E78.1 hypertriglyceridemia 180 capsule 3   ??? omeprazole (PRILOSEC) 40 MG capsule Take 1 capsule (40 mg total) by mouth daily. 30 capsule 0   ??? predniSONE (DELTASONE) 2.5 MG tablet Take 2 tablets (5 mg total) by mouth daily. 60 tablet 11   ??? sertraline (ZOLOFT) 100 MG tablet Take 1 tablet (100 mg total) by mouth daily. 90 tablet 3     No current facility-administered medications for this visit.        Changes to medications: Kimberly [name] reports no changes at this time.    Allergies   Allergen Reactions   ??? Ancef [Cefazolin] Shortness Of Breath   ??? Adhesive Tape-Silicones      Other reaction(s): Other (See Comments)  Uncoded Allergy. Allergen: plastic tape, Other Reaction: blistering   ??? Demerol [Meperidine] Nausea And Vomiting   ??? Pravastatin      Other reaction(s): Other (See Comments)  Muscle Twitching       Changes to allergies: No    SPECIALTY MEDICATION ADHERENCE     Mycophenolate 180mg   : 10 days of medicine on hand   Prednisone 2.5mg   : 10 days of medicine on hand   Mycophenolate 500mg   : 0 days of medicine on hand        Medication Adherence    Patient reported X missed doses in the last month: 0  Specialty Medication: mycophenolate 180mg   Patient is on additional specialty medications: Yes  Additional Specialty Medications: Mycophenolate 500mg   Patient Reported Additional Medication X Missed Doses in the Last Month: 0  Patient is on more than two specialty medications: Yes  Specialty Medication: prednisone 2.5mg   Patient Reported Additional Medication X Missed Doses in the Last Month: 0          Specialty medication(s) dose(s) confirmed:  Patient reports changes to the regimen as follows: patient will discuss with provider in Wed appt, may switch to generic cellcept from myfortic (see onboarding from last month) in next 10 days     Are there any concerns with adherence? No    Adherence counseling provided? Not needed    CLINICAL MANAGEMENT AND INTERVENTION      Clinical Benefit Assessment:    Do you feel the medicine is effective or helping your condition? Yes    Clinical Benefit counseling provided? Not needed    Adverse Effects Assessment:    Are you experiencing any side effects? No    Are you experiencing difficulty administering your medicine? No    Quality of Life Assessment:         How many days over the past month did your transplant  keep you from your normal activities? For example, brushing your teeth or getting up in the morning. 0    Have you discussed this with your provider? Not needed    Acute Infection Status:    Acute infections noted within Epic:  No active infections  Patient reported infection: None    Therapy Appropriateness:    Is therapy appropriate and patient progressing towards therapeutic goals? Yes, therapy is appropriate and should be continued    DISEASE/MEDICATION-SPECIFIC INFORMATION      N/A    PATIENT SPECIFIC NEEDS     - Does the patient have any physical, cognitive, or cultural barriers? No    - Is the patient high risk? Yes, patient is taking a REMS drug. Medication is dispensed in compliance with REMS program    - Does the patient require a Care Management Plan? No     - Does the patient require physician intervention or other additional services (i.e. nutrition, smoking cessation, social work)? No      SHIPPING     Specialty Medication(s) to be Shipped:   na    Other medication(s) to be shipped: No additional medications requested for fill at this time     Changes to insurance: No    Delivery Scheduled: Patient declined refill at this time due to supply on hand, has appt on Wed to discuss possible transition from generic myfortic to generic cellcept (see onboarding ntoe from last month), would like call back later in the week.     Medication will be delivered via na to the confirmed na address in [facility].    The patient will receive a drug information handout for each medication shipped and additional FDA Medication Guides as required.  Verified that patient has previously received a Conservation officer, historic buildings and a Surveyor, mining.    The patient or caregiver noted above participated in the development of this care plan and knows that they can request review of or adjustments to the care plan at any time.      All of the patient's questions and concerns have been addressed.    Thad Ranger   [facility] Specialty Pharmacist

## 2021-09-22 ENCOUNTER — Ambulatory Visit: Admit: 2021-09-22 | Discharge: 2021-09-23 | Payer: MEDICARE | Attending: Nephrology | Primary: Nephrology

## 2021-09-22 DIAGNOSIS — Z940 Kidney transplant status: Principal | ICD-10-CM

## 2021-09-22 DIAGNOSIS — Z79899 Other long term (current) drug therapy: Principal | ICD-10-CM

## 2021-09-22 LAB — URINALYSIS
BILIRUBIN UA: NEGATIVE
GLUCOSE UA: NEGATIVE
KETONES UA: NEGATIVE
NITRITE UA: NEGATIVE
PH UA: 6 (ref 5.0–9.0)
PROTEIN UA: NEGATIVE
RBC UA: 1 /HPF (ref <4)
SPECIFIC GRAVITY UA: 1.015 (ref 1.005–1.030)
SQUAMOUS EPITHELIAL: 3 /HPF (ref 0–5)
UROBILINOGEN UA: 0.2
WBC UA: 95 /HPF — ABNORMAL HIGH (ref 0–5)

## 2021-09-22 NOTE — Unmapped (Signed)
AOBP: right   arm large  cuff   Average:108/71  Pulse:85  1st reading:109/73  Pulse:83  2nd reading:109/69  Pulse:86  3rd reading: 105/72 Pulse:85

## 2021-09-23 DIAGNOSIS — Z940 Kidney transplant status: Principal | ICD-10-CM

## 2021-09-23 MED ORDER — PREDNISONE 2.5 MG TABLET
ORAL_TABLET | Freq: Every day | ORAL | 11 refills | 30 days
Start: 2021-09-23 — End: 2022-09-23

## 2021-09-23 NOTE — Unmapped (Signed)
Transplant Coordinator, Clinic Visit   Pt seen today by transplant nephrologist and transplant pharmacist for follow up, reviewed medications and symptoms.     Assessment  BP:  108/71  BG: 89    Review of Systems  Constitutional: Negative for fever, chills, night sweats, unintentional weight loss, loss of appetite.  HEAD: Negative for headache or dizziness  Eyes: Negative for visual changes.  ENT: Negative for sore throat.  Cardiovascular: Negative for chest pain, pressure, or palpitations  Respiratory: Negative for shortness of breath.  Gastrointestinal: Negative for abdominal pain, nausea, vomiting, diarrhea or constipation.  Genitourinary: Negative for dysuria, polyuria,  hematuria, urgency, hesitancy, or ordor  Musculoskeletal: Negative for unilateral leg swelling  Skin/Hair: Negative for rash, itching, or hair loss  Neurological: Negative for numbness, tingling or hand tremors.  Pain: 0/10  Sleeping: 6-8 hours  Eating: 3 meals and snacks    Immunosuppression: Belatacept    No s/s of UTI reported at this visit  Urine Culture, Comprehensive 10,000 to 50,000 CFU/mL Enterococcus faecalis??Abnormal

## 2021-09-23 NOTE — Unmapped (Signed)
[facility] Refill Coordination Note    Specialty Medication(s) to be Shipped:   Transplant: mycophenolate mofetil 500mg  and Prednisone 2.5mg     Other medication(s) to be shipped: calcitriol     Kimberly [name], DOB: Oct 24, 1981  Phone: [phone] (home)       All above HIPAA information was verified with patient.     Was a Nurse, learning disability used for this call? No    Completed refill call assessment today to schedule patient's medication shipment from the [facility] [phone]).  All relevant notes have been reviewed.     Specialty medication(s) and dose(s) confirmed: Regimen is correct and unchanged.   Changes to medications: Madicyn reports no changes at this time.  Changes to insurance: No  New side effects reported not previously addressed with a pharmacist or physician: None reported  Questions for the pharmacist: No    Confirmed patient received a Conservation officer, historic buildings and a Surveyor, mining with first shipment. The patient will receive a drug information handout for each medication shipped and additional FDA Medication Guides as required.       DISEASE/MEDICATION-SPECIFIC INFORMATION        N/A    SPECIALTY MEDICATION ADHERENCE     Medication Adherence    Patient reported X missed doses in the last month: 0  Specialty Medication: Prednisone 2.5mg   Patient is on additional specialty medications: Yes  Additional Specialty Medications: Mycophenolate 500mg   Patient Reported Additional Medication X Missed Doses in the Last Month: 0  Patient is on more than two specialty medications: No        Were doses missed due to medication being on hold? No    Prednisone 2.5 mg: 6 days of medicine on hand   Mycophenolate 500 mg: 6 days of medicine on hand     REFERRAL TO PHARMACIST     Referral to the pharmacist: Not needed      [facility]     Shipping address confirmed in Epic.     Delivery Scheduled: Yes, Expected medication delivery date: 09/28/2021.     Medication will be delivered via UPS to the prescription address in Epic WAM.    [name] [facility] Specialty Technician

## 2021-09-24 ENCOUNTER — Ambulatory Visit: Admit: 2021-09-24 | Discharge: 2021-09-25 | Payer: MEDICARE

## 2021-09-24 LAB — CBC W/ AUTO DIFF
BASOPHILS ABSOLUTE COUNT: 0.1 10*9/L (ref 0.0–0.1)
BASOPHILS RELATIVE PERCENT: 0.9 %
EOSINOPHILS ABSOLUTE COUNT: 0 10*9/L (ref 0.0–0.5)
EOSINOPHILS RELATIVE PERCENT: 0.6 %
HEMATOCRIT: 36.9 % (ref 34.0–44.0)
HEMOGLOBIN: 12.1 g/dL (ref 11.3–14.9)
LYMPHOCYTES ABSOLUTE COUNT: 1.1 10*9/L (ref 1.1–3.6)
LYMPHOCYTES RELATIVE PERCENT: 14 %
MEAN CORPUSCULAR HEMOGLOBIN CONC: 32.8 g/dL (ref 32.0–36.0)
MEAN CORPUSCULAR HEMOGLOBIN: 26.4 pg (ref 25.9–32.4)
MEAN CORPUSCULAR VOLUME: 80.5 fL (ref 77.6–95.7)
MEAN PLATELET VOLUME: 6.8 fL (ref 6.8–10.7)
MONOCYTES ABSOLUTE COUNT: 0.5 10*9/L (ref 0.3–0.8)
MONOCYTES RELATIVE PERCENT: 5.7 %
NEUTROPHILS ABSOLUTE COUNT: 6.3 10*9/L (ref 1.8–7.8)
NEUTROPHILS RELATIVE PERCENT: 78.8 %
PLATELET COUNT: 363 10*9/L (ref 150–450)
RED BLOOD CELL COUNT: 4.59 10*12/L (ref 3.95–5.13)
RED CELL DISTRIBUTION WIDTH: 15.3 % — ABNORMAL HIGH (ref 12.2–15.2)
WBC ADJUSTED: 8 10*9/L (ref 3.6–11.2)

## 2021-09-24 LAB — COMPREHENSIVE METABOLIC PANEL
ALBUMIN: 4.4 g/dL (ref 3.4–5.0)
ALKALINE PHOSPHATASE: 72 U/L (ref 46–116)
ALT (SGPT): 34 U/L (ref 10–49)
ANION GAP: 11 mmol/L (ref 5–14)
AST (SGOT): 17 U/L (ref <=34)
BILIRUBIN TOTAL: 0.5 mg/dL (ref 0.3–1.2)
BLOOD UREA NITROGEN: 19 mg/dL (ref 9–23)
BUN / CREAT RATIO: 15
CALCIUM: 10.9 mg/dL — ABNORMAL HIGH (ref 8.7–10.4)
CHLORIDE: 98 mmol/L (ref 98–107)
CO2: 27.1 mmol/L (ref 20.0–31.0)
CREATININE: 1.24 mg/dL — ABNORMAL HIGH
EGFR CKD-EPI (2021) FEMALE: 56 mL/min/{1.73_m2} — ABNORMAL LOW (ref >=60)
GLUCOSE RANDOM: 98 mg/dL (ref 70–99)
POTASSIUM: 3.3 mmol/L — ABNORMAL LOW (ref 3.4–4.8)
PROTEIN TOTAL: 7.5 g/dL (ref 5.7–8.2)
SODIUM: 136 mmol/L (ref 135–145)

## 2021-09-24 LAB — PHOSPHORUS: PHOSPHORUS: 2.1 mg/dL — ABNORMAL LOW (ref 2.4–5.1)

## 2021-09-24 LAB — MAGNESIUM: MAGNESIUM: 1.7 mg/dL (ref 1.6–2.6)

## 2021-09-24 MED ADMIN — belatacept (NULOJIX) 475 mg in sodium chloride (NS) 0.9 % 100 mL IVPB: 5 mg/kg | INTRAVENOUS | @ 20:00:00 | Stop: 2021-09-24

## 2021-09-24 NOTE — Unmapped (Signed)
Pt to [facility] for infusion of Belatacept. Pt denies new complaints/ infections. VSS  labs drawn .   1505 Belatacept 475mg   1536 Belatacept complete   VSS- PIV removed per protocol. Discharged to home without issue.

## 2021-09-27 LAB — CMV DNA, QUANTITATIVE, PCR: CMV VIRAL LD: NOT DETECTED

## 2021-09-27 MED FILL — CALCITRIOL 0.25 MCG CAPSULE: ORAL | 30 days supply | Qty: 30 | Fill #9

## 2021-10-19 NOTE — Unmapped (Signed)
[facility] Refill Coordination Note    Specialty Medication(s) to be Shipped:   Transplant: mycophenolate mofetil 500mg     Other medication(s) to be shipped: calcitriol     Kimberly [name], DOB: Jan 11, 1981  Phone: [phone] (home)       All above HIPAA information was verified with patient.     Was a Nurse, learning disability used for this call? No    Completed refill call assessment today to schedule patient's medication shipment from the [facility] [phone]).  All relevant notes have been reviewed.     Specialty medication(s) and dose(s) confirmed: Regimen is correct and unchanged.   Changes to medications: Tamsyn reports no changes at this time.  Changes to insurance: No  New side effects reported not previously addressed with a pharmacist or physician: None reported  Questions for the pharmacist: No    Confirmed patient received a Conservation officer, historic buildings and a Surveyor, mining with first shipment. The patient will receive a drug information handout for each medication shipped and additional FDA Medication Guides as required.       DISEASE/MEDICATION-SPECIFIC INFORMATION        N/A    SPECIALTY MEDICATION ADHERENCE     Medication Adherence    Patient reported X missed doses in the last month: 0  Specialty Medication: mycophenolate (CELLCEPT) 500 mg tablet  Patient is on additional specialty medications: No        mycophenolate mofetil 500mg   8 days worth of medication on hand.        Were doses missed due to medication being on hold? No        REFERRAL TO PHARMACIST     Referral to the pharmacist: Not needed      [facility]     Shipping address confirmed in Epic.     Delivery Scheduled: Yes, Expected medication delivery date: 10/26/20.     Medication will be delivered via UPS to the prescription address in Epic WAM.    Kimberly [name]   [facility] Specialty Technician

## 2021-10-20 ENCOUNTER — Ambulatory Visit: Admit: 2021-10-20 | Discharge: 2021-10-21 | Payer: MEDICARE

## 2021-10-20 LAB — CBC W/ AUTO DIFF
BASOPHILS ABSOLUTE COUNT: 0 10*9/L (ref 0.0–0.1)
BASOPHILS RELATIVE PERCENT: 0.6 %
EOSINOPHILS ABSOLUTE COUNT: 0.1 10*9/L (ref 0.0–0.5)
EOSINOPHILS RELATIVE PERCENT: 0.9 %
HEMATOCRIT: 36.9 % (ref 34.0–44.0)
HEMOGLOBIN: 12.5 g/dL (ref 11.3–14.9)
LYMPHOCYTES ABSOLUTE COUNT: 1.2 10*9/L (ref 1.1–3.6)
LYMPHOCYTES RELATIVE PERCENT: 15.9 %
MEAN CORPUSCULAR HEMOGLOBIN CONC: 33.8 g/dL (ref 32.0–36.0)
MEAN CORPUSCULAR HEMOGLOBIN: 26.9 pg (ref 25.9–32.4)
MEAN CORPUSCULAR VOLUME: 79.6 fL (ref 77.6–95.7)
MEAN PLATELET VOLUME: 6.5 fL — ABNORMAL LOW (ref 6.8–10.7)
MONOCYTES ABSOLUTE COUNT: 0.3 10*9/L (ref 0.3–0.8)
MONOCYTES RELATIVE PERCENT: 4.5 %
NEUTROPHILS ABSOLUTE COUNT: 5.8 10*9/L (ref 1.8–7.8)
NEUTROPHILS RELATIVE PERCENT: 78.1 %
PLATELET COUNT: 303 10*9/L (ref 150–450)
RED BLOOD CELL COUNT: 4.64 10*12/L (ref 3.95–5.13)
RED CELL DISTRIBUTION WIDTH: 14.9 % (ref 12.2–15.2)
WBC ADJUSTED: 7.4 10*9/L (ref 3.6–11.2)

## 2021-10-20 LAB — COMPREHENSIVE METABOLIC PANEL
ALBUMIN: 4.1 g/dL (ref 3.4–5.0)
ALKALINE PHOSPHATASE: 74 U/L (ref 46–116)
ALT (SGPT): 41 U/L (ref 10–49)
ANION GAP: 11 mmol/L (ref 5–14)
BILIRUBIN TOTAL: 0.4 mg/dL (ref 0.3–1.2)
BLOOD UREA NITROGEN: 14 mg/dL (ref 9–23)
BUN / CREAT RATIO: 10
CALCIUM: 10.1 mg/dL (ref 8.7–10.4)
CHLORIDE: 101 mmol/L (ref 98–107)
CO2: 25 mmol/L (ref 20.0–31.0)
CREATININE: 1.35 mg/dL — ABNORMAL HIGH
EGFR CKD-EPI (2021) FEMALE: 51 mL/min/{1.73_m2} — ABNORMAL LOW (ref >=60)
GLUCOSE RANDOM: 80 mg/dL (ref 70–179)
PROTEIN TOTAL: 7.4 g/dL (ref 5.7–8.2)
SODIUM: 137 mmol/L (ref 135–145)

## 2021-10-20 LAB — PHOSPHORUS: PHOSPHORUS: 1.7 mg/dL — ABNORMAL LOW (ref 2.4–5.1)

## 2021-10-20 LAB — MAGNESIUM: MAGNESIUM: 1.5 mg/dL — ABNORMAL LOW (ref 1.6–2.6)

## 2021-10-20 MED ADMIN — belatacept (NULOJIX) 475 mg in sodium chloride (NS) 0.9 % 100 mL IVPB: 5 mg/kg | INTRAVENOUS | @ 20:00:00 | Stop: 2021-10-20

## 2021-10-20 NOTE — Unmapped (Signed)
[facility]. Patient denies any new concerns or questions, denies any changes to her medical history, medications and allergies reviewed by Clinical research associate. 24G PIV placed, patient tolerated well, labs obtained per standing orders, no premeds ordered. Call light at patient's side, pt instructed on how to use.     1503 Belatacept 475mg  in 100 ml started infusing per provider order.     1535 Belatacept complete.      Patient tolerated infusion with no ill effects, PIV flushed with normal saline per infusion protocol, PIV D/C'd, gauze and coban applied. Vitals stable and comparable to baseline. Patient discharged from Infusion Center in stable condition with no acute distress.Marland Kitchen

## 2021-10-21 LAB — CMV DNA, QUANTITATIVE, PCR: CMV VIRAL LD: NOT DETECTED

## 2021-10-22 MED FILL — MYCOPHENOLATE MOFETIL 500 MG TABLET: ORAL | 30 days supply | Qty: 120 | Fill #1

## 2021-10-22 MED FILL — CALCITRIOL 0.25 MCG CAPSULE: ORAL | 30 days supply | Qty: 30 | Fill #10

## 2021-10-27 DIAGNOSIS — R609 Edema, unspecified: Principal | ICD-10-CM

## 2021-10-27 DIAGNOSIS — Z940 Kidney transplant status: Principal | ICD-10-CM

## 2021-10-27 MED ORDER — CHLORTHALIDONE 25 MG TABLET
ORAL_TABLET | Freq: Every day | ORAL | 3 refills | 90.00000 days | Status: CP
Start: 2021-10-27 — End: 2022-10-27

## 2021-11-02 DIAGNOSIS — E781 Pure hyperglyceridemia: Principal | ICD-10-CM

## 2021-11-02 MED ORDER — OMEGA-3 ACID ETHYL ESTERS 1 GRAM CAPSULE
ORAL_CAPSULE | Freq: Two times a day (BID) | ORAL | 3 refills | 90.00000 days | Status: CP
Start: 2021-11-02 — End: 2022-11-02

## 2021-11-17 NOTE — Unmapped (Signed)
[facility] Refill Coordination Note    Specialty Medication(s) to be Shipped:   Transplant: mycophenolate mofetil 500mg     Other medication(s) to be shipped: calcitriol 0.82mcg     Kimberly [name], DOB: May 03, 1981  Phone: [phone] (home)       All above HIPAA information was verified with patient.     Was a Nurse, learning disability used for this call? No    Completed refill call assessment today to schedule patient's medication shipment from the [facility] [phone]).  All relevant notes have been reviewed.     Specialty medication(s) and dose(s) confirmed: Regimen is correct and unchanged.   Changes to medications: Dedra reports no changes at this time.  Changes to insurance: No  New side effects reported not previously addressed with a pharmacist or physician: None reported  Questions for the pharmacist: No    Confirmed patient received a Conservation officer, historic buildings and a Surveyor, mining with first shipment. The patient will receive a drug information handout for each medication shipped and additional FDA Medication Guides as required.       DISEASE/MEDICATION-SPECIFIC INFORMATION        N/A    SPECIALTY MEDICATION ADHERENCE     Medication Adherence    Patient reported X missed doses in the last month: 0  Specialty Medication: mycophenolate 500 mg tablet (CELLCEPT)  Patient is on additional specialty medications: No  Informant: patient              Were doses missed due to medication being on hold? No    Mycophenolate 500 mg: 11 days of medicine on hand       REFERRAL TO PHARMACIST     Referral to the pharmacist: Not needed      [facility]     Shipping address confirmed in Epic.     Delivery Scheduled: Yes, Expected medication delivery date: 11/23/21.     Medication will be delivered via UPS to the prescription address in Epic WAM.    Jasper Loser   [facility] Specialty Technician

## 2021-11-19 ENCOUNTER — Ambulatory Visit: Admit: 2021-11-19 | Discharge: 2021-11-20 | Payer: MEDICARE

## 2021-11-19 LAB — COMPREHENSIVE METABOLIC PANEL
ALBUMIN: 4.1 g/dL (ref 3.4–5.0)
ALKALINE PHOSPHATASE: 75 U/L (ref 46–116)
ALT (SGPT): 47 U/L (ref 10–49)
ANION GAP: 10 mmol/L (ref 5–14)
AST (SGOT): 28 U/L (ref <=34)
BILIRUBIN TOTAL: 0.4 mg/dL (ref 0.3–1.2)
BLOOD UREA NITROGEN: 14 mg/dL (ref 9–23)
BUN / CREAT RATIO: 11
CALCIUM: 10.2 mg/dL (ref 8.7–10.4)
CHLORIDE: 99 mmol/L (ref 98–107)
CO2: 28 mmol/L (ref 20.0–31.0)
CREATININE: 1.25 mg/dL — ABNORMAL HIGH
EGFR CKD-EPI (2021) FEMALE: 56 mL/min/{1.73_m2} — ABNORMAL LOW (ref >=60)
GLUCOSE RANDOM: 96 mg/dL (ref 70–179)
POTASSIUM: 3.8 mmol/L (ref 3.4–4.8)
PROTEIN TOTAL: 7.3 g/dL (ref 5.7–8.2)
SODIUM: 137 mmol/L (ref 135–145)

## 2021-11-19 LAB — URINALYSIS WITH MICROSCOPY
BILIRUBIN UA: NEGATIVE
GLUCOSE UA: NEGATIVE
KETONES UA: NEGATIVE
NITRITE UA: NEGATIVE
PH UA: 7 (ref 5.0–9.0)
PROTEIN UA: 100 — AB
RBC UA: 5 /HPF — ABNORMAL HIGH (ref <4)
SPECIFIC GRAVITY UA: 1.02 (ref 1.005–1.030)
SQUAMOUS EPITHELIAL: <1 /HPF (ref 0–5)
UROBILINOGEN UA: 0.2
WBC UA: >100 /HPF — ABNORMAL HIGH (ref 0–5)

## 2021-11-19 LAB — CBC W/ AUTO DIFF
BASOPHILS ABSOLUTE COUNT: 0.1 10*9/L (ref 0.0–0.1)
BASOPHILS RELATIVE PERCENT: 1.1 %
EOSINOPHILS ABSOLUTE COUNT: 0.1 10*9/L (ref 0.0–0.5)
EOSINOPHILS RELATIVE PERCENT: 0.9 %
HEMATOCRIT: 35.7 % (ref 34.0–44.0)
HEMOGLOBIN: 12 g/dL (ref 11.3–14.9)
LYMPHOCYTES ABSOLUTE COUNT: 1.4 10*9/L (ref 1.1–3.6)
LYMPHOCYTES RELATIVE PERCENT: 14.6 %
MEAN CORPUSCULAR HEMOGLOBIN CONC: 33.7 g/dL (ref 32.0–36.0)
MEAN CORPUSCULAR HEMOGLOBIN: 26.7 pg (ref 25.9–32.4)
MEAN CORPUSCULAR VOLUME: 79.1 fL (ref 77.6–95.7)
MEAN PLATELET VOLUME: 6.7 fL — ABNORMAL LOW (ref 6.8–10.7)
MONOCYTES ABSOLUTE COUNT: 0.6 10*9/L (ref 0.3–0.8)
MONOCYTES RELATIVE PERCENT: 6.2 %
NEUTROPHILS ABSOLUTE COUNT: 7.3 10*9/L (ref 1.8–7.8)
NEUTROPHILS RELATIVE PERCENT: 77.2 %
PLATELET COUNT: 344 10*9/L (ref 150–450)
RED BLOOD CELL COUNT: 4.51 10*12/L (ref 3.95–5.13)
RED CELL DISTRIBUTION WIDTH: 15.2 % (ref 12.2–15.2)
WBC ADJUSTED: 9.4 10*9/L (ref 3.6–11.2)

## 2021-11-19 LAB — PHOSPHORUS: PHOSPHORUS: 0.9 mg/dL — CL (ref 2.4–5.1)

## 2021-11-19 LAB — MAGNESIUM: MAGNESIUM: 1.6 mg/dL (ref 1.6–2.6)

## 2021-11-19 MED ORDER — POTASSIUM, SODIUM PHOSPHATES 280 MG-160 MG-250 MG ORAL POWDER PACKET
PACK | Freq: Three times a day (TID) | ORAL | 0 refills | 2 days | Status: CP
Start: 2021-11-19 — End: 2021-11-21

## 2021-11-19 MED ADMIN — belatacept (NULOJIX) 487.5 mg in sodium chloride (NS) 0.9 % 100 mL IVPB: 5 mg/kg | INTRAVENOUS | @ 20:00:00 | Stop: 2021-11-19

## 2021-11-19 NOTE — Unmapped (Signed)
[facility] Bel[facility]pt infusion, S/P Kidney Transplant.  Patient has received previous doses of Belatacept in the Transplant Clinic and tolerated them well. Patient denies any new concerns or questions, denies any changes to her medical history, medications and allergies reviewed by Clinical research associate. 24G PIV placed right arm, patient tolerated well, labs obtained per standing orders, no premeds ordered. Call light at patient's side, pt instructed on how to use.     1520 Belatacept 487.5 mg in 100 ml started per provider order     1550 Belatacept complete.      Patient tolerated infusion with no ill effects, PIV flushed with normal saline per infusion protocol, PIV D/C'd, gauze and coban applied. Vitals stable and comparable to baseline. Patient discharged from [facility]fusion Center in stable condition with no acute distress. Next infusion appointment made for four weeks.

## 2021-11-20 LAB — CMV DNA, QUANTITATIVE, PCR: CMV VIRAL LD: NOT DETECTED

## 2021-11-20 NOTE — Unmapped (Signed)
I got a page for a critical lab value this evening. Kimberly [name] phosphorus level was 0.9 on blood work earlier today.   I called patient to discuss her labs results. She is currently asymptomatic. Phos level has been trending down since December. I am sending Neutra Phos to her pharmacy. She will be taking 2 packs TID for 2 day. Will recheck phos levels on Monday. Will also try to increase her phos intake.    Henderson Cloud, MD  [facility] Division of Nephrology & Hypertension Fellow  PGY-5

## 2021-11-22 DIAGNOSIS — Z940 Kidney transplant status: Principal | ICD-10-CM

## 2021-11-22 MED FILL — MYCOPHENOLATE MOFETIL 500 MG TABLET: ORAL | 30 days supply | Qty: 120 | Fill #2

## 2021-11-22 MED FILL — CALCITRIOL 0.25 MCG CAPSULE: ORAL | 30 days supply | Qty: 30 | Fill #11

## 2021-11-26 ENCOUNTER — Ambulatory Visit: Admit: 2021-11-26 | Discharge: 2021-11-27 | Payer: MEDICARE

## 2021-11-26 DIAGNOSIS — Z940 Kidney transplant status: Principal | ICD-10-CM

## 2021-11-26 LAB — PHOSPHORUS: PHOSPHORUS: 2.7 mg/dL (ref 2.4–5.1)

## 2021-11-26 LAB — BASIC METABOLIC PANEL
ANION GAP: 9 mmol/L (ref 5–14)
BLOOD UREA NITROGEN: 21 mg/dL (ref 9–23)
BUN / CREAT RATIO: 16
CALCIUM: 10.7 mg/dL — ABNORMAL HIGH (ref 8.7–10.4)
CHLORIDE: 103 mmol/L (ref 98–107)
CO2: 27.7 mmol/L (ref 20.0–31.0)
CREATININE: 1.33 mg/dL — ABNORMAL HIGH
EGFR CKD-EPI (2021) FEMALE: 52 mL/min/{1.73_m2} — ABNORMAL LOW (ref >=60)
GLUCOSE RANDOM: 103 mg/dL (ref 70–179)
POTASSIUM: 3.8 mmol/L (ref 3.4–4.8)
SODIUM: 140 mmol/L (ref 135–145)

## 2021-11-30 DIAGNOSIS — Z940 Kidney transplant status: Principal | ICD-10-CM

## 2021-11-30 DIAGNOSIS — Z79899 Other long term (current) drug therapy: Principal | ICD-10-CM

## 2021-12-09 NOTE — Unmapped (Unsigned)
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  2nd kidney transplant - 2000-2002 - DD failed chronic rejection.  3rd kidney transplant - 2004 - 2007 - DD failed in 2007 chronic rejection.    - Post-transplant course complicated by delayed graft function.     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    Creatinine - value 1.25, stable   Urine analysis: protein neg / WBC 95 / RBC 1 - waiting for culture  Urine protein/creatinine ratio: 0.224 (07/02/21)  DSA: B7 (1610) - 05/26/21    Last CMV checked: <50 - 08/27/21  Last BKV checked: not detected - 03/05/21    2) Immunosuppression: Myfortic 720 mg BID / Prednisone 5 mg every other day and 2.5 mg every other day / Belatacept monthly (last on 08/27/21)  - On belatacept due to biopsy findings.  - Changes in Immunosuppression - None today. Will continue current dose of prednisone until end of the year, then consider decreasing to 2.5mg  daily.  - Medications side effects: No    3) UTI: Asymptomatic today  - History of recent recurrent UTI's.   - Previously referred to urogyn given risk of future antibiotic resistance    4) Hypotension - Controlled - off midodrine now.  Goal for B.P > 100 systolic.   Changes in B.P medications - none.    5) Hypertension - BP *** in clinic today.  - Stable, no changes.    6) Anemia - stable - Hgb 12.6  Goal for Hemoglobin > 11.0  Ferritin 14.6 (09/04/19)   - Continue iron supplement once daily.   - Vit C supplements     7) MBD - Calcium slightly elevated 10.6 / Phosphorus 2.4 - Secondary hyperparathyroidism  On calcitriol 0.25 mcg daily    8) Electrolytes: Mild hypokalemia and hyponatremia - continue to monitor.    9) Leg swelling   - Decrease chlorthalidone to 12.5 mg daily.     10) Immunizations:  Influenza (inactivated only): received fall 2022  Pneumococcal vaccination (inactivated only): will ask patient  Covid-19 [employer]): 01/15/20, 02/05/20, 07/2020, 12/28/20. Evusheld antibody injection received 03/05/21.    11) Cancer screening:  PAP smear - 11/06/19 negative   Mammogram - Age 68. Due, pt will schedule.  Dermatology - Pt will schedule appointment for cancer screening - Due.    12) Left UE swelling - patient following up in lymphedema clinic every 6 months.     13) High Cholesterol - Pt was previously on pravastatin 20 mg daily, but d/t all over muscle twitching discontinued it three weeks later.  On Zetia 10 mg daily. Tolerating well.    14) Anxiety: improved  - Pt previously endorsed severe night sweats while on Celexa.  - Continue Zoloft 100mg  daily.      Follow up: 3 months.***    History of Presenting Illness:   Since patient's last visit in the transplant clinic - patient has been doing well in terms of  transplant, taking transplant medications regularly, no episodes of rejection and no side effects of medications.    Patient continues to receive belatacept infusions (last on 11/19/21).    Today ***    Today patient presents feeling well. Her BP is low in clinic 108/71, reports normally 110-112 at home and denies lightheadedness. She is switching Myfortic to CellCept next month. Notes her anxiety is improved. Lymphedema is unchanged. Denies recent infections.     Diabetes: No   HTN: Yes: Hypotension    Controlled:Yes   Adherence      With Medication: yes    With Follow up: yes    Functional Status: independent    Patient was found to have reflux nephropathy at age 62 and was on peritoneal dialysis briefly before receiving a living related renal transplant from mother. She had some thrombotic complication and underwent nephrectomy in 1997. She started dialysis and had a second kidney transplant at age 46 in 40. 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 She received a 3rd kidney transplant in 11/2002 which failed in 2007 due to chronic rejection.    She has decreased vision both eyes.    Review of Systems:     Fever or chills: negative  Sore throat: negative   Fatigue/malaise: negative   Weight loss or gain: negative   New skin rash/lump or bump: negative   Problems with teeth/gums: negative   Chest pain: negative   Cough or shortness of breath: negative   Swelling: negative   Abdominal pain/heartburn/nausea/vomiting or diarrhea: negative   Pain or bleeding when urinating: negative   Twitching/numbness or weakness: negative     Physical Exam:     There were no vitals taken for this visit.    Nursing note and vitals reviewed.   Constitutional: Oriented to person, place, and time. Appears well-developed and well-nourished. No distress.   HENT: Wearing mask   Head: Normocephalic and atraumatic.   Eyes: Right eye exhibits no discharge. Left eye exhibits no discharge. No scleral icterus.   Neck: Normal range of motion. Neck supple.   Cardiovascular: Normal rate and regular rhythm. Exam reveals no gallop and no friction rub. No murmur heard.   Pulmonary/Chest: Effort normal and breath sounds normal. No respiratory distress.   Abdominal: Soft. Non tender  Musculoskeletal: Normal range of motion. No edema and no tenderness.   Neurological: Alert and oriented to person, place, and time.       Renal Transplant History:    Race:  Caucasian   Age of recipient (at time of transplant): 41 y.o.   Cause of kidney disease: Reflux nephropathy   Native biopsy: No    Date of transplant: 12/05/18   Type of transplant: KDPI - 39%    - Donor creatinine: 1.3    - Any co morbidities: No    - Infection in donor: No    - HLA Mismatch: N/A    - Ischemia time: 6h 39m    - Crossmatch: negative    - Donor kidney biopsy: No diagnostic abnormalities identified (12/05/18)     Induction: Campath - alemtuzumab   Maintenance IS at the time of transplant: tacrolimus, mycophenolate   DGF: Yes   Diabetes Onset after transplant: No    Allergies:   Allergies   Allergen Reactions   ??? Ancef [Cefazolin] Shortness Of Breath   ??? Adhesive Tape-Silicones      Other reaction(s): Other (See Comments)  Uncoded Allergy.  Allergen: plastic tape, Other Reaction: blistering   ??? Demerol [Meperidine] Nausea And Vomiting   ??? Pravastatin      Other reaction(s): Other (See Comments)  Muscle Twitching        Current Medications:   Current Outpatient Medications   Medication Sig Dispense Refill   ??? acetaminophen (TYLENOL) 500 MG tablet Take 2 tablets (1,000 mg total) by mouth every six (6) hours as needed for pain. 100 tablet 0   ??? ascorbic acid, vitamin C, (VITAMIN C) 500 MG tablet Take 500 mg by mouth daily.     ??? aspirin (ECOTRIN) 81 MG tablet Take 1 tablet (81 mg total) by mouth daily. 30 tablet 0   ??? calcitrioL (ROCALTROL) 0.25 MCG capsule Take 1 capsule (0.25 mcg total) by mouth daily. 30 capsule 11   ??? carboxymethylcellulose sodium (REFRESH CELLUVISC) 1 % DpGe Instill drops in affected eye(s) as directed as needed.     ??? chlorthalidone (HYGROTON) 25 MG tablet TAKE 1 TABLET (25 MG TOTAL) BY MOUTH DAILY. 90 tablet 3   ??? ezetimibe (ZETIA) 10 mg tablet Take 1 tablet (10 mg total) by mouth daily. 90 tablet 3   ??? ferrous sulfate 325 (65 FE) MG tablet Take 325 mg by mouth daily.     ??? mycophenolate (CELLCEPT) 500 mg tablet Take 2 tablets (1,000 mg total) by mouth Two (2) times a day. (Patient not taking: Reported on 09/22/2021) 120 tablet 11   ??? omega-3 acid ethyl esters (LOVAZA) 1 gram capsule Take 1 capsule (1 g total) by mouth Two (2) times a day. E78.1 hypertriglyceridemia 180 capsule 3   ??? omeprazole (PRILOSEC) 40 MG capsule Take 1 capsule (40 mg total) by mouth daily. 30 capsule 0   ??? predniSONE (DELTASONE) 2.5 MG tablet Take 2 tablets (5 mg total) by mouth daily. (Patient taking differently: Take 5 mg by mouth daily. Take 2.5mg  one day and 5mg  next day alternating.) 60 tablet 11   ??? sertraline (ZOLOFT) 100 MG tablet Take 1 tablet (100 mg total) by mouth daily. 90 tablet 3     No current facility-administered medications for this visit.       Past Medical History:   Past Medical History:   Diagnosis Date   ??? Bell's palsy    ??? Disease of thyroid gland    ??? ESRD (end stage renal disease) (CMS-HCC)    ??? Nonarteritic ischemic optic neuropathy    ??? Reflux nephropathy         Laboratory studies:     No results found for this or any previous visit (from the past 170 hour(s)).    ***ATTEST

## 2021-12-12 NOTE — Unmapped (Unsigned)
[facility]  New Patient Evaluation & Consultation    Referring Provider: Leeroy Bock, MD  PCP: Estevan Oaks, MD  Date of Service: 12/13/2021    SUBJECTIVE  Chief Complaint: No chief complaint on file.    History of Present Illness: Kimberly [name] is a 41 y.o. {PGM race:[phone]} female seen in consultation at the request of Dr. Nestor Lewandowsky for evaluation of rUTIs and incomplete bladder emptying.          Medical records personally reviewed and found significant for:  Visit with nephrology Dr. Caryl Ada provider on 09/14/21 notes reviewed and show: Patient was found to have reflux nephropathy at age 76 and was on peritoneal dialysis briefly before receiving a living related renal transplant from mother. She had some thrombotic complication and underwent nephrectomy in 1997. She started dialysis and had a second kidney transplant at age 48 in 56. 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 She received a 3rd kidney transplant in 11/2002 which failed in 2007 due to chronic rejection. She is s/p 4th kidney transplant 12/05/18 with native tissue disease nephropathy.   ??  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  Creatinine - value 1.25, stable -> 1.33 on 11/26/21. CrCl 85.24  She is on immunosuppression with Myfortic 720 mg BID / Prednisone 5 mg every other day and 2.5 mg every other day / Belatacept monthly.  She has hx of rUTIs. Previously referred to urogyn given risk of future antibiotic resistance  ??  She has decreased vision both eyes.    Urine cultures in Epic:  09/22/21 Mixed gram +/- bacteria  08/27/21 10-50K Enterococcus faecalis  07/30/21 10-50K Enterococcus faecalis  05/26/21  MUF  04/02/21 MUF  11/13/20 MUF  10/30/20  MUF  10/09/20 50-100K Proteus mirabilis, res to cefazolin, nitrofurantoin, tetracycline    01/25/21 Renal US:  TRANSPLANTED KIDNEY: The renal transplant was located in the left lower quadrant. Normal size and echogenicity.  No solid masses or calculi. No perinephric collections identified. Mild pelviectasis and/or trace hydronephrosis again identified. Failed atrophic right lower quadrant renal transplant is not well-visualized.  BLADDER: Unremarkable.  IMPRESSION:  --Stable resistive indices in the renal transplant arteries, with unchanged minimal elevation of the resistive indices in the mid/anastomotic main renal artery. Patent transplant vasculature.  --Surgically absent native kidneys.  --Mild pelviectasis and/or trace hydronephrosis, similar to prior.    Urinary Symptoms:  Stress urinary incontinence: {yes/no:19897}.   Urgency urinary incontinence: {yes/no:19897}.   Leaks *** time(s) per {days/weeks/months:6122}.   Pad use: {numbers 0-10:5044} {mini pad copy:63673} per day.    She {is/not:21341} bothered by her UI symptoms.    Nocturia: *** times per night to void.  Splinting or shifting to void: {yes/no:19897}     UTIs: {numbers 0-10:5044} UTI's in the last year.   She denies any history of kidney stones or hematuria.    Pelvic Organ Prolapse Symptoms:                   She {denies/admits to:25788} a feeling of a bulge the vaginal area.   She {denies/admits to:25788} seeing a bulge.   This bulge {is/not:21341} bothersome.    Bowel Symptom:  Bowel movements: *** time(s) per {days/weeks/months:6122}.    Stool consistency: ***.    She {denies/admits to:25788} accidental  bowel leakage / fecal incontinence.  Straining: {yes/no:19897}.   Splinting: {yes/no:19897}.   Incomplete evacuation: {yes/no:19897}.   Fecal urgency: {yes/no:19897}.     MEDICAL HISTORY:   Past OB/GYN History:  G{numbers 0-10:5044} P{numbers 0-10:5044}  Vaginal deliveries: {numbers 0-10:5044} Forceps delivery: {numbers 0-10:5044} Cesarean section: {numbers 0-10:5044}  Hysterectomy: {yes/no:19897}  She {is/not:21341} menopausal.    She {is/is not:19887} sexually active.   She {DOES /DOES NOT:25409:a} have dyspareunia.  Contraception: ***.  Last pap smear was ***.  History of abnormal pap smears: {yes/no:19897}.    Last colonoscopy was ***.     Past Medical History: Patient  has a past medical history of Bell's palsy, Disease of thyroid gland, ESRD (end stage renal disease) (CMS-HCC), Nonarteritic ischemic optic neuropathy, and Reflux nephropathy.     Past Surgical History: She  has a past surgical history that includes AV fistula placement (Left); Parathyroidectomy (Bilateral, 2010); Skin biopsy; Skin biopsy (Left); Nephrectomy (Bilateral); pr transplantation of kidney (Left, 12/05/2018); and pr transplant,prep cadaver renal graft (Left, 12/05/2018).     Social History: Patient  reports that she has never smoked. She has never used smokeless tobacco. She reports that she does not drink alcohol and does not use drugs. She lives with ***.  She {is:29657::is} employed ***. History of emotional, physical, and/or sexual abuse: {yes no:22180}.     Family History: family history includes Cancer in her maternal grandfather, maternal grandmother, paternal grandfather, and paternal grandmother; Diabetes in her father; Kidney disease in her brother and father.    Medications: She has a current medication list which includes the following prescription(s): acetaminophen, ascorbic acid (vitamin c), aspirin, calcitriol, carboxymethylcellulose sodium, chlorthalidone, ezetimibe, ferrous sulfate, mycophenolate, omega-3 acid ethyl esters, omeprazole, prednisone, and sertraline.    Allergies: Patient is allergic to ancef [cefazolin], adhesive tape-silicones, demerol [meperidine], and pravastatin.     ROS  Review of Systems: Positive for ***.  Otherwise a 10 system review of systems was negative with positives as noted in the HPI.    OBJECTIVE  Physical Exam:  There were no vitals taken for this visit.  Constitutional: Well-developed, well-nourished. No acute distress.  Psych: Normal mood and affect. Memory intact.    Eyes: Lids appear normal. Conjunctivae are not injected.   ENMT: External ears are normal. Hearing is grossly normal.   Respiratory: Normal respiratory effort, no acute respiratory distress. No audible wheeze.   Cardiovascular:  2+ DP pulses BLE. No lower extremity edema bilaterally.   Skin: Warm and dry. No visible rashes.   Gastrointestinal: Soft, non-distended. No abdominal tenderness. No rebound/guarding.   Musculoskeletal: Ambulatory ***, extremities warm and well perfused.    GU / Detailed Urogynecologic Evaluation:  Pelvic Exam: External genitalia normal without erythema or rash. Along its length, the urethra is palpably normal without tenderness, discharge, scarring, or diverticulum.   Urethral meatus normal; (***) CST, (***) VST, in supine position.    Speculum exam reveals vaginal tissues with {Mild/Mod/Sev/No:43464} atrophic changes.   *** Cervix normal. No CMT or abnormalities of the cervical os. Uterus normal, single, non-tender. Adnexa no masses, fullness, or tenderness.   *** Patient is s/p hysterectomy: Speculum exam reveals normal vaginal cuff. Adnexa no masses, fullness, or tenderness.      Pelvic floor tone {normal/increased/reduced:63726}, strength {Roman # I-V:19040}/V, puborectalis {Roman # I-V:19040}/V, external anal sphincter {Roman # I-V:19040}/V. Pelvic floor muscles relaxed and without spasm or tenderness. ***         View : No data to display.  Rectal Exam:   Normal sphincter tone, {no/small/moderate/large:25673} distal rectocele, {no/noted:25674} enterocoele, no rectal masses, {no sign of/noted:25675} dyssynergia when asking the patient to bear down.    Post-Void Residual (PVR) by Bladder Scan:  In order to evaluate bladder emptying, we discussed obtaining a postvoid residual and she agreed to this procedure.  Procedure: The ultrasound unit was placed on the patient???s abdomen in the suprapubic region after the patient had voided. A PVR of *** ml was obtained by bladder scan.    Laboratory Results:  Urine dipstick shows:   No results found for this visit on 12/13/21.      I visualized the urine specimen, noting the specimen to be {urine color:24444:a::1}     ASSESSMENT AND PLAN  Kimberly [name] is a 41 y.o. with: No diagnosis found.    Plan:  Recurrent UTIs;  -S/p 4th renal transplant on immunosuppression with most recent Scr of 1.33 on 11/26/21  -Increase water intake daily ***  -Urine dip today ***  -Message to nephrology re: starting ppx with nitrofurantoin 50 mg nightly    Incomplete bladder emptying:  -PVR today of ***  -Most recent renal US shows stable mild pelviectasis and/or trace hydronephrosis in transplanted kidney    -Patient will call the clinic or use MyChart should anything change or any new issues arise  -All questions answered    No follow-ups on file.    Mahala Menghini, MS, PA-C  [facility] [employer] present for exam: {uphchaperonename:94828}     Disclaimer: Please note that the above dictation was partly created using [employer]. Inaccuracies may be present due to transcription errors. For any questions or clarification of the above please contact the provider directly.    Medical Decision Making - Amount and Complexity of Data  1 point: I reviewed and/or ordered a clinical laboratory test.   1 point: I reviewed and/or ordered a radiologic study.  1 point: I reviewed and/or ordered a non-invasive diagnostic study.  1 point: I discussed a diagnostic study with the interpreting physician.   1 point: I decided to obtain the patient's outside medical records.  2 points: I independently visualized the image, tracing or specimen itself.  2 points: I have reviewed/summarized the patient's outside medical records OR I have obtained history during today's visit from someone other than the patient.

## 2021-12-13 ENCOUNTER — Ambulatory Visit
Admission: EM | Admit: 2021-12-13 | Discharge: 2021-12-13 | Disposition: A | Discharge status: Home / Self Care | Payer: Medicare Other

## 2021-12-13 DIAGNOSIS — J0110 Acute frontal sinusitis, unspecified: Secondary | ICD-10-CM

## 2021-12-13 MED ORDER — AMOXICILLIN-POT CLAVULANATE 875-125 MG PO TABS: 1.0000 | ORAL_TABLET | Freq: Two times a day (BID) | ORAL | 0 refills | Status: AC

## 2021-12-13 NOTE — ED Provider Notes (Signed)
[facility]  ____________________________________________  Time seen: Approximately 3:43 PM  I have reviewed the triage vital signs and the nursing notes.   HISTORY  Chief Complaint Fever and Nasal Congestion   Historian Patient    HPI Julie Camacho is a 41 y.o. female presents to the urgent care with maxillary sinus tenderness concern for sinusitis for the past 4 to 5 days.  She endorses low-grade fever at home.  States that she has had some nasal congestion and rhinorrhea but no cough.   Past Medical History:  Diagnosis Date   Blind left eye    Renal disorder    Thyroid disease      Immunizations up to date:  No.   Past Medical History:  Diagnosis Date   Blind left eye    Renal disorder    Thyroid disease     There are no problems to display for this patient.   Past Surgical History:  Procedure Laterality Date   AV FISTULA PLACEMENT     CESAREAN SECTION     KIDNEY TRANSPLANT     NEPHRECTOMY TRANSPLANTED ORGAN      Prior to Admission medications   Medication Sig Start Date End Date Taking? Authorizing Provider  amoxicillin-clavulanate (AUGMENTIN) 875-125 MG tablet Take 1 tablet by mouth 2 (two) times daily for 10 days. 12/13/21 12/23/21 Yes Vallarie Mare M, PA-C  ASPIRIN LOW DOSE 81 MG EC tablet  03/21/19  Yes [provider]  calcitRIOL (ROCALTROL) 0.5 MCG capsule Take 0.5 mcg by mouth every other day.   Yes [provider]  Carboxymethylcellulose Sod PF (THERATEARS) 1 % GEL Instill drops in affected eye(s) as directed as needed.   Yes [provider]  mycophenolate (CELLCEPT) 500 MG tablet Take 1,000 mg by mouth 2 (two) times daily. 11/22/21  Yes [provider]  Omega-3 Fatty Acids (FISH OIL) 1000 MG CAPS Take by mouth. 11/02/21 11/02/22 Yes [provider]  omeprazole (PRILOSEC OTC) 20 MG tablet Take 20 mg by mouth daily.   Yes [provider]  predniSONE (DELTASONE) 5 MG tablet  04/17/19   Yes [provider]  sertraline (ZOLOFT) 100 MG tablet Take 100 mg by mouth daily. 12/01/21  Yes [provider]  acetaminophen (TYLENOL) 500 MG tablet Take by mouth. 12/10/18   [provider]  cinacalcet (SENSIPAR) 60 MG tablet Take 60 mg by mouth daily.    [provider]  doxycycline (VIBRAMYCIN) 100 MG capsule Take 1 capsule (100 mg total) by mouth 2 (two) times daily. 10/12/18   Coral Spikes, DO  epoetin alfa (EPOGEN,PROCRIT) 16109 UNIT/ML injection Inject 20,000 Units into the skin 3 (three) times a week.    [provider]  heparin 6,000 Units in sodium chloride 0.9 % 500 mL Irrigate with 1 application as directed once.    [provider]  HYDROcodone-acetaminophen (NORCO/VICODIN) 5-325 MG tablet 1-2 tabs po qd prn 08/05/19   Norval Gable, MD  midodrine (PROAMATINE) 5 MG tablet Take 5 mg by mouth 3 (three) times daily with meals.    [provider]  sevelamer carbonate (RENVELA) 800 MG tablet Take 1,600 mg by mouth 3 (three) times daily with meals.    [provider]  sodium bicarbonate 650 MG tablet TAKE 1 TABLET (650 MG TOTAL) BY MOUTH TWO (2) TIMES A DAY. 05/20/19   [provider]  Thiamine HCl ([employer] VITAMIN B-1 PO) Take by mouth.    [provider]  valACYclovir (VALTREX) 1000 MG  tablet Take 1 tablet (1,000 mg total) by mouth 3 (three) times daily. 08/05/19   Norval Gable, MD    Allergies Cefazolin, Demerol [meperidine], Nsaids, Tolmetin, Cephalosporins, Other, Pravastatin, and Tape  No family history on file.  Social History Social History   Tobacco Use   Smoking status: Never   Smokeless tobacco: Never  Vaping Use   Vaping Use: Never used  Substance Use Topics   Alcohol use: No   Drug use: No     Review of Systems  Constitutional: No fever/chills Eyes:  No discharge ENT: Patient has facial pain and pressure.  Respiratory: no cough. No SOB/ use of accessory muscles to  breath Gastrointestinal:   No nausea, no vomiting.  No diarrhea.  No constipation. Musculoskeletal: Negative for musculoskeletal pain. Skin: Negative for rash, abrasions, lacerations, ecchymosis.   ____________________________________________   PHYSICAL EXAM:  VITAL SIGNS: ED Triage Vitals  Enc Vitals Group     BP 12/13/21 1447 (!) 127/102     Pulse Rate 12/13/21 1447 (!) 104     Resp 12/13/21 1447 20     Temp 12/13/21 1447 100.1 F (37.8 C)     Temp Source 12/13/21 1447 Oral     SpO2 12/13/21 1447 100 %     Weight 12/13/21 1447 210 lb (95.3 kg)     Height 12/13/21 1447 5\' 4"  (1.626 m)     Head Circumference --      Peak Flow --      Pain Score 12/13/21 1446 2     Pain Loc --      Pain Edu? --      Excl. in [place]? --      Constitutional: Alert and oriented. Well appearing and in no acute distress. Eyes: Conjunctivae are normal. PERRL. EOMI. Head: Atraumatic.  Patient has maxillary and facial sinus tenderness. ENT:      Nose: No congestion/rhinnorhea.      Mouth/Throat: Mucous membranes are moist.  Neck: No stridor.  No cervical spine tenderness to palpation. Cardiovascular: Normal rate, regular rhythm. Normal S1 and S2.  Good peripheral circulation. Respiratory: Normal respiratory effort without tachypnea or retractions. Lungs CTAB. Good air entry to the bases with no decreased or absent breath sounds Gastrointestinal: Bowel sounds x 4 quadrants. Soft and nontender to palpation. No guarding or rigidity. No distention. Musculoskeletal: Full range of motion to all extremities. No obvious deformities noted Neurologic:  Normal for age. No gross focal neurologic deficits are appreciated.  Skin:  Skin is warm, dry and intact. No rash noted. Psychiatric: Mood and affect are normal for age. Speech and behavior are normal.   ____________________________________________   LABS (all labs ordered are listed, but only abnormal results are displayed)  Labs Reviewed - No data to  display ____________________________________________  EKG   ____________________________________________  RADIOLOGY   No results found.  ____________________________________________    PROCEDURES  Procedure(s) performed:     Procedures     Medications - No data to display   ____________________________________________   INITIAL IMPRESSION / ASSESSMENT AND PLAN / ED COURSE  Pertinent labs & imaging results that were available during my care of the patient were reviewed by me and considered in my medical decision making (see chart for details).      Assessment and plan Sinusitis 41 year old female presents to the urgent care with maxillary and frontal sinus tenderness for the past week.  We will treat with Augmentin twice daily for the next 10 days.  Also recommended Zyrtec and  Flonase.     ____________________________________________  FINAL CLINICAL IMPRESSION(S) / ED DIAGNOSES  Final diagnoses:  Acute non-recurrent frontal sinusitis      NEW MEDICATIONS STARTED DURING THIS VISIT:  ED Discharge Orders          Ordered    amoxicillin-clavulanate (AUGMENTIN) 875-125 MG tablet  2 times daily        12/13/21 1516                This chart was dictated using voice recognition software/Dragon. Despite best efforts to proofread, errors can occur which can change the meaning. Any change was purely unintentional.     Lannie Fields, PA-C 12/13/21 1544

## 2021-12-13 NOTE — ED Triage Notes (Signed)
Patient is here with "Fever, Congestion, & Sinus pain/pressure". Last known Fever "2 pm, 101.9". Note: when rulling over last night felt like something popped in nose/cheek. "Feel's like a sinus infection". COVID19 @ home test "Negative".

## 2021-12-13 NOTE — Discharge Instructions (Signed)
Take Augmentin twice daily for the next 10 days Take 1 spray of Flonase each side.  Flonase is over-the-counter. Take 10 mg of Zyrtec before you go to bed.  Zyrtec is a nonsedating antihistamine and is also over-the-counter.

## 2021-12-15 NOTE — Unmapped (Signed)
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  2nd kidney transplant - 2000-2002 - DD failed chronic rejection.  3rd kidney transplant - 2004 - 2007 - DD failed in 2007 chronic rejection.    - Post-transplant course complicated by delayed graft function.     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    Creatinine - value 1.54, slightly up - will monitor.  Urine analysis: negative protein / WBC 5 / RBC 52 - monitor RBC in urine  Urine protein/creatinine ratio: 0.589 (12/17/21)  DSA: B7 (1463) - 05/26/21    Last CMV checked: not detected - 12/17/21  Last BKV checked: not detected - 05/26/21    2) Immunosuppression: Cellcept 1000 mg BID / Prednisone 5 mg every other day and 2.5 mg every other day / Belatacept monthly (last on 11/19/21)  - On belatacept due to biopsy findings.  - Changes in Immunosuppression - None today. Will continue current dose of prednisone for 6 more months, then will consider decreasing.  - Medications side effects: No    3) UTI: Asymptomatic today  - History of recent recurrent UTI's.   - Previously referred to urogyn given risk of future antibiotic resistance    4) Hypotension - Controlled - off midodrine now.  Goal for B.P > 100 systolic.   Changes in B.P medications - none.    5) Anemia - stable - Hgb 12.0  Goal for Hemoglobin > 11.0  Ferritin 14.6 (09/04/19)   - Continue iron supplement once daily.   - Vit C supplements     6) MBD - Calcium 10.2 / Phosphorus 2.7 - Secondary hyperparathyroidism  On calcitriol 0.25 mcg daily    7) Electrolytes: WNL - continue to monitor.    8) Leg swelling   - chlorthalidone 12.5 mg daily    9) Immunizations:  Influenza (inactivated only): received fall 2022  Pneumococcal vaccination (inactivated only): will ask patient  Covid-19 [employer]): 01/15/20, 02/05/20, 07/2020, 12/28/20. Evusheld antibody injection received 03/05/21.    10) Cancer screening:  PAP smear - 11/06/19 negative   Mammogram - Age 60. Due, pt will schedule.  Dermatology - Pt will schedule appointment for cancer screening - Due.    11) Left UE swelling - patient following up in lymphedema clinic every 6 months.     12) High Cholesterol - Pt was previously on pravastatin 20 mg daily, but d/t all over muscle twitching discontinued it three weeks later.  On Zetia 10 mg daily. Tolerating well.    13) Anxiety: improved  - Pt previously endorsed severe night sweats while on Celexa.  - Continue Zoloft 100mg  daily.    Follow up: 3 months.    History of Presenting Illness:   Since patient's last visit in the transplant clinic - patient has been doing well in terms of transplant, taking transplant medications regularly, no episodes of rejection and no side effects of medications.    Patient continues to receive belatacept infusions (  last on 11/19/21).    Presented to [facility] ED on 12/13/2021 with sinus infection. Given 10-day course of Augmentin BID.    Today patient presents feeling overall well. Sinus pain and pressure last Sunday, worsened on Monday with stabbing pain and fever. Went to ED and she was prescribed Augmentin but did not take any because she felt fine the next day. Symptoms resolved now. Next belatacept infusion at 2:15 pm today. Denies fever, cough, trouble passing urine.     Diabetes: No   HTN: Yes: Hypotension    Controlled:Yes   Adherence      With Medication: yes    With Follow up: yes    Functional Status: independent    Patient was found to have reflux nephropathy at age 63 and was on peritoneal dialysis briefly before receiving a living related renal transplant from mother. She had some thrombotic complication and underwent nephrectomy in 1997. She started dialysis and had a second kidney transplant at age 27 in 80. 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 She received a 3rd kidney transplant in 11/2002 which failed in 2007 due to chronic rejection.    She has decreased vision both eyes.    Review of Systems:     Fever or chills: negative  Sore throat: negative   Fatigue/malaise: negative   Weight loss or gain: negative   New skin rash/lump or bump: negative   Problems with teeth/gums: negative   Chest pain: negative   Cough or shortness of breath: negative   Swelling: negative   Abdominal pain/heartburn/nausea/vomiting or diarrhea: negative   Pain or bleeding when urinating: negative   Twitching/numbness or weakness: negative     Physical Exam:     BP 137/77 (BP Site: R Arm, BP Position: Sitting, BP Cuff Size: Large)  - Pulse 89  - Temp 36.1 ??C (96.9 ??F) (Temporal)  - Wt 97.1 kg (214 lb)  - BMI 36.73 kg/m??     Nursing note and vitals reviewed.   Constitutional: Oriented to person, place, and time. Appears well-developed and well-nourished. No distress.   HENT: Wearing mask   Head: Normocephalic and atraumatic.   Eyes: Right eye exhibits no discharge. Left eye exhibits no discharge. No scleral icterus.   Neck: Normal range of motion. Neck supple.   Cardiovascular: Normal rate and regular rhythm. Exam reveals no gallop and no friction rub. No murmur heard.   Pulmonary/Chest: Effort normal and breath sounds normal. No respiratory distress.   Abdominal: Soft. Non tender  Musculoskeletal: Normal range of motion. No edema and no tenderness.   Neurological: Alert and oriented to person, place, and time.       Renal Transplant History:    Race:  Caucasian   Age of recipient (at time of transplant): 41 y.o.   Cause of kidney disease: Reflux nephropathy   Native biopsy: No Date of transplant: 12/05/18   Type of transplant: KDPI - 39%    - Donor creatinine: 1.3    - Any co morbidities: No    - Infection in donor: No    - HLA Mismatch: N/A    - Ischemia time: 6h 37m    - Crossmatch: negative    - Donor kidney biopsy: No diagnostic abnormalities identified (12/05/18)     Induction: [place] - alemtuzumab   Maintenance IS at the time of transplant: tacrolimus, mycophenolate   DGF: Yes   Diabetes Onset after transplant: No  Allergies:   Allergies   Allergen Reactions   ??? Ancef [Cefazolin] Shortness Of Breath   ??? Adhesive Tape-Silicones      Other reaction(s): Other (See Comments)  Uncoded Allergy. Allergen: plastic tape, Other Reaction: blistering   ??? Other      Other reaction(s): Other (See Comments)  Uncoded Allergy. Allergen: plastic tape, Other Reaction: blistering   ??? Demerol [Meperidine] Nausea And Vomiting   ??? Pravastatin      Other reaction(s): Other (See Comments)  Muscle Twitching        Current Medications:   Current Outpatient Medications   Medication Sig Dispense Refill   ??? acetaminophen (TYLENOL) 500 MG tablet Take 2 tablets (1,000 mg total) by mouth every six (6) hours as needed for pain. 100 tablet 0   ??? ascorbic acid, vitamin C, (VITAMIN C) 500 MG tablet Take 500 mg by mouth daily.     ??? aspirin (ECOTRIN) 81 MG tablet Take 1 tablet (81 mg total) by mouth daily. 30 tablet 0   ??? calcitrioL (ROCALTROL) 0.25 MCG capsule Take 1 capsule (0.25 mcg total) by mouth daily. 30 capsule 11   ??? carboxymethylcellulose sodium (REFRESH CELLUVISC) 1 % DpGe Instill drops in affected eye(s) as directed as needed.     ??? chlorthalidone (HYGROTON) 25 MG tablet TAKE 1 TABLET (25 MG TOTAL) BY MOUTH DAILY. 90 tablet 3   ??? ezetimibe (ZETIA) 10 mg tablet Take 1 tablet (10 mg total) by mouth daily. 90 tablet 3   ??? ferrous sulfate 325 (65 FE) MG tablet Take 325 mg by mouth daily.     ??? omega-3 acid ethyl esters (LOVAZA) 1 gram capsule Take 1 capsule (1 g total) by mouth Two (2) times a day. E78.1 hypertriglyceridemia 180 capsule 3   ??? omeprazole (PRILOSEC) 40 MG capsule Take 1 capsule (40 mg total) by mouth daily. 30 capsule 0   ??? predniSONE (DELTASONE) 2.5 MG tablet Take 2 tablets (5 mg total) by mouth daily. (Patient taking differently: Take 5 mg by mouth daily. Take 2.5mg  one day and 5mg  next day alternating.) 60 tablet 11   ??? sertraline (ZOLOFT) 100 MG tablet Take 1 tablet (100 mg total) by mouth daily. 90 tablet 3     No current facility-administered medications for this visit.       Past Medical History:   Past Medical History:   Diagnosis Date   ??? Bell's palsy    ??? Disease of thyroid gland    ??? ESRD (end stage renal disease) (CMS-HCC)    ??? Nonarteritic ischemic optic neuropathy    ??? Reflux nephropathy         Laboratory studies:     No results found for this or any previous visit (from the past 170 hour(s)).    Scribe's Attestation: Leeroy Bock, MD obtained and performed the history, physical exam and medical decision making elements that were entered into the chart.  Signed by Lauralee Evener, Scribe, on December 17, 2021 at 11:28 AM.    Documentation assistance provided by the Scribe. I was present during the time the encounter was recorded. The information recorded by the Scribe was done at my direction and has been reviewed and validated by me.

## 2021-12-17 ENCOUNTER — Ambulatory Visit: Admit: 2021-12-17 | Discharge: 2021-12-17 | Payer: MEDICARE

## 2021-12-17 ENCOUNTER — Ambulatory Visit: Admit: 2021-12-17 | Discharge: 2021-12-17 | Payer: MEDICARE | Attending: Nephrology | Primary: Nephrology

## 2021-12-17 DIAGNOSIS — Z940 Kidney transplant status: Principal | ICD-10-CM

## 2021-12-17 DIAGNOSIS — Z79899 Other long term (current) drug therapy: Principal | ICD-10-CM

## 2021-12-17 LAB — URINALYSIS WITH MICROSCOPY
BILIRUBIN UA: NEGATIVE
GLUCOSE UA: NEGATIVE
KETONES UA: NEGATIVE
LEUKOCYTE ESTERASE UA: NEGATIVE
NITRITE UA: NEGATIVE
PH UA: 6.5 (ref 5.0–9.0)
PROTEIN UA: NEGATIVE
RBC UA: 52 /HPF — ABNORMAL HIGH (ref <4)
SPECIFIC GRAVITY UA: 1.01 (ref 1.005–1.030)
SQUAMOUS EPITHELIAL: <1 /HPF (ref 0–5)
UROBILINOGEN UA: 0.2
WBC UA: 5 /HPF (ref 0–5)

## 2021-12-17 LAB — CBC W/ AUTO DIFF
BASOPHILS ABSOLUTE COUNT: 0.1 10*9/L (ref 0.0–0.1)
BASOPHILS RELATIVE PERCENT: 0.8 %
EOSINOPHILS ABSOLUTE COUNT: 0.1 10*9/L (ref 0.0–0.5)
EOSINOPHILS RELATIVE PERCENT: 1.2 %
HEMATOCRIT: 32.4 % — ABNORMAL LOW (ref 34.0–44.0)
HEMOGLOBIN: 10.8 g/dL — ABNORMAL LOW (ref 11.3–14.9)
LYMPHOCYTES ABSOLUTE COUNT: 1.2 10*9/L (ref 1.1–3.6)
LYMPHOCYTES RELATIVE PERCENT: 14.6 %
MEAN CORPUSCULAR HEMOGLOBIN CONC: 33.3 g/dL (ref 32.0–36.0)
MEAN CORPUSCULAR HEMOGLOBIN: 26 pg (ref 25.9–32.4)
MEAN CORPUSCULAR VOLUME: 78.1 fL (ref 77.6–95.7)
MEAN PLATELET VOLUME: 6.4 fL — ABNORMAL LOW (ref 6.8–10.7)
MONOCYTES ABSOLUTE COUNT: 0.5 10*9/L (ref 0.3–0.8)
MONOCYTES RELATIVE PERCENT: 6.8 %
NEUTROPHILS ABSOLUTE COUNT: 6.1 10*9/L (ref 1.8–7.8)
NEUTROPHILS RELATIVE PERCENT: 76.6 %
NUCLEATED RED BLOOD CELLS: 0 /100{WBCs} (ref <=4)
PLATELET COUNT: 381 10*9/L (ref 150–450)
RED BLOOD CELL COUNT: 4.15 10*12/L (ref 3.95–5.13)
RED CELL DISTRIBUTION WIDTH: 14.7 % (ref 12.2–15.2)
WBC ADJUSTED: 8 10*9/L (ref 3.6–11.2)

## 2021-12-17 LAB — COMPREHENSIVE METABOLIC PANEL
ALBUMIN: 3.9 g/dL (ref 3.4–5.0)
ALKALINE PHOSPHATASE: 83 U/L (ref 46–116)
ALT (SGPT): 36 U/L (ref 10–49)
ANION GAP: 9 mmol/L (ref 5–14)
AST (SGOT): 19 U/L (ref <=34)
BILIRUBIN TOTAL: 0.3 mg/dL (ref 0.3–1.2)
BLOOD UREA NITROGEN: 21 mg/dL (ref 9–23)
BUN / CREAT RATIO: 14
CALCIUM: 10.2 mg/dL (ref 8.7–10.4)
CHLORIDE: 103 mmol/L (ref 98–107)
CO2: 26.2 mmol/L (ref 20.0–31.0)
CREATININE: 1.54 mg/dL — ABNORMAL HIGH
EGFR CKD-EPI (2021) FEMALE: 43 mL/min/{1.73_m2} — ABNORMAL LOW (ref >=60)
GLUCOSE RANDOM: 89 mg/dL (ref 70–179)
POTASSIUM: 3.4 mmol/L (ref 3.4–4.8)
PROTEIN TOTAL: 7.5 g/dL (ref 5.7–8.2)
SODIUM: 138 mmol/L (ref 135–145)

## 2021-12-17 LAB — PHOSPHORUS: PHOSPHORUS: 2.7 mg/dL (ref 2.4–5.1)

## 2021-12-17 LAB — PROTEIN / CREATININE RATIO, URINE
CREATININE, URINE: 41.8 mg/dL
PROTEIN URINE: 24.6 mg/dL
PROTEIN/CREAT RATIO, URINE: 0.589

## 2021-12-17 LAB — MAGNESIUM: MAGNESIUM: 1.9 mg/dL (ref 1.6–2.6)

## 2021-12-17 MED ORDER — CALCITRIOL 0.25 MCG CAPSULE
ORAL_CAPSULE | Freq: Every day | ORAL | 11 refills | 30 days | Status: CP
Start: 2021-12-17 — End: 2022-12-17
  Filled 2021-12-21: qty 30, 30d supply, fill #0

## 2021-12-17 MED ORDER — PREDNISONE 2.5 MG TABLET
ORAL_TABLET | Freq: Every day | ORAL | 11 refills | 30 days | Status: CP
Start: 2021-12-17 — End: 2022-12-17
  Filled 2021-12-21: qty 60, 30d supply, fill #0

## 2021-12-17 MED ADMIN — belatacept (NULOJIX) 487.5 mg in sodium chloride (NS) 0.9 % 100 mL IVPB: 5 mg/kg | INTRAVENOUS | @ 20:00:00 | Stop: 2021-12-17

## 2021-12-17 NOTE — Unmapped (Signed)
Transplant Coordinator, Clinic Visit   Pt seen today by transplant nephrologist for follow up, reviewed medications and symptoms.     Assessment  BP:  133/77      Constitutional: Negative for fever, chills, night sweats, unintentional weight loss, loss of appetite.  HEAD: Negative for headache or dizziness  Eyes: Negative for visual changes.  ENT: Negative for sore throat.  Cardiovascular: Negative for chest pain, pressure, or palpitations  Respiratory: Negative for shortness of breath.  Gastrointestinal: Negative for abdominal pain, nausea, vomiting, diarrhea or constipation.  Genitourinary: Negative for dysuria, polyuria,  hematuria, urgency, hesitancy, or ordor  Musculoskeletal: +1 biilateral leg swelling; + 3 upper LE edema  Skin/Hair: Negative for rash, itching, or hair loss  Neurological: Negative for numbness, tingling or hand tremors.  Pain: 0/10  Belatacept infusion  COVID vaccine administered in clinic today.

## 2021-12-17 NOTE — Unmapped (Signed)
[facility]. Patient denies any new concerns or questions, denies any changes to her medical history, medications and allergies reviewed by Clinical research associate. 24G PIV placed in right anterior arm using light, patient tolerated well, labs obtained per standing orders, no premeds ordered. Call light at patient's side, pt instructed on how to use.     1513 Belatacept 487.5 mg in 100 ml started per provider order     1545 Belatacept complete.      Patient tolerated infusion with no ill effects, PIV flushed with normal saline per infusion protocol, PIV D/C'd, gauze and coban applied. Vitals stable and comparable to baseline. Patient discharged from Infusion Center in stable condition with no acute distress. Next infusion appointment made for four weeks.

## 2021-12-17 NOTE — Unmapped (Signed)
Administered [employer] as ordered onto right deltoid. Patient tolerated well without complications

## 2021-12-20 DIAGNOSIS — Z940 Kidney transplant status: Principal | ICD-10-CM

## 2021-12-20 LAB — CMV DNA, QUANTITATIVE, PCR: CMV VIRAL LD: NOT DETECTED

## 2021-12-20 MED ORDER — MYCOPHENOLATE MOFETIL 500 MG TABLET
ORAL_TABLET | Freq: Two times a day (BID) | ORAL | 11 refills | 30.00000 days | Status: CP
Start: 2021-12-20 — End: 2022-12-20
  Filled 2021-12-21: qty 120, 30d supply, fill #0

## 2021-12-20 NOTE — Unmapped (Signed)
[facility] Refill Coordination Note    Specialty Medication(s) to be Shipped:   Transplant: mycophenolate mofetil 500mg     Other medication(s) to be shipped: calcitriol 0.62mcg and prednisone 2.5mg      Kimberly [name], DOB: 21-Apr-1981  Phone: [phone] (home)       All above HIPAA information was verified with patient.     Was a Nurse, learning disability used for this call? No    Completed refill call assessment today to schedule patient's medication shipment from the [facility] [phone]).  All relevant notes have been reviewed.     Specialty medication(s) and dose(s) confirmed: Regimen is correct and unchanged.   Changes to medications: Kimberly [name] reports no changes at this time.  Changes to insurance: No  New side effects reported not previously addressed with a pharmacist or physician: None reported  Questions for the pharmacist: No    Confirmed patient received a Conservation officer, historic buildings and a Surveyor, mining with first shipment. The patient will receive a drug information handout for each medication shipped and additional FDA Medication Guides as required.       DISEASE/MEDICATION-SPECIFIC INFORMATION        N/A    SPECIALTY MEDICATION ADHERENCE     Medication Adherence    Patient reported X missed doses in the last month: 0  Specialty Medication: Mycophenolate  Patient is on additional specialty medications: No  Any gaps in refill history greater than 2 weeks in the last 3 months: no  Demonstrates understanding of importance of adherence: yes  Informant: patient  Reliability of informant: reliable  Confirmed plan for next specialty medication refill: delivery by pharmacy  Refills needed for supportive medications: not needed              Were doses missed due to medication being on hold? No    Mycophenolate 500 mg: 7 days of medicine on hand       REFERRAL TO PHARMACIST     Referral to the pharmacist: Not needed      [facility]     Shipping address confirmed in Epic.     Delivery Scheduled: Yes, Expected medication delivery date: 12/22/2021.  However, Rx request for refills was sent to the provider as there are none remaining.     Medication will be delivered via UPS to the prescription address in Epic WAM.    Kimberly [name]   [facility] Specialty Technician

## 2021-12-21 DIAGNOSIS — Z940 Kidney transplant status: Principal | ICD-10-CM

## 2022-01-12 ENCOUNTER — Ambulatory Visit: Admit: 2022-01-12 | Discharge: 2022-01-13 | Payer: MEDICARE

## 2022-01-12 DIAGNOSIS — Z940 Kidney transplant status: Principal | ICD-10-CM

## 2022-01-12 DIAGNOSIS — N390 Urinary tract infection, site not specified: Principal | ICD-10-CM

## 2022-01-12 NOTE — Unmapped (Addendum)
[facility]  New Patient Evaluation & Consultation    Referring Provider: Leeroy Bock, MD  PCP: Estevan Oaks, MD  Date of Service: 01/12/2022      SUBJECTIVE  Chief Complaint: Establish Care (UTI's incomplete bladder empty)    History of Present Illness: Kimberly [name] is a 41 y.o. White female seen in consultation at the request of Dr. Nestor Lewandowsky for evaluation of recurrent UTI.    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 She receives UCx as screening for transplant surveillance. Urine culture history seen below. She denies UTI symptoms with the past 2 positive cultures. The last time she experienced UTI sx was ~2 years ago - noted to have dysuria and pelvic pressure. She otherwise has no other pelvic floor complaints. She takes cranberry supplements for UTI prevention.     Review of records significant for:  From Nephrology note, Dr. Nestor Lewandowsky 12/17/21  Patient was found to have reflux nephropathy at age 48 and was on peritoneal dialysis briefly before receiving a living related renal transplant from mother. She had some thrombotic complication and underwent nephrectomy in 1997. She started dialysis and had a second kidney transplant at age 58 in 90. 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 She received a 3rd kidney transplant in 11/2002 which failed in 2007 due to chronic rejection.     She has decreased vision both eyes.    1) s/p 4th Kidney txp - 12/05/18 - native disease reflux nephropathy  1st kidney transplant - 1995 -1997 - LR failed due to thrombotic issues.  2nd kidney transplant - 2000-2002 - DD failed chronic rejection.  3rd kidney transplant - 2004 - 2007 - DD failed in 2007 chronic rejection.     - Post-transplant course complicated by delayed graft function.      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     Creatinine - value 1.54, slightly up - will monitor.  Urine analysis: negative protein / WBC 5 / RBC 52 - monitor RBC in urine  Urine protein/creatinine ratio: 0.589 (12/17/21)  DSA: B7 (1463) - 05/26/21     Last CMV checked: not detected - 12/17/21  Last BKV checked: not detected - 05/26/21     2) Immunosuppression: Cellcept 1000 mg BID / Prednisone 5 mg every other day and 2.5 mg every other day / Belatacept monthly (last on 11/19/21)  - On belatacept due to biopsy findings.  - Changes in Immunosuppression - None today. Will continue current dose of prednisone for 6 more months, then will consider decreasing.  - Medications side effects: No     3) UTI: Asymptomatic today  - History of recent recurrent UTI's.   - Previously referred to urogyn given risk of future antibiotic resistance      Lab Results   Component Value Date    Urine Culture, Comprehensive Mixed Urogenital Flora 12/17/2021    Urine Culture, Comprehensive (A) 09/22/2021     Mixed Gram Positive/Gram Negative Organisms Isolated    Urine Culture, Comprehensive 10,000 to 50,000 CFU/mL Enterococcus faecalis (A) 08/27/2021    Urine Culture, Comprehensive 10,000 to 50,000 CFU/mL Enterococcus faecalis (A) 07/30/2021    Urine Culture, Comprehensive Mixed Urogenital Flora 05/26/2021  Urine Culture, Comprehensive Mixed Urogenital Flora 04/02/2021    Urine Culture, Comprehensive Mixed Urogenital Flora 11/13/2020    Urine Culture, Comprehensive Mixed Urogenital Flora 10/30/2020    Urine Culture, Comprehensive 50,000 to 100,000 CFU/mL Proteus mirabilis (A) 10/09/2020    Urine Culture, Comprehensive Mixed Urogenital Flora 09/11/2020    Urine Culture, Comprehensive Mixed Urogenital Flora 09/11/2020    Urine Culture, Comprehensive Mixed Urogenital Flora 06/19/2020    Urine Culture, Comprehensive Mixed Urogenital Flora 05/06/2020    Urine Culture, Comprehensive NO GROWTH 05/06/2020    Urine Culture, Comprehensive NO GROWTH 01/31/2020    Urine Culture, Comprehensive Mixed Urogenital Flora 12/27/2019    Urine Culture, Comprehensive Mixed Urogenital Flora 12/11/2019    Urine Culture, Comprehensive NO GROWTH 11/29/2019    Urine Culture, Comprehensive NO GROWTH 11/01/2019    Urine Culture, Comprehensive NO GROWTH 08/30/2019    Urine Culture, Comprehensive NO GROWTH 08/20/2019    Urine Culture, Comprehensive Mixed Urogenital Flora 07/22/2019    Urine Culture, Comprehensive Mixed Urogenital Flora 07/08/2019    Urine Culture, Comprehensive >100,000 CFU/mL Klebsiella pneumoniae (A) 06/28/2019    Urine Culture, Comprehensive <10,000 CFU/mL 06/24/2019    Urine Culture, Comprehensive Mixed Urogenital Flora 06/17/2019    Urine Culture, Comprehensive NO GROWTH 06/10/2019    Urine Culture, Comprehensive 50,000 to 100,000 CFU/mL Klebsiella pneumoniae (A) 06/06/2019    Urine Culture, Comprehensive Mixed Urogenital Flora 05/27/2019    Urine Culture, Comprehensive >100,000 CFU/mL Klebsiella pneumoniae (A) 05/17/2019    Urine Culture, Comprehensive Mixed Urogenital Flora 12/21/2018    Urine Culture, Comprehensive Mixed Urogenital Flora 12/17/2018       Renal US 11/2020  IMPRESSION:  --Stable resistive indices in the renal transplant arteries, with unchanged minimal elevation of the resistive indices in the mid/anastomotic main renal artery. Patent transplant vasculature.  --Surgically absent native kidneys.  --Mild pelviectasis and/or trace hydronephrosis, similar to prior.    Her last Cr 1.54 (12/17/21) with CrCl 39ml/min    Urinary Symptoms:  Stress urinary incontinence: no. -  Urgency urinary incontinence: no. -   Leaks 0 time(s) per day(s).   Pad use: none.      Fluid Intake:  2L water, 12oz coffee, 0 tea, 0 soda/carbonated beverages, 0 alcohol, 0juice    Voiding Symptoms  Reports daytime frequency and/or urgency: q3-4hrs.    Nocturia: 1-2 times per night to void.    Prior Treatment: none    Urinary Tract Symptoms  UTIs: 2 UTI's in the last year.    She denies history of blood in the urine but has had prior workup  She denies any history of kidney stones.     Pelvic Organ Prolapse Symptoms:                   She denies a feeling of a bulge the vaginal area.   She denies seeing a bulge.     Bowel Symptom:  Bowel movements: 1 time(s) per day(s).    Stool consistency: soft.    Bowel regimen: none  She denies accidental bowel leakage / fecal incontinence  Straining: no.   Splinting: no.   Incomplete evacuation: no.   Fecal urgency: no.   Last colonoscopy was n/a.      Past Medical History: Patient  has a past medical history of Bell's palsy, Disease of thyroid gland, ESRD (end stage renal disease) (CMS-HCC), Hypertriglyceridemia, Lymphedema, Nonarteritic ischemic optic neuropathy, and Reflux nephropathy.     Past Surgical History: She  has a past surgical history that  includes AV fistula placement (Left); Parathyroidectomy (Bilateral, 2010); Skin biopsy; Skin biopsy (Left); Nephrectomy (Bilateral); pr transplantation of kidney (Left, 12/05/2018); and pr transplant,prep cadaver renal graft (Left, 12/05/2018).     Obstetric History:  OB History   Gravida Para Term Preterm AB Living   1         1   SAB IAB Ectopic Molar Multiple Live Births                    # Outcome Date GA Lbr Len/2nd Weight Sex Delivery Anes PTL Lv   1 Gravida                 She has had 1 cesarean deliveries.     Gynecologic History:  She is not menopausal.    She is sexually active. She denies pain with intercourse.  Last pap smear was 11/06/19 negative.  She does not have a history of abnormal Pap smears.       Medications: She has a current medication list which includes the following prescription(s): acetaminophen, ascorbic acid (vitamin c), calcitriol, carboxymethylcellulose sodium, chlorthalidone, cranberry, ezetimibe, ferrous sulfate, mycophenolate, omega-3 acid ethyl esters, omeprazole, prednisone, sertraline, and aspirin. Allergies: Patient is allergic to ancef [cefazolin], cephalosporins, adhesive tape-silicones, other, demerol [meperidine], and pravastatin.     Social History: Patient  reports that she has never smoked. She has never used smokeless tobacco. She reports that she does not drink alcohol and does not use drugs. . Occupation: not working outside the home.  Family History: family history includes Cancer in her maternal grandfather, maternal grandmother, paternal grandfather, and paternal grandmother; Diabetes in her father; Kidney disease in her brother and father.     Review of Systems: A 10 system review of systems was otherwise negative except as noted in the HPI.    OBJECTIVE  Physical Exam:  Vitals:    01/12/22 1545   BP: 146/90   Pulse: 79   Weight: 95.3 kg (210 lb)   Height: 162.6 cm (5' 4)       Medical chaperone present for exam: Vara Guardian, RN    Constitutional:  Well appearing female in no acute distress.    Neuro: Alert and oriented x 3.  Respiratory:  Normal respiratory effort.   Cardiovascular:  LUE lymphedema.    Abdomen:  Soft. NTTP. No rebound/guarding.   Skin:  Warm and dry. No rashes/lesions visible.   Lymphatic: LUE lymphedema.   Musculoskeletal: Ambulatory, moves all limbs without difficulty, extremities warm and well perfused  Psych:  Normal mood and affect.      GU / Detailed Urogynecologic Evaluation:   Pelvic Exam: Normal external female genitalia; Bartholin's and Skene's glands normal in appearance; Urethral meatus without urethral caruncle , otherwise normal in appearance; no urethral masses or discharge.   Speculum exam reveals: normal vaginal mucosa without atrophy. Cervix normal appearance. Uterus normal single, nontender. Adnexa no mass, fullness, tenderness.      Negative VST or CST        01/12/2022     4:15 PM   POP-Q Exams   gh 3   pb 3.5   tvl 10   aa -3   ba -3   ap -3   bp -3   c -8   d -10         MSK:    Pelvic floor strength III/V,    Pelvic Floor Muscle Pain Exam:  Right retropubic: 0/10, Right obturator: 0/10, Right levator ani: 0/10,  Left levator ani: 0/10, Left obturator 0/10, Left retropubic: 0/10    Neuro:  Sensations: Intact    Rectal Exam:   Anal verge:  Normal in appearance, without evidence of external hemorrhoids;    Post-Void Residual (PVR) by Bladder Scan:  In order to evaluate bladder emptying, we discussed obtaining a postvoid residual and she agreed to this procedure.    Procedure: The ultrasound unit was placed on the patient???s abdomen in the suprapubic region after the patient had voided. A PVR of 17 ml was obtained by bladder scan.    Laboratory Results:  Results for orders placed or performed in visit on 01/12/22   POCT Urinalysis Dipstick   Result Value Ref Range    Spec Gravity/POC 1.020 1.003 - 1.030    PH/POC 7.0 5.0 - 9.0    Leuk Esterase/POC Negative Negative    Nitrite/POC Negative Negative    Protein/POC 1+ (A) Negative    UA Glucose/POC Negative Negative    Ketones, POC Negative Negative    Bilirubin/POC Negative Negative    Blood/POC 2+ (A) Negative    Urobilinogen/POC 0.2 0.2 - 1.0 mg/dL        I visualized the urine specimen, noting the specimen to be urine color: clear yellow    ASSESSMENT AND PLAN  Kimberly [name] is a 41 y.o. 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       1. Recurrent asymptomatic bacteriuria  -She has no contributing factors such as prolapse, incomplete emptying (nl PVR), fecal incontinence, or atrophy on evaluation  -We discussed that, generally, we typically reserve testing for UTI for UTI symptoms and that asymptomatic bacteriuria is not typically treated. She does have special circumstances given renal transplantation and prior graft rejection. At this time, review of current evidence does not seem to suggest that addressing asymptomatic bacteruria significantly reduces risk of recurrent symptomatic UTI or graft rejection, and not treating asymptomatic bacteriuria does not seem to correlate treatment with a reduction in graft rejection, though more studies are needed. Increased use of antibiotics are associated with increased risk of resistance and side effects. In her case, we understand the sensitivity to bacteriuria as this is her 4th renal transplant.   -It would be reasonable to pursue non-antibiotic prophylaxis. As such, we made a plan to begin D-mannose 2000mg  daily supplementation  Given the organisms that grew in her last 2 positive UCx (Enterococcus)  -In light of prior UCx positive for Proteus, can also consider addition of Methenamine 1g BID or daily (depending on CrCl) if she develops refractory UTI or if UCx grows Proteus as D-mannose is not protective against Proteus  -We discussed the role of diagnostic testing such as cystoscopy and upper tract imaging.   -She undergoes yearly Renal US with last US showing mild pelivectasis. Will consider cystoscopy in the future if persistent or refractory UTI      All questions answered.  Follow up in 3 months or sooner via MyChart. If she is doing well, she can follow up yearly or as needed.  Seen and D/w Dr. Luna Fuse, MD   Urogynecology Fellow  [facility]    Medical Decision Making - Amount and Complexity of Data  1 point: I reviewed clinical laboratory tests. (ie CPT 8XXXX series)  1 point: I reviewed radiology tests. (ie CPT 7XXXX series)  2 points: I reviewed and  summarized the old records   2 points: I independently visualized the specimen .

## 2022-01-12 NOTE — Unmapped (Addendum)
For treatment of recurrent urinary tract infections, we discussed management of recurrent UTIs including prophylaxis with a daily low dose antibiotic, transvaginal estrogen therapy, D-mannose, and cranberry supplements.  We discussed the role of diagnostic testing such as cystoscopy and upper tract imaging.      We discussed that we do not routinely treat asymptomatic bacteria in the urine and typically reserve urine cultures for symptoms of UTI. However given your medical history, we made a plan to start D-mannose 2000mg  daily. You can purchase over the counter or from [employer].    We can consider an antiseptic called Methenamine Hippurate if you develop persistent UTIs. I will talk to your nephrologists about this option

## 2022-01-14 ENCOUNTER — Ambulatory Visit: Admit: 2022-01-14 | Discharge: 2022-01-15 | Payer: MEDICARE

## 2022-01-14 LAB — COMPREHENSIVE METABOLIC PANEL
ALBUMIN: 4.2 g/dL (ref 3.4–5.0)
ALKALINE PHOSPHATASE: 76 U/L (ref 46–116)
ALT (SGPT): 54 U/L — ABNORMAL HIGH (ref 10–49)
ANION GAP: 7 mmol/L (ref 5–14)
AST (SGOT): 32 U/L (ref <=34)
BILIRUBIN TOTAL: 0.4 mg/dL (ref 0.3–1.2)
BLOOD UREA NITROGEN: 16 mg/dL (ref 9–23)
BUN / CREAT RATIO: 12
CALCIUM: 10.5 mg/dL — ABNORMAL HIGH (ref 8.7–10.4)
CHLORIDE: 100 mmol/L (ref 98–107)
CO2: 28.8 mmol/L (ref 20.0–31.0)
CREATININE: 1.34 mg/dL — ABNORMAL HIGH
EGFR CKD-EPI (2021) FEMALE: 51 mL/min/{1.73_m2} — ABNORMAL LOW (ref >=60)
GLUCOSE RANDOM: 96 mg/dL (ref 70–179)
POTASSIUM: 3.1 mmol/L — ABNORMAL LOW (ref 3.4–4.8)
PROTEIN TOTAL: 7.3 g/dL (ref 5.7–8.2)
SODIUM: 136 mmol/L (ref 135–145)

## 2022-01-14 LAB — CBC W/ AUTO DIFF
BASOPHILS ABSOLUTE COUNT: 0.1 10*9/L (ref 0.0–0.1)
BASOPHILS RELATIVE PERCENT: 0.7 %
EOSINOPHILS ABSOLUTE COUNT: 0.1 10*9/L (ref 0.0–0.5)
EOSINOPHILS RELATIVE PERCENT: 0.8 %
HEMATOCRIT: 36.3 % (ref 34.0–44.0)
HEMOGLOBIN: 11.9 g/dL (ref 11.3–14.9)
LYMPHOCYTES ABSOLUTE COUNT: 1.5 10*9/L (ref 1.1–3.6)
LYMPHOCYTES RELATIVE PERCENT: 14.4 %
MEAN CORPUSCULAR HEMOGLOBIN CONC: 32.8 g/dL (ref 32.0–36.0)
MEAN CORPUSCULAR HEMOGLOBIN: 26.2 pg (ref 25.9–32.4)
MEAN CORPUSCULAR VOLUME: 79.8 fL (ref 77.6–95.7)
MEAN PLATELET VOLUME: 6.9 fL (ref 6.8–10.7)
MONOCYTES ABSOLUTE COUNT: 0.6 10*9/L (ref 0.3–0.8)
MONOCYTES RELATIVE PERCENT: 6.2 %
NEUTROPHILS ABSOLUTE COUNT: 8.1 10*9/L — ABNORMAL HIGH (ref 1.8–7.8)
NEUTROPHILS RELATIVE PERCENT: 77.9 %
PLATELET COUNT: 339 10*9/L (ref 150–450)
RED BLOOD CELL COUNT: 4.55 10*12/L (ref 3.95–5.13)
RED CELL DISTRIBUTION WIDTH: 15.5 % — ABNORMAL HIGH (ref 12.2–15.2)
WBC ADJUSTED: 10.4 10*9/L (ref 3.6–11.2)

## 2022-01-14 LAB — MAGNESIUM: MAGNESIUM: 1.6 mg/dL (ref 1.6–2.6)

## 2022-01-14 LAB — PHOSPHORUS: PHOSPHORUS: 2.3 mg/dL — ABNORMAL LOW (ref 2.4–5.1)

## 2022-01-14 MED ADMIN — belatacept (NULOJIX) 487.5 mg in sodium chloride (NS) 0.9 % 100 mL IVPB: 5 mg/kg | INTRAVENOUS | @ 19:00:00 | Stop: 2022-01-14

## 2022-01-14 NOTE — Unmapped (Signed)
[facility] Refill Coordination Note    Specialty Medication(s) to be Shipped:   Transplant: mycophenolate mofetil 500mg  and Prednisone 2.5mg   Other medication(s) to be shipped: Calcitriol 0.35mcg     Kimberly [name], DOB: 1981-08-27  Phone: [phone] (home)     All above HIPAA information was verified with patient.     Was a Nurse, learning disability used for this call? No    Completed refill call assessment today to schedule patient's medication shipment from the [facility] [phone]).  All relevant notes have been reviewed.     Specialty medication(s) and dose(s) confirmed: Regimen is correct and unchanged.   Changes to medications: Eleana reports no changes at this time.  Changes to insurance: No  New side effects reported not previously addressed with a pharmacist or physician: None reported  Questions for the pharmacist: No    Confirmed patient received a Conservation officer, historic buildings and a Surveyor, mining with first shipment. The patient will receive a drug information handout for each medication shipped and additional FDA Medication Guides as required.       DISEASE/MEDICATION-SPECIFIC INFORMATION        N/A    SPECIALTY MEDICATION ADHERENCE     Medication Adherence    Patient reported X missed doses in the last month: 0  Specialty Medication: Mycophenolate 500mg   Patient is on additional specialty medications: Yes  Additional Specialty Medications: Prednisone 2.5mg   Patient Reported Additional Medication X Missed Doses in the Last Month: 0  Patient is on more than two specialty medications: No  Informant: patient  Reliability of informant: reliable  Reasons for non-adherence: no problems identified        Were doses missed due to medication being on hold? No    Prednisone 2.5 mg: 10 days of medicine on hand   Mycophenolate 500 mg: 10 days of medicine on hand     REFERRAL TO PHARMACIST     Referral to the pharmacist: Not needed    [facility]     Shipping address confirmed in Epic. Delivery Scheduled: Yes, Expected medication delivery date: 01/21/2022.     Medication will be delivered via UPS to the prescription address in Epic WAM.    Shazia Mitchener P Wetzel [facility] [facility] Specialty Technician

## 2022-01-14 NOTE — Unmapped (Signed)
Pt to [facility] for infusion of Belatacept. Pt denies new complaints/ infections. IV placed in RAC. VSS  labs drawn .   1459 Belatacept 487.5 mg  1529 Belatacept complete   VSS- PIV removed per protocol. Discharged to home without issue.

## 2022-01-17 LAB — CMV DNA, QUANTITATIVE, PCR: CMV VIRAL LD: NOT DETECTED

## 2022-01-20 MED FILL — CALCITRIOL 0.25 MCG CAPSULE: ORAL | 30 days supply | Qty: 30 | Fill #1

## 2022-01-20 MED FILL — PREDNISONE 2.5 MG TABLET: ORAL | 40 days supply | Qty: 60 | Fill #1

## 2022-01-20 MED FILL — MYCOPHENOLATE MOFETIL 500 MG TABLET: ORAL | 30 days supply | Qty: 120 | Fill #1

## 2022-01-23 DIAGNOSIS — Z940 Kidney transplant status: Principal | ICD-10-CM

## 2022-01-23 DIAGNOSIS — Z79899 Other long term (current) drug therapy: Principal | ICD-10-CM

## 2022-02-11 ENCOUNTER — Ambulatory Visit: Admit: 2022-02-11 | Discharge: 2022-02-12 | Payer: MEDICARE

## 2022-02-11 LAB — COMPREHENSIVE METABOLIC PANEL
ALBUMIN: 4.2 g/dL (ref 3.4–5.0)
ALKALINE PHOSPHATASE: 83 U/L (ref 46–116)
ALT (SGPT): 46 U/L (ref 10–49)
ANION GAP: 9 mmol/L (ref 5–14)
AST (SGOT): 26 U/L (ref <=34)
BILIRUBIN TOTAL: 0.3 mg/dL (ref 0.3–1.2)
BLOOD UREA NITROGEN: 16 mg/dL (ref 9–23)
BUN / CREAT RATIO: 13
CALCIUM: 10.4 mg/dL (ref 8.7–10.4)
CHLORIDE: 100 mmol/L (ref 98–107)
CO2: 27.3 mmol/L (ref 20.0–31.0)
CREATININE: 1.25 mg/dL — ABNORMAL HIGH
EGFR CKD-EPI (2021) FEMALE: 56 mL/min/{1.73_m2} — ABNORMAL LOW (ref >=60)
GLUCOSE RANDOM: 82 mg/dL (ref 70–179)
POTASSIUM: 3.3 mmol/L — ABNORMAL LOW (ref 3.4–4.8)
PROTEIN TOTAL: 7.2 g/dL (ref 5.7–8.2)
SODIUM: 136 mmol/L (ref 135–145)

## 2022-02-11 LAB — CBC W/ AUTO DIFF
BASOPHILS ABSOLUTE COUNT: 0.1 10*9/L (ref 0.0–0.1)
BASOPHILS RELATIVE PERCENT: 1.3 %
EOSINOPHILS ABSOLUTE COUNT: 0.1 10*9/L (ref 0.0–0.5)
EOSINOPHILS RELATIVE PERCENT: 1.3 %
HEMATOCRIT: 37.5 % (ref 34.0–44.0)
HEMOGLOBIN: 12.2 g/dL (ref 11.3–14.9)
LYMPHOCYTES ABSOLUTE COUNT: 1.6 10*9/L (ref 1.1–3.6)
LYMPHOCYTES RELATIVE PERCENT: 18.2 %
MEAN CORPUSCULAR HEMOGLOBIN CONC: 32.5 g/dL (ref 32.0–36.0)
MEAN CORPUSCULAR HEMOGLOBIN: 25.5 pg — ABNORMAL LOW (ref 25.9–32.4)
MEAN CORPUSCULAR VOLUME: 78.3 fL (ref 77.6–95.7)
MEAN PLATELET VOLUME: 6.6 fL — ABNORMAL LOW (ref 6.8–10.7)
MONOCYTES ABSOLUTE COUNT: 0.6 10*9/L (ref 0.3–0.8)
MONOCYTES RELATIVE PERCENT: 6.9 %
NEUTROPHILS ABSOLUTE COUNT: 6.4 10*9/L (ref 1.8–7.8)
NEUTROPHILS RELATIVE PERCENT: 72.3 %
PLATELET COUNT: 347 10*9/L (ref 150–450)
RED BLOOD CELL COUNT: 4.78 10*12/L (ref 3.95–5.13)
RED CELL DISTRIBUTION WIDTH: 14.7 % (ref 12.2–15.2)
WBC ADJUSTED: 8.8 10*9/L (ref 3.6–11.2)

## 2022-02-11 LAB — URINALYSIS WITH MICROSCOPY
BACTERIA: NONE SEEN /HPF
BILIRUBIN UA: NEGATIVE
GLUCOSE UA: NEGATIVE
KETONES UA: NEGATIVE
LEUKOCYTE ESTERASE UA: NEGATIVE
NITRITE UA: NEGATIVE
PH UA: 6.5 (ref 5.0–9.0)
PROTEIN UA: NEGATIVE
RBC UA: 72 /HPF — ABNORMAL HIGH (ref <4)
SPECIFIC GRAVITY UA: <=1.005 (ref 1.005–1.030)
SQUAMOUS EPITHELIAL: 4 /HPF (ref 0–5)
TRANSITIONAL EPITHELIAL: 1 /HPF (ref 0–2)
UROBILINOGEN UA: 0.2
WBC UA: <1 /HPF (ref 0–5)

## 2022-02-11 LAB — MAGNESIUM: MAGNESIUM: 1.6 mg/dL (ref 1.6–2.6)

## 2022-02-11 LAB — PHOSPHORUS: PHOSPHORUS: 3 mg/dL (ref 2.4–5.1)

## 2022-02-11 MED ADMIN — belatacept (NULOJIX) 487.5 mg in sodium chloride (NS) 0.9 % 100 mL IVPB: 5 mg/kg | INTRAVENOUS | @ 19:00:00 | Stop: 2022-02-11

## 2022-02-11 NOTE — Unmapped (Signed)
Pt to [facility] for infusion of Belatacept. Pt denies new complaints/ infections. VSS  labs drawn .   1516 Belatacept 487.5 mg  1546 Belatacept complete   VSS- PIV removed per protocol. Discharged to home without issue.

## 2022-02-11 NOTE — Unmapped (Signed)
[facility] Clinical Assessment & Refill Coordination Note    Kimberly [name], DOB: 11/16/1980  Phone: [phone] (home)     All above HIPAA information was verified with patient.     Was a Nurse, learning disability used for this call? No    Specialty Medication(s):   Transplant: mycophenolate mofetil 500mg  and Prednisone 2.5mg      Current Outpatient Medications   Medication Sig Dispense Refill   ??? acetaminophen (TYLENOL) 500 MG tablet Take 2 tablets (1,000 mg total) by mouth every six (6) hours as needed for pain. 100 tablet 0   ??? ascorbic acid, vitamin C, (VITAMIN C) 500 MG tablet Take 1 tablet (500 mg total) by mouth daily.     ??? aspirin (ECOTRIN) 81 MG tablet Take 1 tablet (81 mg total) by mouth daily. 30 tablet 0   ??? calcitrioL (ROCALTROL) 0.25 MCG capsule Take 1 capsule (0.25 mcg total) by mouth daily. 30 capsule 11   ??? carboxymethylcellulose sodium (REFRESH CELLUVISC) 1 % DpGe Instill drops in affected eye(s) as directed as needed.     ??? chlorthalidone (HYGROTON) 25 MG tablet TAKE 1 TABLET (25 MG TOTAL) BY MOUTH DAILY. 90 tablet 3   ??? cranberry 500 mg cap Take 500 mg by mouth in the morning.     ??? ezetimibe (ZETIA) 10 mg tablet Take 1 tablet (10 mg total) by mouth daily. 90 tablet 3   ??? ferrous sulfate 325 (65 FE) MG tablet Take 1 tablet (325 mg total) by mouth in the morning.     ??? mycophenolate (CELLCEPT) 500 mg tablet Take 2 tablets (1,000 mg total) by mouth Two (2) times a day. 120 tablet 11   ??? omega-3 acid ethyl esters (LOVAZA) 1 gram capsule Take 1 capsule (1 g total) by mouth Two (2) times a day. E78.1 hypertriglyceridemia 180 capsule 3   ??? omeprazole (PRILOSEC) 40 MG capsule Take 1 capsule (40 mg total) by mouth daily. 30 capsule 0   ??? predniSONE (DELTASONE) 2.5 MG tablet Take 2 tablets (5 mg total) by mouth daily ALTERNATING with 1 tablet (2.5mg )daily 60 tablet 11   ??? sertraline (ZOLOFT) 100 MG tablet Take 1 tablet (100 mg total) by mouth daily. 90 tablet 3     No current facility-administered medications for this visit.        Changes to medications: Kristyne reports no changes at this time.    Allergies   Allergen Reactions   ??? Ancef [Cefazolin] Shortness Of Breath   ??? Cephalosporins Shortness Of Breath   ??? Adhesive Tape-Silicones      Other reaction(s): Other (See Comments)  Uncoded Allergy. Allergen: plastic tape, Other Reaction: blistering   ??? Other      Other reaction(s): Other (See Comments)  Uncoded Allergy. Allergen: plastic tape, Other Reaction: blistering   ??? Demerol [Meperidine] Nausea And Vomiting   ??? Pravastatin      Other reaction(s): Other (See Comments)  Muscle Twitching       Changes to allergies: No    SPECIALTY MEDICATION ADHERENCE     Mycophenolate 500mg   : 12 days of medicine on hand   Prednisone 2.5mg   : 16 days of medicine on hand       Medication Adherence    Patient reported X missed doses in the last month: 0  Specialty Medication: mycophenolate 500mg   Patient is on additional specialty medications: Yes  Additional Specialty Medications: Prednisone 2.5mg   Patient Reported Additional Medication X Missed Doses in the Last  Month: 0          Specialty medication(s) dose(s) confirmed: Regimen is correct and unchanged.     Are there any concerns with adherence? No    Adherence counseling provided? Not needed    CLINICAL MANAGEMENT AND INTERVENTION      Clinical Benefit Assessment:    Do you feel the medicine is effective or helping your condition? Yes    Clinical Benefit counseling provided? Not needed    Adverse Effects Assessment:    Are you experiencing any side effects? No    Are you experiencing difficulty administering your medicine? No    Quality of Life Assessment:         How many days over the past month did your transplant  keep you from your normal activities? For example, brushing your teeth or getting up in the morning. 0    Have you discussed this with your provider? Not needed    Acute Infection Status:    Acute infections noted within Epic:  No active infections  Patient reported infection: None    Therapy Appropriateness:    Is therapy appropriate and patient progressing towards therapeutic goals? Yes, therapy is appropriate and should be continued    DISEASE/MEDICATION-SPECIFIC INFORMATION      N/A    PATIENT SPECIFIC NEEDS     - Does the patient have any physical, cognitive, or cultural barriers? No    - Is the patient high risk? Yes, patient is taking a REMS drug. Medication is dispensed in compliance with REMS program    - Does the patient require a Care Management Plan? No           SHIPPING     Specialty Medication(s) to be Shipped:   n/a    Other medication(s) to be shipped: No additional medications requested for fill at this time     Changes to insurance: No    Delivery Scheduled: Patient declined refill at this time due to call set up for 1 week out per patient request to get all meds shipped together.     Medication will be delivered via n/a to the confirmed n/a address in [facility].    The patient will receive a drug information handout for each medication shipped and additional FDA Medication Guides as required.  Verified that patient has previously received a Conservation officer, historic buildings and a Surveyor, mining.    The patient or caregiver noted above participated in the development of this care plan and knows that they can request review of or adjustments to the care plan at any time.      All of the patient's questions and concerns have been addressed.    Thad Ranger   [facility] Specialty Pharmacist

## 2022-02-14 LAB — CMV DNA, QUANTITATIVE, PCR
CMV QUANT: <50 [IU]/mL — ABNORMAL HIGH (ref <0)
CMV VIRAL LD: DETECTED — AB

## 2022-02-18 NOTE — Unmapped (Signed)
[facility] Refill Coordination Note    Specialty Medication(s) to be Shipped:   Transplant: mycophenolate mofetil 500mg  and Prednisone 2.5mg     Other medication(s) to be shipped: calcitriol     Kimberly [name], DOB: 06/01/1981  Phone: [phone] (home)       All above HIPAA information was verified with patient.     Was a Nurse, learning disability used for this call? No    Completed refill call assessment today to schedule patient's medication shipment from the [facility] [phone]).  All relevant notes have been reviewed.     Specialty medication(s) and dose(s) confirmed: Regimen is correct and unchanged.   Changes to medications: [name] reports no changes at this time.  Changes to insurance: No  New side effects reported not previously addressed with a pharmacist or physician: None reported  Questions for the pharmacist: No    Confirmed patient received a Conservation officer, historic buildings and a Surveyor, mining with first shipment. The patient will receive a drug information handout for each medication shipped and additional FDA Medication Guides as required.       DISEASE/MEDICATION-SPECIFIC INFORMATION        N/A    SPECIALTY MEDICATION ADHERENCE     Medication Adherence    Patient reported X missed doses in the last month: 0  Specialty Medication: mycophenolate 500mg   Patient is on additional specialty medications: Yes  Additional Specialty Medications: Prednisone 2.5mg   Patient Reported Additional Medication X Missed Doses in the Last Month: 0              Were doses missed due to medication being on hold? No    Mycophenolate 500mg   : 10 days of medicine on hand   Prednisone 2.5mg   : 10 days of medicine on hand       REFERRAL TO PHARMACIST     Referral to the pharmacist: Not needed      [facility]     Shipping address confirmed in Epic.     Delivery Scheduled: Yes, Expected medication delivery date: 02/24/2022.     Medication will be delivered via UPS to the prescription address in Epic WAM.    Thad Ranger   [facility] Specialty Pharmacist

## 2022-02-23 MED FILL — PREDNISONE 2.5 MG TABLET: ORAL | 40 days supply | Qty: 60 | Fill #2

## 2022-02-23 MED FILL — MYCOPHENOLATE MOFETIL 500 MG TABLET: ORAL | 30 days supply | Qty: 120 | Fill #2

## 2022-02-23 MED FILL — CALCITRIOL 0.25 MCG CAPSULE: ORAL | 30 days supply | Qty: 30 | Fill #2

## 2022-02-27 DIAGNOSIS — Z940 Kidney transplant status: Principal | ICD-10-CM

## 2022-03-02 NOTE — Unmapped (Signed)
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  2nd kidney transplant - 2000-2002 - DD failed chronic rejection.  3rd kidney transplant - 2004 - 2007 - DD failed in 2007 chronic rejection.    - Post-transplant course complicated by delayed graft function.     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    Creatinine - value 1.25, slightly up - will monitor.  Urine analysis: negative protein / WBC 6 / RBC 15 - monitor RBC in urine  Urine protein/creatinine ratio: 0.434   DSA: B7 (1463) - 05/26/21    Last CMV checked: <50 - 02/11/22  Last BKV checked: not detected - 05/26/21    2) Immunosuppression: Cellcept 1000 mg BID / Prednisone 5 mg every other day and 2.5 mg every other day / Belatacept monthly (last on 11/19/21)  - On belatacept due to biopsy findings.  - Changes in Immunosuppression - None today. Will continue current dose of prednisone for 6 more months, then will consider decreasing.  - Medications side effects: No    3) UTI: Asymptomatic today  - History of recent recurrent UTI's.   - Previously referred to urogyn given risk of future antibiotic resistance    4) Hypotension - Controlled - off midodrine now.  Goal for B.P > 100 systolic.   Changes in B.P medications - none.    5) Anemia - stable - Hgb 12.2  Goal for Hemoglobin > 11.0  Ferritin 14.6 (09/04/19)   - Continue iron supplement once daily.   - Vit C supplements     6) MBD - Calcium 10.4 / Phosphorus 3.0 - Secondary hyperparathyroidism  On calcitriol 0.25 mcg daily    7) Electrolytes: WNL - continue to monitor.    8) Leg swelling   - chlorthalidone 12.5 mg daily    9) Immunizations:  Influenza (inactivated only): received fall 2022  Pneumococcal vaccination (inactivated only): will ask patient  Covid-19 [employer]): 01/15/20, 02/05/20, 07/2020, 12/28/20. Evusheld antibody injection received 03/05/21.    10) Cancer screening:  PAP smear - 11/06/19 negative   Mammogram - Age 46. Due, pt will schedule.  Dermatology - Pt will schedule appointment for cancer screening - Due.    11) Left UE swelling - patient following up in lymphedema clinic every 6 months.     12) High Cholesterol - Pt was previously on pravastatin 20 mg daily, but d/t all over muscle twitching discontinued it three weeks later.  On Zetia 10 mg daily. Tolerating well.    13) Anxiety: improved  - Pt previously endorsed severe night sweats while on Celexa.  - Continue Zoloft 100mg  daily.    Follow up: 3-4 months.    History of Presenting Illness:   Since patient's last visit in the transplant clinic - patient has been doing well in terms of transplant, taking transplant medications regularly, no episodes of rejection and no side effects of medications.    Patient continues to receive belatacept infusions (last  on 02/11/22).    Today patient presents feeling overall well. She reports a 2 week history of breakthrough acid reflux symptoms, however feels better now = will schedule her for endoscopy considering her long h/o I.S. Her BP at home is typically in the 120's.      Diabetes: No   HTN: Yes: Hypotension    Controlled:Yes   Adherence      With Medication: yes    With Follow up: yes    Functional Status: independent    Patient was found to have reflux nephropathy at age 73 and was on peritoneal dialysis briefly before receiving a living related renal transplant from mother. She had some thrombotic complication and underwent nephrectomy in 1997. She started dialysis and had a second kidney transplant at age 77 in 21. 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 She received a 3rd kidney transplant in 11/2002 which failed in 2007 due to chronic rejection.    She has decreased vision both eyes.    Review of Systems:     Fever or chills: negative  Sore throat: negative   Fatigue/malaise: negative   Weight loss or gain: negative   New skin rash/lump or bump: negative   Problems with teeth/gums: negative   Chest pain: negative   Cough or shortness of breath: negative   Swelling: negative   Abdominal pain/heartburn/nausea/vomiting or diarrhea: negative   Pain or bleeding when urinating: negative   Twitching/numbness or weakness: negative     Physical Exam:     BP 176/91 (BP Site: R Wrist, BP Position: Sitting, BP Cuff Size: Small)  - Pulse 85  - Temp 35.9 ??C (96.7 ??F) (Temporal)  - Ht 162.6 cm (5' 4)  - Wt 97.1 kg (214 lb)  - LMP 02/22/2022 (Approximate)  - BMI 36.73 kg/m??     Nursing note and vitals reviewed.   Constitutional: Oriented to person, place, and time. Appears well-developed and well-nourished. No distress.   HENT: Wearing mask   Head: Normocephalic and atraumatic.   Eyes: Right eye exhibits no discharge. Left eye exhibits no discharge. No scleral icterus.   Neck: Normal range of motion. Neck supple.   Cardiovascular: Normal rate and regular rhythm. Exam reveals no gallop and no friction rub. No murmur heard.   Pulmonary/Chest: Effort normal and breath sounds normal. No respiratory distress.   Abdominal: Soft. Non tender  Musculoskeletal: Normal range of motion. No edema and no tenderness.   Neurological: Alert and oriented to person, place, and time.       Renal Transplant History:    Race:  Caucasian   Age of recipient (at time of transplant): 41 y.o.   Cause of kidney disease: Reflux nephropathy   Native biopsy: No    Date of transplant: 12/05/18   Type of transplant: KDPI - 39%    - Donor creatinine: 1.3    - Any co morbidities: No    - Infection in donor: No    - HLA Mismatch: N/A    - Ischemia time: 6h 78m    - Crossmatch: negative    - Donor kidney biopsy: No diagnostic abnormalities identified (12/05/18)     Induction: [place] - alemtuzumab   Maintenance IS at the time of transplant: tacrolimus, mycophenolate   DGF: Yes   Diabetes Onset after transplant: No    Allergies:   Allergies   Allergen Reactions    Ancef [Cefazolin] Shortness Of Breath  Cephalosporins Shortness Of Breath    Adhesive Tape-Silicones      Other reaction(s): Other (See Comments)  Uncoded Allergy. Allergen: plastic tape, Other Reaction: blistering    Other      Other reaction(s): Other (See Comments)  Uncoded Allergy. Allergen: plastic tape, Other Reaction: blistering    Demerol [Meperidine] Nausea And Vomiting    Pravastatin      Other reaction(s): Other (See Comments)  Muscle Twitching        Current Medications:   Current Outpatient Medications   Medication Sig Dispense Refill    acetaminophen (TYLENOL) 500 MG tablet Take 2 tablets (1,000 mg total) by mouth every six (6) hours as needed for pain. 100 tablet 0    ascorbic acid, vitamin C, (VITAMIN C) 500 MG tablet Take 1 tablet (500 mg total) by mouth daily.      aspirin (ECOTRIN) 81 MG tablet Take 1 tablet (81 mg total) by mouth daily. 30 tablet 0    calcitrioL (ROCALTROL) 0.25 MCG capsule Take 1 capsule (0.25 mcg total) by mouth daily. 30 capsule 11    carboxymethylcellulose sodium (REFRESH CELLUVISC) 1 % DpGe Instill drops in affected eye(s) as directed as needed.      chlorthalidone (HYGROTON) 25 MG tablet TAKE 1 TABLET (25 MG TOTAL) BY MOUTH DAILY. 90 tablet 3    cranberry 500 mg cap Take 500 mg by mouth in the morning.      cranberry-vitamin C-mannose 250-30-50 mg Chew Chew 1 tablet  in the morning. 90 tablet 1    ezetimibe (ZETIA) 10 mg tablet Take 1 tablet (10 mg total) by mouth daily. 90 tablet 3    ferrous sulfate 325 (65 FE) MG tablet Take 1 tablet (325 mg total) by mouth in the morning.      mycophenolate (CELLCEPT) 500 mg tablet Take 2 tablets (1,000 mg total) by mouth Two (2) times a day. 120 tablet 11    omega-3 acid ethyl esters (LOVAZA) 1 gram capsule Take 1 capsule (1 g total) by mouth Two (2) times a day. E78.1 hypertriglyceridemia 180 capsule 3    omeprazole (PRILOSEC) 40 MG capsule Take 1 capsule (40 mg total) by mouth daily. 30 capsule 0    predniSONE (DELTASONE) 2.5 MG tablet Take 2 tablets (5 mg total) by mouth daily ALTERNATING with 1 tablet (2.5mg )daily 60 tablet 11    sertraline (ZOLOFT) 100 MG tablet Take 1 tablet (100 mg total) by mouth daily. 90 tablet 3     No current facility-administered medications for this visit.       Past Medical History:   Past Medical History:   Diagnosis Date    Bell's palsy     Disease of thyroid gland     ESRD (end stage renal disease) (CMS-HCC)     Hypertriglyceridemia     Lymphedema     left arm    Nonarteritic ischemic optic neuropathy     Reflux nephropathy         Laboratory studies:     No results found for this or any previous visit (from the past 170 hour(s)).    Scribe's Attestation: Leeroy Bock, MD obtained and performed the history, physical exam and medical decision making elements that were entered into the chart.  Signed by Allie Dimmer, Scribe, on Mar 04, 2022 at 9:24 AM.    Documentation assistance provided by the Scribe. I was present during the time the encounter was recorded. The information recorded by the Scribe was done at my  direction and has been reviewed and validated by me.

## 2022-03-04 ENCOUNTER — Ambulatory Visit: Admit: 2022-03-04 | Discharge: 2022-03-05 | Payer: MEDICARE | Attending: Nephrology | Primary: Nephrology

## 2022-03-04 DIAGNOSIS — Z940 Kidney transplant status: Principal | ICD-10-CM

## 2022-03-04 DIAGNOSIS — N189 Chronic kidney disease, unspecified: Principal | ICD-10-CM

## 2022-03-04 DIAGNOSIS — D631 Anemia in chronic kidney disease: Principal | ICD-10-CM

## 2022-03-04 DIAGNOSIS — Z79899 Other long term (current) drug therapy: Principal | ICD-10-CM

## 2022-03-04 LAB — PROTEIN / CREATININE RATIO, URINE
CREATININE, URINE: 70.8 mg/dL
PROTEIN URINE: 30.7 mg/dL
PROTEIN/CREAT RATIO, URINE: 0.434

## 2022-03-04 LAB — URINALYSIS
BILIRUBIN UA: NEGATIVE
GLUCOSE UA: NEGATIVE
KETONES UA: NEGATIVE
LEUKOCYTE ESTERASE UA: NEGATIVE
NITRITE UA: NEGATIVE
PH UA: 6.5 (ref 5.0–9.0)
PROTEIN UA: NEGATIVE
RBC UA: 15 /HPF — ABNORMAL HIGH (ref <4)
SPECIFIC GRAVITY UA: 1.01 (ref 1.005–1.030)
SQUAMOUS EPITHELIAL: 6 /HPF — ABNORMAL HIGH (ref 0–5)
UROBILINOGEN UA: 0.2
WBC UA: 6 /HPF — ABNORMAL HIGH (ref 0–5)

## 2022-03-04 NOTE — Unmapped (Signed)
Assessment    Met w/ patient in  Clinic today. Reviewed meds/symptoms. Any new medications?               Pt reports being well rested and getting adequate exercise.    Continues to follow Covid/health safety precautions by taking care to mask, perform frequent hand hygeine and minimal public activity. Offered support and guidance for this process given their immune-suppressed state. We discussed reduced covid vaccine coverage for transplant patients and importance of continuing to mask and practice safe distancing. Commented that booster vaccines will likely be advised as an ongoing process.

## 2022-03-08 DIAGNOSIS — Z79899 Other long term (current) drug therapy: Principal | ICD-10-CM

## 2022-03-08 DIAGNOSIS — Z940 Kidney transplant status: Principal | ICD-10-CM

## 2022-03-11 ENCOUNTER — Ambulatory Visit: Admit: 2022-03-11 | Discharge: 2022-03-12 | Payer: MEDICARE

## 2022-03-11 LAB — COMPREHENSIVE METABOLIC PANEL
ALBUMIN: 4.2 g/dL (ref 3.4–5.0)
ALKALINE PHOSPHATASE: 84 U/L (ref 46–116)
ALT (SGPT): 52 U/L — ABNORMAL HIGH (ref 10–49)
ANION GAP: 10 mmol/L (ref 5–14)
AST (SGOT): 31 U/L (ref <=34)
BILIRUBIN TOTAL: 0.4 mg/dL (ref 0.3–1.2)
BLOOD UREA NITROGEN: 18 mg/dL (ref 9–23)
BUN / CREAT RATIO: 14
CALCIUM: 10.7 mg/dL — ABNORMAL HIGH (ref 8.7–10.4)
CHLORIDE: 101 mmol/L (ref 98–107)
CO2: 26.8 mmol/L (ref 20.0–31.0)
CREATININE: 1.32 mg/dL — ABNORMAL HIGH
EGFR CKD-EPI (2021) FEMALE: 52 mL/min/{1.73_m2} — ABNORMAL LOW (ref >=60)
GLUCOSE RANDOM: 85 mg/dL (ref 70–179)
POTASSIUM: 3.4 mmol/L (ref 3.4–4.8)
PROTEIN TOTAL: 7.1 g/dL (ref 5.7–8.2)
SODIUM: 138 mmol/L (ref 135–145)

## 2022-03-11 LAB — CBC W/ AUTO DIFF
BASOPHILS ABSOLUTE COUNT: 0.1 10*9/L (ref 0.0–0.1)
BASOPHILS RELATIVE PERCENT: 0.8 %
EOSINOPHILS ABSOLUTE COUNT: 0.1 10*9/L (ref 0.0–0.5)
EOSINOPHILS RELATIVE PERCENT: 0.8 %
HEMATOCRIT: 36 % (ref 34.0–44.0)
HEMOGLOBIN: 11.6 g/dL (ref 11.3–14.9)
LYMPHOCYTES ABSOLUTE COUNT: 1.3 10*9/L (ref 1.1–3.6)
LYMPHOCYTES RELATIVE PERCENT: 14.2 %
MEAN CORPUSCULAR HEMOGLOBIN CONC: 32.3 g/dL (ref 32.0–36.0)
MEAN CORPUSCULAR HEMOGLOBIN: 25.1 pg — ABNORMAL LOW (ref 25.9–32.4)
MEAN CORPUSCULAR VOLUME: 77.5 fL — ABNORMAL LOW (ref 77.6–95.7)
MEAN PLATELET VOLUME: 7.2 fL (ref 6.8–10.7)
MONOCYTES ABSOLUTE COUNT: 0.6 10*9/L (ref 0.3–0.8)
MONOCYTES RELATIVE PERCENT: 6.3 %
NEUTROPHILS ABSOLUTE COUNT: 7.4 10*9/L (ref 1.8–7.8)
NEUTROPHILS RELATIVE PERCENT: 77.9 %
PLATELET COUNT: 336 10*9/L (ref 150–450)
RED BLOOD CELL COUNT: 4.64 10*12/L (ref 3.95–5.13)
RED CELL DISTRIBUTION WIDTH: 14.8 % (ref 12.2–15.2)
WBC ADJUSTED: 9.5 10*9/L (ref 3.6–11.2)

## 2022-03-11 LAB — MAGNESIUM: MAGNESIUM: 1.8 mg/dL (ref 1.6–2.6)

## 2022-03-11 LAB — PHOSPHORUS: PHOSPHORUS: 2.5 mg/dL (ref 2.4–5.1)

## 2022-03-11 MED ADMIN — belatacept (NULOJIX) 487.5 mg in sodium chloride (NS) 0.9 % 100 mL IVPB: 5 mg/kg | INTRAVENOUS | @ 19:00:00 | Stop: 2022-03-11

## 2022-03-11 NOTE — Unmapped (Signed)
[facility]. Patient denies any new concerns or questions, denies any changes to her medical history, medications and allergies reviewed by Clinical research associate. 24G PIV placed in RAC, patient tolerated well,no premeds ordered. Call light at patient's side, pt instructed on how to use.     1452 Belatacept 487.5mg  in 100 ml started per provider order     1523 Belatacept complete.      Patient tolerated infusion with no ill effects, PIV flushed with normal saline per infusion protocol, PIV D/C'd, gauze and coban applied. Vitals stable and comparable to baseline. Patient discharged from Infusion Center in stable condition with no acute distress.

## 2022-03-14 LAB — CMV DNA, QUANTITATIVE, PCR
CMV QUANT: <50 [IU]/mL — ABNORMAL HIGH (ref <0)
CMV VIRAL LD: DETECTED — AB

## 2022-03-22 NOTE — Unmapped (Signed)
[facility] Refill Coordination Note    Specialty Medication(s) to be Shipped:   Transplant: mycophenolate mofetil 500mg     Other medication(s) to be shipped: calcitriol   Pt denied prednisone due to supply on hand, call set up for next week     Kimberly [name], DOB: 1981/04/26  Phone: [phone] (home)       All above HIPAA information was verified with patient.     Was a Nurse, learning disability used for this call? No    Completed refill call assessment today to schedule patient's medication shipment from the [facility] [phone]).  All relevant notes have been reviewed.     Specialty medication(s) and dose(s) confirmed: Regimen is correct and unchanged.   Changes to medications: Kimberly [name] reports no changes at this time.  Changes to insurance: No  New side effects reported not previously addressed with a pharmacist or physician: None reported  Questions for the pharmacist: No    Confirmed patient received a Conservation officer, historic buildings and a Surveyor, mining with first shipment. The patient will receive a drug information handout for each medication shipped and additional FDA Medication Guides as required.       DISEASE/MEDICATION-SPECIFIC INFORMATION        N/A    SPECIALTY MEDICATION ADHERENCE     Medication Adherence    Patient reported X missed doses in the last month: 0  Specialty Medication: Mycophenolate 500mg   Patient is on additional specialty medications: Yes  Additional Specialty Medications: Prednisone 2.5mg   Patient Reported Additional Medication X Missed Doses in the Last Month: 0  Patient is on more than two specialty medications: No              Were doses missed due to medication being on hold? No    Mycophenolate 500mg   : 7 days of medicine on hand   prednisone 2.5mg   : 20 days of medicine on hand       REFERRAL TO PHARMACIST     Referral to the pharmacist: Not needed      [facility]     Shipping address confirmed in Epic.     Delivery Scheduled: Yes, Expected medication delivery date: 03/28/2022.     Medication will be delivered via UPS to the prescription address in Epic WAM.    Kimberly [name]   [facility] Specialty Pharmacist

## 2022-03-25 MED FILL — CALCITRIOL 0.25 MCG CAPSULE: ORAL | 30 days supply | Qty: 30 | Fill #3

## 2022-03-25 MED FILL — MYCOPHENOLATE MOFETIL 500 MG TABLET: ORAL | 30 days supply | Qty: 120 | Fill #3

## 2022-03-30 NOTE — Unmapped (Signed)
[facility] Refill Coordination Note    Specialty Medication(s) to be Shipped:   Transplant: Prednisone 2.5mg     Other medication(s) to be shipped: No additional medications requested for fill at this time     CHE BELOW, DOB: 1981-01-10  Phone: [phone] (home)       All above HIPAA information was verified with patient.     Was a Nurse, learning disability used for this call? No    Completed refill call assessment today to schedule patient's medication shipment from the [facility] [phone]).  All relevant notes have been reviewed.     Specialty medication(s) and dose(s) confirmed: Regimen is correct and unchanged.   Changes to medications: Charlane reports no changes at this time.  Changes to insurance: No  New side effects reported not previously addressed with a pharmacist or physician: None reported  Questions for the pharmacist: No    Confirmed patient received a Conservation officer, historic buildings and a Surveyor, mining with first shipment. The patient will receive a drug information handout for each medication shipped and additional FDA Medication Guides as required.       DISEASE/MEDICATION-SPECIFIC INFORMATION        N/A    SPECIALTY MEDICATION ADHERENCE     Medication Adherence    Patient reported X missed doses in the last month: 0  Specialty Medication: prednisone 2.5mg   Patient is on additional specialty medications: No              Were doses missed due to medication being on hold? No    Prednisone 2.5 mg: 10 days of medicine on hand       REFERRAL TO PHARMACIST     Referral to the pharmacist: Not needed      [facility]     Shipping address confirmed in Epic.     Delivery Scheduled: Yes, Expected medication delivery date: 04/05/22.     Medication will be delivered via UPS to the prescription address in Epic WAM.    Quintella Reichert   [facility] Specialty Technician

## 2022-04-04 MED FILL — PREDNISONE 2.5 MG TABLET: ORAL | 40 days supply | Qty: 60 | Fill #3

## 2022-04-06 DIAGNOSIS — Z940 Kidney transplant status: Principal | ICD-10-CM

## 2022-04-08 ENCOUNTER — Ambulatory Visit: Admit: 2022-04-08 | Discharge: 2022-04-09 | Payer: MEDICARE

## 2022-04-08 LAB — COMPREHENSIVE METABOLIC PANEL
ALBUMIN: 4.2 g/dL (ref 3.4–5.0)
ALKALINE PHOSPHATASE: 80 U/L (ref 46–116)
ALT (SGPT): 43 U/L (ref 10–49)
ANION GAP: 8 mmol/L (ref 5–14)
AST (SGOT): 22 U/L (ref <=34)
BILIRUBIN TOTAL: 0.3 mg/dL (ref 0.3–1.2)
BLOOD UREA NITROGEN: 16 mg/dL (ref 9–23)
BUN / CREAT RATIO: 13
CALCIUM: 10.3 mg/dL (ref 8.7–10.4)
CHLORIDE: 100 mmol/L (ref 98–107)
CO2: 25.6 mmol/L (ref 20.0–31.0)
CREATININE: 1.27 mg/dL — ABNORMAL HIGH
EGFR CKD-EPI (2021) FEMALE: 55 mL/min/{1.73_m2} — ABNORMAL LOW (ref >=60)
GLUCOSE RANDOM: 102 mg/dL (ref 70–179)
POTASSIUM: 3.4 mmol/L (ref 3.4–4.8)
PROTEIN TOTAL: 6.9 g/dL (ref 5.7–8.2)
SODIUM: 134 mmol/L — ABNORMAL LOW (ref 135–145)

## 2022-04-08 LAB — CBC W/ AUTO DIFF
BASOPHILS ABSOLUTE COUNT: 0.1 10*9/L (ref 0.0–0.1)
BASOPHILS RELATIVE PERCENT: 1.1 %
EOSINOPHILS ABSOLUTE COUNT: 0.1 10*9/L (ref 0.0–0.5)
EOSINOPHILS RELATIVE PERCENT: 1 %
HEMATOCRIT: 35.3 % (ref 34.0–44.0)
HEMOGLOBIN: 11.4 g/dL (ref 11.3–14.9)
LYMPHOCYTES ABSOLUTE COUNT: 1.3 10*9/L (ref 1.1–3.6)
LYMPHOCYTES RELATIVE PERCENT: 14.9 %
MEAN CORPUSCULAR HEMOGLOBIN CONC: 32.3 g/dL (ref 32.0–36.0)
MEAN CORPUSCULAR HEMOGLOBIN: 25.1 pg — ABNORMAL LOW (ref 25.9–32.4)
MEAN CORPUSCULAR VOLUME: 77.8 fL (ref 77.6–95.7)
MEAN PLATELET VOLUME: 6.5 fL — ABNORMAL LOW (ref 6.8–10.7)
MONOCYTES ABSOLUTE COUNT: 0.5 10*9/L (ref 0.3–0.8)
MONOCYTES RELATIVE PERCENT: 6 %
NEUTROPHILS ABSOLUTE COUNT: 6.7 10*9/L (ref 1.8–7.8)
NEUTROPHILS RELATIVE PERCENT: 77 %
PLATELET COUNT: 306 10*9/L (ref 150–450)
RED BLOOD CELL COUNT: 4.54 10*12/L (ref 3.95–5.13)
RED CELL DISTRIBUTION WIDTH: 15.6 % — ABNORMAL HIGH (ref 12.2–15.2)
WBC ADJUSTED: 8.7 10*9/L (ref 3.6–11.2)

## 2022-04-08 LAB — MAGNESIUM: MAGNESIUM: 1.6 mg/dL (ref 1.6–2.6)

## 2022-04-08 LAB — PHOSPHORUS: PHOSPHORUS: 2.5 mg/dL (ref 2.4–5.1)

## 2022-04-08 MED ADMIN — belatacept (NULOJIX) 487.5 mg in sodium chloride (NS) 0.9 % 100 mL IVPB: 5 mg/kg | INTRAVENOUS | @ 19:00:00 | Stop: 2022-04-08

## 2022-04-08 NOTE — Unmapped (Signed)
[facility] for Bel[facility]pt infusion, S/P Kidney Transplant.  Patient has received previous doses of Belatacept in the Transplant Clinic and tolerated them well. Patient denies any new concerns or questions, denies any changes to her medical history, medications and allergies reviewed by Clinical research associate. 24G PIV placed right arm, patient tolerated well, labs obtained per standing orders, no premeds ordered. Call light at patient's side, pt instructed on how to use.     1512 Belatacept 487.5 mg in 100 ml started per provider order     1541 Belatacept complete.      Patient tolerated infusion with no ill effects, PIV flushed with normal saline per infusion protocol, PIV D/C'd, gauze and coban applied. Vitals stable and comparable to baseline. Patient discharged from Infusion Center in stable condition with no acute distress. Next infusion appointment made for four weeks.

## 2022-04-10 LAB — CMV DNA, QUANTITATIVE, PCR: CMV VIRAL LD: NOT DETECTED

## 2022-04-17 DIAGNOSIS — Z940 Kidney transplant status: Principal | ICD-10-CM

## 2022-04-17 DIAGNOSIS — Z79899 Other long term (current) drug therapy: Principal | ICD-10-CM

## 2022-04-25 MED FILL — MYCOPHENOLATE MOFETIL 500 MG TABLET: ORAL | 30 days supply | Qty: 120 | Fill #4

## 2022-04-25 MED FILL — CALCITRIOL 0.25 MCG CAPSULE: ORAL | 30 days supply | Qty: 30 | Fill #4

## 2022-05-06 ENCOUNTER — Ambulatory Visit: Admit: 2022-05-06 | Discharge: 2022-05-07 | Payer: MEDICARE

## 2022-05-06 LAB — CBC W/ AUTO DIFF
BASOPHILS ABSOLUTE COUNT: 0 10*9/L (ref 0.0–0.1)
BASOPHILS RELATIVE PERCENT: 0.7 %
EOSINOPHILS ABSOLUTE COUNT: 0.1 10*9/L (ref 0.0–0.5)
EOSINOPHILS RELATIVE PERCENT: 1 %
HEMATOCRIT: 34.4 % (ref 34.0–44.0)
HEMOGLOBIN: 11.4 g/dL (ref 11.3–14.9)
LYMPHOCYTES ABSOLUTE COUNT: 1.1 10*9/L (ref 1.1–3.6)
LYMPHOCYTES RELATIVE PERCENT: 15.2 %
MEAN CORPUSCULAR HEMOGLOBIN CONC: 33 g/dL (ref 32.0–36.0)
MEAN CORPUSCULAR HEMOGLOBIN: 25.6 pg — ABNORMAL LOW (ref 25.9–32.4)
MEAN CORPUSCULAR VOLUME: 77.5 fL — ABNORMAL LOW (ref 77.6–95.7)
MEAN PLATELET VOLUME: 6.5 fL — ABNORMAL LOW (ref 6.8–10.7)
MONOCYTES ABSOLUTE COUNT: 0.6 10*9/L (ref 0.3–0.8)
MONOCYTES RELATIVE PERCENT: 8.7 %
NEUTROPHILS ABSOLUTE COUNT: 5.4 10*9/L (ref 1.8–7.8)
NEUTROPHILS RELATIVE PERCENT: 74.4 %
NUCLEATED RED BLOOD CELLS: 0 /100{WBCs} (ref <=4)
PLATELET COUNT: 296 10*9/L (ref 150–450)
RED BLOOD CELL COUNT: 4.44 10*12/L (ref 3.95–5.13)
RED CELL DISTRIBUTION WIDTH: 16.3 % — ABNORMAL HIGH (ref 12.2–15.2)
WBC ADJUSTED: 7.3 10*9/L (ref 3.6–11.2)

## 2022-05-06 LAB — COMPREHENSIVE METABOLIC PANEL
ALBUMIN: 3.9 g/dL (ref 3.4–5.0)
ALKALINE PHOSPHATASE: 64 U/L (ref 46–116)
ALT (SGPT): 60 U/L — ABNORMAL HIGH (ref 10–49)
ANION GAP: 8 mmol/L (ref 5–14)
AST (SGOT): 28 U/L (ref <=34)
BILIRUBIN TOTAL: 0.5 mg/dL (ref 0.3–1.2)
BLOOD UREA NITROGEN: 17 mg/dL (ref 9–23)
BUN / CREAT RATIO: 14
CALCIUM: 9.9 mg/dL (ref 8.7–10.4)
CHLORIDE: 101 mmol/L (ref 98–107)
CO2: 27.4 mmol/L (ref 20.0–31.0)
CREATININE: 1.18 mg/dL — ABNORMAL HIGH
EGFR CKD-EPI (2021) FEMALE: 60 mL/min/{1.73_m2} (ref >=60)
GLUCOSE RANDOM: 81 mg/dL (ref 70–179)
POTASSIUM: 3.5 mmol/L (ref 3.4–4.8)
PROTEIN TOTAL: 6.7 g/dL (ref 5.7–8.2)
SODIUM: 136 mmol/L (ref 135–145)

## 2022-05-06 LAB — URINALYSIS
BILIRUBIN UA: NEGATIVE
GLUCOSE UA: NEGATIVE
KETONES UA: NEGATIVE
LEUKOCYTE ESTERASE UA: NEGATIVE
NITRITE UA: NEGATIVE
PH UA: 6.5 (ref 5.0–9.0)
RBC UA: 1 /HPF (ref <4)
SPECIFIC GRAVITY UA: 1.01 (ref 1.005–1.030)
SQUAMOUS EPITHELIAL: 1 /HPF (ref 0–5)
UROBILINOGEN UA: 0.2
WBC UA: 4 /HPF (ref 0–5)

## 2022-05-06 LAB — PHOSPHORUS: PHOSPHORUS: 2 mg/dL — ABNORMAL LOW (ref 2.4–5.1)

## 2022-05-06 LAB — MAGNESIUM: MAGNESIUM: 1.6 mg/dL (ref 1.6–2.6)

## 2022-05-06 MED ADMIN — belatacept (NULOJIX) 487.5 mg in sodium chloride (NS) 0.9 % 100 mL IVPB: 5 mg/kg | INTRAVENOUS | @ 19:00:00 | Stop: 2022-05-06

## 2022-05-06 NOTE — Unmapped (Signed)
[facility] infusion. Patient denies any new concerns or questions, denies any changes to her medical history, medications and allergies reviewed by Clinical research associate. 24G PIV placed in RDA, patient tolerated well,no premeds ordered. Call light at patient's side, pt instructed on how to use.     1452 Belatacept 487.5mg  in 100 ml started per provider order      1523 Belatacept complete.      Patient tolerated infusion with no ill effects, PIV flushed with normal saline per infusion protocol, PIV D/C'd, gauze and coban applied. Vitals stable and comparable to baseline. Patient discharged from Infusion Center in stable condition with no acute distress.

## 2022-05-09 LAB — CMV DNA, QUANTITATIVE, PCR: CMV VIRAL LD: NOT DETECTED

## 2022-05-09 NOTE — Unmapped (Signed)
Reviewed pt's phosphorus level from 05/06/22 as 2.0. Called pt and instructed her regarding a phosphorus diet and food/beverages that she can include to increase her phosphorus level. Pt verbalized understanding and voiced no complaints. Stated that she was feeling fine and had no issues.

## 2022-05-16 MED ORDER — SERTRALINE 100 MG TABLET
ORAL_TABLET | 3 refills | 0 days
Start: 2022-05-16 — End: ?

## 2022-05-16 NOTE — Unmapped (Signed)
[facility] Refill Coordination Note    Specialty Medication(s) to be Shipped:   Transplant: mycophenolate mofetil 500mg  and Prednisone 2.5mg     Other medication(s) to be shipped:  calcitriol      Kimberly [name], DOB: Jun 27, 1981  Phone: [phone] (home)       All above HIPAA information was verified with patient.     Was a Nurse, learning disability used for this call? No    Completed refill call assessment today to schedule patient's medication shipment from the [facility] [phone]).  All relevant notes have been reviewed.     Specialty medication(s) and dose(s) confirmed: Regimen is correct and unchanged.   Changes to medications: Mazee reports no changes at this time.  Changes to insurance: No  New side effects reported not previously addressed with a pharmacist or physician: None reported  Questions for the pharmacist: No    Confirmed patient received a Conservation officer, historic buildings and a Surveyor, mining with first shipment. The patient will receive a drug information handout for each medication shipped and additional FDA Medication Guides as required.       DISEASE/MEDICATION-SPECIFIC INFORMATION        N/A    SPECIALTY MEDICATION ADHERENCE     Medication Adherence    Patient reported X missed doses in the last month: 0  Specialty Medication: mycophenolate 500 mg  Patient is on additional specialty medications: Yes  Additional Specialty Medications: Prednisone 2.5 mg   Patient Reported Additional Medication X Missed Doses in the Last Month: 0  Patient is on more than two specialty medications: No              Were doses missed due to medication being on hold? No    Mycophenolate  500 mg: 7 days of medicine on hand   prednisone 2.5 mg: 7 days of medicine on hand       REFERRAL TO PHARMACIST     Referral to the pharmacist: Not needed      [facility]     Shipping address confirmed in Epic.     Delivery Scheduled: Yes, Expected medication delivery date: 05/23/22.     Medication will be delivered via UPS to the prescription address in Epic WAM.    Quintella Reichert   [facility] Specialty Technician

## 2022-05-17 MED ORDER — SERTRALINE 100 MG TABLET
ORAL_TABLET | 3 refills | 0 days | Status: CP
Start: 2022-05-17 — End: ?

## 2022-05-19 NOTE — Unmapped (Unsigned)
[facility]  Return Patient Evaluation & Consultation    Referring Provider: Leeroy Bock, MD  PCP: Letitia Caul, MD  Date of Service: 05/20/2022      SUBJECTIVE  Chief Complaint: No chief complaint on file.    History of Present Illness: Kimberly [name] is a 41 y.o. White female initially seen in consultation at the request of Dr. Nestor Lewandowsky for evaluation of recurrent UTI. 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 She receives UCx as screening for transplant surveillance. Urine culture history seen below. She denies UTI symptoms with the past 2 positive cultures. The last time she experienced UTI sx was ~2 years ago - noted to have dysuria and pelvic pressure. She otherwise has no other pelvic floor complaints. She takes cranberry supplements for UTI prevention. Last seen 12/2021. She was counseled in detail that we would not recommend treatment of asymptomatic bacteruria seen on urine culture as current evidence does not suggest reduction in graft transplantation and also due to risk of antibiotic resistance. She was started on 2g D-mannose in addition to the Cranberry supplements she takes.    Today, ***    Prior review of records significant for:  From Nephrology note, Dr. Nestor Lewandowsky 12/17/21  Patient was found to have reflux nephropathy at age 49 and was on peritoneal dialysis briefly before receiving a living related renal transplant from mother. She had some thrombotic complication and underwent nephrectomy in 1997. She started dialysis and had a second kidney transplant at age 42 in 79. 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 She received a 3rd kidney transplant in 11/2002 which failed in 2007 due to chronic rejection.     She has decreased vision both eyes.    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 She received a 3rd kidney transplant in 11/2002 which failed in 2007 due to chronic rejection.     She has decreased vision both eyes.    1) s/p 4th Kidney txp - 12/05/18 - native disease reflux nephropathy  1st kidney transplant - 1995 -1997 - LR failed due to thrombotic issues.  2nd kidney transplant - 2000-2002 - DD failed chronic rejection.  3rd kidney transplant - 2004 - 2007 - DD failed in 2007 chronic rejection.     - Post-transplant course complicated by delayed graft function.      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     Creatinine - value 1.54, slightly up - will monitor.  Urine analysis: negative protein / WBC 5 / RBC 52 - monitor RBC in urine  Urine protein/creatinine ratio: 0.589 (12/17/21)  DSA: B7 (1463) - 05/26/21     Last CMV checked: not detected - 12/17/21  Last BKV checked: not detected - 05/26/21     2) Immunosuppression: Cellcept 1000 mg BID / Prednisone 5 mg every other day and 2.5 mg every other day / Belatacept monthly (last on 11/19/21)  - On belatacept due to biopsy findings.  - Changes in Immunosuppression - None today. Will continue current dose of prednisone for 6  more months, then will consider decreasing.  - Medications side effects: No     3) UTI: Asymptomatic today  - History of recent recurrent UTI's.   - Previously referred to urogyn given risk of future antibiotic resistance      Lab Results   Component Value Date    Urine Culture, Comprehensive Mixed Urogenital Flora 03/04/2022    Urine Culture, Comprehensive Mixed Urogenital Flora 12/17/2021    Urine Culture, Comprehensive (A) 09/22/2021     Mixed Gram Positive/Gram Negative Organisms Isolated    Urine Culture, Comprehensive 10,000 to 50,000 CFU/mL Enterococcus faecalis (A) Urogenital Flora 10/30/2020    Urine Culture, Comprehensive 50,000 to 100,000 CFU/mL Proteus mirabilis (A) 10/09/2020    Urine Culture, Comprehensive Mixed Urogenital Flora 09/11/2020    Urine Culture, Comprehensive Mixed Urogenital Flora 09/11/2020    Urine Culture, Comprehensive Mixed Urogenital Flora 06/19/2020    Urine Culture, Comprehensive Mixed Urogenital Flora 05/06/2020    Urine Culture, Comprehensive NO GROWTH 05/06/2020    Urine Culture, Comprehensive NO GROWTH 01/31/2020    Urine Culture, Comprehensive Mixed Urogenital Flora 12/27/2019    Urine Culture, Comprehensive Mixed Urogenital Flora 12/11/2019    Urine Culture, Comprehensive NO GROWTH 11/29/2019    Urine Culture, Comprehensive NO GROWTH 11/01/2019    Urine Culture, Comprehensive NO GROWTH 08/30/2019    Urine Culture, Comprehensive NO GROWTH 08/20/2019    Urine Culture, Comprehensive Mixed Urogenital Flora 07/22/2019    Urine Culture, Comprehensive Mixed Urogenital Flora 07/08/2019    Urine Culture, Comprehensive >100,000 CFU/mL Klebsiella pneumoniae (A) 06/28/2019    Urine Culture, Comprehensive <10,000 CFU/mL 06/24/2019    Urine Culture, Comprehensive Mixed Urogenital Flora 06/17/2019    Urine Culture, Comprehensive NO GROWTH 06/10/2019    Urine Culture, Comprehensive 50,000 to 100,000 CFU/mL Klebsiella pneumoniae (A) 06/06/2019    Urine Culture, Comprehensive Mixed Urogenital Flora 05/27/2019    Urine Culture, Comprehensive >100,000 CFU/mL Klebsiella pneumoniae (A) 05/17/2019    Urine Culture, Comprehensive Mixed Urogenital Flora 12/21/2018    Urine Culture, Comprehensive Mixed Urogenital Flora 12/17/2018       Renal US 11/2020  IMPRESSION:  --Stable resistive indices in the renal transplant arteries, with unchanged minimal elevation of the resistive indices in the mid/anastomotic main renal artery. Patent transplant vasculature.  --Surgically absent native kidneys.  --Mild pelviectasis and/or trace hydronephrosis, similar to prior.    Her last Cr 1.54 (12/17/21) with CrCl 76ml/min    Urinary Symptoms:  Stress urinary incontinence: no. -  Urgency urinary incontinence: no. -   Leaks 0 time(s) per day(s).   Pad use: none.      Fluid Intake:  2L water, 12oz coffee, 0 tea, 0 soda/carbonated beverages, 0 alcohol, 0juice    Voiding Symptoms  Reports daytime frequency and/or urgency: q3-4hrs.    Nocturia: 1-2 times per night to void.    Prior Treatment: none    Urinary Tract Symptoms  UTIs: 2 UTI's in the last year.    She denies history of blood in the urine but has had prior workup  She denies any history of kidney stones.     Pelvic Organ Prolapse Symptoms:                   She denies a feeling of a bulge the vaginal area.   She denies seeing a bulge.     Bowel Symptom:  Bowel movements: 1 time(s) per day(s).    Stool consistency: soft.    Bowel regimen: none  She denies accidental bowel  leakage / fecal incontinence  Straining: no.   Splinting: no.   Incomplete evacuation: no.   Fecal urgency: no.   Last colonoscopy was n/a.      Past Medical History: Patient  has a past medical history of Bell's palsy, Disease of thyroid gland, ESRD (end stage renal disease) (CMS-HCC), Hypertriglyceridemia, Lymphedema, Nonarteritic ischemic optic neuropathy, and Reflux nephropathy.     Past Surgical History: She  has a past surgical history that includes AV fistula placement (Left); Parathyroidectomy (Bilateral, 2010); Skin biopsy; Skin biopsy (Left); Nephrectomy (Bilateral); pr transplantation of kidney (Left, 12/05/2018); and pr transplant,prep cadaver renal graft (Left, 12/05/2018).     Obstetric History:  OB History   Gravida Para Term Preterm AB Living   1         1   SAB IAB Ectopic Molar Multiple Live Births                    # Outcome Date GA Lbr Len/2nd Weight Sex Delivery Anes PTL Lv   1 Gravida                 She has had 1 cesarean deliveries.     Gynecologic History:  She is not menopausal.    She is sexually active. She denies pain with intercourse.  Last pap smear was 11/06/19 negative.  She does not have a history of abnormal Pap smears.       Medications: She has a current medication list which includes the following prescription(s): acetaminophen, ascorbic acid (vitamin c), aspirin, calcitriol, carboxymethylcellulose sodium, chlorthalidone, cranberry, cranberry-vitamin c-mannose, ezetimibe, ferrous sulfate, mycophenolate, omega-3 acid ethyl esters, omeprazole, prednisone, and sertraline.   Allergies: Patient is allergic to ancef [cefazolin], cephalosporins, adhesive tape-silicones, other, demerol [meperidine], and pravastatin.     Social History: Patient  reports that she has never smoked. She has never used smokeless tobacco. She reports that she does not drink alcohol and does not use drugs. . Occupation: not working outside the home.  Family History: family history includes Cancer in her maternal grandfather, maternal grandmother, paternal grandfather, and paternal grandmother; Diabetes in her father; Kidney disease in her brother and father.     Review of Systems: A 10 system review of systems was otherwise negative except as noted in the HPI.    OBJECTIVE  Physical Exam:  There were no vitals filed for this visit.      Medical chaperone present for exam: Vara Guardian, RN    Constitutional:  Well appearing female in no acute distress.    Neuro: Alert and oriented x 3.  Respiratory:  Normal respiratory effort.   Cardiovascular:  LUE lymphedema.    Lymphatic: LUE lymphedema.   Musculoskeletal: Ambulatory, moves all limbs without difficulty, extremities warm and well perfused      Prior GU / Detailed Urogynecologic Evaluation:   Pelvic Exam: Normal external female genitalia; Bartholin's and Skene's glands normal in appearance; Urethral meatus without urethral caruncle , otherwise normal in appearance; no urethral masses or discharge.   Speculum exam reveals: normal vaginal mucosa without atrophy. Cervix normal appearance. Uterus normal single, nontender. Adnexa no mass, fullness, tenderness.      Negative VST and CST        01/12/2022     4:15 PM   POP-Q Exams   gh 3   pb 3.5   tvl 10   aa -3   ba -3   ap -3   bp -3   c -8  d -10         MSK:    Pelvic floor strength III/V,    Pelvic Floor Muscle Pain Exam:  Right retropubic: 0/10, Right obturator: 0/10, Right levator ani: 0/10,   Left levator ani: 0/10, Left obturator 0/10, Left retropubic: 0/10    Neuro:  Sensations: Intact    Rectal Exam:   Anal verge:  Normal in appearance, without evidence of external hemorrhoids;    Prior (PVR) by Bladder Scan: 12/2021  A PVR of 17 ml was obtained by bladder scan.    Laboratory Results:  No results found for this visit on 05/20/22.       I visualized the urine specimen, noting the specimen to be urine color: clear yellow    ASSESSMENT AND PLAN  Ms. Wohlgemuth is a 41 y.o. 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       1. Recurrent asymptomatic bacteriuria  -She has no contributing factors such as prolapse, incomplete emptying (nl PVR), fecal incontinence, or atrophy on evaluation  -We previously discussed that we typically reserve testing and treatment of UTI for UTI symptoms. Also treatment of asymptomatic bacteriuria has not been shown to significantly reduce the risk of recurrent symptomatic UTI or graft rejection  -we discussed that increased use of antibiotics are associated with increased risk of resistance and side effects. In her case, we understand the sensitivity to bacteriuria as this is her 4th renal transplant.   -Continue D-mannose 2000mg  daily supplementation and Cranberry supplements  Given the organisms that grew in her last 2 positive UCx (Enterococcus)  -she continues to have negative urine cultures since her last visit.   -can also consider addition of Methenamine 1g BID or daily (depending on CrCl) if she develops refractory UTI or if UCx grows Proteus as D-mannose is not protective against Proteus  -We discussed the role of diagnostic testing such as cystoscopy and upper tract imaging.   -She undergoes yearly Renal US with last US showing mild but stable pelivectasis (01/2021)  -***Will consider cystoscopy in the future if persistent or refractory UTI      All questions answered.  Follow up in 3 months or sooner via MyChart. If she is doing well, she can follow up yearly or as needed.  Seen and D/w Dr. Luna Fuse, MD   Urogynecology Fellow  [facility]

## 2022-05-20 ENCOUNTER — Ambulatory Visit: Admit: 2022-05-20 | Discharge: 2022-05-21 | Payer: MEDICARE

## 2022-05-20 DIAGNOSIS — Z940 Kidney transplant status: Principal | ICD-10-CM

## 2022-05-20 DIAGNOSIS — R8271 Bacteriuria: Principal | ICD-10-CM

## 2022-05-20 MED FILL — CALCITRIOL 0.25 MCG CAPSULE: ORAL | 30 days supply | Qty: 30 | Fill #5

## 2022-05-20 MED FILL — MYCOPHENOLATE MOFETIL 500 MG TABLET: ORAL | 30 days supply | Qty: 120 | Fill #5

## 2022-05-20 MED FILL — PREDNISONE 2.5 MG TABLET: ORAL | 40 days supply | Qty: 60 | Fill #4

## 2022-05-20 NOTE — Unmapped (Addendum)
For treatment of recurrent urinary tract infections, we discussed management of recurrent UTIs including prophylaxis with a daily low dose antibiotic, transvaginal estrogen therapy, D-mannose, and cranberry supplements.  We discussed the role of diagnostic testing such as cystoscopy and upper tract imaging.  We discussed that we do not routinely treat asymptomatic bacteria in the urine and typically reserve urine cultures for symptoms of UTI.     For UTI prevention you will  1) Continue D-mannose 1000mg  daily since this is working for you . If you have recurrent UTIs despite this therapy, which is defined as 2 culture proven UTIs with symptoms in 6 months or 3 in one year, you can increase to 2000mg  daily if needed for persistent UTI  2) You can take cranberry supplements along with D-mannose  3) If you continue to have UTIs despite Cranberry and D-mannose 2g daily, you can begin methenamine hippurate 1g daily or 1g twice daily (depending on your kidney function) along with a Vitamin C supplement. Your transplant team or nephrologist can start you on this    We will not perform cystoscopy at this time since your symptoms are controlled and will consider this if you have breakthrough UTIs

## 2022-05-23 NOTE — Unmapped (Signed)
I was immediately available and present on site.  I reviewed and discussed the case with the fellow, but did not see the patient.  I agree with the assessment and plan as documented in the note. Aura Camps, MD

## 2022-06-03 ENCOUNTER — Ambulatory Visit: Admit: 2022-06-03 | Discharge: 2022-06-04 | Payer: MEDICARE

## 2022-06-03 LAB — MAGNESIUM: MAGNESIUM: 1.7 mg/dL (ref 1.6–2.6)

## 2022-06-03 LAB — COMPREHENSIVE METABOLIC PANEL
ALBUMIN: 4.1 g/dL (ref 3.4–5.0)
ALKALINE PHOSPHATASE: 69 U/L (ref 46–116)
ALT (SGPT): 45 U/L (ref 10–49)
ANION GAP: 8 mmol/L (ref 5–14)
AST (SGOT): 28 U/L (ref <=34)
BILIRUBIN TOTAL: 0.4 mg/dL (ref 0.3–1.2)
BLOOD UREA NITROGEN: 17 mg/dL (ref 9–23)
BUN / CREAT RATIO: 14
CALCIUM: 10.3 mg/dL (ref 8.7–10.4)
CHLORIDE: 101 mmol/L (ref 98–107)
CO2: 28 mmol/L (ref 20.0–31.0)
CREATININE: 1.22 mg/dL — ABNORMAL HIGH
EGFR CKD-EPI (2021) FEMALE: 57 mL/min/{1.73_m2} — ABNORMAL LOW (ref >=60)
GLUCOSE RANDOM: 59 mg/dL — ABNORMAL LOW (ref 70–179)
POTASSIUM: 3.2 mmol/L — ABNORMAL LOW (ref 3.4–4.8)
PROTEIN TOTAL: 6.9 g/dL (ref 5.7–8.2)
SODIUM: 137 mmol/L (ref 135–145)

## 2022-06-03 LAB — CBC W/ AUTO DIFF
BASOPHILS ABSOLUTE COUNT: 0.1 10*9/L (ref 0.0–0.1)
BASOPHILS RELATIVE PERCENT: 1.2 %
EOSINOPHILS ABSOLUTE COUNT: 0.1 10*9/L (ref 0.0–0.5)
EOSINOPHILS RELATIVE PERCENT: 1.3 %
HEMATOCRIT: 35.1 % (ref 34.0–44.0)
HEMOGLOBIN: 11.4 g/dL (ref 11.3–14.9)
LYMPHOCYTES ABSOLUTE COUNT: 1.4 10*9/L (ref 1.1–3.6)
LYMPHOCYTES RELATIVE PERCENT: 17.6 %
MEAN CORPUSCULAR HEMOGLOBIN CONC: 32.5 g/dL (ref 32.0–36.0)
MEAN CORPUSCULAR HEMOGLOBIN: 26 pg (ref 25.9–32.4)
MEAN CORPUSCULAR VOLUME: 79.8 fL (ref 77.6–95.7)
MEAN PLATELET VOLUME: 7 fL (ref 6.8–10.7)
MONOCYTES ABSOLUTE COUNT: 0.6 10*9/L (ref 0.3–0.8)
MONOCYTES RELATIVE PERCENT: 7.6 %
NEUTROPHILS ABSOLUTE COUNT: 5.6 10*9/L (ref 1.8–7.8)
NEUTROPHILS RELATIVE PERCENT: 72.3 %
NUCLEATED RED BLOOD CELLS: 0 /100{WBCs} (ref <=4)
PLATELET COUNT: 352 10*9/L (ref 150–450)
RED BLOOD CELL COUNT: 4.39 10*12/L (ref 3.95–5.13)
RED CELL DISTRIBUTION WIDTH: 16.5 % — ABNORMAL HIGH (ref 12.2–15.2)
WBC ADJUSTED: 7.8 10*9/L (ref 3.6–11.2)

## 2022-06-03 LAB — PHOSPHORUS: PHOSPHORUS: 2.2 mg/dL — ABNORMAL LOW (ref 2.4–5.1)

## 2022-06-03 MED ADMIN — belatacept (NULOJIX) 500 mg in sodium chloride (NS) 0.9 % 100 mL IVPB: 5 mg/kg | INTRAVENOUS | @ 19:00:00 | Stop: 2022-06-03

## 2022-06-03 NOTE — Unmapped (Signed)
[facility] Bel[facility]pt infusion, S/P Kidney Transplant 2020.  Patient denies any new concerns or questions, denies any changes to her medical history, medications and allergies reviewed by Clinical research associate. 24G PIV placed to right forearm, patient tolerated well, labs obtained per standing orders, no premeds ordered. Call light at patient's side, pt instructed on how to use.     1454 Belatacept 500 mg in 100 ml started per provider order     1524 Belatacept complete.      Patient tolerated infusion with no ill effects, PIV flushed with normal saline per infusion protocol, PIV D/C'd, gauze and coban applied. Vitals stable and comparable to baseline. Patient discharged from Infusion Center in stable condition with no acute distress.

## 2022-06-04 LAB — CMV DNA, QUANTITATIVE, PCR: CMV VIRAL LD: NOT DETECTED

## 2022-06-10 NOTE — Unmapped (Signed)
[facility] Refill Coordination Note    Specialty Medication(s) to be Shipped:   Transplant: mycophenolate mofetil 500mg  and Prednisone 2.5mg     Other medication(s) to be shipped: calcitriol 0.25 MCG     Kimberly [name], DOB: [birth year]  Phone: [phone] (home)       All above HIPAA information was verified with patient.     Was a Nurse, learning disability used for this call? No    Completed refill call assessment today to schedule patient's medication shipment from the [facility] [phone]).  All relevant notes have been reviewed.     Specialty medication(s) and dose(s) confirmed: Regimen is correct and unchanged.   Changes to medications: Kimberly [name] reports no changes at this time.  Changes to insurance: No  New side effects reported not previously addressed with a pharmacist or physician: None reported  Questions for the pharmacist: No    Confirmed patient received a Conservation officer, historic buildings and a Surveyor, mining with first shipment. The patient will receive a drug information handout for each medication shipped and additional FDA Medication Guides as required.       DISEASE/MEDICATION-SPECIFIC INFORMATION        N/A    SPECIALTY MEDICATION ADHERENCE     Medication Adherence    Patient reported X missed doses in the last month: 0  Specialty Medication: mycophenolate 500 mg  Patient is on additional specialty medications: Yes  Additional Specialty Medications: predniSONE 2.5 MG   Patient Reported Additional Medication X Missed Doses in the Last Month: 0  Patient is on more than two specialty medications: No                                Were doses missed due to medication being on hold? No    mycophenolate 500 mg: 14 days of medicine on hand   predniSONE 2.5 mg: 14 days of medicine on hand       REFERRAL TO PHARMACIST     Referral to the pharmacist: Not needed      [facility]     Shipping address confirmed in Epic.     Delivery Scheduled: Yes, Expected medication delivery date: 06/16/22.     Medication will be delivered via UPS to the prescription address in Epic WAM.    Kimberly [name]   [facility]berly [name] Specialty Technician

## 2022-06-12 DIAGNOSIS — Z79899 Other long term (current) drug therapy: Principal | ICD-10-CM

## 2022-06-12 DIAGNOSIS — Z940 Kidney transplant status: Principal | ICD-10-CM

## 2022-06-15 NOTE — Unmapped (Signed)
Kimberly [name] 's entire shipment will be delayed as a result of the medication is too soon to refill until 06/17/2022 (Mycophenolate and Calcitriol) and 06/19/2022 (Prednisone).     I have reached out to the patient  at [phone] and communicated the delivery change. We will reschedule the medication for the delivery date that the patient agreed upon.  We have confirmed the delivery date as 06/21/2022, via ups.

## 2022-06-20 MED FILL — MYCOPHENOLATE MOFETIL 500 MG TABLET: ORAL | 30 days supply | Qty: 120 | Fill #6

## 2022-06-20 MED FILL — CALCITRIOL 0.25 MCG CAPSULE: ORAL | 30 days supply | Qty: 30 | Fill #6

## 2022-06-20 MED FILL — PREDNISONE 2.5 MG TABLET: ORAL | 40 days supply | Qty: 60 | Fill #5

## 2022-06-28 DIAGNOSIS — Z940 Kidney transplant status: Principal | ICD-10-CM

## 2022-06-28 DIAGNOSIS — Z79899 Other long term (current) drug therapy: Principal | ICD-10-CM

## 2022-07-01 ENCOUNTER — Ambulatory Visit: Admit: 2022-07-01 | Discharge: 2022-07-01 | Payer: MEDICARE

## 2022-07-01 ENCOUNTER — Ambulatory Visit: Admit: 2022-07-01 | Discharge: 2022-07-01 | Payer: MEDICARE | Attending: Nephrology | Primary: Nephrology

## 2022-07-01 DIAGNOSIS — Z79899 Other long term (current) drug therapy: Principal | ICD-10-CM

## 2022-07-01 DIAGNOSIS — N189 Chronic kidney disease, unspecified: Principal | ICD-10-CM

## 2022-07-01 DIAGNOSIS — D631 Anemia in chronic kidney disease: Principal | ICD-10-CM

## 2022-07-01 DIAGNOSIS — Z940 Kidney transplant status: Principal | ICD-10-CM

## 2022-07-01 DIAGNOSIS — Z1159 Encounter for screening for other viral diseases: Principal | ICD-10-CM

## 2022-07-01 DIAGNOSIS — N186 End stage renal disease: Principal | ICD-10-CM

## 2022-07-01 LAB — COMPREHENSIVE METABOLIC PANEL
ALBUMIN: 4.2 g/dL (ref 3.4–5.0)
ALKALINE PHOSPHATASE: 79 U/L (ref 46–116)
ALT (SGPT): 35 U/L (ref 10–49)
ANION GAP: 11 mmol/L (ref 5–14)
AST (SGOT): 21 U/L (ref <=34)
BILIRUBIN TOTAL: 0.2 mg/dL — ABNORMAL LOW (ref 0.3–1.2)
BLOOD UREA NITROGEN: 15 mg/dL (ref 9–23)
BUN / CREAT RATIO: 12
CALCIUM: 9.8 mg/dL (ref 8.7–10.4)
CHLORIDE: 101 mmol/L (ref 98–107)
CO2: 25.5 mmol/L (ref 20.0–31.0)
CREATININE: 1.27 mg/dL — ABNORMAL HIGH
EGFR CKD-EPI (2021) FEMALE: 54 mL/min/{1.73_m2} — ABNORMAL LOW (ref >=60)
GLUCOSE RANDOM: 120 mg/dL (ref 70–179)
POTASSIUM: 3.5 mmol/L (ref 3.4–4.8)
PROTEIN TOTAL: 6.7 g/dL (ref 5.7–8.2)
SODIUM: 137 mmol/L (ref 135–145)

## 2022-07-01 LAB — CBC W/ AUTO DIFF
BASOPHILS ABSOLUTE COUNT: 0.1 10*9/L (ref 0.0–0.1)
BASOPHILS RELATIVE PERCENT: 1.1 %
EOSINOPHILS ABSOLUTE COUNT: 0.1 10*9/L (ref 0.0–0.5)
EOSINOPHILS RELATIVE PERCENT: 1.2 %
HEMATOCRIT: 35 % (ref 34.0–44.0)
HEMOGLOBIN: 11.8 g/dL (ref 11.3–14.9)
LYMPHOCYTES ABSOLUTE COUNT: 1.6 10*9/L (ref 1.1–3.6)
LYMPHOCYTES RELATIVE PERCENT: 16.6 %
MEAN CORPUSCULAR HEMOGLOBIN CONC: 33.8 g/dL (ref 32.0–36.0)
MEAN CORPUSCULAR HEMOGLOBIN: 26.8 pg (ref 25.9–32.4)
MEAN CORPUSCULAR VOLUME: 79.3 fL (ref 77.6–95.7)
MEAN PLATELET VOLUME: 6.5 fL — ABNORMAL LOW (ref 6.8–10.7)
MONOCYTES ABSOLUTE COUNT: 0.6 10*9/L (ref 0.3–0.8)
MONOCYTES RELATIVE PERCENT: 6 %
NEUTROPHILS ABSOLUTE COUNT: 7.1 10*9/L (ref 1.8–7.8)
NEUTROPHILS RELATIVE PERCENT: 75.1 %
PLATELET COUNT: 377 10*9/L (ref 150–450)
RED BLOOD CELL COUNT: 4.41 10*12/L (ref 3.95–5.13)
RED CELL DISTRIBUTION WIDTH: 14.8 % (ref 12.2–15.2)
WBC ADJUSTED: 9.4 10*9/L (ref 3.6–11.2)

## 2022-07-01 LAB — URINALYSIS WITH MICROSCOPY
BILIRUBIN UA: NEGATIVE
GLUCOSE UA: NEGATIVE
KETONES UA: NEGATIVE
LEUKOCYTE ESTERASE UA: NEGATIVE
NITRITE UA: NEGATIVE
PH UA: 6.5 (ref 5.0–9.0)
RBC UA: 50 /HPF — ABNORMAL HIGH (ref <4)
SPECIFIC GRAVITY UA: 1.015 (ref 1.005–1.030)
SQUAMOUS EPITHELIAL: 1 /HPF (ref 0–5)
UROBILINOGEN UA: 0.2
WBC UA: 5 /HPF (ref 0–5)

## 2022-07-01 LAB — IRON & TIBC
IRON SATURATION: 4 % — ABNORMAL LOW (ref 20–55)
IRON: 14 ug/dL — ABNORMAL LOW
TOTAL IRON BINDING CAPACITY: 346 ug/dL (ref 250–425)

## 2022-07-01 LAB — HEMOGLOBIN A1C
ESTIMATED AVERAGE GLUCOSE: 114 mg/dL
HEMOGLOBIN A1C: 5.6 % (ref 4.8–5.6)

## 2022-07-01 LAB — PROTEIN / CREATININE RATIO, URINE
CREATININE, URINE: 78.3 mg/dL
PROTEIN URINE: 20.5 mg/dL
PROTEIN/CREAT RATIO, URINE: 0.262

## 2022-07-01 LAB — PHOSPHORUS

## 2022-07-01 LAB — MAGNESIUM: MAGNESIUM: 1.6 mg/dL (ref 1.6–2.6)

## 2022-07-01 LAB — FERRITIN: FERRITIN: 14.3 ng/mL

## 2022-07-01 MED ADMIN — belatacept (NULOJIX) 500 mg in sodium chloride (NS) 0.9 % 100 mL IVPB: 5 mg/kg | INTRAVENOUS | @ 19:00:00 | Stop: 2022-07-01

## 2022-07-01 NOTE — Unmapped (Signed)
[facility] Bel[facility] infusion, S/P Kidney Transplant.  Patient has received previous doses of Belatacept in the Transplant Clinic and tolerated them well. Patient denies any new concerns or questions, denies any changes to her medical history, medications and allergies reviewed by Clinical research associate. 24G PIV placed right arm, patient tolerated well, labs obtained per standing orders, no premeds ordered. Call light at patient's side, pt instructed on how to use.     1505 Belatacept 500 mg in 100 ml started per provider order     1536 Belatacept complete.      Patient tolerated infusion with no ill effects, PIV flushed with normal saline per infusion protocol, PIV D/C'd, gauze and coban applied. Vitals stable and comparable to baseline. Patient discharged from Infusion Center in stable condition with no acute distress. Next infusion appointment made for four weeks.

## 2022-07-01 NOTE — Unmapped (Signed)
Assessment    Met w/ patient in [facility] today. Reviewed meds/symptoms. Any new medications? Stopped DMannose/Cranberry and Cranberry extra - Now on Dmannose only. Doing well, Heartburn improved and stable on Prilosec                Fever/cold/flu symptoms denies  BP: 118/84 today/ Home BP reported stable, checks daily  BG: wnl  Headache/Dizziness/Lightheaded: denies  Hand tremors: denies  Numbness/tingling: denies  Fevers/chills/sweats: denies  CP/SOB/palpatations: denies  Nausea/vomiting/heartburn: heartburn controlled  Diarrhea/constipation: denies  UTI symptoms (burn/pain/itch/frequency/urgency/odor/color/foam): controlled  No visible or palpable edema    Appetite good; reports adequate hydration. 2+L/day    Pt reports being well rested and getting adequate exercise.    Continues to follow Covid/health safety precautions by taking care to mask, perform frequent hand hygeine and minimal public activity. Offered support and guidance for this process given their immune-suppressed state. We discussed reduced covid vaccine coverage for transplant patients and importance of continuing to mask and practice safe distancing. Commented that booster vaccines will likely be advised as an ongoing process.    Belatacept infusion today. Current dose; Cellcept 1000mg  bid, Pred alternating 2.5/5mg     Referrals needed: none    Pt Follow up w/ Derm, UTD mammogram, Pap due in Jan 24    Immunization status: needs Flu, Covid and RSV vaccines locally     Functional Score: 100

## 2022-07-02 LAB — CMV DNA, QUANTITATIVE, PCR
CMV QUANT: <50 [IU]/mL — ABNORMAL HIGH (ref <0)
CMV VIRAL LD: DETECTED — AB

## 2022-07-04 NOTE — Unmapped (Signed)
Hi Lauren: Severe iron deficiency anemia: Recommend Feraheme (ferumoxytol) 510 mg X 2 doses 3-5 days apart.  Recommend GI consult/evaluation for sources of iron loss (even if she has heavy periods).

## 2022-07-06 LAB — HLA DS POST TRANSPLANT
ANTI-DONOR DRW #1 MFI: 0 MFI
ANTI-DONOR DRW #2 MFI: 0 MFI
ANTI-DONOR HLA-A #1 MFI: 0 MFI
ANTI-DONOR HLA-A #2 MFI: 0 MFI
ANTI-DONOR HLA-B #1 MFI: 456 MFI
ANTI-DONOR HLA-B #2 MFI: 0 MFI
ANTI-DONOR HLA-C #1 MFI: 0 MFI
ANTI-DONOR HLA-DP #2 MFI: 0 MFI
ANTI-DONOR HLA-DQB #1 MFI: 0 MFI
ANTI-DONOR HLA-DQB #2 MFI: 0 MFI
ANTI-DONOR HLA-DR #1 MFI: 0 MFI
ANTI-DONOR HLA-DR #2 MFI: 0 MFI

## 2022-07-06 LAB — BK VIRUS QUANTITATIVE PCR, BLOOD: BK BLOOD RESULT: NOT DETECTED

## 2022-07-06 LAB — FSAB CLASS 1 ANTIBODY SPECIFICITY: HLA CLASS 1 ANTIBODY RESULT: POSITIVE

## 2022-07-06 LAB — FSAB CLASS 2 ANTIBODY SPECIFICITY: HLA CL2 AB RESULT: NEGATIVE

## 2022-07-06 LAB — VITAMIN D 25 HYDROXY: VITAMIN D, TOTAL (25OH): 31.6 ng/mL (ref 20.0–80.0)

## 2022-07-07 LAB — VITAMIN D 1,25 DIHYDROXY: VITAMIN D 1,25-DIHYDROXY: 44 pg/mL

## 2022-07-08 NOTE — Unmapped (Signed)
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  2nd kidney transplant - 2000-2002 - DD failed chronic rejection.  3rd kidney transplant - 2004 - 2007 - DD failed in 2007 chronic rejection.      Creatinine - value 1.27: baseline        2) Immunosuppression: Cellcept 1000 mg BID / Prednisone 5 mg every other day and 2.5 mg every other day / Belatacept monthly (last on 11/19/21)  - On belatacept due to biopsy findings.  - Changes in Immunosuppression - None today. Will continue current dose of prednisone for 3 more months, then will consider decreasing.  - Medications side effects: No    3) UTI: Asymptomatic today  - History of recent recurrent UTI's.   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    8) Leg swelling HTN    Chlorthalidone 25 mg po daily    9) Immunizations:      Immunization History   Administered Date(s) Administered    COVID-19 VAC,BIVALENT(39YR UP),PFIZER 12/17/2021    COVID-19 VAC,BIVALENT,MODERNA(BLUE CAP) 08/15/2021    COVID-19 VAC,MRNA,TRIS(12Y UP)(PFIZER)(GRAY CAP) 08/01/2020, 12/28/2020    COVID-19 VACC,MRNA,(PFIZER)(PF) 01/15/2020, 02/05/2020, 08/01/2020, 12/28/2020    HPV Bivalent 08/15/2021    Hepatitis A 06/06/2013, 11/27/2013    Influenza Vaccine Quad (IIV4 PF) 71mo+ injectable 07/26/2019, 10/30/2020    Influenza Virus Vaccine, unspecified formulation 08/09/2018, 10/30/2020    Pneumococcal Conjugate 13-Valent 06/11/2018      10) Cancer screening:    She is staying up to date    11) Left UE swelling - patient following up in lymphedema clinic every 6 months.     12) High Cholesterol - Pt was previously on pravastatin 20 mg daily, but d/t all over muscle twitching discontinued it three weeks later.  On Zetia 10 mg daily. Tolerating well.  Lovaza      13) Anxiety: improved  - Pt previously endorsed severe night sweats while on Celexa.  - Continue Zoloft 100mg  daily.    Follow up:  4 months.    History of Presenting Illness:     Since patient's last visit in the transplant clinic - patient has been doing well in terms of transplant, taking transplant medications regularly, no episodes of rejection and no side effects of medications.      Patient continues to receive belatacept infusions  - due today  Stopped DMannose/Cranberry and Cranberry extra - Now on Dmannose only. Doing well, Heartburn improved and stable on Prilosec  Fever/cold/flu symptoms denies    BP: 118/84 today/ Home BP reported stable, checks daily  BG: wnl  Headache/Dizziness/Lightheaded: denies  Hand tremors: denies  Numbness/tingling: denies  Fevers/chills/sweats: denies  CP/SOB/palpatations: denies  Nausea/vomiting/heartburn: heartburn controlled  Diarrhea/constipation:  denies  UTI symptoms (burn/pain/itch/frequency/urgency/odor/color/foam): controlled  No visible or palpable edema     Appetite good; reports adequate hydration. 2+L/day      Patient was found to have reflux nephropathy at age 41 and was on peritoneal dialysis briefly before receiving a living related renal transplant from mother. She had some thrombotic complication and underwent nephrectomy in 1997. She started dialysis and had a second kidney transplant at age 41 in 51. 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 She received a 3rd kidney transplant in 11/2002 which failed in 2007 due to chronic rejection.    She has decreased vision both eyes.        - Post-transplant course complicated by delayed graft function.     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    Review of Systems:     Fever or chills: negative  Sore throat: negative   Fatigue/malaise: negative   Weight loss or gain: negative   New skin rash/lump or bump: negative   Problems with teeth/gums: negative   Chest pain: negative   Cough or shortness of breath: negative   Swelling: negative   Abdominal pain/heartburn/nausea/vomiting or diarrhea: negative   Pain or bleeding when urinating: negative   Twitching/numbness or weakness: negative     Physical Exam:     BP 118/84 (BP Site: R Arm, BP Position: Sitting, BP Cuff Size: Large)  - Pulse 96  - Temp 36.2 ??C (97.1 ??F) (Temporal)  - Wt 100.2 kg (221 lb)  - BMI 37.92 kg/m??     Nursing note and vitals reviewed.   Constitutional: Oriented to person, place, and time. Appears well-developed and well-nourished. No distress.   Head: Normocephalic and atraumatic.   Eyes: Right eye exhibits no discharge. Left eye exhibits no discharge. No scleral icterus.   Neck: Normal range of motion. Neck supple.   Cardiovascular: Normal rate and regular rhythm. Exam reveals no gallop and no friction rub. No murmur heard.   Pulmonary/Chest: Effort normal and breath sounds normal. No respiratory distress.   Abdominal: Soft. Non tender  Musculoskeletal: Normal range of motion. No edema and no tenderness.   Neurological: Alert and oriented to person, place, and time.       Renal Transplant History:    Race:  Caucasian   Age of recipient (at time of transplant): 41 y.o. y.o.   Cause of kidney disease: Reflux nephropathy   Native biopsy: No    Date of transplant: 12/05/18   Type of transplant: KDPI - 39%    - Donor creatinine: 1.3    - Any co morbidities: No    - Infection in donor: No    - HLA Mismatch: N/A    - Ischemia time: 6h 73m    - Crossmatch: negative    - Donor kidney biopsy: No diagnostic abnormalities identified (12/05/18)     Induction: Campath - alemtuzumab   Maintenance IS at the time of transplant: tacrolimus, mycophenolate   DGF: Yes   Diabetes Onset after transplant: No    Allergies:   Allergies   Allergen Reactions    Ancef [Cefazolin] Shortness Of Breath    Cephalosporins Shortness Of Breath    Adhesive Tape-Silicones      Other reaction(s): Other (See Comments)  Uncoded  Allergy. Allergen: plastic tape, Other Reaction: blistering    Other      Other reaction(s): Other (See Comments)  Uncoded Allergy. Allergen: plastic tape, Other Reaction: blistering    Demerol [Meperidine] Nausea And Vomiting    Pravastatin      Other reaction(s): Other (See Comments)  Muscle Twitching        Current Medications:   Current Outpatient Medications   Medication Sig Dispense Refill    acetaminophen (TYLENOL) 500 MG tablet Take 2 tablets (1,000 mg total) by mouth every six (6) hours as needed for pain. 100 tablet 0    ascorbic acid, vitamin C, (VITAMIN C) 500 MG tablet Take 1 tablet (500 mg total) by mouth daily.      aspirin (ECOTRIN) 81 MG tablet Take 1 tablet (81 mg total) by mouth daily. 30 tablet 0    calcitriol (ROCALTROL) 0.25 MCG capsule Take 1 capsule (0.25 mcg total) by mouth daily. 30 capsule 11    carboxymethylcellulose sodium (REFRESH CELLUVISC) 1 % DpGe Instill drops in affected eye(s) as directed as needed.      chlorthalidone (HYGROTON) 25 MG tablet TAKE 1 TABLET (25 MG TOTAL) BY MOUTH DAILY. 90 tablet 3    cranberry 500 mg cap Take 500 mg by mouth in the morning.      cranberry-vitamin C-mannose 250-30-50 mg Chew Chew 1 tablet  in the morning. 90 tablet 1    ezetimibe (ZETIA) 10 mg tablet Take 1 tablet (10 mg total) by mouth daily. 90 tablet 3    ferrous sulfate 325 (65 FE) MG tablet Take 1 tablet (325 mg total) by mouth in the morning.      mycophenolate (CELLCEPT) 500 mg tablet Take 2 tablets (1,000 mg total) by mouth Two (2) times a day. 120 tablet 11    omega-3 acid ethyl esters (LOVAZA) 1 gram capsule Take 1 capsule (1 g total) by mouth Two (2) times a day. E78.1 hypertriglyceridemia 180 capsule 3    omeprazole (PRILOSEC) 40 MG capsule Take 1 capsule (40 mg total) by mouth daily. 30 capsule 0    predniSONE (DELTASONE) 2.5 MG tablet Take 2 tablets (5 mg total) by mouth daily ALTERNATING with 1 tablet (2.5mg )daily 60 tablet 11    sertraline (ZOLOFT) 100 MG tablet TAKE 1 TABLET BY MOUTH EVERY DAY 90 tablet 3     No current facility-administered medications for this visit.       Past Medical History:   Past Medical History:   Diagnosis Date    Bell's palsy     Disease of thyroid gland     ESRD (end stage renal disease) (CMS-HCC)     Hypertriglyceridemia     Lymphedema     left arm    Nonarteritic ischemic optic neuropathy     Reflux nephropathy         Laboratory studies:     Recent Results (from the past 170 hour(s))   Urine Culture    Collection Time: 07/01/22  1:45 PM    Specimen: Clean Catch; Urine   Result Value Ref Range    Urine Culture, Comprehensive Mixed Urogenital Flora    Protein/Creatinine Ratio, Urine    Collection Time: 07/01/22  1:45 PM   Result Value Ref Range    Creat U 78.3 Undefined mg/dL    Protein, Ur 57.8 Undefined mg/dL    Protein/Creatinine Ratio, Urine 0.262 Undefined   Urinalysis with Microscopy    Collection Time: 07/01/22  1:45 PM   Result Value  Ref Range    Color, UA Yellow     Clarity, UA Clear     Specific Gravity, UA 1.015 1.005 - 1.030    pH, UA 6.5 5.0 - 9.0    Leukocyte Esterase, UA Negative Negative    Nitrite, UA Negative Negative    Protein, UA Trace (A) Negative    Glucose, UA Negative Negative    Ketones, UA Negative Negative    Urobilinogen, UA 0.2 mg/dL 0.2 - 2.0 mg/dL    Bilirubin, UA Negative Negative    Blood, UA Moderate (A) Negative    RBC, UA 50 (H) <4 /HPF    WBC, UA 5 0 - 5 /HPF WBC Clumps Rare (A) None Seen /HPF    Squam Epithel, UA 1 0 - 5 /HPF    Bacteria, UA Rare (A) None Seen /HPF   Iron & TIBC    Collection Time: 07/01/22  2:37 PM   Result Value Ref Range    Iron 14 (L) 50 - 170 ug/dL    TIBC 098 119 - 147 ug/dL    Iron Saturation (%) 4 (L) 20 - 55 %   Ferritin    Collection Time: 07/01/22  2:37 PM   Result Value Ref Range    Ferritin 14.3 7.3 - 270.7 ng/mL   Hemoglobin A1c    Collection Time: 07/01/22  2:37 PM   Result Value Ref Range    Hemoglobin A1C 5.6 4.8 - 5.6 %    Estimated Average Glucose 114 mg/dL   Vitamin D 25 hydroxy    Collection Time: 07/01/22  2:37 PM   Result Value Ref Range    Vitamin D Total (25OH) 31.6 20.0 - 80.0 ng/mL   Vitamin D 1,25 dihydroxy    Collection Time: 07/01/22  2:37 PM   Result Value Ref Range    Vit D, 1,25-Dihydroxy 44 18 - 78 pg/mL   HLA DSA Post Transplant    Collection Time: 07/01/22  2:37 PM   Result Value Ref Range    Donor ID [id number]     Donor HLA-A Antigen #1 A2     Anti-Donor HLA-A #1 MFI 0 <1000 MFI    Donor HLA-A Antigen #2 A33     Anti-Donor HLA-A #2 MFI 0 <1000 MFI    Donor HLA-B Antigen #1 B7     Anti-Donor HLA-B #1 MFI 456 <1000 MFI    Donor HLA-B Antigen #2 B65     Anti-Donor HLA-B #2 MFI 0 <1000 MFI    Donor HLA-C Antigen #1 C7     Anti-Donor HLA-C #1 MFI 0 <1000 MFI    Donor HLA-C Antigen #2 C8     Donor HLA-DR Antigen #1 DR17     Anti-Donor HLA-DR #1 MFI 0 <1000 MFI    Donor HLA-DR Antigen #2 DR15     Anti-Donor HLA-DR #2 MFI 0 <1000 MFI    Donor DRw Antigen #1 DR52     Anti-Donor DRw #1 MFI 0 <1000 MFI    Donor DRw Antigen #2 DR51     Anti-Donor DRw #2 MFI 0 <1000 MFI    Donor HLA-DQB Antigen #1 DQ2     Anti-Donor HLA-DQB #1 MFI 0 <1000 MFI    Donor HLA-DQB Antigen #2 DQ6     Anti-Donor HLA-DQB #2 MFI 0 <1000 MFI    Donor HLA-DP Antigen #1 DPB02:01     Anti-Donor HLA-DP Ag #1 MFI 0 <1000 MFI    Donor HLA-DP Antigen #2 DPB14:01  Anti-Donor HLA-DP #2 MFI 0 <1000 MFI    DSA Comment     FSAB Class 1 Antibody Specificity Collection Time: 07/01/22  2:37 PM   Result Value Ref Range    HLA Class 1 Antibody Result Positive     HLA Class 1 Antibodies Identified       A:23, 24, 29  B:8, 13, 27, 35, 38, 39, 41, 42, 44, 45, 47, 48, 49, 50, 53, 54, 57, 58, 59, 60, 61, 67, 73, 76, 81, 82  Cw:2, 5, 6, 15, 17, 18    HLA Class 1 Antibody Comment     FSAB Class 2 Antibody Specificity    Collection Time: 07/01/22  2:37 PM   Result Value Ref Range    HLA Class 2 Antibody Result Negative     HLA Class 2 Antibody Comment     BK Virus Quantitative PCR, Blood    Collection Time: 07/01/22  2:37 PM   Result Value Ref Range    BK Blood Result Not Detected Not Detected    BK Blood Quant      BK Blood Log(10)     Comprehensive Metabolic Panel    Collection Time: 07/01/22  2:37 PM   Result Value Ref Range    Sodium 137 135 - 145 mmol/L    Potassium 3.5 3.4 - 4.8 mmol/L    Chloride 101 98 - 107 mmol/L    CO2 25.5 20.0 - 31.0 mmol/L    Anion Gap 11 5 - 14 mmol/L    BUN 15 9 - 23 mg/dL    Creatinine 1.61 (H) 0.60 - 0.80 mg/dL    BUN/Creatinine Ratio 12     eGFR CKD-EPI (2021) Female 54 (L) >=60 mL/min/1.42m2    Glucose 120 70 - 179 mg/dL    Calcium 9.8 8.7 - 09.6 mg/dL    Albumin 4.2 3.4 - 5.0 g/dL    Total Protein 6.7 5.7 - 8.2 g/dL    Total Bilirubin 0.2 (L) 0.3 - 1.2 mg/dL    AST 21 <=04 U/L    ALT 35 10 - 49 U/L    Alkaline Phosphatase 79 46 - 116 U/L   Phosphorus Level    Collection Time: 07/01/22  2:37 PM   Result Value Ref Range    Phosphorus     Magnesium Level    Collection Time: 07/01/22  2:37 PM   Result Value Ref Range    Magnesium 1.6 1.6 - 2.6 mg/dL   CMV DNA, quantitative, PCR    Collection Time: 07/01/22  2:37 PM   Result Value Ref Range    CMV Viral Ld Detected (A) Not Detected    CMV Quant <50 (H) <0 IU/mL    CMV Quant Log10     CBC w/ Differential    Collection Time: 07/01/22  2:37 PM   Result Value Ref Range    WBC 9.4 3.6 - 11.2 10*9/L    RBC 4.41 3.95 - 5.13 10*12/L    HGB 11.8 11.3 - 14.9 g/dL    HCT 54.0 98.1 - 19.1 %    MCV 79.3 77.6 - 95.7 fL    MCH 26.8 25.9 - 32.4 pg    MCHC 33.8 32.0 - 36.0 g/dL    RDW 47.8 29.5 - 62.1 %    MPV 6.5 (L) 6.8 - 10.7 fL    Platelet 377 150 - 450 10*9/L    Neutrophils % 75.1 %    Lymphocytes % 16.6 %  Monocytes % 6.0 %    Eosinophils % 1.2 %    Basophils % 1.1 %    Absolute Neutrophils 7.1 1.8 - 7.8 10*9/L    Absolute Lymphocytes 1.6 1.1 - 3.6 10*9/L    Absolute Monocytes 0.6 0.3 - 0.8 10*9/L    Absolute Eosinophils 0.1 0.0 - 0.5 10*9/L    Absolute Basophils 0.1 0.0 - 0.1 10*9/L

## 2022-07-12 DIAGNOSIS — D849 Immunodeficiency, unspecified: Principal | ICD-10-CM

## 2022-07-12 DIAGNOSIS — Z940 Kidney transplant status: Principal | ICD-10-CM

## 2022-07-12 DIAGNOSIS — N185 Chronic kidney disease, stage 5: Principal | ICD-10-CM

## 2022-07-12 DIAGNOSIS — Z1159 Encounter for screening for other viral diseases: Principal | ICD-10-CM

## 2022-07-12 DIAGNOSIS — D631 Anemia in chronic kidney disease: Principal | ICD-10-CM

## 2022-07-12 NOTE — Unmapped (Signed)
[facility] Clinical Assessment & Refill Coordination Note    Kimberly [name], DOB: 26-Feb-1981  Phone: [phone] (home)     All above HIPAA information was verified with patient.     Was a Nurse, learning disability used for this call? No    Specialty Medication(s):   Transplant: mycophenolate mofetil 500mg  and Prednisone 2.5mg      Current Outpatient Medications   Medication Sig Dispense Refill   ??? acetaminophen (TYLENOL) 500 MG tablet Take 2 tablets (1,000 mg total) by mouth every six (6) hours as needed for pain. 100 tablet 0   ??? ascorbic acid, vitamin C, (VITAMIN C) 500 MG tablet Take 1 tablet (500 mg total) by mouth daily.     ??? aspirin (ECOTRIN) 81 MG tablet Take 1 tablet (81 mg total) by mouth daily. 30 tablet 0   ??? calcitriol (ROCALTROL) 0.25 MCG capsule Take 1 capsule (0.25 mcg total) by mouth daily. 30 capsule 11   ??? carboxymethylcellulose sodium (REFRESH CELLUVISC) 1 % DpGe Instill drops in affected eye(s) as directed as needed.     ??? chlorthalidone (HYGROTON) 25 MG tablet TAKE 1 TABLET (25 MG TOTAL) BY MOUTH DAILY. 90 tablet 3   ??? cranberry 500 mg cap Take 500 mg by mouth in the morning.     ??? cranberry-vitamin C-mannose 250-30-50 mg Chew Chew 1 tablet  in the morning. 90 tablet 1   ??? ferrous sulfate 325 (65 FE) MG tablet Take 1 tablet (325 mg total) by mouth in the morning.     ??? mycophenolate (CELLCEPT) 500 mg tablet Take 2 tablets (1,000 mg total) by mouth Two (2) times a day. 120 tablet 11   ??? omega-3 acid ethyl esters (LOVAZA) 1 gram capsule Take 1 capsule (1 g total) by mouth Two (2) times a day. E78.1 hypertriglyceridemia 180 capsule 3   ??? omeprazole (PRILOSEC) 40 MG capsule Take 1 capsule (40 mg total) by mouth daily. 30 capsule 0   ??? predniSONE (DELTASONE) 2.5 MG tablet Take 2 tablets (5 mg total) by mouth daily ALTERNATING with 1 tablet (2.5mg )daily 60 tablet 11   ??? sertraline (ZOLOFT) 100 MG tablet TAKE 1 TABLET BY MOUTH EVERY DAY 90 tablet 3     No current facility-administered medications for this visit.        Changes to medications: Kylynn reports no changes at this time.    Allergies   Allergen Reactions   ??? Ancef [Cefazolin] Shortness Of Breath   ??? Cephalosporins Shortness Of Breath   ??? Adhesive Tape-Silicones      Other reaction(s): Other (See Comments)  Uncoded Allergy. Allergen: plastic tape, Other Reaction: blistering   ??? Other      Other reaction(s): Other (See Comments)  Uncoded Allergy. Allergen: plastic tape, Other Reaction: blistering   ??? Demerol [Meperidine] Nausea And Vomiting   ??? Pravastatin      Other reaction(s): Other (See Comments)  Muscle Twitching       Changes to allergies: No    SPECIALTY MEDICATION ADHERENCE     Mycophenolate 500mg   : 10 days of medicine on hand   Prednisone 2.5mg   : 18 days of medicine on hand       Medication Adherence    Patient reported X missed doses in the last month: 0  Specialty Medication: mycophenolate 500mg   Patient is on additional specialty medications: Yes  Additional Specialty Medications: Prednisone 2.5mg   Patient Reported Additional Medication X Missed Doses in the Last Month: 0  Specialty medication(s) dose(s) confirmed: Regimen is correct and unchanged.     Are there any concerns with adherence? No    Adherence counseling provided? Not needed    CLINICAL MANAGEMENT AND INTERVENTION      Clinical Benefit Assessment:    Do you feel the medicine is effective or helping your condition? Yes    Clinical Benefit counseling provided? Not needed    Adverse Effects Assessment:    Are you experiencing any side effects? No    Are you experiencing difficulty administering your medicine? No    Quality of Life Assessment:         How many days over the past month did your transplant  keep you from your normal activities? For example, brushing your teeth or getting up in the morning. 0    Have you discussed this with your provider? Not needed    Acute Infection Status:    Acute infections noted within Epic:  No active infections  Patient reported infection: None    Therapy Appropriateness:    Is therapy appropriate and patient progressing towards therapeutic goals? Yes, therapy is appropriate and should be continued    DISEASE/MEDICATION-SPECIFIC INFORMATION      N/A    PATIENT SPECIFIC NEEDS     - Does the patient have any physical, cognitive, or cultural barriers? No    - Is the patient high risk? Yes, patient is taking a REMS drug. Medication is dispensed in compliance with REMS program    - Does the patient require a Care Management Plan? No     SOCIAL DETERMINANTS OF HEALTH     At the [facility], we have learned that life circumstances - like trouble affording food, housing, utilities, or transportation can affect the health of many of our patients.   That is why we wanted to ask: are you currently experiencing any life circumstances that are negatively impacting your health and/or quality of life? No    Social Determinants of Health     Financial Resource Strain: Low Risk  (05/07/2020)    Overall Financial Resource Strain (CARDIA)    ??? Difficulty of Paying Living Expenses: Not hard at all   Internet Connectivity: Not on file   Food Insecurity: No Food Insecurity (05/07/2020)    Hunger Vital Sign    ??? Worried About Running Out of Food in the Last Year: Never true    ??? Ran Out of Food in the Last Year: Never true   Tobacco Use: Low Risk  (07/01/2022)    Patient History    ??? Smoking Tobacco Use: Never    ??? Smokeless Tobacco Use: Never    ??? Passive Exposure: Never   Housing/Utilities: Low Risk  (05/07/2020)    Housing/Utilities    ??? Within the past 12 months, have you ever stayed: outside, in a car, in a tent, in an overnight shelter, or temporarily in someone else's home (i.e. couch-surfing)?: No    ??? Are you worried about losing your housing?: No    ??? Within the past 12 months, have you been unable to get utilities (heat, electricity) when it was really needed?: No   Alcohol Use: Not on file   Transportation Needs: No Transportation Needs (05/07/2020)    PRAPARE - Transportation    ??? Lack of Transportation (Medical): No    ??? Lack of Transportation (Non-Medical): No   Substance Use: Not on file   Health Literacy: Not on file   Physical Activity: Not  on file   Interpersonal Safety: Not on file   Stress: Not on file   Intimate Partner Violence: Not on file   Depression: Not on file   Social Connections: Not on file       Would you be willing to receive help with any of the needs that you have identified today? Not applicable       SHIPPING     Specialty Medication(s) to be Shipped:   Transplant: mycophenolate mofetil 500mg     Other medication(s) to be shipped: calcitriol   Call set up for 1.5 weeks out for other meds     Changes to insurance: No    Delivery Scheduled: Yes, Expected medication delivery date: 07/19/2022.     Medication will be delivered via UPS to the confirmed prescription address in [facility].    The patient will receive a drug information handout for each medication shipped and additional FDA Medication Guides as required.  Verified that patient has previously received a Conservation officer, historic buildings and a Surveyor, mining.    The patient or caregiver noted above participated in the development of this care plan and knows that they can request review of or adjustments to the care plan at any time.      All of the patient's questions and concerns have been addressed.    Thad Ranger   [facility] Specialty Pharmacist

## 2022-07-12 NOTE — Unmapped (Signed)
Iron sat levels 4%. Reviewed with Dr Christy Sartorius and ordered [facility] therapy plan. Sent to Dr Judie Petit for signature. Also placed GI referral. And patient has GI phone number to ensure scheduling happens promptly.

## 2022-07-19 MED FILL — MYCOPHENOLATE MOFETIL 500 MG TABLET: ORAL | 30 days supply | Qty: 120 | Fill #7

## 2022-07-19 MED FILL — CALCITRIOL 0.25 MCG CAPSULE: ORAL | 30 days supply | Qty: 30 | Fill #7

## 2022-07-24 DIAGNOSIS — N185 Chronic kidney disease, stage 5: Principal | ICD-10-CM

## 2022-07-24 DIAGNOSIS — D631 Anemia in chronic kidney disease: Principal | ICD-10-CM

## 2022-07-29 ENCOUNTER — Ambulatory Visit: Admit: 2022-07-29 | Discharge: 2022-07-30 | Payer: MEDICARE

## 2022-07-29 LAB — CBC W/ AUTO DIFF
BASOPHILS ABSOLUTE COUNT: 0.1 10*9/L (ref 0.0–0.1)
BASOPHILS RELATIVE PERCENT: 1.3 %
EOSINOPHILS ABSOLUTE COUNT: 0.1 10*9/L (ref 0.0–0.5)
EOSINOPHILS RELATIVE PERCENT: 0.9 %
HEMATOCRIT: 33.2 % — ABNORMAL LOW (ref 34.0–44.0)
HEMOGLOBIN: 10.9 g/dL — ABNORMAL LOW (ref 11.3–14.9)
LYMPHOCYTES ABSOLUTE COUNT: 1.3 10*9/L (ref 1.1–3.6)
LYMPHOCYTES RELATIVE PERCENT: 15.3 %
MEAN CORPUSCULAR HEMOGLOBIN CONC: 32.9 g/dL (ref 32.0–36.0)
MEAN CORPUSCULAR HEMOGLOBIN: 25.9 pg (ref 25.9–32.4)
MEAN CORPUSCULAR VOLUME: 78.8 fL (ref 77.6–95.7)
MEAN PLATELET VOLUME: 7.1 fL (ref 6.8–10.7)
MONOCYTES ABSOLUTE COUNT: 0.5 10*9/L (ref 0.3–0.8)
MONOCYTES RELATIVE PERCENT: 6.2 %
NEUTROPHILS ABSOLUTE COUNT: 6.2 10*9/L (ref 1.8–7.8)
NEUTROPHILS RELATIVE PERCENT: 76.3 %
NUCLEATED RED BLOOD CELLS: 0 /100{WBCs} (ref <=4)
PLATELET COUNT: 377 10*9/L (ref 150–450)
RED BLOOD CELL COUNT: 4.22 10*12/L (ref 3.95–5.13)
RED CELL DISTRIBUTION WIDTH: 14.6 % (ref 12.2–15.2)
WBC ADJUSTED: 8.2 10*9/L (ref 3.6–11.2)

## 2022-07-29 LAB — COMPREHENSIVE METABOLIC PANEL
ALBUMIN: 4.2 g/dL (ref 3.4–5.0)
ALKALINE PHOSPHATASE: 69 U/L (ref 46–116)
ALT (SGPT): 40 U/L (ref 10–49)
ANION GAP: 9 mmol/L (ref 5–14)
AST (SGOT): 22 U/L (ref <=34)
BILIRUBIN TOTAL: 0.3 mg/dL (ref 0.3–1.2)
BLOOD UREA NITROGEN: 16 mg/dL (ref 9–23)
BUN / CREAT RATIO: 12
CALCIUM: 10 mg/dL (ref 8.7–10.4)
CHLORIDE: 100 mmol/L (ref 98–107)
CO2: 27 mmol/L (ref 20.0–31.0)
CREATININE: 1.29 mg/dL — ABNORMAL HIGH
EGFR CKD-EPI (2021) FEMALE: 53 mL/min/{1.73_m2} — ABNORMAL LOW (ref >=60)
GLUCOSE RANDOM: 94 mg/dL (ref 70–179)
POTASSIUM: 3.5 mmol/L (ref 3.4–4.8)
PROTEIN TOTAL: 6.9 g/dL (ref 5.7–8.2)
SODIUM: 136 mmol/L (ref 135–145)

## 2022-07-29 LAB — URINALYSIS WITH MICROSCOPY
BILIRUBIN UA: NEGATIVE
BLOOD UA: NEGATIVE
GLUCOSE UA: NEGATIVE
KETONES UA: NEGATIVE
LEUKOCYTE ESTERASE UA: NEGATIVE
NITRITE UA: NEGATIVE
PH UA: 6.5 (ref 5.0–9.0)
PROTEIN UA: NEGATIVE
RBC UA: 2 /HPF (ref <4)
SPECIFIC GRAVITY UA: 1.01 (ref 1.005–1.030)
SQUAMOUS EPITHELIAL: <1 /HPF (ref 0–5)
UROBILINOGEN UA: 0.2
WBC UA: 3 /HPF (ref 0–5)

## 2022-07-29 LAB — FERRITIN: FERRITIN: 18.4 ng/mL

## 2022-07-29 LAB — IRON PANEL
IRON SATURATION: 5 % — ABNORMAL LOW (ref 20–55)
IRON: 19 ug/dL — ABNORMAL LOW
TOTAL IRON BINDING CAPACITY: 372 ug/dL (ref 250–425)

## 2022-07-29 LAB — PHOSPHORUS: PHOSPHORUS: 2.6 mg/dL (ref 2.4–5.1)

## 2022-07-29 LAB — MAGNESIUM: MAGNESIUM: 1.6 mg/dL (ref 1.6–2.6)

## 2022-07-29 MED ADMIN — belatacept (NULOJIX) 512.5 mg in sodium chloride (NS) 0.9 % 100 mL IVPB: 5 mg/kg | INTRAVENOUS | @ 19:00:00 | Stop: 2022-07-29

## 2022-07-29 NOTE — Unmapped (Signed)
1430 Patient presents for Benlysta infusion in no acute distress.  Reports no new medical issues and no recent infections.  IV started and labs drawn.    1456 Benlysta  512.5 mg started to infuse at 200  ml/hr.  1526 Benlysta infusion complete.  Patient tolerated without complication.  NS flush complete.  IV d/c'd.  Patient discharged from Infusion Center.

## 2022-08-01 LAB — CMV DNA, QUANTITATIVE, PCR: CMV VIRAL LD: NOT DETECTED

## 2022-08-02 NOTE — Unmapped (Signed)
[facility] Refill Coordination Note    Specialty Medication(s) to be Shipped:   Transplant: Prednisone 2.5mg     Other medication(s) to be shipped: No additional medications requested for fill at this time     Kimberly [name], DOB: 26-Jun-1981  Phone: [phone] (home)       All above HIPAA information was verified with patient.     Was a Nurse, learning disability used for this call? No    Completed refill call assessment today to schedule patient's medication shipment from the [facility] [phone]).  All relevant notes have been reviewed.     Specialty medication(s) and dose(s) confirmed: Regimen is correct and unchanged.   Changes to medications: Annaleece reports no changes at this time.  Changes to insurance: No  New side effects reported not previously addressed with a pharmacist or physician: None reported  Questions for the pharmacist: No    Confirmed patient received a Conservation officer, historic buildings and a Surveyor, mining with first shipment. The patient will receive a drug information handout for each medication shipped and additional FDA Medication Guides as required.       DISEASE/MEDICATION-SPECIFIC INFORMATION        N/A    SPECIALTY MEDICATION ADHERENCE     Medication Adherence    Patient reported X missed doses in the last month: 0  Specialty Medication: predniSONE 2.5 MG tablet (DELTASONE)  Patient is on additional specialty medications: No  Patient is on more than two specialty medications: No                                Were doses missed due to medication being on hold? No    predniSONE 2.5 mg: 10 days of medicine on hand       REFERRAL TO PHARMACIST     Referral to the pharmacist: Not needed      [facility]     Shipping address confirmed in Epic.     Delivery Scheduled: Yes, Expected medication delivery date: 08/10/22.     Medication will be delivered via UPS to the prescription address in Epic WAM.    Kimberly [name]   [facility] Specialty Technician

## 2022-08-09 MED FILL — PREDNISONE 2.5 MG TABLET: ORAL | 40 days supply | Qty: 60 | Fill #6

## 2022-08-17 NOTE — Unmapped (Signed)
[facility] Refill Coordination Note    Specialty Medication(s) to be Shipped:   Transplant: mycophenolate mofetil 500mg     Other medication(s) to be shipped:  calcitriol 0.25 mcg     Kimberly [name], DOB: 14-Jan-1981  Phone: [phone] (home)       All above HIPAA information was verified with patient.     Was a Nurse, learning disability used for this call? No    Completed refill call assessment today to schedule patient's medication shipment from the [facility] [phone]).  All relevant notes have been reviewed.     Specialty medication(s) and dose(s) confirmed: Regimen is correct and unchanged.   Changes to medications: Dary reports no changes at this time.  Changes to insurance: No  New side effects reported not previously addressed with a pharmacist or physician: None reported  Questions for the pharmacist: No    Confirmed patient received a Conservation officer, historic buildings and a Surveyor, mining with first shipment. The patient will receive a drug information handout for each medication shipped and additional FDA Medication Guides as required.       DISEASE/MEDICATION-SPECIFIC INFORMATION        N/A    SPECIALTY MEDICATION ADHERENCE     Medication Adherence    Patient reported X missed doses in the last month: 0  Specialty Medication: mycophenolate 500 mg tablet (CELLCEPT)  Patient is on additional specialty medications: No  Patient is on more than two specialty medications: No                                Were doses missed due to medication being on hold? No    mycophenolate 500  mg: 8 days of medicine on hand       REFERRAL TO PHARMACIST     Referral to the pharmacist: Not needed      [facility]     Shipping address confirmed in Epic.     Delivery Scheduled: Yes, Expected medication delivery date: 08/22/22.     Medication will be delivered via UPS to the prescription address in Epic WAM.    [name]   [facility] Specialty Technician

## 2022-08-19 MED FILL — CALCITRIOL 0.25 MCG CAPSULE: ORAL | 30 days supply | Qty: 30 | Fill #8

## 2022-08-19 MED FILL — MYCOPHENOLATE MOFETIL 500 MG TABLET: ORAL | 30 days supply | Qty: 120 | Fill #8

## 2022-08-26 ENCOUNTER — Ambulatory Visit: Admit: 2022-08-26 | Discharge: 2022-08-26 | Payer: MEDICARE

## 2022-08-26 DIAGNOSIS — D631 Anemia in chronic kidney disease: Principal | ICD-10-CM

## 2022-08-26 DIAGNOSIS — N185 Chronic kidney disease, stage 5: Principal | ICD-10-CM

## 2022-08-26 LAB — COMPREHENSIVE METABOLIC PANEL
ALBUMIN: 4.3 g/dL (ref 3.4–5.0)
ALKALINE PHOSPHATASE: 81 U/L (ref 46–116)
ALT (SGPT): 36 U/L (ref 10–49)
ANION GAP: 10 mmol/L (ref 5–14)
AST (SGOT): 20 U/L (ref <=34)
BILIRUBIN TOTAL: 0.3 mg/dL (ref 0.3–1.2)
BLOOD UREA NITROGEN: 18 mg/dL (ref 9–23)
BUN / CREAT RATIO: 14
CALCIUM: 10.3 mg/dL (ref 8.7–10.4)
CHLORIDE: 100 mmol/L (ref 98–107)
CO2: 27.5 mmol/L (ref 20.0–31.0)
CREATININE: 1.29 mg/dL — ABNORMAL HIGH
EGFR CKD-EPI (2021) FEMALE: 53 mL/min/{1.73_m2} — ABNORMAL LOW (ref >=60)
GLUCOSE RANDOM: 89 mg/dL (ref 70–179)
POTASSIUM: 3.5 mmol/L (ref 3.4–4.8)
PROTEIN TOTAL: 7.3 g/dL (ref 5.7–8.2)
SODIUM: 137 mmol/L (ref 135–145)

## 2022-08-26 LAB — CBC W/ AUTO DIFF
BASOPHILS ABSOLUTE COUNT: 0.1 10*9/L (ref 0.0–0.1)
BASOPHILS RELATIVE PERCENT: 0.9 %
EOSINOPHILS ABSOLUTE COUNT: 0 10*9/L (ref 0.0–0.5)
EOSINOPHILS RELATIVE PERCENT: 0.5 %
HEMATOCRIT: 31.4 % — ABNORMAL LOW (ref 34.0–44.0)
HEMOGLOBIN: 10.4 g/dL — ABNORMAL LOW (ref 11.3–14.9)
LYMPHOCYTES ABSOLUTE COUNT: 1.2 10*9/L (ref 1.1–3.6)
LYMPHOCYTES RELATIVE PERCENT: 12.7 %
MEAN CORPUSCULAR HEMOGLOBIN CONC: 33.3 g/dL (ref 32.0–36.0)
MEAN CORPUSCULAR HEMOGLOBIN: 25 pg — ABNORMAL LOW (ref 25.9–32.4)
MEAN CORPUSCULAR VOLUME: 75.1 fL — ABNORMAL LOW (ref 77.6–95.7)
MEAN PLATELET VOLUME: 6.7 fL — ABNORMAL LOW (ref 6.8–10.7)
MONOCYTES ABSOLUTE COUNT: 0.4 10*9/L (ref 0.3–0.8)
MONOCYTES RELATIVE PERCENT: 4.6 %
NEUTROPHILS ABSOLUTE COUNT: 7.8 10*9/L (ref 1.8–7.8)
NEUTROPHILS RELATIVE PERCENT: 81.3 %
NUCLEATED RED BLOOD CELLS: 0 /100{WBCs} (ref <=4)
PLATELET COUNT: 448 10*9/L (ref 150–450)
RED BLOOD CELL COUNT: 4.18 10*12/L (ref 3.95–5.13)
RED CELL DISTRIBUTION WIDTH: 14.3 % (ref 12.2–15.2)
WBC ADJUSTED: 9.5 10*9/L (ref 3.6–11.2)

## 2022-08-26 LAB — IRON PANEL
IRON SATURATION: 4 % — ABNORMAL LOW (ref 20–55)
IRON: 16 ug/dL — ABNORMAL LOW
TOTAL IRON BINDING CAPACITY: 394 ug/dL (ref 250–425)

## 2022-08-26 LAB — FERRITIN: FERRITIN: 7.7 ng/mL

## 2022-08-26 LAB — MAGNESIUM: MAGNESIUM: 1.6 mg/dL (ref 1.6–2.6)

## 2022-08-26 LAB — PHOSPHORUS: PHOSPHORUS: 2.1 mg/dL — ABNORMAL LOW (ref 2.4–5.1)

## 2022-08-26 MED ADMIN — cetirizine (ZYRTEC) tablet 10 mg: 10 mg | ORAL | @ 19:00:00 | Stop: 2022-08-26

## 2022-08-26 MED ADMIN — belatacept (NULOJIX) 500 mg in sodium chloride (NS) 0.9 % 100 mL IVPB: 5 mg/kg | INTRAVENOUS | @ 19:00:00 | Stop: 2022-08-26

## 2022-08-26 MED ADMIN — ferumoxytoL (FERAHEME) 510 mg in sodium chloride (NS) 0.9 % 100 mL IVPB: 510 mg | INTRAVENOUS | @ 20:00:00 | Stop: 2022-08-26

## 2022-08-26 MED ADMIN — acetaminophen (TYLENOL) tablet 650 mg: 650 mg | ORAL | @ 19:00:00 | Stop: 2022-08-26

## 2022-08-26 MED ADMIN — sodium chloride (NS) 0.9 % infusion: 20 mL/h | INTRAVENOUS | @ 19:00:00 | Stop: 2022-08-26

## 2022-08-26 NOTE — Unmapped (Signed)
\  [phone]

## 2022-08-26 NOTE — Unmapped (Signed)
Pt to [facility] for infusion of Belatacept. Pt denies new complaints/ infections. VSS. IV started in right [facility], labs drawn, tubes labeled.     1457 Belatacept 500  mg  1526 Belatacept complete   VSS, no issues noted.     Pt aware of potential reaction/side effects of Feraheme infusion. Call bell within reach.    1530  Feraheme 510 mg started  1550 Infusion complete.      Pt tolerated without complication, VSS. IV flushed per policy. Pt remained in [facility] for 30 minutes post infusion for observation without any s/s of infusion reaction.     IV d/c'd, gauze and coban applied.  Pt left clinic in no acute distress.

## 2022-08-29 LAB — CMV DNA, QUANTITATIVE, PCR: CMV VIRAL LD: NOT DETECTED

## 2022-08-29 NOTE — Unmapped (Addendum)
I see she just got a feraheme infusion on 11/3 so she should get one again soon. We can plan to repeat iron studies in 6 weeks.

## 2022-09-04 DIAGNOSIS — Z79899 Other long term (current) drug therapy: Principal | ICD-10-CM

## 2022-09-04 DIAGNOSIS — Z940 Kidney transplant status: Principal | ICD-10-CM

## 2022-09-06 ENCOUNTER — Ambulatory Visit: Admit: 2022-09-06 | Discharge: 2022-09-07 | Payer: MEDICARE

## 2022-09-06 MED ADMIN — cetirizine (ZYRTEC) tablet 10 mg: 10 mg | ORAL | @ 19:00:00 | Stop: 2022-09-06

## 2022-09-06 MED ADMIN — ferumoxytoL (FERAHEME) 510 mg in sodium chloride (NS) 0.9 % 100 mL IVPB: 510 mg | INTRAVENOUS | @ 19:00:00 | Stop: 2022-09-06

## 2022-09-06 MED ADMIN — acetaminophen (TYLENOL) tablet 650 mg: 650 mg | ORAL | @ 19:00:00 | Stop: 2022-09-06

## 2022-09-06 NOTE — Unmapped (Signed)
Pt presents for Feraheme infusion, VSS.  IV placed in right AC, premeds administered.  Pt aware of potential reaction/side effects, call bell within reach.    1428 Feraheme 510 mg started  1446  Infusion complete.      Pt tolerated without complication, VSS. IV flushed per policy and d/c'd, gauze and coban applied.  Pt left clinic in no acute distress.

## 2022-09-13 NOTE — Unmapped (Signed)
[facility] Refill Coordination Note    Specialty Medication(s) to be Shipped:   Transplant: mycophenolate mofetil 500mg  and Prednisone 2.5mg     Other medication(s) to be shipped:  calcitriol      Kimberly [name], DOB: [birth year]  Phone: [phone] (home)       All above HIPAA information was verified with patient.     Was a Nurse, learning disability used for this call? No    Completed refill call assessment today to schedule patient's medication shipment from the [facility] [phone]).  All relevant notes have been reviewed.     Specialty medication(s) and dose(s) confirmed: Regimen is correct and unchanged.   Changes to medications: Troian reports no changes at this time.  Changes to insurance: No  New side effects reported not previously addressed with a pharmacist or physician: None reported  Questions for the pharmacist: No    Confirmed patient received a Conservation officer, historic buildings and a Surveyor, mining with first shipment. The patient will receive a drug information handout for each medication shipped and additional FDA Medication Guides as required.       DISEASE/MEDICATION-SPECIFIC INFORMATION        N/A    SPECIALTY MEDICATION ADHERENCE     Medication Adherence    Patient reported X missed doses in the last month: 0  Specialty Medication: mycophenolate 500 mg tablet (CELLCEPT)  Patient is on additional specialty medications: Yes  Additional Specialty Medications: predniSONE 2.5 MG tablet (DELTASONE)  Patient Reported Additional Medication X Missed Doses in the Last Month: 0  Patient is on more than two specialty medications: No                                Were doses missed due to medication being on hold? No    mycophenolate 500 mg: 9 days of medicine on hand   prednisone 2.5 mg: 9 days of medicine on hand       REFERRAL TO PHARMACIST     Referral to the pharmacist: Not needed      [facility]     Shipping address confirmed in Epic.     Delivery Scheduled: Yes, Expected medication delivery date: 09/20/22.     Medication will be delivered via UPS to the prescription address in Epic WAM.    Quintella Reichert   [facility] Specialty Technician

## 2022-09-19 MED FILL — MYCOPHENOLATE MOFETIL 500 MG TABLET: ORAL | 30 days supply | Qty: 120 | Fill #9

## 2022-09-19 MED FILL — CALCITRIOL 0.25 MCG CAPSULE: ORAL | 30 days supply | Qty: 30 | Fill #9

## 2022-09-19 MED FILL — PREDNISONE 2.5 MG TABLET: ORAL | 40 days supply | Qty: 60 | Fill #7

## 2022-09-23 ENCOUNTER — Ambulatory Visit: Admit: 2022-09-23 | Discharge: 2022-09-24 | Payer: MEDICARE

## 2022-09-23 DIAGNOSIS — Z79899 Other long term (current) drug therapy: Principal | ICD-10-CM

## 2022-09-23 DIAGNOSIS — Z940 Kidney transplant status: Principal | ICD-10-CM

## 2022-09-23 LAB — CBC W/ AUTO DIFF
BASOPHILS ABSOLUTE COUNT: 0.1 10*9/L (ref 0.0–0.1)
BASOPHILS RELATIVE PERCENT: 1.4 %
EOSINOPHILS ABSOLUTE COUNT: 0.1 10*9/L (ref 0.0–0.5)
EOSINOPHILS RELATIVE PERCENT: 0.8 %
HEMATOCRIT: 37 % (ref 34.0–44.0)
HEMOGLOBIN: 12.3 g/dL (ref 11.3–14.9)
LYMPHOCYTES ABSOLUTE COUNT: 1.3 10*9/L (ref 1.1–3.6)
LYMPHOCYTES RELATIVE PERCENT: 17.8 %
MEAN CORPUSCULAR HEMOGLOBIN CONC: 33.2 g/dL (ref 32.0–36.0)
MEAN CORPUSCULAR HEMOGLOBIN: 26.4 pg (ref 25.9–32.4)
MEAN CORPUSCULAR VOLUME: 79.4 fL (ref 77.6–95.7)
MEAN PLATELET VOLUME: 6.9 fL (ref 6.8–10.7)
MONOCYTES ABSOLUTE COUNT: 0.4 10*9/L (ref 0.3–0.8)
MONOCYTES RELATIVE PERCENT: 4.8 %
NEUTROPHILS ABSOLUTE COUNT: 5.5 10*9/L (ref 1.8–7.8)
NEUTROPHILS RELATIVE PERCENT: 75.2 %
NUCLEATED RED BLOOD CELLS: 0 /100{WBCs} (ref <=4)
PLATELET COUNT: 311 10*9/L (ref 150–450)
RED BLOOD CELL COUNT: 4.66 10*12/L (ref 3.95–5.13)
RED CELL DISTRIBUTION WIDTH: 20.9 % — ABNORMAL HIGH (ref 12.2–15.2)
WBC ADJUSTED: 7.3 10*9/L (ref 3.6–11.2)

## 2022-09-23 LAB — COMPREHENSIVE METABOLIC PANEL
ALBUMIN: 4.2 g/dL (ref 3.4–5.0)
ALKALINE PHOSPHATASE: 78 U/L (ref 46–116)
ALT (SGPT): 51 U/L — ABNORMAL HIGH (ref 10–49)
ANION GAP: 9 mmol/L (ref 5–14)
AST (SGOT): 30 U/L (ref <=34)
BILIRUBIN TOTAL: 0.3 mg/dL (ref 0.3–1.2)
BLOOD UREA NITROGEN: 16 mg/dL (ref 9–23)
BUN / CREAT RATIO: 14
CALCIUM: 10.2 mg/dL (ref 8.7–10.4)
CHLORIDE: 102 mmol/L (ref 98–107)
CO2: 26.7 mmol/L (ref 20.0–31.0)
CREATININE: 1.16 mg/dL — ABNORMAL HIGH
EGFR CKD-EPI (2021) FEMALE: 60 mL/min/{1.73_m2} (ref >=60)
GLUCOSE RANDOM: 103 mg/dL (ref 70–179)
POTASSIUM: 3.5 mmol/L (ref 3.4–4.8)
PROTEIN TOTAL: 7 g/dL (ref 5.7–8.2)
SODIUM: 138 mmol/L (ref 135–145)

## 2022-09-23 LAB — URINALYSIS WITH MICROSCOPY
BACTERIA: NONE SEEN /HPF
BILIRUBIN UA: NEGATIVE
GLUCOSE UA: NEGATIVE
KETONES UA: NEGATIVE
LEUKOCYTE ESTERASE UA: NEGATIVE
NITRITE UA: NEGATIVE
PH UA: 6.5 (ref 5.0–9.0)
RBC UA: >100 /HPF — ABNORMAL HIGH (ref <4)
SPECIFIC GRAVITY UA: 1.02 (ref 1.005–1.030)
SQUAMOUS EPITHELIAL: <1 /HPF (ref 0–5)
UROBILINOGEN UA: 0.2
WBC UA: <1 /HPF (ref 0–5)

## 2022-09-23 LAB — MAGNESIUM: MAGNESIUM: 1.6 mg/dL (ref 1.6–2.6)

## 2022-09-23 LAB — PHOSPHORUS: PHOSPHORUS: 2.3 mg/dL — ABNORMAL LOW (ref 2.4–5.1)

## 2022-09-23 LAB — SLIDE REVIEW

## 2022-09-23 MED ADMIN — belatacept (NULOJIX) 500 mg in sodium chloride (NS) 0.9 % 100 mL IVPB: 5 mg/kg | INTRAVENOUS | @ 21:00:00 | Stop: 2022-09-23

## 2022-09-23 NOTE — Unmapped (Signed)
[facility] Bel[facility]pt infusion, S/P Kidney Transplant.  Patient has received previous doses of Belatacept in the Transplant Clinic and tolerated them well. Patient denies any new concerns or questions, denies any changes to her medical history, medications and allergies reviewed by Clinical research associate. 24G PIV placed to right arm, patient tolerated well, labs obtained per standing orders, no premeds ordered. Call light at patient's side, pt instructed on how to use.     1540 Belatacept 500 mg in 100 ml started per provider order     1614 Belatacept complete.      Patient tolerated infusion with no ill effects, PIV flushed with normal saline per infusion protocol, PIV D/C'd, gauze and coban applied. Vitals stable and comparable to baseline. Patient discharged from Infusion Center in stable condition with no acute distress. Next infusion appointment made for four weeks.

## 2022-09-25 DIAGNOSIS — N185 Chronic kidney disease, stage 5: Principal | ICD-10-CM

## 2022-09-25 DIAGNOSIS — D631 Anemia in chronic kidney disease: Principal | ICD-10-CM

## 2022-09-26 LAB — CMV DNA, QUANTITATIVE, PCR: CMV VIRAL LD: NOT DETECTED

## 2022-09-27 DIAGNOSIS — Z940 Kidney transplant status: Principal | ICD-10-CM

## 2022-09-27 DIAGNOSIS — Z79899 Other long term (current) drug therapy: Principal | ICD-10-CM

## 2022-09-27 MED ORDER — EZETIMIBE 10 MG TABLET
ORAL_TABLET | Freq: Every day | ORAL | 3 refills | 90 days | Status: CP
Start: 2022-09-27 — End: 2023-09-27

## 2022-09-27 NOTE — Unmapped (Signed)
Followed up on recent urine sample results, patient is currently menstruating. Requested refill of ezetimibe, ok per Dr. Christy Sartorius. No other needs at this time.

## 2022-10-12 NOTE — Unmapped (Signed)
[facility] Pharm[facility] Refill Coordination Note    Specialty Medication(s) to be Shipped:   Transplant: mycophenolate mofetil 500mg     Other medication(s) to be shipped:  calcitriol 0.25 mcg     ANGILA LAPIETRA, DOB: May 14, 1981  Phone: [phone] (home)       All above HIPAA information was verified with patient.     Was a Nurse, learning disability used for this call? No    Completed refill call assessment today to schedule patient's medication shipment from the [facility] [phone]).  All relevant notes have been reviewed.     Specialty medication(s) and dose(s) confirmed: Regimen is correct and unchanged.   Changes to medications: Stacy reports no changes at this time.  Changes to insurance: No  New side effects reported not previously addressed with a pharmacist or physician: None reported  Questions for the pharmacist: No    Confirmed patient received a Conservation officer, historic buildings and a Surveyor, mining with first shipment. The patient will receive a drug information handout for each medication shipped and additional FDA Medication Guides as required.       DISEASE/MEDICATION-SPECIFIC INFORMATION        N/A    SPECIALTY MEDICATION ADHERENCE     Medication Adherence    Patient reported X missed doses in the last month: 0  Specialty Medication: mycophenolate 500 mg tablet (CELLCEPT)  Patient is on additional specialty medications: No  Patient is on more than two specialty medications: No                                Were doses missed due to medication being on hold? No    mycophenolate 500  mg: 12 days of medicine on hand       REFERRAL TO PHARMACIST     Referral to the pharmacist: Not needed      [facility]     Shipping address confirmed in Epic.     Delivery Scheduled: Yes, Expected medication delivery date: 10/20/22.     Medication will be delivered via UPS to the prescription address in Epic WAM.    [name]   [facility] Specialty Technician

## 2022-10-19 MED FILL — CALCITRIOL 0.25 MCG CAPSULE: ORAL | 30 days supply | Qty: 30 | Fill #10

## 2022-10-21 ENCOUNTER — Ambulatory Visit: Admit: 2022-10-21 | Discharge: 2022-10-22 | Payer: MEDICARE

## 2022-10-21 LAB — COMPREHENSIVE METABOLIC PANEL
ALBUMIN: 4.2 g/dL (ref 3.4–5.0)
ALKALINE PHOSPHATASE: 77 U/L (ref 46–116)
ALT (SGPT): 58 U/L — ABNORMAL HIGH (ref 10–49)
ANION GAP: 7 mmol/L (ref 5–14)
AST (SGOT): 35 U/L — ABNORMAL HIGH (ref <=34)
BILIRUBIN TOTAL: 0.4 mg/dL (ref 0.3–1.2)
BLOOD UREA NITROGEN: 16 mg/dL (ref 9–23)
BUN / CREAT RATIO: 13
CALCIUM: 10.3 mg/dL (ref 8.7–10.4)
CHLORIDE: 101 mmol/L (ref 98–107)
CO2: 28.4 mmol/L (ref 20.0–31.0)
CREATININE: 1.21 mg/dL — ABNORMAL HIGH
EGFR CKD-EPI (2021) FEMALE: 58 mL/min/{1.73_m2} — ABNORMAL LOW (ref >=60)
GLUCOSE RANDOM: 82 mg/dL (ref 70–179)
POTASSIUM: 3.9 mmol/L (ref 3.4–4.8)
PROTEIN TOTAL: 7 g/dL (ref 5.7–8.2)
SODIUM: 136 mmol/L (ref 135–145)

## 2022-10-21 LAB — CBC W/ AUTO DIFF
BASOPHILS ABSOLUTE COUNT: 0.1 10*9/L (ref 0.0–0.1)
BASOPHILS RELATIVE PERCENT: 0.8 %
EOSINOPHILS ABSOLUTE COUNT: 0.1 10*9/L (ref 0.0–0.5)
EOSINOPHILS RELATIVE PERCENT: 1.1 %
HEMATOCRIT: 36.6 % (ref 34.0–44.0)
HEMOGLOBIN: 11.9 g/dL (ref 11.3–14.9)
LYMPHOCYTES ABSOLUTE COUNT: 1.3 10*9/L (ref 1.1–3.6)
LYMPHOCYTES RELATIVE PERCENT: 17 %
MEAN CORPUSCULAR HEMOGLOBIN CONC: 32.6 g/dL (ref 32.0–36.0)
MEAN CORPUSCULAR HEMOGLOBIN: 26.5 pg (ref 25.9–32.4)
MEAN CORPUSCULAR VOLUME: 81.2 fL (ref 77.6–95.7)
MEAN PLATELET VOLUME: 6.3 fL — ABNORMAL LOW (ref 6.8–10.7)
MONOCYTES ABSOLUTE COUNT: 0.5 10*9/L (ref 0.3–0.8)
MONOCYTES RELATIVE PERCENT: 6.9 %
NEUTROPHILS ABSOLUTE COUNT: 5.7 10*9/L (ref 1.8–7.8)
NEUTROPHILS RELATIVE PERCENT: 74.2 %
PLATELET COUNT: 310 10*9/L (ref 150–450)
RED BLOOD CELL COUNT: 4.5 10*12/L (ref 3.95–5.13)
RED CELL DISTRIBUTION WIDTH: 20.4 % — ABNORMAL HIGH (ref 12.2–15.2)
WBC ADJUSTED: 7.7 10*9/L (ref 3.6–11.2)

## 2022-10-21 LAB — SLIDE REVIEW

## 2022-10-21 LAB — MAGNESIUM: MAGNESIUM: 1.7 mg/dL (ref 1.6–2.6)

## 2022-10-21 LAB — PHOSPHORUS: PHOSPHORUS: 2.3 mg/dL — ABNORMAL LOW (ref 2.4–5.1)

## 2022-10-21 MED ADMIN — belatacept (NULOJIX) 500 mg in sodium chloride (NS) 0.9 % 100 mL IVPB: 5 mg/kg | INTRAVENOUS | @ 20:00:00 | Stop: 2022-10-21

## 2022-10-21 MED FILL — MYCOPHENOLATE MOFETIL 500 MG TABLET: ORAL | 30 days supply | Qty: 120 | Fill #10

## 2022-10-21 NOTE — Unmapped (Signed)
[facility] for Belatacept infusion, S/P Kidney Transplant.  24G PIV placed to right forearm, patient tolerated well, labs obtained per standing orders, no premeds ordered. Call light at patient's side, pt instructed on how to use.     1452 Belatacept 500 mg in 100 ml started per provider order     1522 Belatacept complete.      Patient tolerated infusion with no ill effects, PIV flushed with normal saline per infusion protocol, PIV D/C'd, gauze and coban applied. Vitals stable and comparable to baseline. Patient discharged from Infusion Center in stable condition with no acute distress. Next infusion appointment made for four weeks.

## 2022-10-22 LAB — CMV DNA, QUANTITATIVE, PCR: CMV VIRAL LD: NOT DETECTED

## 2022-10-30 DIAGNOSIS — N185 Chronic kidney disease, stage 5: Principal | ICD-10-CM

## 2022-10-30 DIAGNOSIS — D631 Anemia in chronic kidney disease: Principal | ICD-10-CM

## 2022-11-02 NOTE — Unmapped (Signed)
[facility] Shared [facility] Clinical Assessment & Refill Coordination Note    Kimberly [name], DOB: 1981-02-23  Phone: [phone] (home)     All above HIPAA information was verified with patient.     Was a Nurse, learning disability used for this call? No    Specialty Medication(s):   Transplant: mycophenolate mofetil 500mg  and Prednisone 2.5mg      Current Outpatient Medications   Medication Sig Dispense Refill   ??? acetaminophen (TYLENOL) 500 MG tablet Take 2 tablets (1,000 mg total) by mouth every six (6) hours as needed for pain. 100 tablet 0   ??? ascorbic acid, vitamin C, (VITAMIN C) 500 MG tablet Take 1 tablet (500 mg total) by mouth daily.     ??? aspirin (ECOTRIN) 81 MG tablet Take 1 tablet (81 mg total) by mouth daily. 30 tablet 0   ??? calcitriol (ROCALTROL) 0.25 MCG capsule Take 1 capsule (0.25 mcg total) by mouth daily. 30 capsule 11   ??? carboxymethylcellulose sodium (REFRESH CELLUVISC) 1 % DpGe Instill drops in affected eye(s) as directed as needed.     ??? chlorthalidone (HYGROTON) 25 MG tablet TAKE 1 TABLET (25 MG TOTAL) BY MOUTH DAILY. 90 tablet 3   ??? cranberry 500 mg cap Take 500 mg by mouth in the morning.     ??? cranberry-vitamin C-mannose 250-30-50 mg Chew Chew 1 tablet  in the morning. 90 tablet 1   ??? ezetimibe (ZETIA) 10 mg tablet Take 1 tablet (10 mg total) by mouth daily. 90 tablet 3   ??? ferrous sulfate 325 (65 FE) MG tablet Take 1 tablet (325 mg total) by mouth in the morning.     ??? mycophenolate (CELLCEPT) 500 mg tablet Take 2 tablets (1,000 mg total) by mouth Two (2) times a day. 120 tablet 11   ??? omega-3 acid ethyl esters (LOVAZA) 1 gram capsule Take 1 capsule (1 g total) by mouth Two (2) times a day. E78.1 hypertriglyceridemia 180 capsule 3   ??? omeprazole (PRILOSEC) 40 MG capsule Take 1 capsule (40 mg total) by mouth daily. 30 capsule 0   ??? predniSONE (DELTASONE) 2.5 MG tablet Take 2 tablets (5 mg total) by mouth daily ALTERNATING with 1 tablet (2.5mg )daily 60 tablet 11   ??? sertraline (ZOLOFT) 100 MG tablet TAKE 1 TABLET BY MOUTH EVERY DAY 90 tablet 3     No current facility-administered medications for this visit.        Changes to medications: [name] reports no changes at this time.    Allergies   Allergen Reactions   ??? Ancef [Cefazolin] Shortness Of Breath   ??? Cephalosporins Shortness Of Breath   ??? Adhesive Tape-Silicones      Other reaction(s): Other (See Comments)  Uncoded Allergy. Allergen: plastic tape, Other Reaction: blistering   ??? Other      Other reaction(s): Other (See Comments)  Uncoded Allergy. Allergen: plastic tape, Other Reaction: blistering   ??? Demerol [Meperidine] Nausea And Vomiting   ??? Pravastatin      Other reaction(s): Other (See Comments)  Muscle Twitching       Changes to allergies: No    SPECIALTY MEDICATION ADHERENCE     Mycophenolate 500mg   : 18 days of medicine on hand   Prednisone 2.5mg   : 15 days of medicine on hand       Medication Adherence    Patient reported X missed doses in the last month: 0  Specialty Medication: mycophenolate 500mg   Patient is on additional specialty medications: Yes  Additional Specialty Medications: Prednisone 2.5mg   Patient Reported Additional Medication X Missed Doses in the Last Month: 0                      Specialty medication(s) dose(s) confirmed: Regimen is correct and unchanged.     Are there any concerns with adherence? No    Adherence counseling provided? Not needed    CLINICAL MANAGEMENT AND INTERVENTION      Clinical Benefit Assessment:    Do you feel the medicine is effective or helping your condition? Yes    Clinical Benefit counseling provided? Not needed    Adverse Effects Assessment:    Are you experiencing any side effects? No    Are you experiencing difficulty administering your medicine? No    Quality of Life Assessment:    Quality of Life    Rheumatology  Oncology  Dermatology  Cystic Fibrosis          How many days over the past month did your transplant  keep you from your normal activities? For example, brushing your teeth or getting up in the morning. 0    Have you discussed this with your provider? Not needed    Acute Infection Status:    Acute infections noted within Epic:  No active infections  Patient reported infection: None    Therapy Appropriateness:    Is therapy appropriate and patient progressing towards therapeutic goals? Yes, therapy is appropriate and should be continued    DISEASE/MEDICATION-SPECIFIC INFORMATION      N/A    Solid Organ Transplant: Not Applicable    PATIENT SPECIFIC NEEDS     - Does the patient have any physical, cognitive, or cultural barriers? No    - Is the patient high risk? Yes, patient is taking a REMS drug. Medication is dispensed in compliance with REMS program    - Did the patient require a clinical intervention? No    - Does the patient require physician intervention or other additional services (i.e., nutrition, smoking cessation, social work)? No    SOCIAL DETERMINANTS OF HEALTH     At the [facility], we have learned that life circumstances - like trouble affording food, housing, utilities, or transportation can affect the health of many of our patients.   That is why we wanted to ask: are you currently experiencing any life circumstances that are negatively impacting your health and/or quality of life? No    Social Determinants of Health     Financial Resource Strain: Low Risk  (05/07/2020)    Overall Financial Resource Strain (CARDIA)    ??? Difficulty of Paying Living Expenses: Not hard at all   Internet Connectivity: Not on file   Food Insecurity: No Food Insecurity (05/07/2020)    Hunger Vital Sign    ??? Worried About Running Out of Food in the Last Year: Never true    ??? Ran Out of Food in the Last Year: Never true   Tobacco Use: Low Risk  (09/06/2022)    Patient History    ??? Smoking Tobacco Use: Never    ??? Smokeless Tobacco Use: Never    ??? Passive Exposure: Never   Housing/Utilities: Low Risk  (05/07/2020)    Housing/Utilities    ??? Within the past 12 months, have you ever stayed: outside, in a car, in a tent, in an overnight shelter, or temporarily in someone else's home (i.e. couch-surfing)?: No    ??? Are you worried about losing your  housing?: No    ??? Within the past 12 months, have you been unable to get utilities (heat, electricity) when it was really needed?: No   Alcohol Use: Not on file   Transportation Needs: No Transportation Needs (05/07/2020)    PRAPARE - Transportation    ??? Lack of Transportation (Medical): No    ??? Lack of Transportation (Non-Medical): No   Substance Use: Not on file   Health Literacy: Not on file   Physical Activity: Not on file   Interpersonal Safety: Not on file   Stress: Not on file   Intimate Partner Violence: Not on file   Depression: Not on file   Social Connections: Not on file       Would you be willing to receive help with any of the needs that you have identified today? Not applicable       SHIPPING     Specialty Medication(s) to be Shipped:   n/a    Other medication(s) to be shipped: No additional medications requested for fill at this time     Changes to insurance: No    Delivery Scheduled: Patient declined refill at this time due to supply on hand, call set up for next week. patient aware to contact us with any needs before then.     Medication will be delivered via n/a to the confirmed n/a address in [facility].    The patient will receive a drug information handout for each medication shipped and additional FDA Medication Guides as required.  Verified that patient has previously received a Conservation officer, historic buildings and a Surveyor, mining.    The patient or caregiver noted above participated in the development of this care plan and knows that they can request review of or adjustments to the care plan at any time.      All of the patient's questions and concerns have been addressed.    Thad Ranger, PharmD   [facility] Specialty Pharmacist

## 2022-11-10 NOTE — Unmapped (Signed)
[facility] Pharm[facility] Refill Coordination Note    Specialty Medication(s) to be Shipped:   Transplant: mycophenolate mofetil 500mg  and Prednisone 2.5mg     Other medication(s) to be shipped:  calcitriol     Kimberly [name], DOB: [birth year]  Phone: [phone] (home)       All above HIPAA information was verified with patient.     Was a Nurse, learning disability used for this call? No    Completed refill call assessment today to schedule patient's medication shipment from the [facility] [phone]).  All relevant notes have been reviewed.     Specialty medication(s) and dose(s) confirmed: Regimen is correct and unchanged.   Changes to medications: Kimberly [name] reports no changes at this time.  Changes to insurance: No  New side effects reported not previously addressed with a pharmacist or physician: None reported  Questions for the pharmacist: No    Confirmed patient received a Conservation officer, historic buildings and a Surveyor, mining with first shipment. The patient will receive a drug information handout for each medication shipped and additional FDA Medication Guides as required.       DISEASE/MEDICATION-SPECIFIC INFORMATION        N/A    SPECIALTY MEDICATION ADHERENCE     Medication Adherence    Patient reported X missed doses in the last month: 0  Specialty Medication: Mycophenolate 500mg   Patient is on additional specialty medications: Yes  Additional Specialty Medications: Prednisone 2.5mg   Patient Reported Additional Medication X Missed Doses in the Last Month: 0  Patient is on more than two specialty medications: No                                Were doses missed due to medication being on hold? No    Mycophenolate 500 mg: 13 days of medicine on hand   Prednsione 2.5 mg: 13 days of medicine on hand     REFERRAL TO PHARMACIST     Referral to the pharmacist: Not needed      [facility]     Shipping address confirmed in Epic.     Delivery Scheduled: Yes, Expected medication delivery date: 11/17/22. Medication will be delivered via UPS to the prescription address in Epic WAM.    Tera Helper, [facility]   [facility] Shared [facility] Specialty Pharmacist

## 2022-11-14 DIAGNOSIS — Z940 Kidney transplant status: Principal | ICD-10-CM

## 2022-11-14 DIAGNOSIS — Z79899 Other long term (current) drug therapy: Principal | ICD-10-CM

## 2022-11-16 MED FILL — CALCITRIOL 0.25 MCG CAPSULE: ORAL | 30 days supply | Qty: 30 | Fill #11

## 2022-11-16 MED FILL — PREDNISONE 2.5 MG TABLET: ORAL | 40 days supply | Qty: 60 | Fill #8

## 2022-11-16 MED FILL — MYCOPHENOLATE MOFETIL 500 MG TABLET: ORAL | 30 days supply | Qty: 120 | Fill #11

## 2022-11-18 ENCOUNTER — Ambulatory Visit: Admit: 2022-11-18 | Discharge: 2022-11-19 | Payer: MEDICARE

## 2022-11-18 LAB — CBC W/ AUTO DIFF
BASOPHILS ABSOLUTE COUNT: 0.1 10*9/L (ref 0.0–0.1)
BASOPHILS RELATIVE PERCENT: 1.1 %
EOSINOPHILS ABSOLUTE COUNT: 0.1 10*9/L (ref 0.0–0.5)
EOSINOPHILS RELATIVE PERCENT: 0.8 %
HEMATOCRIT: 37.2 % (ref 34.0–44.0)
HEMOGLOBIN: 12.5 g/dL (ref 11.3–14.9)
LYMPHOCYTES ABSOLUTE COUNT: 1.4 10*9/L (ref 1.1–3.6)
LYMPHOCYTES RELATIVE PERCENT: 18.3 %
MEAN CORPUSCULAR HEMOGLOBIN CONC: 33.7 g/dL (ref 32.0–36.0)
MEAN CORPUSCULAR HEMOGLOBIN: 27.1 pg (ref 25.9–32.4)
MEAN CORPUSCULAR VOLUME: 80.5 fL (ref 77.6–95.7)
MEAN PLATELET VOLUME: 6.5 fL — ABNORMAL LOW (ref 6.8–10.7)
MONOCYTES ABSOLUTE COUNT: 0.5 10*9/L (ref 0.3–0.8)
MONOCYTES RELATIVE PERCENT: 6.8 %
NEUTROPHILS ABSOLUTE COUNT: 5.4 10*9/L (ref 1.8–7.8)
NEUTROPHILS RELATIVE PERCENT: 73 %
PLATELET COUNT: 289 10*9/L (ref 150–450)
RED BLOOD CELL COUNT: 4.62 10*12/L (ref 3.95–5.13)
RED CELL DISTRIBUTION WIDTH: 16.7 % — ABNORMAL HIGH (ref 12.2–15.2)
WBC ADJUSTED: 7.4 10*9/L (ref 3.6–11.2)

## 2022-11-18 LAB — COMPREHENSIVE METABOLIC PANEL
ALBUMIN: 4.3 g/dL (ref 3.4–5.0)
ALKALINE PHOSPHATASE: 77 U/L (ref 46–116)
ALT (SGPT): 53 U/L — ABNORMAL HIGH (ref 10–49)
ANION GAP: 7 mmol/L (ref 5–14)
AST (SGOT): 32 U/L (ref <=34)
BILIRUBIN TOTAL: 0.4 mg/dL (ref 0.3–1.2)
BLOOD UREA NITROGEN: 15 mg/dL (ref 9–23)
BUN / CREAT RATIO: 13
CALCIUM: 9.9 mg/dL (ref 8.7–10.4)
CHLORIDE: 103 mmol/L (ref 98–107)
CO2: 28.2 mmol/L (ref 20.0–31.0)
CREATININE: 1.2 mg/dL — ABNORMAL HIGH
EGFR CKD-EPI (2021) FEMALE: 58 mL/min/{1.73_m2} — ABNORMAL LOW (ref >=60)
GLUCOSE RANDOM: 117 mg/dL (ref 70–179)
POTASSIUM: 3.8 mmol/L (ref 3.4–4.8)
PROTEIN TOTAL: 7.1 g/dL (ref 5.7–8.2)
SODIUM: 138 mmol/L (ref 135–145)

## 2022-11-18 LAB — PHOSPHORUS: PHOSPHORUS: 2 mg/dL — ABNORMAL LOW (ref 2.4–5.1)

## 2022-11-18 LAB — MAGNESIUM: MAGNESIUM: 1.8 mg/dL (ref 1.6–2.6)

## 2022-11-18 MED ADMIN — belatacept (NULOJIX) 500 mg in sodium chloride (NS) 0.9 % 100 mL IVPB: 5 mg/kg | INTRAVENOUS | @ 20:00:00 | Stop: 2022-11-18

## 2022-11-18 NOTE — Unmapped (Signed)
[facility] for Belatacept infusion, S/P Kidney Transplant.  Patient has received previous doses of Belatacept in the Transplant Clinic and tolerated them well. Patient denies any new concerns or questions, denies any changes to her medical history, medications and allergies reviewed by Clinical research associate. 24G PIV placed right arm, patient tolerated well, labs obtained per standing orders, no premeds ordered. Call light at patient's side, pt instructed on how to use.     1508 Belatacept 500 mg in 100 ml started per provider order     1538 Belatacept complete.      Patient tolerated infusion with no ill effects, PIV flushed with normal saline per infusion protocol, PIV D/C'd, gauze and coban applied. Vitals stable and comparable to baseline. Patient discharged from Infusion Center in stable condition with no acute distress. Next infusion appointment made for four weeks.

## 2022-11-19 LAB — CMV DNA, QUANTITATIVE, PCR: CMV VIRAL LD: NOT DETECTED

## 2022-11-27 DIAGNOSIS — D631 Anemia in chronic kidney disease: Principal | ICD-10-CM

## 2022-11-27 DIAGNOSIS — N185 Chronic kidney disease, stage 5: Principal | ICD-10-CM

## 2022-11-29 DIAGNOSIS — Z940 Kidney transplant status: Principal | ICD-10-CM

## 2022-11-29 DIAGNOSIS — R609 Edema, unspecified: Principal | ICD-10-CM

## 2022-11-29 MED ORDER — CHLORTHALIDONE 25 MG TABLET
ORAL_TABLET | Freq: Every day | ORAL | 3 refills | 90 days
Start: 2022-11-29 — End: 2023-11-29

## 2022-12-02 DIAGNOSIS — R609 Edema, unspecified: Principal | ICD-10-CM

## 2022-12-02 DIAGNOSIS — Z940 Kidney transplant status: Principal | ICD-10-CM

## 2022-12-02 MED ORDER — CHLORTHALIDONE 25 MG TABLET
ORAL_TABLET | Freq: Every day | ORAL | 3 refills | 90 days | Status: CP
Start: 2022-12-02 — End: 2023-12-02

## 2022-12-05 DIAGNOSIS — Z940 Kidney transplant status: Principal | ICD-10-CM

## 2022-12-05 MED ORDER — MYCOPHENOLATE MOFETIL 500 MG TABLET
ORAL_TABLET | Freq: Two times a day (BID) | ORAL | 11 refills | 30 days
Start: 2022-12-05 — End: 2023-12-05

## 2022-12-05 MED ORDER — CALCITRIOL 0.25 MCG CAPSULE
ORAL_CAPSULE | Freq: Every day | ORAL | 11 refills | 30 days
Start: 2022-12-05 — End: 2023-12-05

## 2022-12-06 MED ORDER — CALCITRIOL 0.25 MCG CAPSULE
ORAL_CAPSULE | Freq: Every day | ORAL | 11 refills | 30 days | Status: CP
Start: 2022-12-06 — End: 2023-12-06
  Filled 2022-12-19: qty 30, 30d supply, fill #0

## 2022-12-06 MED ORDER — MYCOPHENOLATE MOFETIL 500 MG TABLET
ORAL_TABLET | Freq: Two times a day (BID) | ORAL | 11 refills | 30 days | Status: CP
Start: 2022-12-06 — End: 2023-12-06
  Filled 2022-12-19: qty 120, 30d supply, fill #0

## 2022-12-06 NOTE — Unmapped (Signed)
[facility] Refill Coordination Note    Kimberly [name], DOB: March 27, 1981  Phone: [phone] (home)       All above HIPAA information was verified with patient.         12/06/2022     2:31 PM   Specialty Rx Medication Refill Questionnaire   Which Medications would you like refilled and shipped? Calctriol 26 days remaining, Mycophenolate 18 days remaining   Please list all current allergies: Demoral, ancef, plastic tape   Have you missed any doses in the last 30 days? No   Have you had any changes to your medication(s) since your last refill? No   How many days remaining of each medication do you have at home? 18 days mycophenolate, and 26 days cacitriol   Have you experienced any side effects in the last 30 days? No   Please enter the full address (street address, city, state, zip code) where you would like your medication(s) to be delivered to. [address], [place], [name] [address]   Please specify on which day you would like your medication(s) to arrive. Note: if you need your medication(s) within 3 days, please call the pharmacy to schedule your order at [phone]  12/20/2022   Has your insurance changed since your last refill? No   Would you like a pharmacist to call you to discuss your medication(s)? No   Do you require a signature for your package? (Note: if we are billing Medicare Part B or your order contains a controlled substance, we will require a signature) No         Completed refill call assessment today to schedule patient's medication shipment from the [facility] [phone]).  All relevant notes have been reviewed.       Confirmed patient received a Conservation officer, historic buildings and a Surveyor, mining with first shipment. The patient will receive a drug information handout for each medication shipped and additional FDA Medication Guides as required.         REFERRAL TO PHARMACIST     Referral to the pharmacist: Not needed      [facility]     Shipping address confirmed in Epic.     Delivery Scheduled: Yes, Expected medication delivery date: 12/20/22.     Medication will be delivered via UPS to the prescription address in Epic WAM.    Quintella Reichert   [facility] Specialty Technician

## 2022-12-16 ENCOUNTER — Ambulatory Visit: Admit: 2022-12-16 | Discharge: 2022-12-16 | Payer: MEDICARE

## 2022-12-16 ENCOUNTER — Ambulatory Visit: Admit: 2022-12-16 | Discharge: 2022-12-16 | Payer: MEDICARE | Attending: Nephrology | Primary: Nephrology

## 2022-12-16 DIAGNOSIS — Z940 Kidney transplant status: Principal | ICD-10-CM

## 2022-12-16 DIAGNOSIS — I890 Lymphedema, not elsewhere classified: Principal | ICD-10-CM

## 2022-12-16 DIAGNOSIS — T82898D Other specified complication of vascular prosthetic devices, implants and grafts, subsequent encounter: Principal | ICD-10-CM

## 2022-12-16 DIAGNOSIS — Z79899 Other long term (current) drug therapy: Principal | ICD-10-CM

## 2022-12-16 DIAGNOSIS — E781 Pure hyperglyceridemia: Principal | ICD-10-CM

## 2022-12-16 LAB — CBC W/ AUTO DIFF
BASOPHILS ABSOLUTE COUNT: 0.1 10*9/L (ref 0.0–0.1)
BASOPHILS RELATIVE PERCENT: 0.7 %
EOSINOPHILS ABSOLUTE COUNT: 0 10*9/L (ref 0.0–0.5)
EOSINOPHILS RELATIVE PERCENT: 0.6 %
HEMATOCRIT: 39.6 % (ref 34.0–44.0)
HEMOGLOBIN: 13.4 g/dL (ref 11.3–14.9)
LYMPHOCYTES ABSOLUTE COUNT: 1.2 10*9/L (ref 1.1–3.6)
LYMPHOCYTES RELATIVE PERCENT: 15.8 %
MEAN CORPUSCULAR HEMOGLOBIN CONC: 33.8 g/dL (ref 32.0–36.0)
MEAN CORPUSCULAR HEMOGLOBIN: 27.2 pg (ref 25.9–32.4)
MEAN CORPUSCULAR VOLUME: 80.7 fL (ref 77.6–95.7)
MEAN PLATELET VOLUME: 6.5 fL — ABNORMAL LOW (ref 6.8–10.7)
MONOCYTES ABSOLUTE COUNT: 0.4 10*9/L (ref 0.3–0.8)
MONOCYTES RELATIVE PERCENT: 5.7 %
NEUTROPHILS ABSOLUTE COUNT: 6 10*9/L (ref 1.8–7.8)
NEUTROPHILS RELATIVE PERCENT: 77.2 %
NUCLEATED RED BLOOD CELLS: 0 /100{WBCs} (ref <=4)
PLATELET COUNT: 300 10*9/L (ref 150–450)
RED BLOOD CELL COUNT: 4.9 10*12/L (ref 3.95–5.13)
RED CELL DISTRIBUTION WIDTH: 14.7 % (ref 12.2–15.2)
WBC ADJUSTED: 7.7 10*9/L (ref 3.6–11.2)

## 2022-12-16 LAB — URINALYSIS WITH MICROSCOPY
BILIRUBIN UA: NEGATIVE
BLOOD UA: NEGATIVE
GLUCOSE UA: NEGATIVE
KETONES UA: NEGATIVE
LEUKOCYTE ESTERASE UA: NEGATIVE
NITRITE UA: NEGATIVE
PH UA: 6 (ref 5.0–9.0)
RBC UA: <1 /HPF (ref <4)
SPECIFIC GRAVITY UA: <=1.005 (ref 1.005–1.030)
SQUAMOUS EPITHELIAL: <1 /HPF (ref 0–5)
UROBILINOGEN UA: 0.2
WBC UA: 1 /HPF (ref 0–5)

## 2022-12-16 LAB — ALBUMIN / CREATININE URINE RATIO
ALBUMIN QUANT URINE: 15 mg/dL
ALBUMIN/CREATININE RATIO: 666.7 ug/mg — ABNORMAL HIGH (ref 0.0–30.0)
CREATININE, URINE: 22.5 mg/dL

## 2022-12-16 LAB — COMPREHENSIVE METABOLIC PANEL
ALBUMIN: 4.3 g/dL (ref 3.4–5.0)
ALKALINE PHOSPHATASE: 77 U/L (ref 46–116)
ALT (SGPT): 51 U/L — ABNORMAL HIGH (ref 10–49)
ANION GAP: 7 mmol/L (ref 5–14)
AST (SGOT): 30 U/L (ref <=34)
BILIRUBIN TOTAL: 0.5 mg/dL (ref 0.3–1.2)
BLOOD UREA NITROGEN: 20 mg/dL (ref 9–23)
BUN / CREAT RATIO: 17
CALCIUM: 10.8 mg/dL — ABNORMAL HIGH (ref 8.7–10.4)
CHLORIDE: 100 mmol/L (ref 98–107)
CO2: 28.7 mmol/L (ref 20.0–31.0)
CREATININE: 1.17 mg/dL — ABNORMAL HIGH
EGFR CKD-EPI (2021) FEMALE: 60 mL/min/{1.73_m2} (ref >=60)
GLUCOSE RANDOM: 113 mg/dL (ref 70–179)
POTASSIUM: 3.6 mmol/L (ref 3.4–4.8)
PROTEIN TOTAL: 7.2 g/dL (ref 5.7–8.2)
SODIUM: 136 mmol/L (ref 135–145)

## 2022-12-16 LAB — PROTEIN / CREATININE RATIO, URINE
CREATININE, URINE: 22.7 mg/dL
PROTEIN URINE: 23.4 mg/dL
PROTEIN/CREAT RATIO, URINE: 1.031

## 2022-12-16 LAB — PHOSPHORUS: PHOSPHORUS: 2.5 mg/dL (ref 2.4–5.1)

## 2022-12-16 LAB — MAGNESIUM: MAGNESIUM: 1.6 mg/dL (ref 1.6–2.6)

## 2022-12-16 MED ORDER — EZETIMIBE 10 MG TABLET
ORAL_TABLET | Freq: Every day | ORAL | 3 refills | 90 days | Status: CP
Start: 2022-12-16 — End: 2023-12-16

## 2022-12-16 MED ORDER — OMEPRAZOLE 40 MG CAPSULE,DELAYED RELEASE
ORAL_CAPSULE | Freq: Every day | ORAL | 0 refills | 30 days | Status: CP
Start: 2022-12-16 — End: ?

## 2022-12-16 MED ORDER — SERTRALINE 100 MG TABLET
ORAL_TABLET | Freq: Every day | ORAL | 3 refills | 90 days | Status: CP
Start: 2022-12-16 — End: 2023-12-16

## 2022-12-16 MED ORDER — ACETAMINOPHEN 500 MG TABLET
ORAL_TABLET | Freq: Four times a day (QID) | ORAL | 0 refills | 13 days | Status: CP | PRN
Start: 2022-12-16 — End: ?

## 2022-12-16 MED ORDER — ASPIRIN 81 MG TABLET,DELAYED RELEASE
ORAL_TABLET | Freq: Every day | ORAL | 0 refills | 30 days | Status: CP
Start: 2022-12-16 — End: 2023-01-15

## 2022-12-16 MED ORDER — PREDNISONE 2.5 MG TABLET
ORAL_TABLET | Freq: Every day | ORAL | 3 refills | 90.00000 days | Status: CP
Start: 2022-12-16 — End: 2023-12-16
  Filled 2023-01-16: qty 90, 90d supply, fill #0

## 2022-12-16 MED ORDER — FERROUS SULFATE 325 MG (65 MG IRON) TABLET
ORAL_TABLET | Freq: Every day | ORAL | 11 refills | 30 days | Status: CP
Start: 2022-12-16 — End: 2023-12-16

## 2022-12-16 MED ORDER — ASCORBIC ACID (VITAMIN C) 500 MG TABLET
ORAL_TABLET | Freq: Every day | ORAL | 11 refills | 30 days | Status: CP
Start: 2022-12-16 — End: 2023-12-16

## 2022-12-16 MED ADMIN — belatacept (NULOJIX) 512.5 mg in sodium chloride (NS) 0.9 % 100 mL IVPB: 5 mg/kg | INTRAVENOUS | @ 20:00:00 | Stop: 2022-12-16

## 2022-12-16 NOTE — Unmapped (Signed)
[facility] Belatacept infusion, S/P Kidney Transplant.  Patient has received previous doses of Belatacept in the Transplant Clinic and tolerated them well. Patient denies any new concerns or questions, denies any changes to her medical history, medications and allergies reviewed by Clinical research associate. 24G PIV placed in right anterior forearm using Accuvein, patient tolerated well, labs obtained per standing orders, no premeds ordered. Call light at patient's side, pt instructed on how to use.     1447 Belatacept 512.5 mg in 100 ml started per provider order     1517 Belatacept complete.      Patient tolerated infusion with no ill effects, PIV flushed with normal saline per infusion protocol, PIV D/C'd, gauze and coban applied. Vitals stable and comparable to baseline. Patient discharged from Infusion Center in stable condition with no acute distress. Next infusion appointment made for four weeks.

## 2022-12-16 NOTE — Unmapped (Signed)
Transplant Coordinator, Clinic Visit   Pt seen today by transplant nephrology for follow up, reviewed medications and symptoms.          12/16/22 1331   BP: 129/76   Pulse: 95   Temp: 36.1 ??C (97 ??F)   Weight: (!) 102.3 kg (225 lb 9.6 oz)   PainSc: 0-No pain       Assessment  BP @ Home: 116/69    BG: N/A  HA/Dizziness/Lightheaded: No  Hand tremors: No  Numbness/tingling: No  Fevers/Chills/sweats: No  Chest Pain/SOB: No  N/V/Heartburn: No  Diarrhea/constipation: No  UTI symptoms: No  Swelling: No  Pain: 0  Incision/Drain/Foley: N/A    Good appetite; reports adequate hydration    Any new medications? No  Immunosuppressant last taken: Belatacept today    Immunization status: Updated    Functional Score: 100   Normal no complaints; no evidence of  disease.

## 2022-12-16 NOTE — Unmapped (Signed)
Referred from Dr. Gwynneth Munson for AVF ligation left arm. Requested PVL duplex and appointmetn with Dr. Norma Fredrickson. Current Creatinine 1.2

## 2022-12-16 NOTE — Unmapped (Signed)
Transplant Nephrology Clinic Visit    History of Present Illness    42 y.o. female here for follow up after kidney transplantation.      Transplant History:    Date of Transplant: 12/20/18  Organ Received: deceased donor kidney transplant, KDPI 39%  Native Kidney Disease: reflux nephropathy  Post-Transplant Course: recurrent UTIs, has seen Urogyn and started on D-mannose with no further UTIs.  Prior Transplants: 1st kidney transplant - 1995 -1997 - LR failed due to thrombotic issues.  2nd kidney transplant - 2000-2002 - DD failed chronic rejection.  3rd kidney transplant - 2004 - 2007 - DD failed in 2007 chronic rejection.  Induction: alemtuzumab  Date of Ureteral Stent Removal: 01/10/2019  CMV and EBV Serologies: CMV R+, EBV R+  Rejection Episodes: none with current kidney transplant  Donor Specific Antibodies: none yet identified  Results of Renal Imaging (pre and post):  chronic lymphedema in left hand due to LUE AVF      Subjective/Interval:   Belatacept infusion planned for today    She wants to take prednisone 2.5 mg daily instead of alternating with 5 mg.    No hospitalizations, no ED visits in interval since last visit  No chest pain, no shortness of breath at rest, no lower extremity edema, no nausea, no vomiting, no diarrhea, no pain swallowing, no tremors, no fevers, no chills, no skin changes.  No pain urinating, no trouble passing urine, no visible blood in urine.  The patient reports they are able to obtain and take their immunosuppressants without issues.    Review of Systems    Otherwise as per HPI, all other systems reviewed and are negative.    Past Medical History, Surgical History, Family History, and Social History reviewed in electronic record        Medications  Current Outpatient Medications   Medication Sig Dispense Refill    acetaminophen (TYLENOL) 500 MG tablet Take 2 tablets (1,000 mg total) by mouth every six (6) hours as needed for pain. 100 tablet 0    ascorbic acid, vitamin C, (VITAMIN C) 500 MG tablet Take 1 tablet (500 mg total) by mouth daily.      aspirin (ECOTRIN) 81 MG tablet Take 1 tablet (81 mg total) by mouth daily. 30 tablet 0    calcitriol (ROCALTROL) 0.25 MCG capsule Take 1 capsule (0.25 mcg total) by mouth daily. 30 capsule 11    carboxymethylcellulose sodium (REFRESH CELLUVISC) 1 % DpGe Instill drops in affected eye(s) as directed as needed.      chlorthalidone (HYGROTON) 25 MG tablet Take 1 tablet (25 mg total) by mouth daily. 90 tablet 3    cranberry 500 mg cap Take 500 mg by mouth in the morning.      cranberry-vitamin C-mannose 250-30-50 mg Chew Chew 1 tablet  in the morning. 90 tablet 1    ezetimibe (ZETIA) 10 mg tablet Take 1 tablet (10 mg total) by mouth daily. 90 tablet 3    ferrous sulfate 325 (65 FE) MG tablet Take 1 tablet (325 mg total) by mouth in the morning.      mycophenolate (CELLCEPT) 500 mg tablet Take 2 tablets (1,000 mg total) by mouth Two (2) times a day. 120 tablet 11    omega-3 acid ethyl esters (LOVAZA) 1 gram capsule Take 1 capsule (1 g total) by mouth Two (2) times a day. E78.1 hypertriglyceridemia 180 capsule 3    omeprazole (PRILOSEC) 40 MG capsule Take 1 capsule (40 mg total) by mouth daily.  30 capsule 0    predniSONE (DELTASONE) 2.5 MG tablet Take 2 tablets (5 mg total) by mouth daily ALTERNATING with 1 tablet (2.5mg )daily 60 tablet 11    sertraline (ZOLOFT) 100 MG tablet TAKE 1 TABLET BY MOUTH EVERY DAY 90 tablet 3     No current facility-administered medications for this visit.           Physical Exam  BP 129/76 (BP Site: R Arm, BP Position: Sitting, BP Cuff Size: Large)  - Pulse 95  - Temp 36.1 ??C (97 ??F) (Temporal)  - Wt (!) 102.3 kg (225 lb 9.6 oz)  - BMI 38.70 kg/m??   General: no acute distress  HEENT: mucous membranes moist  Neck: neck supple, no cervical lymphadenopathy appreciated  CV: normal rate, normal rhythm, no murmur, no gallops, no rubs appreciated  Lungs: clear to auscultation bilaterally  Abdomen: soft, non tender, Extremities:  no lower extremity edema, LUE AVF with left hand edema  Musculoskeletal: no visible deformity, normal range of motion.  Pulses: intact distally throughout  Neurologic: awake, alert, and oriented x3      Laboratory Data and Imaging reviewed in EPIC      Assessment:      ICD-10-CM   1. Lymphedema  I89.0   2. Kidney replaced by transplant  Z94.0   3. Encounter for long-term current use of medication  Z79.899   4. Hypertriglyceridemia  E78.1   5. Problem with dialysis access, subsequent encounter  T82.898D           Plan:     Status Post Kidney Transplant, Allograft function: Cr 1.17, stable within baseline.  No DSAs. UPCR mildly increased to 1g, will repeat with next lab draw and if increased will consider biopsy    Immunosuppression [High Risk Medical Decision Making For Drug Therapy Requiring Intensive Monitoring For Toxicity]: reduce prednisone dose to 2.5 mg daily, continue belatacept infusions and current immunosuppression dosing.    Other interventions:  -Primary hypertension: BP is at goal, continue chlorthalidone  -left upper extremity lymphedema: due to old clotted LUE AVF, will refer to transplant surgery to evaluate for ligation          Counseling:  I counseled the patient on:  The need to avoid sun exposure and the use of sunblock while outdoors given the relatively higher risk of skin malignancy in an immunosuppressed state.  The need for adherence to immunosuppression medication.  Patient verbalized understanding.     Follow-Up:  Return in about 6 months (around 06/16/2023).    Patient will continue to follow-up with her primary care provider for non-transplant related issues and medication refills. We have ordered transplant specific labs per the center's guidelines to monitor and assess for toxicities from immunosuppressant drug therapy          Clelia Croft, DO  Transplant Nephrology  [facility] Division of Nephrology and Hypertension  12/16/2022  1:46 PM

## 2022-12-17 LAB — CMV DNA, QUANTITATIVE, PCR: CMV VIRAL LD: NOT DETECTED

## 2022-12-19 DIAGNOSIS — Z79899 Other long term (current) drug therapy: Principal | ICD-10-CM

## 2022-12-19 DIAGNOSIS — Z940 Kidney transplant status: Principal | ICD-10-CM

## 2022-12-20 NOTE — Unmapped (Signed)
Patient has received a call to confirm an appointment that has been request by the provider.. The patient as agreed to the date/time provided by the scheduler.  01/13/23 [facility] [place] at 1100 am  01/13/23 Dr Norma Fredrickson at 130 pm

## 2022-12-20 NOTE — Unmapped (Signed)
Reviewed labs with Dr. Gwynneth Munson due to UPCR increase:     Latest Reference Range & Units Most Recent 12/16/22 15:32   Protein/Creatinine Ratio, Urine Undefined  1.031  12/16/22 15:32 1.031     Recommended for pt to repeat labs later this week to include UPCR, BK PCR, and DSA's.     Left detailed VM explaining to get repeat labs at a [facility] lab later this week.

## 2022-12-23 ENCOUNTER — Ambulatory Visit: Admit: 2022-12-23 | Discharge: 2022-12-23 | Payer: MEDICARE

## 2022-12-23 ENCOUNTER — Encounter: Admit: 2022-12-23 | Discharge: 2022-12-23 | Payer: MEDICARE | Attending: Nephrology | Primary: Nephrology

## 2022-12-23 LAB — PROTEIN / CREATININE RATIO, URINE
CREATININE, URINE: 70.3 mg/dL
PROTEIN URINE: 23.5 mg/dL
PROTEIN/CREAT RATIO, URINE: 0.334

## 2022-12-23 LAB — CMV DNA, QUANTITATIVE, PCR: CMV VIRAL LD: NOT DETECTED

## 2022-12-23 LAB — CBC W/ AUTO DIFF
BASOPHILS ABSOLUTE COUNT: 0.1 10*9/L (ref 0.0–0.1)
BASOPHILS RELATIVE PERCENT: 1.2 %
EOSINOPHILS ABSOLUTE COUNT: 0.1 10*9/L (ref 0.0–0.5)
EOSINOPHILS RELATIVE PERCENT: 2.1 %
HEMATOCRIT: 40.2 % (ref 34.0–44.0)
HEMOGLOBIN: 13.4 g/dL (ref 11.3–14.9)
LYMPHOCYTES ABSOLUTE COUNT: 1.5 10*9/L (ref 1.1–3.6)
LYMPHOCYTES RELATIVE PERCENT: 24.3 %
MEAN CORPUSCULAR HEMOGLOBIN CONC: 33.4 g/dL (ref 32.0–36.0)
MEAN CORPUSCULAR HEMOGLOBIN: 27.1 pg (ref 25.9–32.4)
MEAN CORPUSCULAR VOLUME: 81.1 fL (ref 77.6–95.7)
MEAN PLATELET VOLUME: 6.4 fL — ABNORMAL LOW (ref 6.8–10.7)
MONOCYTES ABSOLUTE COUNT: 0.6 10*9/L (ref 0.3–0.8)
MONOCYTES RELATIVE PERCENT: 9.5 %
NEUTROPHILS ABSOLUTE COUNT: 3.9 10*9/L (ref 1.8–7.8)
NEUTROPHILS RELATIVE PERCENT: 62.9 %
NUCLEATED RED BLOOD CELLS: 0 /100{WBCs} (ref <=4)
PLATELET COUNT: 299 10*9/L (ref 150–450)
RED BLOOD CELL COUNT: 4.95 10*12/L (ref 3.95–5.13)
RED CELL DISTRIBUTION WIDTH: 14.4 % (ref 12.2–15.2)
WBC ADJUSTED: 6.3 10*9/L (ref 3.6–11.2)

## 2022-12-23 LAB — COMPREHENSIVE METABOLIC PANEL
ALBUMIN: 4.2 g/dL (ref 3.4–5.0)
ALKALINE PHOSPHATASE: 72 U/L (ref 46–116)
ALT (SGPT): 62 U/L — ABNORMAL HIGH (ref 10–49)
ANION GAP: 6 mmol/L (ref 5–14)
AST (SGOT): 35 U/L — ABNORMAL HIGH (ref <=34)
BILIRUBIN TOTAL: 0.5 mg/dL (ref 0.3–1.2)
BLOOD UREA NITROGEN: 20 mg/dL (ref 9–23)
BUN / CREAT RATIO: 15
CALCIUM: 10.5 mg/dL — ABNORMAL HIGH (ref 8.7–10.4)
CHLORIDE: 101 mmol/L (ref 98–107)
CO2: 30.7 mmol/L (ref 20.0–31.0)
CREATININE: 1.33 mg/dL — ABNORMAL HIGH
EGFR CKD-EPI (2021) FEMALE: 51 mL/min/{1.73_m2} — ABNORMAL LOW (ref >=60)
GLUCOSE RANDOM: 113 mg/dL (ref 70–179)
POTASSIUM: 3.6 mmol/L (ref 3.4–4.8)
PROTEIN TOTAL: 7.1 g/dL (ref 5.7–8.2)
SODIUM: 138 mmol/L (ref 135–145)

## 2022-12-23 LAB — URINALYSIS WITH MICROSCOPY
BILIRUBIN UA: NEGATIVE
GLUCOSE UA: NEGATIVE
KETONES UA: NEGATIVE
LEUKOCYTE ESTERASE UA: NEGATIVE
NITRITE UA: NEGATIVE
PH UA: 6.5 (ref 5.0–9.0)
RBC UA: 24 /HPF — ABNORMAL HIGH (ref <=4)
SPECIFIC GRAVITY UA: 1.011 (ref 1.003–1.030)
SQUAMOUS EPITHELIAL: <1 /HPF (ref 0–5)
UROBILINOGEN UA: <2
WBC UA: 13 /HPF — ABNORMAL HIGH (ref 0–5)

## 2022-12-23 LAB — ALBUMIN / CREATININE URINE RATIO
ALBUMIN QUANT URINE: 9.6 mg/dL
ALBUMIN/CREATININE RATIO: 134.8 ug/mg — ABNORMAL HIGH (ref 0.0–30.0)
CREATININE, URINE: 71.2 mg/dL

## 2022-12-23 LAB — MAGNESIUM: MAGNESIUM: 1.9 mg/dL (ref 1.6–2.6)

## 2022-12-23 LAB — PHOSPHORUS: PHOSPHORUS: 2.5 mg/dL (ref 2.4–5.1)

## 2022-12-23 LAB — BK VIRUS QUANTITATIVE PCR, BLOOD: BK BLOOD RESULT: NOT DETECTED

## 2022-12-28 LAB — DECEASED DONOR CL I&II, LOW RES
DONOR LOW RES DRW #1: 52
DONOR LOW RES DRW #2: 51
DONOR LOW RES HLA A #1: 2
DONOR LOW RES HLA A #2: 33
DONOR LOW RES HLA B #1: 7
DONOR LOW RES HLA B #2: 65
DONOR LOW RES HLA BW #1: 6
DONOR LOW RES HLA BW #2: 6
DONOR LOW RES HLA C #1: 7
DONOR LOW RES HLA C #2: 8
DONOR LOW RES HLA DQ #1: 2
DONOR LOW RES HLA DQ #2: 6
DONOR LOW RES HLA DR #1: 17
DONOR LOW RES HLA DR #2: 15

## 2022-12-29 LAB — HLA DS POST TRANSPLANT
ANTI-DONOR DRW #1 MFI: 47 MFI
ANTI-DONOR DRW #2 MFI: 8 MFI
ANTI-DONOR HLA-A #1 MFI: 65 MFI
ANTI-DONOR HLA-A #2 MFI: 20 MFI
ANTI-DONOR HLA-B #1 MFI: 342 MFI
ANTI-DONOR HLA-B #2 MFI: 40 MFI
ANTI-DONOR HLA-C #1 MFI: 50 MFI
ANTI-DONOR HLA-DP #2 MFI: 18 MFI
ANTI-DONOR HLA-DQB #1 MFI: 29 MFI
ANTI-DONOR HLA-DQB #2 MFI: 60 MFI
ANTI-DONOR HLA-DR #1 MFI: 29 MFI
ANTI-DONOR HLA-DR #2 MFI: 51 MFI

## 2022-12-29 LAB — FSAB CLASS 2 ANTIBODY SPECIFICITY: HLA CL2 AB RESULT: NEGATIVE

## 2022-12-29 LAB — FSAB CLASS 1 ANTIBODY SPECIFICITY: HLA CLASS 1 ANTIBODY RESULT: POSITIVE

## 2023-01-10 NOTE — Unmapped (Signed)
[facility] Refill Coordination Note    Kimberly [name], DOB: 1981-04-02  Phone: [phone] (home)       All above HIPAA information was verified with patient.         01/10/2023    11:24 AM   Specialty Rx Medication Refill Questionnaire   Which Medications would you like refilled and shipped? Mycophenolate 11 days, Prednisone 19 days, Calcitriol 19 days   Please list all current allergies: Demoral, cephalosporin   Have you missed any doses in the last 30 days? No   Have you had any changes to your medication(s) since your last refill? No   How many days remaining of each medication do you have at home? 11 days mycopheolate, Prednisone 19 days, Calcitriol 19 days   Have you experienced any side effects in the last 30 days? No   Please enter the full address (street address, city, state, zip code) where you would like your medication(s) to be delivered to. [address] [place], [name]. [address]   Please specify on which day you would like your medication(s) to arrive. Note: if you need your medication(s) within 3 days, please call the pharmacy to schedule your order at [phone]  01/17/2023   Has your insurance changed since your last refill? No   Would you like a pharmacist to call you to discuss your medication(s)? No   Do you require a signature for your package? (Note: if we are billing Medicare Part B or your order contains a controlled substance, we will require a signature) No         Completed refill call assessment today to schedule patient's medication shipment from the [facility] [phone]).  All relevant notes have been reviewed.       Confirmed patient received a Conservation officer, historic buildings and a Surveyor, mining with first shipment. The patient will receive a drug information handout for each medication shipped and additional FDA Medication Guides as required.         REFERRAL TO PHARMACIST     Referral to the pharmacist: Not needed      [facility]     Shipping address confirmed in Epic.     Delivery Scheduled: Yes, Expected medication delivery date: 01/17/23.     Medication will be delivered via UPS to the prescription address in Epic WAM.    Kimberly [name]   [facility] Specialty Technician

## 2023-01-13 ENCOUNTER — Ambulatory Visit: Admit: 2023-01-13 | Discharge: 2023-01-13 | Payer: MEDICARE

## 2023-01-13 LAB — COMPREHENSIVE METABOLIC PANEL
ALBUMIN: 4.2 g/dL (ref 3.4–5.0)
ALKALINE PHOSPHATASE: 87 U/L (ref 46–116)
ALT (SGPT): 67 U/L — ABNORMAL HIGH (ref 10–49)
ANION GAP: 9 mmol/L (ref 5–14)
BILIRUBIN TOTAL: 0.4 mg/dL (ref 0.3–1.2)
BLOOD UREA NITROGEN: 20 mg/dL (ref 9–23)
BUN / CREAT RATIO: 16
CALCIUM: 10.3 mg/dL (ref 8.7–10.4)
CHLORIDE: 102 mmol/L (ref 98–107)
CO2: 26.1 mmol/L (ref 20.0–31.0)
CREATININE: 1.27 mg/dL — ABNORMAL HIGH
EGFR CKD-EPI (2021) FEMALE: 54 mL/min/{1.73_m2} — ABNORMAL LOW (ref >=60)
GLUCOSE RANDOM: 141 mg/dL (ref 70–179)
PROTEIN TOTAL: 7.1 g/dL (ref 5.7–8.2)
SODIUM: 137 mmol/L (ref 135–145)

## 2023-01-13 LAB — CBC W/ AUTO DIFF
BASOPHILS ABSOLUTE COUNT: 0.1 10*9/L (ref 0.0–0.1)
BASOPHILS RELATIVE PERCENT: 1.3 %
EOSINOPHILS ABSOLUTE COUNT: 0.1 10*9/L (ref 0.0–0.5)
EOSINOPHILS RELATIVE PERCENT: 1.1 %
HEMATOCRIT: 39.8 % (ref 34.0–44.0)
HEMOGLOBIN: 13.5 g/dL (ref 11.3–14.9)
LYMPHOCYTES ABSOLUTE COUNT: 1.7 10*9/L (ref 1.1–3.6)
LYMPHOCYTES RELATIVE PERCENT: 21.8 %
MEAN CORPUSCULAR HEMOGLOBIN CONC: 34 g/dL (ref 32.0–36.0)
MEAN CORPUSCULAR HEMOGLOBIN: 27.2 pg (ref 25.9–32.4)
MEAN CORPUSCULAR VOLUME: 79.8 fL (ref 77.6–95.7)
MEAN PLATELET VOLUME: 7.3 fL (ref 6.8–10.7)
MONOCYTES ABSOLUTE COUNT: 0.5 10*9/L (ref 0.3–0.8)
MONOCYTES RELATIVE PERCENT: 6.1 %
NEUTROPHILS ABSOLUTE COUNT: 5.5 10*9/L (ref 1.8–7.8)
NEUTROPHILS RELATIVE PERCENT: 69.7 %
PLATELET COUNT: 299 10*9/L (ref 150–450)
RED BLOOD CELL COUNT: 4.98 10*12/L (ref 3.95–5.13)
RED CELL DISTRIBUTION WIDTH: 14.2 % (ref 12.2–15.2)
WBC ADJUSTED: 7.9 10*9/L (ref 3.6–11.2)

## 2023-01-13 LAB — MAGNESIUM: MAGNESIUM: 1.7 mg/dL (ref 1.6–2.6)

## 2023-01-13 LAB — PHOSPHORUS: PHOSPHORUS: 2.9 mg/dL (ref 2.4–5.1)

## 2023-01-13 MED ADMIN — belatacept (NULOJIX) 512.5 mg in sodium chloride (NS) 0.9 % 100 mL IVPB: 5 mg/kg | INTRAVENOUS | @ 19:00:00 | Stop: 2023-01-13

## 2023-01-13 NOTE — Unmapped (Signed)
Pt to [facility] for infusion of Belatacept. Pt denies new complaints/ infections. VSS  labs drawn .   1511 Belatacept 512 mg  1542 Belatacept complete   VSS- PIV removed per protocol. Discharged to home without issue.

## 2023-01-14 LAB — CMV DNA, QUANTITATIVE, PCR: CMV VIRAL LD: NOT DETECTED

## 2023-01-16 DIAGNOSIS — Z79899 Other long term (current) drug therapy: Principal | ICD-10-CM

## 2023-01-16 DIAGNOSIS — Z940 Kidney transplant status: Principal | ICD-10-CM

## 2023-01-16 MED ORDER — OMEPRAZOLE 40 MG CAPSULE,DELAYED RELEASE
ORAL_CAPSULE | Freq: Every day | ORAL | 1 refills | 90 days | Status: CP
Start: 2023-01-16 — End: ?

## 2023-01-16 MED FILL — CALCITRIOL 0.25 MCG CAPSULE: ORAL | 30 days supply | Qty: 30 | Fill #1

## 2023-01-16 MED FILL — MYCOPHENOLATE MOFETIL 500 MG TABLET: ORAL | 30 days supply | Qty: 120 | Fill #1

## 2023-01-23 NOTE — Unmapped (Signed)
Patient declined referral to Dr. Norma Fredrickson for assessment for AVF ligation. She canceled her appointment with him today.

## 2023-02-10 ENCOUNTER — Ambulatory Visit: Admit: 2023-02-10 | Discharge: 2023-02-11 | Payer: MEDICARE

## 2023-02-10 LAB — COMPREHENSIVE METABOLIC PANEL
ALBUMIN: 4.3 g/dL (ref 3.4–5.0)
ALKALINE PHOSPHATASE: 81 U/L (ref 46–116)
ALT (SGPT): 64 U/L — ABNORMAL HIGH (ref 10–49)
ANION GAP: 10 mmol/L (ref 5–14)
AST (SGOT): 35 U/L — ABNORMAL HIGH (ref <=34)
BILIRUBIN TOTAL: 0.5 mg/dL (ref 0.3–1.2)
BLOOD UREA NITROGEN: 16 mg/dL (ref 9–23)
BUN / CREAT RATIO: 13
CALCIUM: 10.6 mg/dL — ABNORMAL HIGH (ref 8.7–10.4)
CHLORIDE: 100 mmol/L (ref 98–107)
CO2: 25.4 mmol/L (ref 20.0–31.0)
CREATININE: 1.23 mg/dL — ABNORMAL HIGH
EGFR CKD-EPI (2021) FEMALE: 56 mL/min/{1.73_m2} — ABNORMAL LOW (ref >=60)
GLUCOSE RANDOM: 95 mg/dL (ref 70–179)
POTASSIUM: 3.5 mmol/L (ref 3.4–4.8)
PROTEIN TOTAL: 7.2 g/dL (ref 5.7–8.2)
SODIUM: 135 mmol/L (ref 135–145)

## 2023-02-10 LAB — URINALYSIS WITH MICROSCOPY
BACTERIA: NONE SEEN /HPF
BILIRUBIN UA: NEGATIVE
GLUCOSE UA: NEGATIVE
KETONES UA: NEGATIVE
LEUKOCYTE ESTERASE UA: NEGATIVE
NITRITE UA: NEGATIVE
PH UA: 6.5 (ref 5.0–9.0)
PROTEIN UA: NEGATIVE
RBC UA: <1 /HPF (ref <4)
SPECIFIC GRAVITY UA: <=1.005 (ref 1.005–1.030)
SQUAMOUS EPITHELIAL: <1 /HPF (ref 0–5)
UROBILINOGEN UA: 0.2
WBC UA: 1 /HPF (ref 0–5)

## 2023-02-10 LAB — HLA FLOW CROSSMATCH RECIPIENT
B CHANNEL SHIFT1: 3
B CHANNEL SHIFT2: 5
FLOW B CELL #1: NEGATIVE
FLOW B CELL #2: NEGATIVE
FLOW T CELL #1: NEGATIVE
FLOW T CELL #2: NEGATIVE
T CHANNEL SHIFT1: 6
T CHANNEL SHIFT2: 25

## 2023-02-10 LAB — CBC W/ AUTO DIFF
BASOPHILS ABSOLUTE COUNT: 0.1 10*9/L (ref 0.0–0.1)
BASOPHILS RELATIVE PERCENT: 1.1 %
EOSINOPHILS ABSOLUTE COUNT: 0.1 10*9/L (ref 0.0–0.5)
EOSINOPHILS RELATIVE PERCENT: 1.2 %
HEMATOCRIT: 39.4 % (ref 34.0–44.0)
HEMOGLOBIN: 13.2 g/dL (ref 11.3–14.9)
LYMPHOCYTES ABSOLUTE COUNT: 1.5 10*9/L (ref 1.1–3.6)
LYMPHOCYTES RELATIVE PERCENT: 19.7 %
MEAN CORPUSCULAR HEMOGLOBIN CONC: 33.6 g/dL (ref 32.0–36.0)
MEAN CORPUSCULAR HEMOGLOBIN: 27.1 pg (ref 25.9–32.4)
MEAN CORPUSCULAR VOLUME: 80.5 fL (ref 77.6–95.7)
MEAN PLATELET VOLUME: 6.6 fL — ABNORMAL LOW (ref 6.8–10.7)
MONOCYTES ABSOLUTE COUNT: 0.6 10*9/L (ref 0.3–0.8)
MONOCYTES RELATIVE PERCENT: 8.3 %
NEUTROPHILS ABSOLUTE COUNT: 5.2 10*9/L (ref 1.8–7.8)
NEUTROPHILS RELATIVE PERCENT: 69.7 %
PLATELET COUNT: 256 10*9/L (ref 150–450)
RED BLOOD CELL COUNT: 4.9 10*12/L (ref 3.95–5.13)
RED CELL DISTRIBUTION WIDTH: 14.4 % (ref 12.2–15.2)
WBC ADJUSTED: 7.5 10*9/L (ref 3.6–11.2)

## 2023-02-10 LAB — MAGNESIUM: MAGNESIUM: 1.6 mg/dL (ref 1.6–2.6)

## 2023-02-10 LAB — PHOSPHORUS: PHOSPHORUS: 3.7 mg/dL (ref 2.4–5.1)

## 2023-02-10 MED ADMIN — belatacept (NULOJIX) 512.5 mg in sodium chloride (NS) 0.9 % 100 mL IVPB: 5 mg/kg | INTRAVENOUS | @ 19:00:00 | Stop: 2023-02-10

## 2023-02-10 NOTE — Unmapped (Signed)
Pt to [facility] for infusion of Belatacept. Pt denies new complaints/ infections. VSS. IV placed in right hand,  labs drawn .   1509 Belatacept 512.5 mg  1537 Belatacept complete   VSS- PIV removed per protocol. Discharged to home without issue.

## 2023-02-11 LAB — CMV DNA, QUANTITATIVE, PCR: CMV VIRAL LD: NOT DETECTED

## 2023-02-13 NOTE — Unmapped (Signed)
[facility] Refill Coordination Note    Specialty Medication(s) to be Shipped:   Transplant: mycophenolate mofetil 500mg     Other medication(s) to be shipped:  calcitriol 0.25 mcg     Kimberly [name], DOB: 06-11-1981  Phone: [phone] (home)       All above HIPAA information was verified with patient.     Was a Nurse, learning disability used for this call? No    Completed refill call assessment today to schedule patient's medication shipment from the [facility] [phone]).  All relevant notes have been reviewed.     Specialty medication(s) and dose(s) confirmed: Regimen is correct and unchanged.   Changes to medications: Kimberly [name] reports no changes at this time.  Changes to insurance: No  New side effects reported not previously addressed with a pharmacist or physician: None reported  Questions for the pharmacist: No    Confirmed patient received a Conservation officer, historic buildings and a Surveyor, mining with first shipment. The patient will receive a drug information handout for each medication shipped and additional FDA Medication Guides as required.       DISEASE/MEDICATION-SPECIFIC INFORMATION        N/A    SPECIALTY MEDICATION ADHERENCE     Medication Adherence    Patient reported X missed doses in the last month: 0  Specialty Medication: mycophenolate 500 mg tablet (CELLCEPT)  Patient is on additional specialty medications: No  Patient is on more than two specialty medications: No              Were doses missed due to medication being on hold? No    mycophenolate 500  mg: 9  days of medicine on hand       REFERRAL TO PHARMACIST     Referral to the pharmacist: Not needed      [facility]     Shipping address confirmed in Epic.     Delivery Scheduled: Yes, Expected medication delivery date: 02/17/23.     Medication will be delivered via UPS to the prescription address in Epic WAM.    Kimberly [name]   [facility] Specialty Technician

## 2023-02-16 MED FILL — CALCITRIOL 0.25 MCG CAPSULE: ORAL | 30 days supply | Qty: 30 | Fill #2

## 2023-02-16 MED FILL — MYCOPHENOLATE MOFETIL 500 MG TABLET: ORAL | 30 days supply | Qty: 120 | Fill #2

## 2023-03-10 ENCOUNTER — Ambulatory Visit: Admit: 2023-03-10 | Discharge: 2023-03-11 | Payer: MEDICARE

## 2023-03-10 LAB — CBC W/ AUTO DIFF
BASOPHILS ABSOLUTE COUNT: 0.1 10*9/L (ref 0.0–0.1)
BASOPHILS RELATIVE PERCENT: 0.6 %
EOSINOPHILS ABSOLUTE COUNT: 0.1 10*9/L (ref 0.0–0.5)
EOSINOPHILS RELATIVE PERCENT: 1.1 %
HEMATOCRIT: 38.3 % (ref 34.0–44.0)
HEMOGLOBIN: 12.8 g/dL (ref 11.3–14.9)
LYMPHOCYTES ABSOLUTE COUNT: 1.5 10*9/L (ref 1.1–3.6)
LYMPHOCYTES RELATIVE PERCENT: 18.1 %
MEAN CORPUSCULAR HEMOGLOBIN CONC: 33.5 g/dL (ref 32.0–36.0)
MEAN CORPUSCULAR HEMOGLOBIN: 27.1 pg (ref 25.9–32.4)
MEAN CORPUSCULAR VOLUME: 80.8 fL (ref 77.6–95.7)
MEAN PLATELET VOLUME: 6.4 fL — ABNORMAL LOW (ref 6.8–10.7)
MONOCYTES ABSOLUTE COUNT: 0.6 10*9/L (ref 0.3–0.8)
MONOCYTES RELATIVE PERCENT: 7.5 %
NEUTROPHILS ABSOLUTE COUNT: 5.9 10*9/L (ref 1.8–7.8)
NEUTROPHILS RELATIVE PERCENT: 72.7 %
PLATELET COUNT: 266 10*9/L (ref 150–450)
RED BLOOD CELL COUNT: 4.74 10*12/L (ref 3.95–5.13)
RED CELL DISTRIBUTION WIDTH: 14.7 % (ref 12.2–15.2)
WBC ADJUSTED: 8.1 10*9/L (ref 3.6–11.2)

## 2023-03-10 LAB — COMPREHENSIVE METABOLIC PANEL
ALBUMIN: 4 g/dL (ref 3.4–5.0)
ALKALINE PHOSPHATASE: 86 U/L (ref 46–116)
ALT (SGPT): 54 U/L — ABNORMAL HIGH (ref 10–49)
ANION GAP: 10 mmol/L (ref 5–14)
AST (SGOT): 27 U/L (ref <=34)
BILIRUBIN TOTAL: 0.5 mg/dL (ref 0.3–1.2)
BLOOD UREA NITROGEN: 15 mg/dL (ref 9–23)
BUN / CREAT RATIO: 14
CALCIUM: 10.5 mg/dL — ABNORMAL HIGH (ref 8.7–10.4)
CHLORIDE: 101 mmol/L (ref 98–107)
CO2: 25.1 mmol/L (ref 20.0–31.0)
CREATININE: 1.11 mg/dL — ABNORMAL HIGH
EGFR CKD-EPI (2021) FEMALE: 64 mL/min/{1.73_m2} (ref >=60)
GLUCOSE RANDOM: 106 mg/dL (ref 70–179)
POTASSIUM: 3.3 mmol/L — ABNORMAL LOW (ref 3.4–4.8)
PROTEIN TOTAL: 6.8 g/dL (ref 5.7–8.2)
SODIUM: 136 mmol/L (ref 135–145)

## 2023-03-10 LAB — MAGNESIUM: MAGNESIUM: 1.4 mg/dL — ABNORMAL LOW (ref 1.6–2.6)

## 2023-03-10 LAB — PHOSPHORUS: PHOSPHORUS: 2.6 mg/dL (ref 2.4–5.1)

## 2023-03-10 MED ADMIN — belatacept (NULOJIX) 512.5 mg in sodium chloride (NS) 0.9 % 100 mL IVPB: 5 mg/kg | INTRAVENOUS | @ 20:00:00 | Stop: 2023-03-10

## 2023-03-10 NOTE — Unmapped (Signed)
Pt to [facility] for infusion of Belatacept. Pt denies new complaints/ infections. VSS  labs drawn.        1539 Belatacept 512.5 mg  1614 Belatacept complete     VSS- PIV removed per protocol. Discharged to home without issue.

## 2023-03-11 LAB — CMV DNA, QUANTITATIVE, PCR: CMV VIRAL LD: NOT DETECTED

## 2023-03-15 NOTE — Unmapped (Signed)
[facility] Clinical Assessment & Refill Coordination Note    Kimberly [name], DOB: 29-Aug-1981  Phone: [phone] (home)     All above HIPAA information was verified with patient.     Was a Nurse, learning disability used for this call? No    Specialty Medication(s):   Transplant: mycophenolate mofetil 500mg  and Prednisone 2.5mg      Current Outpatient Medications   Medication Sig Dispense Refill    acetaminophen (TYLENOL) 500 MG tablet Take 2 tablets (1,000 mg total) by mouth every six (6) hours as needed for pain. 100 tablet 0    ascorbic acid, vitamin C, (VITAMIN C) 500 MG tablet Take 1 tablet (500 mg total) by mouth daily. 30 tablet 11    aspirin (ECOTRIN) 81 MG tablet Take 1 tablet (81 mg total) by mouth daily. 30 tablet 0    calcitriol (ROCALTROL) 0.25 MCG capsule Take 1 capsule (0.25 mcg total) by mouth daily. 30 capsule 11    carboxymethylcellulose sodium (REFRESH CELLUVISC) 1 % DpGe Instill drops in affected eye(s) as directed as needed.      chlorthalidone (HYGROTON) 25 MG tablet Take 1 tablet (25 mg total) by mouth daily. 90 tablet 3    cranberry-vitamin C-mannose 250-30-50 mg Chew Chew 1 tablet  in the morning.      ezetimibe (ZETIA) 10 mg tablet Take 1 tablet (10 mg total) by mouth daily. 90 tablet 3    ferrous sulfate 325 (65 FE) MG tablet Take 1 tablet (325 mg total) by mouth in the morning. 30 tablet 11    mycophenolate (CELLCEPT) 500 mg tablet Take 2 tablets (1,000 mg total) by mouth Two (2) times a day. 120 tablet 11    omega-3 acid ethyl esters (LOVAZA) 1 gram capsule Take 1 capsule (1 g total) by mouth two (2) times a day. E78.1 hypertriglyceridemia      omeprazole (PRILOSEC) 40 MG capsule TAKE 1 CAPSULE (40 MG TOTAL) BY MOUTH DAILY. 90 capsule 1    predniSONE (DELTASONE) 2.5 MG tablet Take 1 tablet (2.5 mg total) by mouth daily. 90 tablet 3    sertraline (ZOLOFT) 100 MG tablet Take 1 tablet (100 mg total) by mouth daily. 90 tablet 3     No current facility-administered medications for this visit.        Changes to medications: Shantai reports no changes at this time.    Allergies   Allergen Reactions    Ancef [Cefazolin] Shortness Of Breath    Cephalosporins Shortness Of Breath    Adhesive Tape-Silicones      Other reaction(s): Other (See Comments)  Uncoded Allergy. Allergen: plastic tape, Other Reaction: blistering    Other      Other reaction(s): Other (See Comments)  Uncoded Allergy. Allergen: plastic tape, Other Reaction: blistering    Demerol [Meperidine] Nausea And Vomiting    Pravastatin      Other reaction(s): Other (See Comments)  Muscle Twitching       Changes to allergies: No    SPECIALTY MEDICATION ADHERENCE     Mycophenolate 500mg   : 10 days of medicine on hand   Prednisone 2.5mg   : 45 days of medicine on hand       Medication Adherence    Patient reported X missed doses in the last month: 0  Specialty Medication: mycophenolate 500mg   Patient is on additional specialty medications: Yes  Additional Specialty Medications: Prednisone 2.5mg   Patient Reported Additional Medication X Missed Doses in the Last Month: 0  Specialty medication(s) dose(s) confirmed: Regimen is correct and unchanged.     Are there any concerns with adherence? No    Adherence counseling provided? Not needed    CLINICAL MANAGEMENT AND INTERVENTION      Clinical Benefit Assessment:    Do you feel the medicine is effective or helping your condition? Yes    Clinical Benefit counseling provided? Not needed    Adverse Effects Assessment:    Are you experiencing any side effects? No    Are you experiencing difficulty administering your medicine? No    Quality of Life Assessment:    Quality of Life    Rheumatology  Oncology  Dermatology  Cystic Fibrosis          How many days over the past month did your transplant  keep you from your normal activities? For example, brushing your teeth or getting up in the morning. 0    Have you discussed this with your provider? Not needed    Acute Infection Status:    Acute infections noted within Epic:  No active infections  Patient reported infection: None    Therapy Appropriateness:    Is therapy appropriate and patient progressing towards therapeutic goals? Yes, therapy is appropriate and should be continued    DISEASE/MEDICATION-SPECIFIC INFORMATION      N/A    Solid Organ Transplant: Not Applicable    PATIENT SPECIFIC NEEDS     Does the patient have any physical, cognitive, or cultural barriers? No    Is the patient high risk? Yes, patient is taking a REMS drug. Medication is dispensed in compliance with REMS program    Did the patient require a clinical intervention? No    Does the patient require physician intervention or other additional services (i.e., nutrition, smoking cessation, social work)? No    SOCIAL DETERMINANTS OF HEALTH     At the [facility], we have learned that life circumstances - like trouble affording food, housing, utilities, or transportation can affect the health of many of our patients.   That is why we wanted to ask: are you currently experiencing any life circumstances that are negatively impacting your health and/or quality of life? No    Social Determinants of Health     Financial Resource Strain: Low Risk  (12/06/2022)    Received from [facility]    Overall Financial Resource Strain (CARDIA)     Difficulty of Paying Living Expenses: Not hard at all   Internet Connectivity: Not on file   Food Insecurity: No Food Insecurity (12/06/2022)    Received from [facility]    Hunger Vital Sign     Worried About Running Out of Food in the Last Year: Never true     Ran Out of Food in the Last Year: Never true   Tobacco Use: Low Risk  (12/29/2022)    Patient History     Smoking Tobacco Use: Never     Smokeless Tobacco Use: Never     Passive Exposure: Never   Housing/Utilities: Low Risk  (05/07/2020)    Housing/Utilities     Within the past 12 months, have you ever stayed: outside, in a car, in a tent, in an overnight shelter, or temporarily in someone else's home (i.e. couch-surfing)?: No     Are you worried about losing your housing?: No     Within the past 12 months, have you been unable to get utilities (heat, electricity) when it was really needed?:  No   Alcohol Use: Not on file   Transportation Needs: Unknown (12/06/2022)    Received from [facility]    PRAPARE - Transportation     Lack of Transportation (Medical): Patient declined     Lack of Transportation (Non-Medical): No   Substance Use: Not on file   Health Literacy: Not on file   Physical Activity: Not on file   Interpersonal Safety: Not on file   Stress: Not on file   Intimate Partner Violence: Not on file   Depression: Not at risk (02/01/2021)    Received from [facility]    PHQ-2     (OBSOLETE) Total Prescreening Score: 0   Social Connections: Not on file       Would you be willing to receive help with any of the needs that you have identified today? Not applicable       SHIPPING     Specialty Medication(s) to be Shipped:   Transplant: mycophenolate mofetil 500mg     Other medication(s) to be shipped:  calcitriol     Changes to insurance: No    Delivery Scheduled: Yes, Expected medication delivery date: 03/22/2023.     Medication will be delivered via UPS to the confirmed prescription address in [facility].    The patient will receive a drug information handout for each medication shipped and additional FDA Medication Guides as required.  Verified that patient has previously received a Conservation officer, historic buildings and a Surveyor, mining.    The patient or caregiver noted above participated in the development of this care plan and knows that they can request review of or adjustments to the care plan at any time.      All of the patient's questions and concerns have been addressed.    Thad Ranger, PharmD   [facility] Specialty Pharmacist

## 2023-03-20 MED FILL — MYCOPHENOLATE MOFETIL 500 MG TABLET: ORAL | 30 days supply | Qty: 120 | Fill #3

## 2023-03-20 MED FILL — CALCITRIOL 0.25 MCG CAPSULE: ORAL | 30 days supply | Qty: 30 | Fill #3

## 2023-03-26 DIAGNOSIS — N185 Chronic kidney disease, stage 5: Principal | ICD-10-CM

## 2023-03-26 DIAGNOSIS — D631 Anemia in chronic kidney disease: Principal | ICD-10-CM

## 2023-04-02 DIAGNOSIS — Z940 Kidney transplant status: Principal | ICD-10-CM

## 2023-04-02 DIAGNOSIS — Z79899 Other long term (current) drug therapy: Principal | ICD-10-CM

## 2023-04-07 ENCOUNTER — Ambulatory Visit: Admit: 2023-04-07 | Discharge: 2023-04-08 | Payer: MEDICARE

## 2023-04-07 LAB — CBC W/ AUTO DIFF
BASOPHILS ABSOLUTE COUNT: 0.1 10*9/L (ref 0.0–0.1)
BASOPHILS RELATIVE PERCENT: 1 %
EOSINOPHILS ABSOLUTE COUNT: 0.1 10*9/L (ref 0.0–0.5)
EOSINOPHILS RELATIVE PERCENT: 0.7 %
HEMATOCRIT: 37.5 % (ref 34.0–44.0)
HEMOGLOBIN: 12.8 g/dL (ref 11.3–14.9)
LYMPHOCYTES ABSOLUTE COUNT: 1.6 10*9/L (ref 1.1–3.6)
LYMPHOCYTES RELATIVE PERCENT: 18.1 %
MEAN CORPUSCULAR HEMOGLOBIN CONC: 34.3 g/dL (ref 32.0–36.0)
MEAN CORPUSCULAR HEMOGLOBIN: 27.5 pg (ref 25.9–32.4)
MEAN CORPUSCULAR VOLUME: 80.3 fL (ref 77.6–95.7)
MEAN PLATELET VOLUME: 6.4 fL — ABNORMAL LOW (ref 6.8–10.7)
MONOCYTES ABSOLUTE COUNT: 0.5 10*9/L (ref 0.3–0.8)
MONOCYTES RELATIVE PERCENT: 6.1 %
NEUTROPHILS ABSOLUTE COUNT: 6.5 10*9/L (ref 1.8–7.8)
NEUTROPHILS RELATIVE PERCENT: 74.1 %
PLATELET COUNT: 279 10*9/L (ref 150–450)
RED BLOOD CELL COUNT: 4.67 10*12/L (ref 3.95–5.13)
RED CELL DISTRIBUTION WIDTH: 14.5 % (ref 12.2–15.2)
WBC ADJUSTED: 8.7 10*9/L (ref 3.6–11.2)

## 2023-04-07 LAB — COMPREHENSIVE METABOLIC PANEL
ALBUMIN: 4.1 g/dL (ref 3.4–5.0)
ALKALINE PHOSPHATASE: 79 U/L (ref 46–116)
ALT (SGPT): 44 U/L (ref 10–49)
ANION GAP: 8 mmol/L (ref 5–14)
AST (SGOT): 25 U/L (ref <=34)
BILIRUBIN TOTAL: 0.6 mg/dL (ref 0.3–1.2)
BLOOD UREA NITROGEN: 18 mg/dL (ref 9–23)
BUN / CREAT RATIO: 14
CALCIUM: 10.4 mg/dL (ref 8.7–10.4)
CHLORIDE: 99 mmol/L (ref 98–107)
CO2: 27.5 mmol/L (ref 20.0–31.0)
CREATININE: 1.29 mg/dL — ABNORMAL HIGH
EGFR CKD-EPI (2021) FEMALE: 53 mL/min/{1.73_m2} — ABNORMAL LOW (ref >=60)
GLUCOSE RANDOM: 88 mg/dL (ref 70–179)
POTASSIUM: 3.4 mmol/L (ref 3.4–4.8)
PROTEIN TOTAL: 7.1 g/dL (ref 5.7–8.2)
SODIUM: 134 mmol/L — ABNORMAL LOW (ref 135–145)

## 2023-04-07 LAB — MAGNESIUM: MAGNESIUM: 1.5 mg/dL — ABNORMAL LOW (ref 1.6–2.6)

## 2023-04-07 LAB — CMV DNA, QUANTITATIVE, PCR: CMV VIRAL LD: NOT DETECTED

## 2023-04-07 LAB — PHOSPHORUS: PHOSPHORUS: 2.8 mg/dL (ref 2.4–5.1)

## 2023-04-07 MED ADMIN — belatacept (NULOJIX) 512.5 mg in sodium chloride (NS) 0.9 % 100 mL IVPB: 5 mg/kg | INTRAVENOUS | @ 19:00:00 | Stop: 2023-04-07

## 2023-04-11 NOTE — Unmapped (Signed)
[facility] Refill Coordination Note    Kimberly [name], DOB: [birth year]  Phone: [phone] (home)       All above HIPAA information was verified with patient.         04/10/2023     4:41 PM   Specialty Rx Medication Refill Questionnaire   Which Medications would you like refilled and shipped? Prednisone  13 days, Mycophenolate 13 days, Calcitriol 13 days   Please list all current allergies: Demoral, cephalosporin   Have you missed any doses in the last 30 days? No   Have you had any changes to your medication(s) since your last refill? No   How many days remaining of each medication do you have at home? 13 days   Have you experienced any side effects in the last 30 days? No   Please enter the full address (street address, city, state, zip code) where you would like your medication(s) to be delivered to. [address], [name], [name] [address]   Please specify on which day you would like your medication(s) to arrive. Note: if you need your medication(s) within 3 days, please call the pharmacy to schedule your order at [phone]  04/18/2023   Has your insurance changed since your last refill? No   Would you like a pharmacist to call you to discuss your medication(s)? No   Do you require a signature for your package? (Note: if we are billing Medicare Part B or your order contains a controlled substance, we will require a signature) No         Completed refill call assessment today to schedule patient's medication shipment from the [facility] [phone]).  All relevant notes have been reviewed.       Confirmed patient received a Conservation officer, historic buildings and a Surveyor, mining with first shipment. The patient will receive a drug information handout for each medication shipped and additional FDA Medication Guides as required.         REFERRAL TO PHARMACIST     Referral to the pharmacist: Not needed      [facility]     Shipping address confirmed in Epic.     Delivery Scheduled: Yes, Expected medication delivery date: 04/18/2023.     Medication will be delivered via UPS to the prescription address in Epic [place].    Kimberly [name] Kimberly [name]   [facility] Specialty Technician

## 2023-04-17 MED FILL — MYCOPHENOLATE MOFETIL 500 MG TABLET: ORAL | 30 days supply | Qty: 120 | Fill #4

## 2023-04-17 MED FILL — PREDNISONE 2.5 MG TABLET: ORAL | 90 days supply | Qty: 90 | Fill #1

## 2023-04-17 MED FILL — CALCITRIOL 0.25 MCG CAPSULE: ORAL | 30 days supply | Qty: 30 | Fill #4

## 2023-04-29 DIAGNOSIS — Z79899 Other long term (current) drug therapy: Principal | ICD-10-CM

## 2023-04-29 DIAGNOSIS — Z940 Kidney transplant status: Principal | ICD-10-CM

## 2023-04-30 DIAGNOSIS — N185 Chronic kidney disease, stage 5: Principal | ICD-10-CM

## 2023-04-30 DIAGNOSIS — D631 Anemia in chronic kidney disease: Principal | ICD-10-CM

## 2023-05-05 ENCOUNTER — Ambulatory Visit: Admit: 2023-05-05 | Discharge: 2023-05-06 | Payer: MEDICARE

## 2023-05-05 LAB — PHOSPHORUS: PHOSPHORUS: 2.4 mg/dL (ref 2.4–5.1)

## 2023-05-05 LAB — CBC W/ AUTO DIFF
BASOPHILS ABSOLUTE COUNT: 0.1 10*9/L (ref 0.0–0.1)
BASOPHILS RELATIVE PERCENT: 1.3 %
EOSINOPHILS ABSOLUTE COUNT: 0.1 10*9/L (ref 0.0–0.5)
EOSINOPHILS RELATIVE PERCENT: 1.1 %
HEMATOCRIT: 36.7 % (ref 34.0–44.0)
HEMOGLOBIN: 12.3 g/dL (ref 11.3–14.9)
LYMPHOCYTES ABSOLUTE COUNT: 1.5 10*9/L (ref 1.1–3.6)
LYMPHOCYTES RELATIVE PERCENT: 19.3 %
MEAN CORPUSCULAR HEMOGLOBIN CONC: 33.5 g/dL (ref 32.0–36.0)
MEAN CORPUSCULAR HEMOGLOBIN: 27.2 pg (ref 25.9–32.4)
MEAN CORPUSCULAR VOLUME: 81.4 fL (ref 77.6–95.7)
MEAN PLATELET VOLUME: 6.4 fL — ABNORMAL LOW (ref 6.8–10.7)
MONOCYTES ABSOLUTE COUNT: 0.5 10*9/L (ref 0.3–0.8)
MONOCYTES RELATIVE PERCENT: 6.5 %
NEUTROPHILS ABSOLUTE COUNT: 5.7 10*9/L (ref 1.8–7.8)
NEUTROPHILS RELATIVE PERCENT: 71.8 %
PLATELET COUNT: 307 10*9/L (ref 150–450)
RED BLOOD CELL COUNT: 4.51 10*12/L (ref 3.95–5.13)
RED CELL DISTRIBUTION WIDTH: 14.7 % (ref 12.2–15.2)
WBC ADJUSTED: 7.9 10*9/L (ref 3.6–11.2)

## 2023-05-05 LAB — COMPREHENSIVE METABOLIC PANEL
ALBUMIN: 4 g/dL (ref 3.4–5.0)
ALKALINE PHOSPHATASE: 87 U/L (ref 46–116)
ALT (SGPT): 43 U/L (ref 10–49)
ANION GAP: 9 mmol/L (ref 5–14)
AST (SGOT): 31 U/L (ref <=34)
BILIRUBIN TOTAL: 0.4 mg/dL (ref 0.3–1.2)
BLOOD UREA NITROGEN: 13 mg/dL (ref 9–23)
BUN / CREAT RATIO: 11
CALCIUM: 10.4 mg/dL (ref 8.7–10.4)
CHLORIDE: 102 mmol/L (ref 98–107)
CO2: 25.2 mmol/L (ref 20.0–31.0)
CREATININE: 1.19 mg/dL — ABNORMAL HIGH
EGFR CKD-EPI (2021) FEMALE: 59 mL/min/{1.73_m2} — ABNORMAL LOW (ref >=60)
GLUCOSE RANDOM: 99 mg/dL (ref 70–179)
POTASSIUM: 3.7 mmol/L (ref 3.4–4.8)
PROTEIN TOTAL: 6.9 g/dL (ref 5.7–8.2)
SODIUM: 136 mmol/L (ref 135–145)

## 2023-05-05 LAB — MAGNESIUM: MAGNESIUM: 1.6 mg/dL (ref 1.6–2.6)

## 2023-05-05 MED ADMIN — belatacept (NULOJIX) 512.5 mg in sodium chloride (NS) 0.9 % 100 mL IVPB: 5 mg/kg | INTRAVENOUS | @ 19:00:00 | Stop: 2023-05-05

## 2023-05-05 NOTE — Unmapped (Signed)
Pt to [facility] for infusion of Belatacept. Pt denies new complaints/ infections. VSS  labs drawn .       1507 Belatacept 512.5 mg    1537 Belatacept complete     VSS- PIV removed per protocol. Discharged to home without issue.

## 2023-05-05 NOTE — Unmapped (Signed)
Error

## 2023-05-06 LAB — CMV DNA, QUANTITATIVE, PCR: CMV VIRAL LD: NOT DETECTED

## 2023-05-10 NOTE — Unmapped (Signed)
[facility] Refill Coordination Note    Kimberly [name], DOB: [birth year]  Phone: [phone] (home)       All above HIPAA information was verified with patient.         05/09/2023     5:08 PM   Specialty Rx Medication Refill Questionnaire   Which Medications would you like refilled and shipped? Mycophenolate 13 days remaining , Calcitriol 15 days remaining   Please list all current allergies: Demoral, cephalosporins   Have you missed any doses in the last 30 days? No   Have you had any changes to your medication(s) since your last refill? No   How many days remaining of each medication do you have at home? Mycophenolate 13 days remaining, Calcitriol 15 days remaining   Have you experienced any side effects in the last 30 days? No   Please enter the full address (street address, city, state, zip code) where you would like your medication(s) to be delivered to. [address], [name] [address]   Please specify on which day you would like your medication(s) to arrive. Note: if you need your medication(s) within 3 days, please call the pharmacy to schedule your order at [phone]  05/19/2023   Has your insurance changed since your last refill? No   Would you like a pharmacist to call you to discuss your medication(s)? No   Do you require a signature for your package? (Note: if we are billing Medicare Part B or your order contains a controlled substance, we will require a signature) No         Completed refill call assessment today to schedule patient's medication shipment from the [facility] [phone]).  All relevant notes have been reviewed.       Confirmed patient received a Conservation officer, historic buildings and a Surveyor, mining with first shipment. The patient will receive a drug information handout for each medication shipped and additional FDA Medication Guides as required.         REFERRAL TO PHARMACIST     Referral to the pharmacist: Not needed      [facility]     Shipping address confirmed in Epic.     Delivery Scheduled: Yes, Expected medication delivery date: 05/19/2023.     Medication will be delivered via Next Day Courier to the prescription address in Epic [place].    Derrika Ruffalo J Helane Gunther   [facility] Specialty Technician

## 2023-05-18 MED FILL — MYCOPHENOLATE MOFETIL 500 MG TABLET: ORAL | 30 days supply | Qty: 120 | Fill #5

## 2023-05-18 MED FILL — CALCITRIOL 0.25 MCG CAPSULE: ORAL | 90 days supply | Qty: 90 | Fill #5

## 2023-05-28 DIAGNOSIS — Z79899 Other long term (current) drug therapy: Principal | ICD-10-CM

## 2023-05-28 DIAGNOSIS — Z940 Kidney transplant status: Principal | ICD-10-CM

## 2023-05-28 DIAGNOSIS — D631 Anemia in chronic kidney disease: Principal | ICD-10-CM

## 2023-05-28 DIAGNOSIS — N185 Chronic kidney disease, stage 5: Principal | ICD-10-CM

## 2023-06-02 ENCOUNTER — Ambulatory Visit: Admit: 2023-06-02 | Discharge: 2023-06-03 | Payer: MEDICARE

## 2023-06-02 LAB — CBC W/ AUTO DIFF
BASOPHILS ABSOLUTE COUNT: 0.1 10*9/L (ref 0.0–0.1)
BASOPHILS RELATIVE PERCENT: 1 %
EOSINOPHILS ABSOLUTE COUNT: 0.1 10*9/L (ref 0.0–0.5)
EOSINOPHILS RELATIVE PERCENT: 1.4 %
HEMATOCRIT: 39 % (ref 34.0–44.0)
HEMOGLOBIN: 13 g/dL (ref 11.3–14.9)
LYMPHOCYTES ABSOLUTE COUNT: 1.5 10*9/L (ref 1.1–3.6)
LYMPHOCYTES RELATIVE PERCENT: 23.5 %
MEAN CORPUSCULAR HEMOGLOBIN CONC: 33.3 g/dL (ref 32.0–36.0)
MEAN CORPUSCULAR HEMOGLOBIN: 27 pg (ref 25.9–32.4)
MEAN CORPUSCULAR VOLUME: 81 fL (ref 77.6–95.7)
MEAN PLATELET VOLUME: 6.4 fL — ABNORMAL LOW (ref 6.8–10.7)
MONOCYTES ABSOLUTE COUNT: 0.6 10*9/L (ref 0.3–0.8)
MONOCYTES RELATIVE PERCENT: 9.1 %
NEUTROPHILS ABSOLUTE COUNT: 4.2 10*9/L (ref 1.8–7.8)
NEUTROPHILS RELATIVE PERCENT: 65 %
PLATELET COUNT: 295 10*9/L (ref 150–450)
RED BLOOD CELL COUNT: 4.81 10*12/L (ref 3.95–5.13)
RED CELL DISTRIBUTION WIDTH: 14.8 % (ref 12.2–15.2)
WBC ADJUSTED: 6.5 10*9/L (ref 3.6–11.2)

## 2023-06-02 LAB — COMPREHENSIVE METABOLIC PANEL
ALBUMIN: 4.1 g/dL (ref 3.4–5.0)
ALKALINE PHOSPHATASE: 81 U/L (ref 46–116)
ALT (SGPT): 39 U/L (ref 10–49)
ANION GAP: 8 mmol/L (ref 5–14)
AST (SGOT): 25 U/L (ref <=34)
BILIRUBIN TOTAL: 0.5 mg/dL (ref 0.3–1.2)
BLOOD UREA NITROGEN: 17 mg/dL (ref 9–23)
BUN / CREAT RATIO: 15
CALCIUM: 10.9 mg/dL — ABNORMAL HIGH (ref 8.7–10.4)
CHLORIDE: 101 mmol/L (ref 98–107)
CO2: 27.9 mmol/L (ref 20.0–31.0)
CREATININE: 1.17 mg/dL — ABNORMAL HIGH
EGFR CKD-EPI (2021) FEMALE: 60 mL/min/{1.73_m2} (ref >=60)
GLUCOSE RANDOM: 110 mg/dL (ref 70–179)
POTASSIUM: 3.4 mmol/L (ref 3.4–4.8)
PROTEIN TOTAL: 7 g/dL (ref 5.7–8.2)
SODIUM: 137 mmol/L (ref 135–145)

## 2023-06-02 LAB — PHOSPHORUS: PHOSPHORUS: 2.7 mg/dL (ref 2.4–5.1)

## 2023-06-02 LAB — MAGNESIUM: MAGNESIUM: 1.8 mg/dL (ref 1.6–2.6)

## 2023-06-02 MED ADMIN — belatacept (NULOJIX) 512.5 mg in sodium chloride (NS) 0.9 % 100 mL IVPB: 5 mg/kg | INTRAVENOUS | @ 19:00:00 | Stop: 2023-06-02

## 2023-06-02 NOTE — Unmapped (Signed)
Pt to [facility] for infusion of Belatacept. Pt denies new complaints/ infections. VSS  labs drawn .     1513  Belatacept 512.5  mg.    1543  Belatacept complete   VSS- PIV removed per protocol. Discharged to home without issue.

## 2023-06-03 LAB — CMV DNA, QUANTITATIVE, PCR: CMV VIRAL LD: NOT DETECTED

## 2023-06-14 NOTE — Unmapped (Signed)
[facility] Refill Coordination Note    Kimberly [name], DOB: 1981/09/13  Phone: [phone] (home)       All above HIPAA information was verified with patient.         06/13/2023    11:06 AM   Specialty Rx Medication Refill Questionnaire   Which Medications would you like refilled and shipped? Cellcept 11 days remaining , Cacitriol 15 days remaining   Please list all current allergies: Cephalosporin, demoral   Have you missed any doses in the last 30 days? No   Have you had any changes to your medication(s) since your last refill? No   How many days remaining of each medication do you have at home? Cellcept 11 days remaining,   Cacitriol 15 days remaining   Have you experienced any side effects in the last 30 days? No   Please enter the full address (street address, city, state, zip code) where you would like your medication(s) to be delivered to. [address] [name] [address]   Please specify on which day you would like your medication(s) to arrive. Note: if you need your medication(s) within 3 days, please call the pharmacy to schedule your order at [phone]  06/23/2023   Has your insurance changed since your last refill? No   Would you like a pharmacist to call you to discuss your medication(s)? No   Do you require a signature for your package? (Note: if we are billing Medicare Part B or your order contains a controlled substance, we will require a signature) No         Completed refill call assessment today to schedule patient's medication shipment from the [facility] [phone]).  All relevant notes have been reviewed.       Confirmed patient received a Conservation officer, historic buildings and a Surveyor, mining with first shipment. The patient will receive a drug information handout for each medication shipped and additional FDA Medication Guides as required.         REFERRAL TO PHARMACIST     Referral to the pharmacist: Not needed      [facility]     Shipping address confirmed in Epic.     Delivery Scheduled: Yes, Expected medication delivery date: 06/23/2023.     Medication will be delivered via UPS to the prescription address in [facility] [place].    Rana Adorno J Helane Gunther   [facility] Specialty Technician

## 2023-06-22 MED FILL — MYCOPHENOLATE MOFETIL 500 MG TABLET: ORAL | 30 days supply | Qty: 120 | Fill #6

## 2023-06-25 DIAGNOSIS — N185 Chronic kidney disease, stage 5: Principal | ICD-10-CM

## 2023-06-25 DIAGNOSIS — Z79899 Other long term (current) drug therapy: Principal | ICD-10-CM

## 2023-06-25 DIAGNOSIS — D631 Anemia in chronic kidney disease: Principal | ICD-10-CM

## 2023-06-25 DIAGNOSIS — Z940 Kidney transplant status: Principal | ICD-10-CM

## 2023-06-30 ENCOUNTER — Ambulatory Visit: Admit: 2023-06-30 | Discharge: 2023-07-01 | Payer: MEDICARE

## 2023-06-30 LAB — CBC W/ AUTO DIFF
BASOPHILS ABSOLUTE COUNT: 0.1 10*9/L (ref 0.0–0.1)
BASOPHILS RELATIVE PERCENT: 0.8 %
EOSINOPHILS ABSOLUTE COUNT: 0.1 10*9/L (ref 0.0–0.5)
EOSINOPHILS RELATIVE PERCENT: 0.9 %
HEMATOCRIT: 35 % (ref 34.0–44.0)
HEMOGLOBIN: 11.7 g/dL (ref 11.3–14.9)
LYMPHOCYTES ABSOLUTE COUNT: 1.3 10*9/L (ref 1.1–3.6)
LYMPHOCYTES RELATIVE PERCENT: 18.6 %
MEAN CORPUSCULAR HEMOGLOBIN CONC: 33.5 g/dL (ref 32.0–36.0)
MEAN CORPUSCULAR HEMOGLOBIN: 27.2 pg (ref 25.9–32.4)
MEAN CORPUSCULAR VOLUME: 81.1 fL (ref 77.6–95.7)
MEAN PLATELET VOLUME: 6.5 fL — ABNORMAL LOW (ref 6.8–10.7)
MONOCYTES ABSOLUTE COUNT: 0.3 10*9/L (ref 0.3–0.8)
MONOCYTES RELATIVE PERCENT: 5 %
NEUTROPHILS ABSOLUTE COUNT: 5.2 10*9/L (ref 1.8–7.8)
NEUTROPHILS RELATIVE PERCENT: 74.7 %
PLATELET COUNT: 321 10*9/L (ref 150–450)
RED BLOOD CELL COUNT: 4.31 10*12/L (ref 3.95–5.13)
RED CELL DISTRIBUTION WIDTH: 14.7 % (ref 12.2–15.2)
WBC ADJUSTED: 7 10*9/L (ref 3.6–11.2)

## 2023-06-30 LAB — COMPREHENSIVE METABOLIC PANEL
ALBUMIN: 4.2 g/dL (ref 3.4–5.0)
ALKALINE PHOSPHATASE: 82 U/L (ref 46–116)
ALT (SGPT): 62 U/L — ABNORMAL HIGH (ref 10–49)
ANION GAP: 8 mmol/L (ref 5–14)
AST (SGOT): 34 U/L (ref <=34)
BILIRUBIN TOTAL: 0.4 mg/dL (ref 0.3–1.2)
BLOOD UREA NITROGEN: 20 mg/dL (ref 9–23)
BUN / CREAT RATIO: 17
CALCIUM: 10.3 mg/dL (ref 8.7–10.4)
CHLORIDE: 103 mmol/L (ref 98–107)
CO2: 26.9 mmol/L (ref 20.0–31.0)
CREATININE: 1.21 mg/dL — ABNORMAL HIGH
EGFR CKD-EPI (2021) FEMALE: 57 mL/min/{1.73_m2} — ABNORMAL LOW (ref >=60)
GLUCOSE RANDOM: 86 mg/dL (ref 70–179)
POTASSIUM: 3.1 mmol/L — ABNORMAL LOW (ref 3.4–4.8)
PROTEIN TOTAL: 6.9 g/dL (ref 5.7–8.2)
SODIUM: 138 mmol/L (ref 135–145)

## 2023-06-30 LAB — PHOSPHORUS: PHOSPHORUS: 2.1 mg/dL — ABNORMAL LOW (ref 2.4–5.1)

## 2023-06-30 LAB — MAGNESIUM: MAGNESIUM: 1.6 mg/dL (ref 1.6–2.6)

## 2023-06-30 MED ADMIN — belatacept (NULOJIX) 512.5 mg in sodium chloride (NS) 0.9 % 100 mL IVPB: 5 mg/kg | INTRAVENOUS | @ 19:00:00 | Stop: 2023-06-30

## 2023-06-30 NOTE — Unmapped (Signed)
[facility], S/P Kidney Transplant.  Patient has received previous doses of Belatacept in the Transplant Clinic and tolerated them well. Patient denies any new concerns or questions, denies any changes to her medical history, medications and allergies reviewed by Clinical research associate. 24G PIV placed RAC, patient tolerated well, labs obtained per standing orders, no premeds ordered. Call light at patient's side, pt instructed on how to use.     1438 Belatacept 512.5 mg in 100 ml started per provider order     1509 Belatacept complete.      Patient tolerated infusion with no ill effects, PIV flushed with normal saline per infusion protocol, PIV D/C'd, gauze and coban applied. Vitals stable and comparable to baseline. Patient discharged from Infusion Center in stable condition with no acute distress. Next infusion appointment made for four weeks.

## 2023-07-01 LAB — CMV DNA, QUANTITATIVE, PCR: CMV VIRAL LD: NOT DETECTED

## 2023-07-04 NOTE — Unmapped (Signed)
Message sent via MyChart to increase potassium and phosphorus rich foods/drinks. Repeating labs with next infusion.

## 2023-07-12 NOTE — Unmapped (Signed)
[facility] Refill Coordination Note    Specialty Medication(s) to be Shipped:   Transplant: mycophenolate mofetil 500mg     Other medication(s) to be shipped: No additional medications requested for fill at this time     Kimberly [name], DOB: 09-27-1981  Phone: [phone] (home)       All above HIPAA information was verified with patient.     Was a Nurse, learning disability used for this call? No    Completed refill call assessment today to schedule patient's medication shipment from the [facility]  [phone]).  All relevant notes have been reviewed.     Specialty medication(s) and dose(s) confirmed: Regimen is correct and unchanged.   Changes to medications: [name] reports no changes at this time.  Changes to insurance: No  New side effects reported not previously addressed with a pharmacist or physician: None reported  Questions for the pharmacist: No    Confirmed patient received a Conservation officer, historic buildings and a Surveyor, mining with first shipment. The patient will receive a drug information handout for each medication shipped and additional FDA Medication Guides as required.       DISEASE/MEDICATION-SPECIFIC INFORMATION        N/A    SPECIALTY MEDICATION ADHERENCE     Medication Adherence    Patient reported X missed doses in the last month: 0  Specialty Medication: mycophenolate 500 mg tablet (CELLCEPT)              Were doses missed due to medication being on hold? No    mycophenolate 500 mg: 10 days of medicine on hand       REFERRAL TO PHARMACIST     Referral to the pharmacist: Not needed      [facility]     Shipping address confirmed in Epic.       Delivery Scheduled: Yes, Expected medication delivery date: 9/25.     Medication will be delivered via UPS to the prescription address in Epic [place].    [name], PharmD   [facility]  Specialty Pharmacist

## 2023-07-18 MED FILL — MYCOPHENOLATE MOFETIL 500 MG TABLET: ORAL | 30 days supply | Qty: 120 | Fill #7

## 2023-07-28 ENCOUNTER — Ambulatory Visit: Admit: 2023-07-28 | Discharge: 2023-07-28 | Payer: MEDICARE | Attending: Nephrology | Primary: Nephrology

## 2023-07-28 ENCOUNTER — Ambulatory Visit: Admit: 2023-07-28 | Discharge: 2023-07-28 | Payer: MEDICARE

## 2023-07-28 DIAGNOSIS — D638 Anemia in other chronic diseases classified elsewhere: Principal | ICD-10-CM

## 2023-07-28 DIAGNOSIS — Z940 Kidney transplant status: Principal | ICD-10-CM

## 2023-07-28 LAB — PHOSPHORUS: PHOSPHORUS: 2.7 mg/dL (ref 2.4–5.1)

## 2023-07-28 LAB — IRON & TIBC
IRON SATURATION: 9 % — ABNORMAL LOW (ref 20–55)
IRON: 30 ug/dL — ABNORMAL LOW
TOTAL IRON BINDING CAPACITY: 330 ug/dL (ref 250–425)

## 2023-07-28 LAB — CBC W/ AUTO DIFF
BASOPHILS ABSOLUTE COUNT: 0 10*9/L (ref 0.0–0.1)
BASOPHILS RELATIVE PERCENT: 0.6 %
EOSINOPHILS ABSOLUTE COUNT: 0.1 10*9/L (ref 0.0–0.5)
EOSINOPHILS RELATIVE PERCENT: 1 %
HEMATOCRIT: 37.2 % (ref 34.0–44.0)
HEMOGLOBIN: 12.2 g/dL (ref 11.3–14.9)
LYMPHOCYTES ABSOLUTE COUNT: 1.5 10*9/L (ref 1.1–3.6)
LYMPHOCYTES RELATIVE PERCENT: 17.3 %
MEAN CORPUSCULAR HEMOGLOBIN CONC: 32.8 g/dL (ref 32.0–36.0)
MEAN CORPUSCULAR HEMOGLOBIN: 26.3 pg (ref 25.9–32.4)
MEAN CORPUSCULAR VOLUME: 80.4 fL (ref 77.6–95.7)
MEAN PLATELET VOLUME: 6.3 fL — ABNORMAL LOW (ref 6.8–10.7)
MONOCYTES ABSOLUTE COUNT: 0.6 10*9/L (ref 0.3–0.8)
MONOCYTES RELATIVE PERCENT: 6.4 %
NEUTROPHILS ABSOLUTE COUNT: 6.5 10*9/L (ref 1.8–7.8)
NEUTROPHILS RELATIVE PERCENT: 74.7 %
PLATELET COUNT: 309 10*9/L (ref 150–450)
RED BLOOD CELL COUNT: 4.62 10*12/L (ref 3.95–5.13)
RED CELL DISTRIBUTION WIDTH: 14.3 % (ref 12.2–15.2)
WBC ADJUSTED: 8.7 10*9/L (ref 3.6–11.2)

## 2023-07-28 LAB — COMPREHENSIVE METABOLIC PANEL
ALBUMIN: 4.3 g/dL (ref 3.4–5.0)
ALKALINE PHOSPHATASE: 78 U/L (ref 46–116)
ALT (SGPT): 43 U/L (ref 10–49)
ANION GAP: 10 mmol/L (ref 5–14)
AST (SGOT): 24 U/L (ref <=34)
BILIRUBIN TOTAL: 0.5 mg/dL (ref 0.3–1.2)
BLOOD UREA NITROGEN: 19 mg/dL (ref 9–23)
BUN / CREAT RATIO: 11
CALCIUM: 10.8 mg/dL — ABNORMAL HIGH (ref 8.7–10.4)
CHLORIDE: 99 mmol/L (ref 98–107)
CO2: 28.2 mmol/L (ref 20.0–31.0)
CREATININE: 1.66 mg/dL — ABNORMAL HIGH
EGFR CKD-EPI (2021) FEMALE: 39 mL/min/{1.73_m2} — ABNORMAL LOW (ref >=60)
GLUCOSE RANDOM: 109 mg/dL (ref 70–179)
POTASSIUM: 3.3 mmol/L — ABNORMAL LOW (ref 3.4–4.8)
PROTEIN TOTAL: 7.2 g/dL (ref 5.7–8.2)
SODIUM: 137 mmol/L (ref 135–145)

## 2023-07-28 LAB — FERRITIN: FERRITIN: 57.9 ng/mL

## 2023-07-28 LAB — CMV DNA, QUANTITATIVE, PCR
CMV QUANT: <35 [IU]/mL — ABNORMAL HIGH (ref <0)
CMV VIRAL LD: DETECTED — AB

## 2023-07-28 LAB — MAGNESIUM: MAGNESIUM: 1.8 mg/dL (ref 1.6–2.6)

## 2023-07-28 MED ADMIN — belatacept (NULOJIX) 500 mg in sodium chloride (NS) 0.9 % 100 mL IVPB: 5 mg/kg | INTRAVENOUS | @ 18:00:00 | Stop: 2023-07-28

## 2023-07-28 NOTE — Unmapped (Signed)
[facility] Bel[facility]pt infusion, S/P Kidney Transplant. Patient denies any new concerns or questions, denies any changes to her medical history, medications and allergies reviewed by Clinical research associate. 24G PIV placed RAC, patient tolerated well, labs obtained per standing orders, no premeds ordered. Call light at patient's side, pt instructed on how to use.     1423 Belatacept 500 mg in 100 ml started per provider order     1454 Belatacept complete.      Patient tolerated infusion with no ill effects, PIV flushed with normal saline per infusion protocol, PIV D/C'd, gauze and coban applied. Vitals stable and comparable to baseline. Patient discharged from Infusion Center in stable condition with no acute distress. Next infusion appointment made for four weeks.

## 2023-07-28 NOTE — Unmapped (Signed)
Transplant Nephrology Clinic Visit    History of Present Illness    42 y.o. female here for follow up after kidney transplantation.      Transplant History:    Date of Transplant: Dec 16, 2018  Organ Received: deceased donor kidney transplant, KDPI 39%  Native Kidney Disease: reflux nephropathy  Post-Transplant Course: recurrent UTIs, has seen Urogyn and started on D-mannose with no further UTIs.  Prior Transplants: 1st kidney transplant - 1995 -1997 - LR failed due to thrombotic issues.  2nd kidney transplant - 2000-2002 - DD failed chronic rejection.  3rd kidney transplant - 2004 - 2007 - DD failed in 2007 chronic rejection.  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  No pain urinating, no trouble passing urine, no visible blood in urine.  The patient reports they are able to obtain and take their immunosuppressants without issues.        Review of Systems    Otherwise as per HPI, all other systems reviewed and are negative.    Past Medical History, Surgical History, Family History, and Social History reviewed in electronic record        Medications  Current Outpatient Medications   Medication Sig Dispense Refill    acetaminophen (TYLENOL) 500 MG tablet Take 2 tablets (1,000 mg total) by mouth every six (6) hours as needed for pain. 100 tablet 0    ascorbic acid, vitamin C, (VITAMIN C) 500 MG tablet Take 1 tablet (500 mg total) by mouth daily. 30 tablet 11    calcitriol (ROCALTROL) 0.25 MCG capsule Take 1 capsule (0.25 mcg total) by mouth daily. 30 capsule 11    carboxymethylcellulose sodium (REFRESH CELLUVISC) 1 % DpGe Instill drops in affected eye(s) as directed as needed.      chlorthalidone (HYGROTON) 25 MG tablet Take 1 tablet (25 mg total) by mouth daily. 90 tablet 3    cranberry-vitamin C-mannose 250-30-50 mg Chew Chew 1 tablet  in the morning.      ezetimibe (ZETIA) 10 mg tablet Take 1 tablet (10 mg total) by mouth daily. 90 tablet 3    ferrous sulfate 325 (65 FE) MG tablet Take 1 tablet (325 mg total) by mouth in the morning. 30 tablet 11    mycophenolate (CELLCEPT) 500 mg tablet Take 2 tablets (1,000 mg total) by mouth Two (2) times a day. 120 tablet 11    omega-3 acid ethyl esters (LOVAZA) 1 gram capsule Take 1 capsule (1 g total) by mouth two (2) times a day. E78.1 hypertriglyceridemia      omeprazole (PRILOSEC) 40 MG capsule TAKE 1 CAPSULE (40 MG TOTAL) BY MOUTH DAILY. 90 capsule 1    predniSONE (DELTASONE) 2.5 MG tablet Take 1 tablet (2.5 mg total) by mouth daily. 90 tablet 3    sertraline (ZOLOFT) 100 MG tablet Take 1 tablet (100 mg total) by mouth daily. 90 tablet 3  aspirin (ECOTRIN) 81 MG tablet Take 1 tablet (81 mg total) by mouth daily. 30 tablet 0     No current facility-administered medications for this visit.           Physical Exam  BP 123/78 (BP Site: R Arm, BP Position: Sitting, BP Cuff Size: Large)  - Pulse 85  - Temp 36 ??C (96.8 ??F) (Temporal)  - Ht 162.6 cm (5' 4.02)  - Wt (!) 101.2 kg (223 lb)  - BMI 38.26 kg/m??   General: no acute distress  HEENT: mucous membranes moist  Neck: neck supple, no cervical lymphadenopathy appreciated  CV: normal rate, normal rhythm, no murmur, no gallops, no rubs appreciated  Lungs: clear to auscultation bilaterally  Abdomen: soft, non tender,   Extremities:  no lower extremity edema  Musculoskeletal: no visible deformity, normal range of motion.  Pulses: intact distally throughout  Neurologic: awake, alert, and oriented x3      Laboratory Data and Imaging reviewed in EPIC      Assessment:      ICD-10-CM   1. Kidney replaced by transplant [Z94.0]  Z94.0   2. Anemia of chronic disease  D63.8   3. Immunosuppressive management encounter following kidney transplant  Z79.899    Z94.0   4. 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 AKI (acute kidney injury) (CMS-HCC)  N17.9           Plan:     Status Post Kidney Transplant, Allograft function: Cr up to 1.66 from 1.21 on 06/30/23.  No DSAs in March 2024. Plan to repeat labs early next week including UPCR, BK PCR, new DSA panel. Will consider kidney biopsy if renal function does not improve or there are other concerning findings.    Immunosuppression [High Risk Medical Decision Making For Drug Therapy Requiring Intensive Monitoring For Toxicity]: reduce prednisone dose to 2.5 mg daily, continue belatacept infusions and current immunosuppression dosing.    Other interventions:  -Primary hypertension: BP is at goal, continue chlorthalidone  -left upper extremity lymphedema: due to old clotted LUE AVF, previously referred to transplant surgery to evaluate for ligation but patient later declined referral.          Counseling:  I counseled the patient on:  The need to avoid sun exposure and the use of sunblock while outdoors given the relatively higher risk of skin malignancy in an immunosuppressed state.  The need for adherence to immunosuppression medication.  Patient verbalized understanding.     Follow-Up:  Return in about 6 months (around 01/26/2024).    Patient will continue to follow-up with her primary care provider for non-transplant related issues and medication refills. We have ordered transplant specific labs per the center's guidelines to monitor and assess for toxicities from immunosuppressant drug therapy          Clelia Croft, DO  Transplant Nephrology  [facility] Division of Nephrology and Hypertension  07/28/2023  1:22 PM

## 2023-07-28 NOTE — Unmapped (Signed)
Saw patient in clinic    Getting Kandis Nab today    BP good at home 123/78 in clinic    Drinking good about 2 liters at home    Sleeping OK    Appetite good    Energy good    Denies HA, dizziness, CP, heart palpations, CP, SOB, abdominal pain, n/v/d, urinary problems, swelling, tremors, or fevers

## 2023-07-31 DIAGNOSIS — Z79899 Other long term (current) drug therapy: Principal | ICD-10-CM

## 2023-07-31 DIAGNOSIS — Z940 Kidney transplant status: Principal | ICD-10-CM

## 2023-07-31 NOTE — Unmapped (Signed)
Left detailed VM asking patient to repeat labs tomorrow or Wednesday at [facility] to monitor Cr and include BK PCR, UPCR, and DSA per Dr. Gwynneth Munson. May consider kidney biopsy if creatinine is still elevated.

## 2023-08-02 ENCOUNTER — Encounter: Admit: 2023-08-02 | Discharge: 2023-08-02 | Payer: MEDICARE | Attending: Nephrology | Primary: Nephrology

## 2023-08-02 ENCOUNTER — Ambulatory Visit: Admit: 2023-08-02 | Discharge: 2023-08-02 | Payer: MEDICARE

## 2023-08-02 LAB — LIPID PANEL
CHOLESTEROL/HDL RATIO SCREEN: 4.9 — ABNORMAL HIGH (ref 1.0–4.5)
CHOLESTEROL: 197 mg/dL (ref <=200)
HDL CHOLESTEROL: 40 mg/dL (ref 40–60)
LDL CHOLESTEROL CALCULATED: 112 mg/dL — ABNORMAL HIGH (ref 40–99)
NON-HDL CHOLESTEROL: 157 mg/dL — ABNORMAL HIGH (ref 70–130)
TRIGLYCERIDES: 225 mg/dL — ABNORMAL HIGH (ref 0–150)
VLDL CHOLESTEROL CAL: 45 mg/dL — ABNORMAL HIGH (ref 9–37)

## 2023-08-02 LAB — URINALYSIS WITH MICROSCOPY
BACTERIA: NONE SEEN /HPF
BILIRUBIN UA: NEGATIVE
GLUCOSE UA: NEGATIVE
KETONES UA: NEGATIVE
LEUKOCYTE ESTERASE UA: NEGATIVE
NITRITE UA: NEGATIVE
PH UA: 6 (ref 5.0–9.0)
PROTEIN UA: NEGATIVE
RBC UA: >182 /HPF — ABNORMAL HIGH (ref <=4)
SPECIFIC GRAVITY UA: 1.009 (ref 1.003–1.030)
SQUAMOUS EPITHELIAL: <1 /HPF (ref 0–5)
UROBILINOGEN UA: <2
WBC UA: 8 /HPF — ABNORMAL HIGH (ref 0–5)

## 2023-08-02 LAB — CBC W/ AUTO DIFF
BASOPHILS ABSOLUTE COUNT: 0.1 10*9/L (ref 0.0–0.1)
BASOPHILS RELATIVE PERCENT: 1.1 %
EOSINOPHILS ABSOLUTE COUNT: 0.1 10*9/L (ref 0.0–0.5)
EOSINOPHILS RELATIVE PERCENT: 1.4 %
HEMATOCRIT: 37.2 % (ref 34.0–44.0)
HEMOGLOBIN: 12.3 g/dL (ref 11.3–14.9)
LYMPHOCYTES ABSOLUTE COUNT: 1.5 10*9/L (ref 1.1–3.6)
LYMPHOCYTES RELATIVE PERCENT: 22.7 %
MEAN CORPUSCULAR HEMOGLOBIN CONC: 33 g/dL (ref 32.0–36.0)
MEAN CORPUSCULAR HEMOGLOBIN: 26.5 pg (ref 25.9–32.4)
MEAN CORPUSCULAR VOLUME: 80.4 fL (ref 77.6–95.7)
MEAN PLATELET VOLUME: 6.1 fL — ABNORMAL LOW (ref 6.8–10.7)
MONOCYTES ABSOLUTE COUNT: 0.5 10*9/L (ref 0.3–0.8)
MONOCYTES RELATIVE PERCENT: 7.5 %
NEUTROPHILS ABSOLUTE COUNT: 4.5 10*9/L (ref 1.8–7.8)
NEUTROPHILS RELATIVE PERCENT: 67.3 %
NUCLEATED RED BLOOD CELLS: 0 /100{WBCs} (ref <=4)
PLATELET COUNT: 332 10*9/L (ref 150–450)
RED BLOOD CELL COUNT: 4.63 10*12/L (ref 3.95–5.13)
RED CELL DISTRIBUTION WIDTH: 14.4 % (ref 12.2–15.2)
WBC ADJUSTED: 6.7 10*9/L (ref 3.6–11.2)

## 2023-08-02 LAB — ALBUMIN / CREATININE URINE RATIO
ALBUMIN QUANT URINE: 7.9 mg/dL
ALBUMIN/CREATININE RATIO: 121.9 ug/mg — ABNORMAL HIGH (ref 0.0–30.0)
CREATININE, URINE: 64.8 mg/dL

## 2023-08-02 LAB — COMPREHENSIVE METABOLIC PANEL
ALBUMIN: 4.3 g/dL (ref 3.4–5.0)
ALKALINE PHOSPHATASE: 72 U/L (ref 46–116)
ALT (SGPT): 36 U/L (ref 10–49)
ANION GAP: 7 mmol/L (ref 5–14)
AST (SGOT): 24 U/L (ref <=34)
BILIRUBIN TOTAL: 0.4 mg/dL (ref 0.3–1.2)
BLOOD UREA NITROGEN: 18 mg/dL (ref 9–23)
BUN / CREAT RATIO: 13
CALCIUM: 10.4 mg/dL (ref 8.7–10.4)
CHLORIDE: 105 mmol/L (ref 98–107)
CO2: 27.9 mmol/L (ref 20.0–31.0)
CREATININE: 1.34 mg/dL — ABNORMAL HIGH
EGFR CKD-EPI (2021) FEMALE: 51 mL/min/{1.73_m2} — ABNORMAL LOW (ref >=60)
GLUCOSE RANDOM: 91 mg/dL (ref 70–99)
POTASSIUM: 3.6 mmol/L (ref 3.4–4.8)
PROTEIN TOTAL: 7 g/dL (ref 5.7–8.2)
SODIUM: 140 mmol/L (ref 135–145)

## 2023-08-02 LAB — PROTEIN / CREATININE RATIO, URINE
CREATININE, URINE: 65.6 mg/dL
PROTEIN URINE: 14 mg/dL
PROTEIN/CREAT RATIO, URINE: 0.213

## 2023-08-02 LAB — CMV DNA, QUANTITATIVE, PCR: CMV VIRAL LD: NOT DETECTED

## 2023-08-02 LAB — MAGNESIUM: MAGNESIUM: 1.7 mg/dL (ref 1.6–2.6)

## 2023-08-02 LAB — PHOSPHORUS: PHOSPHORUS: 2.2 mg/dL — ABNORMAL LOW (ref 2.4–5.1)

## 2023-08-03 LAB — BK VIRUS QUANTITATIVE PCR, BLOOD: BK BLOOD RESULT: NOT DETECTED

## 2023-08-10 LAB — FSAB CLASS 2 ANTIBODY SPECIFICITY: HLA CL2 AB RESULT: NEGATIVE

## 2023-08-10 LAB — FSAB CLASS 1 ANTIBODY SPECIFICITY: HLA CLASS 1 ANTIBODY RESULT: POSITIVE

## 2023-08-10 LAB — HLA DS POST TRANSPLANT
ANTI-DONOR DRW #1 MFI: 434 MFI
ANTI-DONOR DRW #2 MFI: 81 MFI
ANTI-DONOR HLA-A #1 MFI: 83 MFI
ANTI-DONOR HLA-A #2 MFI: 53 MFI
ANTI-DONOR HLA-B #1 MFI: 450 MFI
ANTI-DONOR HLA-B #2 MFI: 29 MFI
ANTI-DONOR HLA-C #1 MFI: 0 MFI
ANTI-DONOR HLA-DQB #1 MFI: 0 MFI
ANTI-DONOR HLA-DQB #2 MFI: 108 MFI
ANTI-DONOR HLA-DR #1 MFI: 23 MFI
ANTI-DONOR HLA-DR #2 MFI: 63 MFI

## 2023-08-14 DIAGNOSIS — Z940 Kidney transplant status: Principal | ICD-10-CM

## 2023-08-14 DIAGNOSIS — Z79899 Other long term (current) drug therapy: Principal | ICD-10-CM

## 2023-08-14 MED ORDER — CALCITRIOL 0.25 MCG CAPSULE
ORAL_CAPSULE | Freq: Every day | ORAL | 3 refills | 90 days | Status: CP
Start: 2023-08-14 — End: 2024-08-13

## 2023-08-14 MED ORDER — PREDNISONE 2.5 MG TABLET
ORAL_TABLET | Freq: Every day | ORAL | 3 refills | 90 days | Status: CP
Start: 2023-08-14 — End: 2024-08-13

## 2023-08-14 MED ORDER — MYCOPHENOLATE MOFETIL 500 MG TABLET
ORAL_TABLET | Freq: Two times a day (BID) | ORAL | 3 refills | 90 days | Status: CP
Start: 2023-08-14 — End: 2024-08-13

## 2023-08-17 NOTE — Unmapped (Signed)
The [facility] has made a second and final attempt to reach this patient to refill the following medication:mycophenolate 500 mg tablet (CELLCEPT) .      We have left voicemails on the following phone numbers: [phone] , have sent a MyChart message, and have sent a text message to the following phone numbers: [phone] .    Dates contacted: 10/11,10/24  Last scheduled delivery: 07/18/23    The patient may be at risk of non-compliance with this medication. The patient should call the [facility] at [phone]  Option 4, then Option 4: Infectious Disease, Transplant to refill medication.    Jorje Guild Specialty and Home Delivery Oncologist

## 2023-08-18 DIAGNOSIS — Z940 Kidney transplant status: Principal | ICD-10-CM

## 2023-08-18 MED ORDER — MYCOPHENOLATE MOFETIL 500 MG TABLET
ORAL_TABLET | Freq: Two times a day (BID) | ORAL | 0 refills | 10 days | Status: CP
Start: 2023-08-18 — End: 2023-08-28

## 2023-08-18 NOTE — Unmapped (Signed)
Specialty Medication(s): mycophenolate    Kimberly [name] has been dis-enrolled from the [facility] specialty pharmacy services due to a pharmacy change. The patient is now filling at [facility] .    Additional information provided to the patient: n/a    Tera Helper, [facility]  [facility] Specialty Pharmacist

## 2023-08-25 ENCOUNTER — Ambulatory Visit: Admit: 2023-08-25 | Discharge: 2023-08-25 | Payer: MEDICARE

## 2023-08-25 DIAGNOSIS — Z940 Kidney transplant status: Principal | ICD-10-CM

## 2023-08-25 DIAGNOSIS — Z79899 Other long term (current) drug therapy: Principal | ICD-10-CM

## 2023-08-29 DIAGNOSIS — Z940 Kidney transplant status: Principal | ICD-10-CM

## 2023-09-14 DIAGNOSIS — Z940 Kidney transplant status: Principal | ICD-10-CM

## 2023-09-24 DIAGNOSIS — Z940 Kidney transplant status: Principal | ICD-10-CM

## 2023-09-25 ENCOUNTER — Ambulatory Visit: Admit: 2023-09-25 | Discharge: 2023-09-26 | Payer: MEDICARE

## 2023-09-26 DIAGNOSIS — E876 Hypokalemia: Principal | ICD-10-CM

## 2023-09-26 DIAGNOSIS — Z940 Kidney transplant status: Principal | ICD-10-CM

## 2023-09-26 MED ORDER — POTASSIUM CHLORIDE ER 20 MEQ TABLET,EXTENDED RELEASE(PART/CRYST)
ORAL_TABLET | Freq: Every day | ORAL | 11 refills | 30 days | Status: CP
Start: 2023-09-26 — End: 2024-09-25

## 2023-10-23 ENCOUNTER — Encounter: Admit: 2023-10-23 | Discharge: 2023-10-24 | Payer: MEDICARE

## 2023-11-10 DIAGNOSIS — Z940 Kidney transplant status: Principal | ICD-10-CM

## 2023-11-10 DIAGNOSIS — Z79899 Other long term (current) drug therapy: Principal | ICD-10-CM

## 2023-11-15 DIAGNOSIS — R609 Edema, unspecified: Principal | ICD-10-CM

## 2023-11-15 DIAGNOSIS — Z940 Kidney transplant status: Principal | ICD-10-CM

## 2023-11-15 MED ORDER — CHLORTHALIDONE 25 MG TABLET
ORAL_TABLET | Freq: Every day | ORAL | 3 refills | 90.00 days | Status: CP
Start: 2023-11-15 — End: 2024-11-14

## 2023-11-17 ENCOUNTER — Encounter: Admit: 2023-11-17 | Discharge: 2023-11-18 | Payer: MEDICARE

## 2023-11-17 DIAGNOSIS — Z79899 Other long term (current) drug therapy: Principal | ICD-10-CM

## 2023-11-17 DIAGNOSIS — Z940 Kidney transplant status: Principal | ICD-10-CM

## 2023-12-12 DIAGNOSIS — Z940 Kidney transplant status: Principal | ICD-10-CM

## 2023-12-12 DIAGNOSIS — Z79899 Other long term (current) drug therapy: Principal | ICD-10-CM

## 2023-12-19 DIAGNOSIS — Z940 Kidney transplant status: Principal | ICD-10-CM

## 2023-12-22 ENCOUNTER — Encounter: Admit: 2023-12-22 | Discharge: 2023-12-23 | Payer: MEDICARE

## 2024-01-07 DIAGNOSIS — Z79899 Other long term (current) drug therapy: Principal | ICD-10-CM

## 2024-01-07 DIAGNOSIS — Z940 Kidney transplant status: Principal | ICD-10-CM

## 2024-01-19 ENCOUNTER — Encounter: Admit: 2024-01-19 | Discharge: 2024-01-20

## 2024-01-19 DIAGNOSIS — Z940 Kidney transplant status: Principal | ICD-10-CM

## 2024-01-19 DIAGNOSIS — Z79899 Other long term (current) drug therapy: Principal | ICD-10-CM

## 2024-01-26 DIAGNOSIS — R799 Abnormal finding of blood chemistry, unspecified: Principal | ICD-10-CM

## 2024-01-26 DIAGNOSIS — R8279 Other abnormal findings on microbiological examination of urine: Principal | ICD-10-CM

## 2024-01-26 DIAGNOSIS — Z940 Kidney transplant status: Principal | ICD-10-CM

## 2024-01-31 ENCOUNTER — Ambulatory Visit: Admit: 2024-01-31 | Discharge: 2024-01-31 | Attending: Nephrology | Primary: Nephrology

## 2024-01-31 ENCOUNTER — Inpatient Hospital Stay: Admit: 2024-01-31 | Discharge: 2024-01-31

## 2024-01-31 DIAGNOSIS — Z79899 Other long term (current) drug therapy: Principal | ICD-10-CM

## 2024-01-31 DIAGNOSIS — D84821 Immunosuppression due to drug therapy: Principal | ICD-10-CM

## 2024-01-31 DIAGNOSIS — I890 Lymphedema, not elsewhere classified: Principal | ICD-10-CM

## 2024-01-31 DIAGNOSIS — T82898D Other specified complication of vascular prosthetic devices, implants and grafts, subsequent encounter: Principal | ICD-10-CM

## 2024-01-31 DIAGNOSIS — R8279 Other abnormal findings on microbiological examination of urine: Principal | ICD-10-CM

## 2024-01-31 DIAGNOSIS — Z940 Kidney transplant status: Principal | ICD-10-CM

## 2024-02-04 DIAGNOSIS — Z79899 Other long term (current) drug therapy: Principal | ICD-10-CM

## 2024-02-04 DIAGNOSIS — Z940 Kidney transplant status: Principal | ICD-10-CM

## 2024-02-11 DIAGNOSIS — Z940 Kidney transplant status: Principal | ICD-10-CM

## 2024-02-11 DIAGNOSIS — Z79899 Other long term (current) drug therapy: Principal | ICD-10-CM

## 2024-02-11 MED ORDER — EZETIMIBE 10 MG TABLET
ORAL_TABLET | Freq: Every day | ORAL | 3 refills | 0.00 days
Start: 2024-02-11 — End: ?

## 2024-02-11 MED ORDER — SERTRALINE 100 MG TABLET
ORAL_TABLET | Freq: Every day | ORAL | 3 refills | 0.00 days
Start: 2024-02-11 — End: ?

## 2024-02-12 DIAGNOSIS — Z940 Kidney transplant status: Principal | ICD-10-CM

## 2024-02-12 MED ORDER — EZETIMIBE 10 MG TABLET
ORAL_TABLET | Freq: Every day | ORAL | 3 refills | 90.00 days | Status: CP
Start: 2024-02-12 — End: ?

## 2024-02-12 MED ORDER — SERTRALINE 100 MG TABLET
ORAL_TABLET | Freq: Every day | ORAL | 3 refills | 90.00 days | Status: CP
Start: 2024-02-12 — End: ?

## 2024-02-16 ENCOUNTER — Encounter: Admit: 2024-02-16 | Discharge: 2024-02-17 | Payer: MEDICARE

## 2024-02-16 DIAGNOSIS — Z940 Kidney transplant status: Principal | ICD-10-CM

## 2024-02-22 DIAGNOSIS — Z940 Kidney transplant status: Principal | ICD-10-CM

## 2024-02-25 DIAGNOSIS — N185 Chronic kidney disease, stage 5: Principal | ICD-10-CM

## 2024-02-25 DIAGNOSIS — D631 Anemia in chronic kidney disease: Principal | ICD-10-CM

## 2024-03-03 DIAGNOSIS — Z940 Kidney transplant status: Principal | ICD-10-CM

## 2024-03-03 DIAGNOSIS — Z79899 Other long term (current) drug therapy: Principal | ICD-10-CM

## 2024-03-15 ENCOUNTER — Encounter: Admit: 2024-03-15 | Discharge: 2024-03-16 | Payer: MEDICARE

## 2024-03-24 DIAGNOSIS — D631 Anemia in chronic kidney disease: Principal | ICD-10-CM

## 2024-03-24 DIAGNOSIS — N185 Chronic kidney disease, stage 5: Principal | ICD-10-CM

## 2024-03-31 DIAGNOSIS — Z940 Kidney transplant status: Principal | ICD-10-CM

## 2024-03-31 DIAGNOSIS — Z79899 Other long term (current) drug therapy: Principal | ICD-10-CM

## 2024-04-12 ENCOUNTER — Encounter: Admit: 2024-04-12 | Discharge: 2024-04-13 | Payer: MEDICARE

## 2024-04-12 ENCOUNTER — Encounter: Admit: 2024-04-12 | Discharge: 2024-04-13 | Payer: MEDICARE | Attending: Nephrology | Primary: Nephrology

## 2024-04-12 DIAGNOSIS — Z940 Kidney transplant status: Principal | ICD-10-CM

## 2024-04-12 DIAGNOSIS — Z79899 Other long term (current) drug therapy: Principal | ICD-10-CM

## 2024-04-13 DIAGNOSIS — Z940 Kidney transplant status: Principal | ICD-10-CM

## 2024-04-15 DIAGNOSIS — Z940 Kidney transplant status: Principal | ICD-10-CM

## 2024-04-27 DIAGNOSIS — Z940 Kidney transplant status: Principal | ICD-10-CM

## 2024-04-27 DIAGNOSIS — Z79899 Other long term (current) drug therapy: Principal | ICD-10-CM

## 2024-04-28 DIAGNOSIS — D631 Anemia in chronic kidney disease: Principal | ICD-10-CM

## 2024-04-28 DIAGNOSIS — N185 Chronic kidney disease, stage 5: Principal | ICD-10-CM

## 2024-05-10 ENCOUNTER — Encounter: Admit: 2024-05-10 | Discharge: 2024-05-10 | Payer: MEDICARE

## 2024-05-10 ENCOUNTER — Encounter: Admit: 2024-05-10 | Discharge: 2024-05-10 | Payer: MEDICARE | Attending: Nephrology | Primary: Nephrology

## 2024-05-10 DIAGNOSIS — Z940 Kidney transplant status: Principal | ICD-10-CM

## 2024-05-10 DIAGNOSIS — R799 Abnormal finding of blood chemistry, unspecified: Principal | ICD-10-CM

## 2024-05-11 DIAGNOSIS — Z940 Kidney transplant status: Principal | ICD-10-CM

## 2024-05-13 DIAGNOSIS — Z940 Kidney transplant status: Principal | ICD-10-CM

## 2024-05-23 DIAGNOSIS — Z940 Kidney transplant status: Principal | ICD-10-CM

## 2024-05-26 DIAGNOSIS — N185 Chronic kidney disease, stage 5: Principal | ICD-10-CM

## 2024-05-26 DIAGNOSIS — D631 Anemia in chronic kidney disease: Principal | ICD-10-CM

## 2024-06-02 DIAGNOSIS — Z940 Kidney transplant status: Principal | ICD-10-CM

## 2024-06-02 DIAGNOSIS — Z79899 Other long term (current) drug therapy: Principal | ICD-10-CM

## 2024-06-07 ENCOUNTER — Encounter: Admit: 2024-06-07 | Discharge: 2024-06-08 | Payer: MEDICARE | Attending: Nephrology | Primary: Nephrology

## 2024-06-07 ENCOUNTER — Encounter: Admit: 2024-06-07 | Discharge: 2024-06-08 | Payer: MEDICARE

## 2024-06-07 DIAGNOSIS — N185 Chronic kidney disease, stage 5: Principal | ICD-10-CM

## 2024-06-07 DIAGNOSIS — D631 Anemia in chronic kidney disease: Principal | ICD-10-CM

## 2024-06-07 DIAGNOSIS — Z940 Kidney transplant status: Principal | ICD-10-CM

## 2024-06-07 DIAGNOSIS — Z79899 Other long term (current) drug therapy: Principal | ICD-10-CM

## 2024-06-07 DIAGNOSIS — R799 Abnormal finding of blood chemistry, unspecified: Principal | ICD-10-CM

## 2024-06-08 DIAGNOSIS — Z940 Kidney transplant status: Principal | ICD-10-CM

## 2024-06-13 DIAGNOSIS — Z940 Kidney transplant status: Principal | ICD-10-CM

## 2024-06-30 DIAGNOSIS — D631 Anemia in chronic kidney disease: Principal | ICD-10-CM

## 2024-06-30 DIAGNOSIS — N185 Chronic kidney disease, stage 5: Principal | ICD-10-CM

## 2024-06-30 DIAGNOSIS — Z79899 Other long term (current) drug therapy: Principal | ICD-10-CM

## 2024-06-30 DIAGNOSIS — Z940 Kidney transplant status: Principal | ICD-10-CM

## 2024-07-05 ENCOUNTER — Encounter: Admit: 2024-07-05 | Discharge: 2024-07-06 | Payer: MEDICARE

## 2024-07-05 DIAGNOSIS — D508 Other iron deficiency anemias: Principal | ICD-10-CM

## 2024-07-05 DIAGNOSIS — Z940 Kidney transplant status: Principal | ICD-10-CM

## 2024-07-09 DIAGNOSIS — Z940 Kidney transplant status: Principal | ICD-10-CM

## 2024-07-10 DIAGNOSIS — Z940 Kidney transplant status: Principal | ICD-10-CM

## 2024-07-12 DIAGNOSIS — Z940 Kidney transplant status: Principal | ICD-10-CM

## 2024-07-12 MED ORDER — MYCOPHENOLATE MOFETIL 500 MG TABLET
ORAL_TABLET | Freq: Two times a day (BID) | ORAL | 3 refills | 90.00000 days | Status: CP
Start: 2024-07-12 — End: 2025-07-12

## 2024-07-28 DIAGNOSIS — D631 Anemia in chronic kidney disease: Principal | ICD-10-CM

## 2024-07-28 DIAGNOSIS — N185 Chronic kidney disease, stage 5: Principal | ICD-10-CM

## 2024-07-28 DIAGNOSIS — Z940 Kidney transplant status: Principal | ICD-10-CM

## 2024-07-28 DIAGNOSIS — Z79899 Other long term (current) drug therapy: Principal | ICD-10-CM

## 2024-07-30 DIAGNOSIS — Z940 Kidney transplant status: Principal | ICD-10-CM

## 2024-07-30 DIAGNOSIS — R8279 Other abnormal findings on microbiological examination of urine: Principal | ICD-10-CM

## 2024-07-30 DIAGNOSIS — R799 Abnormal finding of blood chemistry, unspecified: Principal | ICD-10-CM

## 2024-08-01 ENCOUNTER — Inpatient Hospital Stay: Admit: 2024-08-01 | Discharge: 2024-08-01 | Payer: MEDICARE

## 2024-08-01 ENCOUNTER — Ambulatory Visit: Admit: 2024-08-01 | Discharge: 2024-08-01 | Payer: MEDICARE | Attending: Nephrology | Primary: Nephrology

## 2024-08-01 DIAGNOSIS — D84821 Immunosuppression due to drug therapy (HHS-HCC): Principal | ICD-10-CM

## 2024-08-01 DIAGNOSIS — Z79899 Other long term (current) drug therapy: Principal | ICD-10-CM

## 2024-08-01 DIAGNOSIS — N185 Chronic kidney disease, stage 5: Principal | ICD-10-CM

## 2024-08-01 DIAGNOSIS — Z940 Kidney transplant status: Principal | ICD-10-CM

## 2024-08-01 DIAGNOSIS — D631 Anemia in chronic kidney disease: Principal | ICD-10-CM

## 2024-08-02 ENCOUNTER — Encounter: Admit: 2024-08-02 | Discharge: 2024-08-03 | Payer: MEDICARE

## 2024-08-07 ENCOUNTER — Encounter: Admit: 2024-08-07 | Discharge: 2024-08-08 | Payer: MEDICARE

## 2024-08-15 DIAGNOSIS — Z79899 Other long term (current) drug therapy: Principal | ICD-10-CM

## 2024-08-15 DIAGNOSIS — Z940 Kidney transplant status: Principal | ICD-10-CM

## 2024-08-15 MED ORDER — CALCITRIOL 0.25 MCG CAPSULE
ORAL_CAPSULE | Freq: Every day | ORAL | 3 refills | 90.00000 days | Status: CP
Start: 2024-08-15 — End: ?

## 2024-08-15 MED ORDER — PREDNISONE 2.5 MG TABLET
ORAL_TABLET | Freq: Every day | ORAL | 3 refills | 90.00000 days | Status: CP
Start: 2024-08-15 — End: ?

## 2024-08-19 ENCOUNTER — Encounter: Admit: 2024-08-19 | Discharge: 2024-08-20 | Payer: MEDICARE

## 2024-08-25 DIAGNOSIS — N185 Chronic kidney disease, stage 5: Principal | ICD-10-CM

## 2024-08-25 DIAGNOSIS — D631 Anemia in chronic kidney disease: Principal | ICD-10-CM

## 2024-08-25 DIAGNOSIS — Z79899 Other long term (current) drug therapy: Principal | ICD-10-CM

## 2024-08-25 DIAGNOSIS — Z940 Kidney transplant status: Principal | ICD-10-CM

## 2024-08-30 ENCOUNTER — Encounter: Admit: 2024-08-30 | Discharge: 2024-08-31 | Payer: MEDICARE

## 2024-09-11 DIAGNOSIS — Z940 Kidney transplant status: Principal | ICD-10-CM

## 2024-09-27 ENCOUNTER — Encounter: Admit: 2024-09-27 | Discharge: 2024-09-28 | Payer: MEDICARE

## 2024-09-27 DIAGNOSIS — E876 Hypokalemia: Principal | ICD-10-CM

## 2024-09-27 DIAGNOSIS — Z940 Kidney transplant status: Principal | ICD-10-CM

## 2024-09-27 MED ORDER — POTASSIUM CHLORIDE ER 20 MEQ TABLET,EXTENDED RELEASE(PART/CRYST)
ORAL_TABLET | Freq: Every day | ORAL | 11 refills | 30.00000 days | Status: CP
Start: 2024-09-27 — End: 2025-09-27

## 2024-09-30 DIAGNOSIS — Z940 Kidney transplant status: Principal | ICD-10-CM

## 2024-10-25 ENCOUNTER — Encounter: Admit: 2024-10-25 | Discharge: 2024-10-26 | Payer: MEDICARE

## 2024-10-27 DIAGNOSIS — N185 Chronic kidney disease, stage 5: Principal | ICD-10-CM

## 2024-10-27 DIAGNOSIS — D631 Anemia in chronic kidney disease: Principal | ICD-10-CM

## 2024-11-12 DIAGNOSIS — Z940 Kidney transplant status: Principal | ICD-10-CM

## 2024-11-12 DIAGNOSIS — R609 Edema, unspecified: Principal | ICD-10-CM

## 2024-11-12 MED ORDER — CHLORTHALIDONE 25 MG TABLET
ORAL_TABLET | Freq: Every day | ORAL | 3 refills | 90.00000 days | Status: CP
Start: 2024-11-12 — End: ?

## 2024-11-24 DIAGNOSIS — D631 Anemia in chronic kidney disease: Secondary | ICD-10-CM

## 2024-11-24 DIAGNOSIS — N185 Chronic kidney disease, stage 5: Principal | ICD-10-CM
